# Patient Record
Sex: Female | Born: 1940
Health system: Southern US, Community
[De-identification: ages and names within clinical notes are randomized; demographics above are authoritative.]

## PROBLEM LIST (undated history)

## (undated) DIAGNOSIS — F0390 Unspecified dementia without behavioral disturbance: Secondary | ICD-10-CM

## (undated) DIAGNOSIS — K5790 Diverticulosis of intestine, part unspecified, without perforation or abscess without bleeding: Secondary | ICD-10-CM

## (undated) DIAGNOSIS — I10 Essential (primary) hypertension: Secondary | ICD-10-CM

## (undated) DIAGNOSIS — K559 Vascular disorder of intestine, unspecified: Secondary | ICD-10-CM

## (undated) DIAGNOSIS — N189 Chronic kidney disease, unspecified: Secondary | ICD-10-CM

## (undated) DIAGNOSIS — I639 Cerebral infarction, unspecified: Secondary | ICD-10-CM

## (undated) DIAGNOSIS — R4701 Aphasia: Secondary | ICD-10-CM

## (undated) DIAGNOSIS — I482 Chronic atrial fibrillation, unspecified: Secondary | ICD-10-CM

## (undated) DIAGNOSIS — I509 Heart failure, unspecified: Secondary | ICD-10-CM

## (undated) DIAGNOSIS — I519 Heart disease, unspecified: Secondary | ICD-10-CM

## (undated) DIAGNOSIS — N179 Acute kidney failure, unspecified: Secondary | ICD-10-CM

## (undated) DIAGNOSIS — E669 Obesity, unspecified: Secondary | ICD-10-CM

## (undated) DIAGNOSIS — D735 Infarction of spleen: Secondary | ICD-10-CM

## (undated) HISTORY — DX: Infarction of spleen: D73.5

## (undated) HISTORY — DX: Heart disease, unspecified: I51.9

## (undated) HISTORY — DX: Chronic atrial fibrillation, unspecified: I48.20

## (undated) HISTORY — DX: Acute kidney failure, unspecified: N17.9

## (undated) HISTORY — PX: ILEOSTOMY: SHX1783

## (undated) HISTORY — DX: Vascular disorder of intestine, unspecified: K55.9

## (undated) HISTORY — DX: Aphasia: R47.01

## (undated) HISTORY — PX: OMENTECTOMY: SHX2098

## (undated) HISTORY — DX: Obesity, unspecified: E66.9

## (undated) HISTORY — DX: Essential (primary) hypertension: I10

## (undated) HISTORY — DX: Chronic kidney disease, unspecified: N18.9

## (undated) HISTORY — DX: Diverticulosis of intestine, part unspecified, without perforation or abscess without bleeding: K57.90

## (undated) HISTORY — DX: Cerebral infarction, unspecified: I63.9

---

## 2000-05-22 ENCOUNTER — Encounter: Payer: Self-pay | Admitting: Family Medicine

## 2000-05-22 ENCOUNTER — Encounter: Admission: RE | Admit: 2000-05-22 | Discharge: 2000-05-22 | Payer: Self-pay | Admitting: *Deleted

## 2001-05-29 ENCOUNTER — Encounter: Admission: RE | Admit: 2001-05-29 | Discharge: 2001-05-29 | Payer: Self-pay | Admitting: *Deleted

## 2001-05-29 ENCOUNTER — Encounter: Payer: Self-pay | Admitting: *Deleted

## 2002-05-31 ENCOUNTER — Encounter: Admission: RE | Admit: 2002-05-31 | Discharge: 2002-05-31 | Payer: Self-pay | Admitting: *Deleted

## 2002-05-31 ENCOUNTER — Encounter: Payer: Self-pay | Admitting: *Deleted

## 2003-08-20 ENCOUNTER — Encounter: Payer: Self-pay | Admitting: *Deleted

## 2003-08-20 ENCOUNTER — Encounter: Admission: RE | Admit: 2003-08-20 | Discharge: 2003-08-20 | Payer: Self-pay | Admitting: *Deleted

## 2004-09-17 ENCOUNTER — Ambulatory Visit (HOSPITAL_COMMUNITY): Admission: RE | Admit: 2004-09-17 | Discharge: 2004-09-17 | Payer: Self-pay | Admitting: *Deleted

## 2005-04-07 ENCOUNTER — Other Ambulatory Visit: Admission: RE | Admit: 2005-04-07 | Discharge: 2005-04-07 | Payer: Self-pay | Admitting: *Deleted

## 2005-10-14 ENCOUNTER — Ambulatory Visit (HOSPITAL_COMMUNITY): Admission: RE | Admit: 2005-10-14 | Discharge: 2005-10-14 | Payer: Self-pay | Admitting: *Deleted

## 2006-10-25 ENCOUNTER — Ambulatory Visit (HOSPITAL_COMMUNITY): Admission: RE | Admit: 2006-10-25 | Discharge: 2006-10-25 | Payer: Self-pay | Admitting: *Deleted

## 2007-02-23 ENCOUNTER — Inpatient Hospital Stay (HOSPITAL_COMMUNITY): Admission: EM | Admit: 2007-02-23 | Discharge: 2007-03-22 | Payer: Self-pay | Admitting: Family Medicine

## 2007-02-23 ENCOUNTER — Ambulatory Visit: Payer: Self-pay | Admitting: Pulmonary Disease

## 2007-02-25 ENCOUNTER — Encounter (INDEPENDENT_AMBULATORY_CARE_PROVIDER_SITE_OTHER): Payer: Self-pay | Admitting: *Deleted

## 2007-02-28 ENCOUNTER — Encounter (INDEPENDENT_AMBULATORY_CARE_PROVIDER_SITE_OTHER): Payer: Self-pay | Admitting: *Deleted

## 2007-03-09 ENCOUNTER — Encounter (INDEPENDENT_AMBULATORY_CARE_PROVIDER_SITE_OTHER): Payer: Self-pay | Admitting: *Deleted

## 2007-03-21 ENCOUNTER — Ambulatory Visit: Payer: Self-pay | Admitting: Vascular Surgery

## 2007-03-21 ENCOUNTER — Encounter: Payer: Self-pay | Admitting: Vascular Surgery

## 2007-08-27 ENCOUNTER — Inpatient Hospital Stay (HOSPITAL_COMMUNITY): Admission: RE | Admit: 2007-08-27 | Discharge: 2007-09-01 | Payer: Self-pay | Admitting: General Surgery

## 2007-08-27 ENCOUNTER — Encounter (INDEPENDENT_AMBULATORY_CARE_PROVIDER_SITE_OTHER): Payer: Self-pay | Admitting: General Surgery

## 2007-11-19 ENCOUNTER — Ambulatory Visit (HOSPITAL_COMMUNITY): Admission: RE | Admit: 2007-11-19 | Discharge: 2007-11-19 | Payer: Self-pay | Admitting: *Deleted

## 2007-12-12 ENCOUNTER — Other Ambulatory Visit: Admission: RE | Admit: 2007-12-12 | Discharge: 2007-12-12 | Payer: Self-pay | Admitting: Family Medicine

## 2008-11-19 ENCOUNTER — Ambulatory Visit (HOSPITAL_COMMUNITY): Admission: RE | Admit: 2008-11-19 | Discharge: 2008-11-19 | Payer: Self-pay | Admitting: Family Medicine

## 2009-11-30 ENCOUNTER — Ambulatory Visit (HOSPITAL_COMMUNITY): Admission: RE | Admit: 2009-11-30 | Discharge: 2009-11-30 | Payer: Self-pay | Admitting: Family Medicine

## 2010-02-08 ENCOUNTER — Other Ambulatory Visit: Admission: RE | Admit: 2010-02-08 | Discharge: 2010-02-08 | Payer: Self-pay | Admitting: Family Medicine

## 2010-11-14 DIAGNOSIS — R4701 Aphasia: Secondary | ICD-10-CM

## 2010-11-14 HISTORY — DX: Aphasia: R47.01

## 2010-12-09 ENCOUNTER — Ambulatory Visit (HOSPITAL_COMMUNITY)
Admission: RE | Admit: 2010-12-09 | Discharge: 2010-12-09 | Payer: Self-pay | Source: Home / Self Care | Attending: Family Medicine | Admitting: Family Medicine

## 2010-12-15 DIAGNOSIS — R4701 Aphasia: Secondary | ICD-10-CM

## 2010-12-15 DIAGNOSIS — I639 Cerebral infarction, unspecified: Secondary | ICD-10-CM

## 2010-12-15 HISTORY — DX: Cerebral infarction, unspecified: I63.9

## 2010-12-15 HISTORY — DX: Aphasia: R47.01

## 2010-12-15 HISTORY — PX: COLECTOMY: SHX59

## 2010-12-25 ENCOUNTER — Emergency Department (HOSPITAL_COMMUNITY): Payer: Medicare Other

## 2010-12-25 ENCOUNTER — Inpatient Hospital Stay (HOSPITAL_COMMUNITY)
Admission: EM | Admit: 2010-12-25 | Discharge: 2010-12-28 | DRG: 062 | Disposition: A | Discharge status: Home / Self Care | Payer: Medicare Other | Attending: Neurology | Admitting: Neurology

## 2010-12-25 DIAGNOSIS — I10 Essential (primary) hypertension: Secondary | ICD-10-CM | POA: Diagnosis present

## 2010-12-25 DIAGNOSIS — Z7982 Long term (current) use of aspirin: Secondary | ICD-10-CM

## 2010-12-25 DIAGNOSIS — I4891 Unspecified atrial fibrillation: Secondary | ICD-10-CM | POA: Diagnosis present

## 2010-12-25 DIAGNOSIS — K559 Vascular disorder of intestine, unspecified: Secondary | ICD-10-CM | POA: Diagnosis present

## 2010-12-25 DIAGNOSIS — R4701 Aphasia: Secondary | ICD-10-CM | POA: Diagnosis present

## 2010-12-25 DIAGNOSIS — I6350 Cerebral infarction due to unspecified occlusion or stenosis of unspecified cerebral artery: Principal | ICD-10-CM | POA: Diagnosis present

## 2010-12-25 DIAGNOSIS — E669 Obesity, unspecified: Secondary | ICD-10-CM | POA: Diagnosis present

## 2010-12-25 LAB — COMPREHENSIVE METABOLIC PANEL
ALT: 17 U/L (ref 0–35)
AST: 25 U/L (ref 0–37)
Albumin: 3.6 g/dL (ref 3.5–5.2)
CO2: 26 mEq/L (ref 19–32)
Calcium: 9.1 mg/dL (ref 8.4–10.5)
Chloride: 104 mEq/L (ref 96–112)
GFR calc Af Amer: 44 mL/min — ABNORMAL LOW (ref >60)
GFR calc non Af Amer: 36 mL/min — ABNORMAL LOW (ref >60)
Sodium: 138 mEq/L (ref 135–145)
Total Bilirubin: 0.3 mg/dL (ref 0.3–1.2)

## 2010-12-25 LAB — GLUCOSE, CAPILLARY

## 2010-12-25 LAB — POCT I-STAT, CHEM 8
BUN: 22 mg/dL (ref 6–23)
Calcium, Ion: 1.04 mmol/L — ABNORMAL LOW (ref 1.12–1.32)
Chloride: 108 mEq/L (ref 96–112)
Creatinine, Ser: 1.5 mg/dL — ABNORMAL HIGH (ref 0.4–1.2)
Glucose, Bld: 112 mg/dL — ABNORMAL HIGH (ref 70–99)
HCT: 42 % (ref 36.0–46.0)

## 2010-12-25 LAB — DIFFERENTIAL
Eosinophils Absolute: 0.2 10*3/uL (ref 0.0–0.7)
Eosinophils Relative: 2 % (ref 0–5)
Lymphs Abs: 2.8 10*3/uL (ref 0.7–4.0)
Monocytes Absolute: 1.1 10*3/uL — ABNORMAL HIGH (ref 0.1–1.0)
Monocytes Relative: 14 % — ABNORMAL HIGH (ref 3–12)

## 2010-12-25 LAB — RAPID URINE DRUG SCREEN, HOSP PERFORMED
Barbiturates: NOT DETECTED
Cocaine: NOT DETECTED
Opiates: NOT DETECTED
Tetrahydrocannabinol: NOT DETECTED

## 2010-12-25 LAB — URINALYSIS, ROUTINE W REFLEX MICROSCOPIC
Bilirubin Urine: NEGATIVE
Nitrite: NEGATIVE
Specific Gravity, Urine: 1.025 (ref 1.005–1.030)
Urobilinogen, UA: 1 mg/dL (ref 0.0–1.0)
pH: 5 (ref 5.0–8.0)

## 2010-12-25 LAB — CBC
MCH: 32.8 pg (ref 26.0–34.0)
MCV: 99.8 fL (ref 78.0–100.0)
Platelets: 193 10*3/uL (ref 150–400)
RDW: 13.5 % (ref 11.5–15.5)

## 2010-12-25 LAB — CK TOTAL AND CKMB (NOT AT ARMC): Relative Index: 1.4 (ref 0.0–2.5)

## 2010-12-26 ENCOUNTER — Inpatient Hospital Stay (HOSPITAL_COMMUNITY): Payer: Medicare Other

## 2010-12-26 LAB — COMPREHENSIVE METABOLIC PANEL
ALT: 17 U/L (ref 0–35)
AST: 34 U/L (ref 0–37)
Alkaline Phosphatase: 143 U/L — ABNORMAL HIGH (ref 39–117)
BUN: 14 mg/dL (ref 6–23)
CO2: 24 mEq/L (ref 19–32)
CO2: 25 mEq/L (ref 19–32)
Calcium: 8.9 mg/dL (ref 8.4–10.5)
Calcium: 9 mg/dL (ref 8.4–10.5)
Chloride: 108 mEq/L (ref 96–112)
Creatinine, Ser: 0.97 mg/dL (ref 0.4–1.2)
GFR calc Af Amer: 45 mL/min — ABNORMAL LOW (ref >60)
GFR calc non Af Amer: 37 mL/min — ABNORMAL LOW (ref >60)
GFR calc non Af Amer: 57 mL/min — ABNORMAL LOW (ref >60)
Glucose, Bld: 120 mg/dL — ABNORMAL HIGH (ref 70–99)
Glucose, Bld: 98 mg/dL (ref 70–99)
Potassium: 4.1 mEq/L (ref 3.5–5.1)
Sodium: 140 mEq/L (ref 135–145)
Total Bilirubin: 0.6 mg/dL (ref 0.3–1.2)
Total Protein: 6.9 g/dL (ref 6.0–8.3)

## 2010-12-26 LAB — CBC
HCT: 41.4 % (ref 36.0–46.0)
HCT: 42.3 % (ref 36.0–46.0)
Hemoglobin: 13.6 g/dL (ref 12.0–15.0)
Hemoglobin: 14 g/dL (ref 12.0–15.0)
MCH: 32.9 pg (ref 26.0–34.0)
MCH: 32.9 pg (ref 26.0–34.0)
MCHC: 32.9 g/dL (ref 30.0–36.0)
MCHC: 33.1 g/dL (ref 30.0–36.0)
MCV: 100.2 fL — ABNORMAL HIGH (ref 78.0–100.0)
RDW: 13.6 % (ref 11.5–15.5)
RDW: 13.7 % (ref 11.5–15.5)

## 2010-12-26 LAB — TSH: TSH: 2.781 u[IU]/mL (ref 0.350–4.500)

## 2010-12-26 LAB — LIPID PANEL
Cholesterol: 142 mg/dL (ref 0–200)
HDL: 52 mg/dL (ref >39)
Total CHOL/HDL Ratio: 2.7 RATIO
VLDL: 14 mg/dL (ref 0–40)

## 2010-12-26 LAB — CK TOTAL AND CKMB (NOT AT ARMC): Total CK: 181 U/L — ABNORMAL HIGH (ref 7–177)

## 2010-12-26 LAB — PROTIME-INR: INR: 5.99 (ref 0.00–1.49)

## 2010-12-26 LAB — HEMOGLOBIN A1C
Hgb A1c MFr Bld: 6 % — ABNORMAL HIGH (ref <5.7)
Mean Plasma Glucose: 126 mg/dL — ABNORMAL HIGH (ref <117)

## 2010-12-26 LAB — ETHANOL: Alcohol, Ethyl (B): <5 mg/dL (ref 0–10)

## 2010-12-26 LAB — DIGOXIN LEVEL: Digoxin Level: <0.2 ng/mL — ABNORMAL LOW (ref 0.8–2.0)

## 2010-12-27 ENCOUNTER — Inpatient Hospital Stay (HOSPITAL_COMMUNITY): Payer: Medicare Other

## 2010-12-28 LAB — PROTIME-INR: Prothrombin Time: 13.9 seconds (ref 11.6–15.2)

## 2010-12-28 NOTE — H&P (Signed)
NAME:  Joanna [name] NO.:  [id number]  MEDICAL RECORD NO.:  [id number]           PATIENT TYPE:  I  LOCATION:  2901                         FACILITY:  [facility]  PHYSICIAN:  Marlan Palau, M.D.  DATE OF BIRTH:  May 28, 1941  DATE OF ADMISSION:  12/25/2010 DATE OF DISCHARGE:                             HISTORY & PHYSICAL   HISTORY OF PRESENT ILLNESS:  Joanna [name] is a 70 year old right- handed black female born on [birth year], with a history of atrial fibrillation, on aspirin only.  This patient comes to the [facility] after having a witnessed event while at work tonight at 9 p.m.  The patient was helping to transfer a patient and then suddenly slumped over.  The patient was noted to have speech disorder from that time on.  The patient was brought to the emergency room for an evaluation and appeared to have good strength in all fours, but had a severe dysnomia.  CT scan of the brain was done showing an old left frontal white matter ischemic event, but no acute changes were seen. The patient appears to have an NIH stroke scale score of 4.  The patient is being admitted to [facility] following full-dose IV tPA.  PAST MEDICAL HISTORY:  Significant for: 1. New onset of aphasia. 2. Atrial fibrillation, not on Coumadin. 3. History of ischemic colitis, requiring partial colectomy and     ileostomy with subsequent reversal of the ileostomy. 4. Hypertension. 5. Obesity.  MEDICATIONS: 1. Aspirin 325 mg daily. 2. Metoprolol 100 mg twice daily. 3. Lasix 80 mg daily. 4. Digoxin 0.125 mg daily.  ALLERGIES:  The patient has no known allergies.  SOCIAL HISTORY:  The patient is widowed, has 3 children who are alive and well.  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  The patient again has 3 children and 1 daughter who died with HIV infection.  She does not smoke or drink.  FAMILY MEDICAL HISTORY:  Notable that  mother died with heart disease. Cause of death of father is unknown.  The patient has two sisters, one with hypertension.  There is no history of cancer or diabetes in the family.  REVIEW OF SYSTEMS:  Notable that the patient does not report any headache, dizziness, chest pains, shortness of breath, abdominal pain, nausea, vomiting, troubles controlling the bowels or bladder.  The patient does not report numbness or weakness on the arms or legs.  PHYSICAL EXAMINATION:  VITAL SIGNS:  Blood pressure currently is 146/88, heart rate 83, and respiratory rate 20.  The patient is afebrile. GENERAL:  This patient is a minimally to moderately obese black female who is alert at the time of the examination. HEENT:  Head is atraumatic.  Eyes, pupils are round and reactive to light, equal. NECK:  Supple.  No carotid bruits noted. RESPIRATORY:  Clear. CARDIOVASCULAR:  Occasionally irregular heart rhythm without obvious murmurs or rubs noted. EXTREMITIES:  Without significant edema. ABDOMEN:  Positive bowel sounds.  No organomegaly or tenderness noted. NEUROLOGIC:  Cranial nerves as above.  Facial symmetry is present.  The patient has good sensation of face to pinprick and soft touch bilaterally.  She has good strength of facial muscle, muscles with head turning and shoulder shrug bilaterally.  Speech is well enunciated, but the patient has a fairly significant dysnomia.  The patient cannot read, cannot name objects, cannot repeat.  Again, motor testing reveals normal strength in all fours.  No drift is seen on the arms or legs.  The patient has good pinprick sensation on all fours.  The patient appears to have full extraocular movements.  Deep tendon reflexes are symmetric and normal.  Toes are downgoing bilaterally.  The patient has fair finger-nose-finger and heel-to-shin bilaterally.  Gait was not tested.  LABORATORY VALUES:  Notable for white count of 7.8, hemoglobin of 13.4, hematocrit of  40.7, MCV of 99.8, and platelets of 193.  INR of 0.97, sodium of 138, potassium of 4.0, chloride of 104, CO2 of 26, glucose of 113, BUN of 19, creatinine of 1.43, alk phosphatase of 137, SGOT of 25, SGPT of 17, total protein of 7.6, albumin of 3.6, calcium of 9.1.  CK of 162, MB fraction of 2.2, and troponin I of 0.01.  IMPRESSION: 1. New onset of dysnomia/aphasia. 2. Atrial fibrillation. 3. Hypertension.  This patient has not been on anticoagulant medications with her atrial fibrillation.  The patient has dysnomia that is fairly significant.  The patient has no hemiparesis or visual field deficits.  The patient will be brought into the hospital following full-dose IV tPA and will be admitted to the Intensive Care Unit.  The patient will undergo an MRI of the head, MRA of the head, 2-D echocardiogram, and carotid Doppler study.  May consider use of anticoagulants in the future.  I did not see this patient has any clear contraindications to this.     Marlan Palau, M.D.     CKW/MEDQ  D:  12/25/2010  T:  12/26/2010  Job:  [id number]  cc:   [facility]  Electronically Signed by Thana Farr M.D. on 12/28/2010 10:02:58 AM

## 2010-12-31 ENCOUNTER — Other Ambulatory Visit: Payer: Medicare Other

## 2010-12-31 ENCOUNTER — Other Ambulatory Visit (INDEPENDENT_AMBULATORY_CARE_PROVIDER_SITE_OTHER): Payer: Medicare Other

## 2010-12-31 DIAGNOSIS — I4891 Unspecified atrial fibrillation: Secondary | ICD-10-CM

## 2010-12-31 DIAGNOSIS — Z7901 Long term (current) use of anticoagulants: Secondary | ICD-10-CM

## 2011-01-07 ENCOUNTER — Other Ambulatory Visit (INDEPENDENT_AMBULATORY_CARE_PROVIDER_SITE_OTHER): Payer: Medicare Other

## 2011-01-07 DIAGNOSIS — Z7901 Long term (current) use of anticoagulants: Secondary | ICD-10-CM

## 2011-01-07 DIAGNOSIS — I4891 Unspecified atrial fibrillation: Secondary | ICD-10-CM

## 2011-01-13 NOTE — Discharge Summary (Signed)
NAME:  Joanna [name], Joanna [name]              ACCOUNT NO.:  [id number]  MEDICAL RECORD NO.:  [id number]           PATIENT TYPE:  I  LOCATION:  3032                         FACILITY:  [facility]  PHYSICIAN:  Duy Lemming P. Pearlean Brownie, MD    DATE OF BIRTH:  08/22/1941  DATE OF ADMISSION:  12/25/2010 DATE OF DISCHARGE:  12/28/2010                              DISCHARGE SUMMARY   DIAGNOSES AT TIME OF DISCHARGE: 1. Small left middle cerebral artery branch infarct not seen on MRI,     status post full-dose IV t-PA. 2. Atrial fibrillation. 3. Ischemic colitis requiring partial colectomy and ileostomy with     subsequent reversal of the ileostomy. 4. Hypertension. 5. Obesity.  MEDICATIONS AT TIME OF DISCHARGE: 1. Aspirin 325 mg until Coumadin therapeutic, then discontinue. 2. Warfarin 5 mg q.p.m., adjust dose per Dr. Swaziland. 3. Digoxin 0.125 mg a day. 4. Lasix 80 mg a day. 5. Metoprolol 50 mg b.i.d. 6. Multivitamin one a day.  STUDIES PERFORMED: 1. CT of the brain on admission shows interval, but chronic left     subcortical parietal stroke.  No acute infarct or intracranial     hemorrhage.  Followup CT 24 hours after t-PA shows no significant     change.  No intracranial hemorrhage. 2. MRI of the brain shows no acute stroke.  Remote left frontal     subcortical white matter infarct. 3. MRA shows mild intracranial atherosclerotic changes. 4. Carotid Doppler shows no ICA stenosis. 5. A 2-D echocardiogram shows EF of 50-55% with no source of embolus. 6. EKG shows atrial fibrillation.  LABORATORY STUDIES:  Glucose 112, creatinine 1.5, otherwise chemistry normal, CBC normal, coagulation studies normal and liver function tests normal.  Cardiac enzymes negative.  Urine drug screen negative. Cholesterol 142, triglycerides 69, HDL 52, LDL 76.  Digoxin less than 0.2, hemoglobin A1c 6.0, alcohol level less than 5, TSH 2.781, INR day of discharge 1.05.  HISTORY OF PRESENT ILLNESS:  Joanna [name] is a  70 year old right- handed African American female with a history of atrial fibrillation on aspirin alone.  The patient presents to [facility] Room after having a witnessed event while at work at 9 p.m.  The patient was helping to transfer a patient when suddenly she slumped over.  The patient she was helping called for help.  Ms. Dona was noted to have a speech disorder from that time forward.  She was brought to the emergency room for evaluation.  She had good strength in all 4 extremities, but had a severe dysnomia.  CT of the brain showed an old left frontal white matter ischemic infarct with no acute changes.  The patient scored a 4 on the NIH stroke scale.  She was administered full- dose IV t-PA and admitted to the ICU.  HOSPITAL COURSE:  It is felt the patient had a small left MCA branch infarct not seen on MRI.  The cause of her infarct was atrial fibrillation.  As she has a history of atrial fibrillation not on Coumadin, it was decided to place her on warfarin for secondary stroke prevention.  No heparin or Lovenox  bridging is required.  Will  administer warfarin and follow up with Dr. Swaziland, her cardiologist, for outpatient management.  In hospital, she had workup.  She has had risk factors of hypertension and obesity.  Of note, she had a TEE in April 2008, where she was found to have a patent foramen ovale and right- to-left shunt.  The patient was also placed on aspirin.  Planned to bridge with aspirin and discontinue once Coumadin therapeutic.  She was evaluated by PT and OT and felt to have no outpatient needs.  She is safe for discharge home with family and will follow up with Dr. Swaziland in his office on Friday.  CONDITION AT TIME OF DISCHARGE:  The patient alert and oriented x3. Speech clear.  No aphasia.  She has no focal weakness.  Her gait is steady.  Heart rate is irregular.  Breath sounds are clear.  DISCHARGE PLAN: 1. Discharged home with family. 2.  Coumadin for secondary stroke prevention.  Bridge with aspirin     until INR 2.0, then discontinue.  Coumadin to be managed by Dr.     Peter Swaziland.  Has appointment on Friday, December 31, 2010, at     9:30 a.m. 3. Aspirin. 4. Follow up primary care physician within 85-month. 5. Follow up Dr. Delia Heady in his office in 2-3 months. 6. Consider IRIS trial.     Annie Main, N.P.   ______________________________ Sunny Schlein. Pearlean Brownie, MD    SB/MEDQ  D:  12/28/2010  T:  12/29/2010  Job:  [id number]  cc:   Cobie Leidner P. Pearlean Brownie, MD Peter M. Swaziland, M.D. [facility]  Electronically Signed by Annie Main N.P. on 01/03/2011 04:41:36 PM Electronically Signed by Delia Heady MD on 01/13/2011 01:18:29 PM

## 2011-01-14 ENCOUNTER — Ambulatory Visit (INDEPENDENT_AMBULATORY_CARE_PROVIDER_SITE_OTHER): Payer: Medicare Other | Admitting: Nurse Practitioner

## 2011-01-14 DIAGNOSIS — I6350 Cerebral infarction due to unspecified occlusion or stenosis of unspecified cerebral artery: Secondary | ICD-10-CM

## 2011-01-14 DIAGNOSIS — I4891 Unspecified atrial fibrillation: Secondary | ICD-10-CM

## 2011-01-14 DIAGNOSIS — I10 Essential (primary) hypertension: Secondary | ICD-10-CM

## 2011-01-14 DIAGNOSIS — E669 Obesity, unspecified: Secondary | ICD-10-CM

## 2011-01-14 DIAGNOSIS — Z7901 Long term (current) use of anticoagulants: Secondary | ICD-10-CM

## 2011-01-28 ENCOUNTER — Ambulatory Visit (INDEPENDENT_AMBULATORY_CARE_PROVIDER_SITE_OTHER): Payer: Medicare Other | Admitting: Nurse Practitioner

## 2011-01-28 DIAGNOSIS — G459 Transient cerebral ischemic attack, unspecified: Secondary | ICD-10-CM

## 2011-01-28 DIAGNOSIS — Z7901 Long term (current) use of anticoagulants: Secondary | ICD-10-CM

## 2011-01-28 DIAGNOSIS — I4891 Unspecified atrial fibrillation: Secondary | ICD-10-CM

## 2011-01-28 DIAGNOSIS — I10 Essential (primary) hypertension: Secondary | ICD-10-CM

## 2011-02-04 ENCOUNTER — Other Ambulatory Visit (INDEPENDENT_AMBULATORY_CARE_PROVIDER_SITE_OTHER): Payer: Medicare Other | Admitting: *Deleted

## 2011-02-04 DIAGNOSIS — I4891 Unspecified atrial fibrillation: Secondary | ICD-10-CM

## 2011-02-04 DIAGNOSIS — Z7901 Long term (current) use of anticoagulants: Secondary | ICD-10-CM

## 2011-02-04 DIAGNOSIS — Z79899 Other long term (current) drug therapy: Secondary | ICD-10-CM

## 2011-02-16 ENCOUNTER — Ambulatory Visit (INDEPENDENT_AMBULATORY_CARE_PROVIDER_SITE_OTHER): Payer: Medicare Other | Admitting: *Deleted

## 2011-02-16 DIAGNOSIS — Z7901 Long term (current) use of anticoagulants: Secondary | ICD-10-CM

## 2011-02-16 DIAGNOSIS — I4891 Unspecified atrial fibrillation: Secondary | ICD-10-CM

## 2011-02-16 LAB — POCT INR: INR: 1.7

## 2011-03-02 ENCOUNTER — Encounter: Payer: Medicare Other | Admitting: *Deleted

## 2011-03-02 ENCOUNTER — Ambulatory Visit (INDEPENDENT_AMBULATORY_CARE_PROVIDER_SITE_OTHER): Payer: Medicare Other | Admitting: *Deleted

## 2011-03-02 DIAGNOSIS — I4891 Unspecified atrial fibrillation: Secondary | ICD-10-CM

## 2011-03-02 LAB — POCT INR: INR: 3.5

## 2011-03-16 ENCOUNTER — Other Ambulatory Visit (INDEPENDENT_AMBULATORY_CARE_PROVIDER_SITE_OTHER): Payer: Medicare Other | Admitting: *Deleted

## 2011-03-16 DIAGNOSIS — I4891 Unspecified atrial fibrillation: Secondary | ICD-10-CM

## 2011-03-16 LAB — POCT INR: INR: 2.2

## 2011-03-29 NOTE — Op Note (Signed)
NAMEDAY, GREB NO.:  [id number]   MEDICAL RECORD NO.:  [id number]          PATIENT TYPE:  INP   LOCATION:  2899                         FACILITY:  [facility]   PHYSICIAN:  Gabrielle Dare. Janee Morn, M.D.DATE OF BIRTH:  Jul 28, 1941   DATE OF PROCEDURE:  08/27/2007  DATE OF DISCHARGE:                               OPERATIVE REPORT   PREOPERATIVE DIAGNOSIS:  Ileostomy.   POSTOPERATIVE DIAGNOSIS:  Ileostomy.   PROCEDURE:  1. Ileostomy takedown.  2. Lysis of adhesions for 30 minutes.   SURGEON:  Gabrielle Dare. Janee Morn, M.D.   ASSISTANT:  Currie Paris, M.D.   ANESTHESIA:  General.   HISTORY OF PRESENT ILLNESS:  The patient is a 70 year old African  American female who initially had an emergency right colectomy and  partial omentectomy with ileostomy in March 09, 2007 for necrotic  perforated right colon.  She has been doing well as an outpatient.  We  waited 6 months for hopefully her adhesions to decrease inside her  abdomen.  She underwent preoperative cardiac clearance with Dr. Reyes Ivan  and she presents today for elective takedown of her ileostomy. She  underwent a bowel prep which she tolerated well   PROCEDURE IN DETAIL:  Informed consent was obtained.  The patient was  identified in the preop holding area.  She received intravenous  antibiotics.  She was brought to the operating room.  General anesthesia  was administered.  Her ileostomy was then closed with running 2-0 silk  suture.  Her abdomen was subsequently prepped and draped in sterile  fashion.  Midline scar was excised.  Subcutaneous tissues were dissected  down to the anterior fascia.  This was carefully divided.  Old suture  was removed from the midline.  Peritoneal cavity was entered gradually  under direct vision as there were a large number of adhesions and  gradually freed up the fascia.  The fascia was divided the length of the  incision.  There was good deal of omental adhesions inside that  were  gradually taken down as well as some extensive adhesions from the small  bowel to the omentum and the colon.  These were methodically taken down  without any bowel injuries for total of 30 minutes.  Once these were  cleared, the NG tube was checked and adjusted and noted to be in good  position.  The ileostomy site from the inside was cleared away from the  adhesions at the abdominal wall.  The colon that remained was the  transverse colon and it was very viable and all the adhesions had been  cleared away.  The ileostomy site was then taken down with elliptical  incision from the skin.  Subcutaneous tissues were dissected  circumferentially around it, getting excellent hemostasis with Bovie  cautery.  The ileostomy was then brought back into the abdomen.  The  distal 5 cm or so was then divided and removed with the GIA-75 stapler.  The terminal ileum was pink and viable without inflammation.  We then  reconnected the bowel with a side-to-side anastomosis with a GIA-75  stapler after placing a  stay stitch with 2-0 silk.  Staple lines were  inspected and there was no bleeding.  The resultant enterotomy was then  closed with a TA-60 stapler, achieving excellent closure and good patent  anastomosis. An additional figure-of-eight 3-0 silk was placed along the  TA staple line.  We did get good hemostasis.  Anastomosis was widely  patent.  The rent in the mesentery was then closed with several  interrupted 2-0 silk sutures.  We changed our gloves.  The abdomen was  copiously irrigated.  Hemostasis was obtained in a couple places along  the omentum earlier in the case and these were rechecked and hemostasis  was assured.  The abdomen was further irrigated with warm saline.  The  irrigation fluid returned clear.  The irrigation fluid was evacuated.  The fascia of the ileostomy site was then closed with interrupted #1 PDS  suture, achieving good closure.  The midline fascia was then closed   after ensuring all counts were correct with #1 looped PDS, one from each  end of the incision and tied in the middle.  Subcutaneous tissues of the  ileostomy site in the midline were copiously irrigated.  Hemostasis was  again ensured and the skin of each was closed with staples.  Sponge,  needle and instrument counts were correct.  Sterile dressings were  applied.  The patient tolerated the procedure well without apparent  complication and was taken to recovery in stable condition.      Gabrielle Dare Janee Morn, M.D.  Electronically Signed     BET/MEDQ  D:  08/27/2007  T:  08/28/2007  Job:  [id number]   cc:   Cassell Clement, M.D.  Elmore Guise., M.D.  Evlyn Clines

## 2011-03-29 NOTE — Discharge Summary (Signed)
NAME:  Joanna [name], Joanna [name]              ACCOUNT NO.:  [id number]   MEDICAL RECORD NO.:  [id number]          PATIENT TYPE:  INP   LOCATION:  5736                         FACILITY:  [facility]   PHYSICIAN:  Gabrielle Dare. Janee Morn, M.D.DATE OF BIRTH:  25-Mar-1941   DATE OF ADMISSION:  08/27/2007  DATE OF DISCHARGE:  09/01/2007                               DISCHARGE SUMMARY   DISCHARGE DIAGNOSES:  1. Status post takedown of ileostomy.  2. Hypertension.   HISTORY OF PRESENT ILLNESS:  The patient is a 70 year old African  American female who initially had emergency right colectomy and partial  omentectomy with ileostomy in April 2008 for a necrotic perforated right  colon.  She presents for elective takedown of ileostomy.  She had  undergone a cardiac clearance.   HOSPITAL COURSE:  The patient underwent an uncomplicated ileostomy  takedown.  Postoperatively, she had the expected ileus.  Hypertension  was controlled with diltiazem and metoprolol.  Her ileus gradually  resolved.  NG tube was taken out postoperative day #1.  She continued  just on sips of water until bowel function returned with liquid bowel  movements by postoperative day #3.  She then tolerated gradual  advancement of her diet.  She remained afebrile and hemodynamically  stable throughout.  She continued to move her bowels and tolerated  advancement of her diet to soft.  Hypertension was controlled with her  diltiazem and metoprolol.  These were transitioned over to oral  medications and she is discharged home today on postoperative day #5 in  stable condition.   DISCHARGE DIET:  Soft.   DISCHARGE ACTIVITY:  No lifting.   DISCHARGE MEDICATIONS:  Percocet 5/325 one to two every 6 hours as  needed for pain.   In addition, she is continuing her home medications which consist of:  1. Metoprolol 100 mg p.o. b.i.d.  2. Os-Cal supplement.  3. Diltiazem 90 mg p.o. b.i.d.  4. Lasix 80 mg daily.  5. Centrum multivitamin.  6. Digoxin  0.125 mg daily.   Followup is with myself this coming week for staple removal.      Gabrielle Dare. Janee Morn, M.D.  Electronically Signed     BET/MEDQ  D:  09/01/2007  T:  09/03/2007  Job:  [id number]   cc:   Cassell Clement, M.D.  Evlyn Clines

## 2011-03-30 ENCOUNTER — Ambulatory Visit (INDEPENDENT_AMBULATORY_CARE_PROVIDER_SITE_OTHER): Payer: Medicare Other | Admitting: *Deleted

## 2011-03-30 DIAGNOSIS — I4891 Unspecified atrial fibrillation: Secondary | ICD-10-CM

## 2011-03-30 LAB — POCT INR: INR: 2

## 2011-04-01 NOTE — Discharge Summary (Signed)
NAME:  Joanna [name], Joanna [name] NO.:  [id number]   MEDICAL RECORD NO.:  [id number]          PATIENT TYPE:  INP   LOCATION:  3730                         FACILITY:  [facility]   PHYSICIAN:  Elmore Guise., M.D.DATE OF BIRTH:  February 22, 1941   DATE OF ADMISSION:  02/23/2007  DATE OF DISCHARGE:  03/22/2007                               DISCHARGE SUMMARY   DISCHARGE DIAGNOSES:  1. Atrial fibrillation.  2. Splenic infarct.  3. Acute renal failure, now with normalization back to baseline.  4. Ischemic colitis, status post emergent colectomy.  5. Gastrointestinal bleed, likely secondary to ischemic colitis.  6. Deconditioning.   HISTORY OF PRESENT ILLNESS AND HOSPITAL COURSE:  Ms. Swarey is a very  pleasant African American female who was admitted with abdominal  distention and atrial fibrillation back on April 11.  Her hospital  course was prolonged and complicated.  Because of her abdominal  distention, she underwent a CT of the abdomen and pelvis which showed  splenic infarct; this was felt secondary to embolic phenomenon from her  atrial fibrillation.  She was then placed on anticoagulation.  She was  rate-controlled with Cardizem drip, metoprolol and amiodarone and  actually converted to normal sinus rhythm after 2 days of IV  medications.  She did have elevation in her liver enzymes, which was  consistent with shock liver state.  All medications which would cause  hepatic insufficiency were stopped (Tylenol, amiodarone).  The patient also developed acute renal failure with increased BUN and  creatinine, requiring high-dose Lasix for volume improvement.  The  patient's status improved slowly over the next 5-6 days and she was  transferred from the CCU to the regular telemetry floor.  Her strength  improved slowly; however, she continued to have episodes of off-and-on  diarrhea and abdominal distention.  Her LFTs were normalizing.  She had  a significant GI bleed in the  setting of being on Coumadin and Lovenox.  Her anticoagulation was stopped and she was reversed with FFP.  Ultimately, she required over 6 units of FFP as well as 8 units of  packed red blood cells to maintain her hemoglobin.  Because of her  worsening mental status and her hemodynamic instability, she was  transferred to the ICU for aggressive medical care.  Critical Care  consult was obtained and because of the patient's work of breathing, she  was also intubated.  Her condition continued to remain labile.  Because  of continued abdominal discomfort, increased lactic acid and worsening  acidotic picture, she was taken to the operating room emergently for  emergent colectomy.  Postoperatively, she did well.  She was treated  with IV nutrition and TNA.  Her strength improved slowly.  After a 7-day  stay in the ICU, she was transferred back to telemetry monitoring.  She  was treated aggressively with IV antibiotics including Primaxin and  vancomycin.  Blood cultures and urine cultures were negative; however,  respiratory culture did grow out E. coli and is presumed secondary to  aspiration.  After 7 days of IV antibiotics, the patient was then placed  on  p.o. Avelox.  She has now doing very well with no further fever.  Her  hemoglobin has remained stable in the 8.7 to 8.9 range.  Her kidney  function has normalized.  Her LFTs are normalizing also.  She has been  tolerating PT and OT daily.  She will be discharged to the nursing home  today to continue her rehab until she can go home.  She is not an  anticoagulation candidate at this time because of her recent and  profound GI bleeding; we feel that at this time she is best treated with  aspirin.  Her heart rate has been well-controlled over the last 5 days  on her current medications.  She will be discharged to the nursing home  today to continue:   DISCHARGE MEDICATIONS:  1. Aspirin 325 mg daily.  2. Digoxin 0.25 mg.  3. Cardizem CD  180 mg twice daily.  4. Protonix 40 mg daily.  5. Metoprolol 75 mg twice daily.  6. Lasix 40 mg daily.  7. Xopenex MDI q.6 h. p.r.n. shortness of breath or wheezing.   WOUND CARE:  Her midline abdominal incision is improving.  She is to use  packing over small areas that have some breakdown and cover with dry  dressing daily.   FOLLOWUP:  She will return for a followup appointment with Dr. Reyes Ivan in  1-2 weeks and follow up with Dr. Janee Morn with [facility]  in 2 weeks.  She is to call the office should she have any further  problems with heart rate, abdominal pain, or swelling issues.  She will  follow up with Dr. Ewing Schlein for routine GI followup after 4 weeks.      Elmore Guise., M.D.  Electronically Signed     TWK/MEDQ  D:  03/22/2007  T:  03/22/2007  Job:  [id number]

## 2011-04-01 NOTE — Consult Note (Signed)
[name], [name] NO.:  [id number]   MEDICAL RECORD NO.:  [id number]          PATIENT TYPE:  INP   LOCATION:  2912                         FACILITY:  [facility]   PHYSICIAN:  Maree Krabbe, M.D.DATE OF BIRTH:  26-Jul-1941   DATE OF CONSULTATION:  02/25/2007  DATE OF DISCHARGE:                                 CONSULTATION   REASON FOR CONSULTATION:  Acute renal failure, shortness of breath.   HISTORY:  The patient is a 70 year old African-American female with only  a 2-year history of hypertension.  No medical problems prior to that.  She is obese.  No prior history of renal disease.  She was admitted 3  days ago with new atrial fibrillation and rapid ventricular rate. She  was found to have marked cardiomegaly on chest x-ray but no congestive  heart failure.  She had a normal creatinine of 1.0.  About 36 hours ago  she had abdominal CT with IV contrast 100 mL done for abdominal pain.  This showed ascites and a splenic infarct which was thought to be  related to her atrial fibrillation and clot embolus.   Liver enzymes have been rising including a bilirubin that is up to the 3-  4 range, and this is felt to be due to hepatic congestion which is  apparently present on the CT also. Her creatinine has risen over the  last 48 hours up to 2.6 today, and the urine output has dropped  significantly.  Today she developed dyspnea during the afternoon which  was not present this morning, and this is moderate in severity.  An  echocardiogram is being done at the bedside which unofficially shows LV  function which is close to normal, a large right ventricle and  significant tricuspid regurgitation.   PAST MEDICAL HISTORY:  1. Hypertension.  2. Obesity.   MEDICATIONS ON ADMISSION:  Hydrochlorothiazide and Norvasc.   ALLERGIES:  None.   FAMILY HISTORY:  No kidney disease.   SOCIAL HISTORY:  No tobacco or alcohol.  She is widowed.   REVIEW OF SYSTEMS:  GENERAL:   Denies fever, chills, sweats.  ENT:  Denies hearing loss, visual change.  CARDIORESPIRATORY:  As above.  GI:  No nausea, vomiting, diarrhea.  GU:  No difficulty voiding.  She has a  Foley catheter in place.  MUSCULOSKELETAL:  No myalgia or arthralgia.  NEUROLOGIC:  No focal numbness or weakness.   PHYSICAL EXAMINATION:  VITAL SIGNS:  Blood pressure 130/80, heart rate  100 and in atrial fibrillation.  O2 saturation 97% on nasal oxygen at 3  liters.  Temperature 98.  SKIN:  Warm, dry.  HEENT:  PERRLA.  EOMI.  Throat is clear.  NECK:  Supple with distended neck veins to the angle of the jaw.  CHEST:  Reveals some faint bibasilar crackles but mostly clear with no  wheezing and good air movement.  CARDIAC:  Irregular rate and rhythm without rub or gallop.  ABDOMEN:  Soft, mildly tender in the right upper quadrant and left upper  quadrant, obese. No bruits.  EXTREMITIES:  2+ pitting edema in the feet  and ankles bilaterally.  This  is apparently new over the past week according to the family.  NEUROLOGICAL:  Alert and oriented x3.   LABORATORY DATA:  Admission creatinine was 1.0, was up to 1.8 yesterday  evening and 2.6 today. BUN 34, bicarb 19, sodium 133, potassium 4.1,  chloride 95. White blood count 17,000, hemoglobin 11.  Alkaline  phosphatase 220, AST 496, ALT 202, bilirubin elevated at 4.9.  Urinalysis shows concentrated urine, greater than 1.046, 15 ketones, 100  protein, negative nitrate, negative leukocytes.  Microscopic shows  granular casts.   Chest x-ray:  Marked cardiomegaly, no overt edema, positive vascular  congestion.   CT of the abdomen done earlier this admission shows ascites, splenic  infarct, and hepatic congestion. The kidneys were normal on that study.   EKG:  Atrial fibrillation.   IMPRESSION:  1. Acute renal failure due to acute tubular necrosis from contrast      nephropathy.  She has granular casts and oliguric acute renal      failure.  She probably has  some hemodynamic problems related to her      cardiac disease which is as yet undefined.  She has very      significant cardiomegaly and looks to have some predominantly right-      sided heart failure.  Whether she has obesity hypoventilation and      chronic right-sided pulmonary hypertension, I do not now. She says      she only has had hypertension for 2 years, but if this could have      been going on longer and developed diastolic dysfunction is another      possibility.  Regardless, I do not think the renal failure has any      correlation to the splenic infarct or is it in any way related to      embolization of cholesterol or clot.  2. Hypertension of 2 years duration.  3. Atrial fibrillation, new diagnosis.  4. Splenic infarct, probable clot embolus.  5. Volume excess with edema and mild to moderate congestive heart      failure.  6. Marked cardiomegaly, uncertain cause.  7. Increase in liver enzymes.   RECOMMENDATIONS:  1. Avoid nephrotoxins.  2. High-dose IV Lasix and IV nitroglycerin for treatment of CHF.  3. Stop all IV fluids and fluid restrict.  4. May possibly need dialysis if no better with the above measures,      but hopefully not.      Maree Krabbe, M.D.  Electronically Signed     RDS/MEDQ  D:  02/25/2007  T:  02/25/2007  Job:  [id number]

## 2011-04-01 NOTE — Op Note (Signed)
NAMEMONTRICE, MONTUORI NO.:  [id number]   MEDICAL RECORD NO.:  [id number]          PATIENT TYPE:  INP   LOCATION:  2108                         FACILITY:  [facility]   PHYSICIAN:  Gabrielle Dare. Janee Morn, M.D.DATE OF BIRTH:  1941-10-20   DATE OF PROCEDURE:  03/09/2007  DATE OF DISCHARGE:                               OPERATIVE REPORT   PREOPERATIVE DIAGNOSIS:  Dead bowel.   POSTOPERATIVE DIAGNOSIS:  Necrotic segment right colon.   PROCEDURE:  1. Right colectomy.  2. Ileostomy.  3. Partial omentectomy.   SURGEON:  Violeta Gelinas, M.D.   ASSISTANT:  Wilmon Arms. Tsuei, M.D.   ANESTHESIA:  General.   HISTORY OF PRESENT ILLNESS:  Ms. Biagini is a 70 year old African American  female who I saw late last night for lower GI bleed.  She developed  progressive acidosis despite resuscitation with an elevated lactate  level with a significant concern for dead bowel.  She was resuscitated  with fresh frozen plasma to try and correct her coagulopathy.  She was  also given vitamin K.  I spoke in detail with the family about her grave  situation and need for emergent exploration and they agreed as they  wanted to do everything possible to help her get better so she was  brought emergently to the operating room.   PROCEDURE IN DETAIL:  After informed consent from family, the patient  was brought straight to the operating room from the intensive care unit.  General anesthesia was administered by anesthesia.  Her abdomen was  prepped and draped in sterile fashion.  Midline incision was made in the  upper portion and carried down below the umbilicus.  The subcutaneous  tissues were dissected down to the linea alba.  This was divided and the  peritoneal cavity was entered under direct vision.  The small bowel was  explored first and was viable.  There was run from ligament Treitz down  to the terminal ileum.  There is no necrotic small bowel.  It was mildly  dilated but viable.  The  right colon was then explored.  The cecum was  viable.  There was a portion of omentum stuck on the mid-right colon.  The remainder of the transverse colon,  left colon, sigmoid colon and  rectum were all viable.  Stomach was viable.  There was some straw-  colored peritoneal fluid without obvious feculent contamination.  Attention was redirected to the area of omentum stuck on the descending.  This was gradually dissected away revealing a necrotic segment of right  colon.  The omentum had tried to contain it from perforating.  That  section of the omentum was then excised using the LigaSure getting  excellent hemostasis.  That portion of the omentum was sent to pathology  decision was then made to do a right colectomy due to this extensive  necrotic segment.  The cecum and right colon were mobilized from the  retroperitoneal attachments by using Bovie cautery along the line of  Toldt.  The mobilization was continued up to include the hepatic flexure  around to the transverse colon.  The duodenum was swept away carefully  and hemostasis was obtained along the retroperitoneum there.  The  transverse colon was divided with the GIA 75 stapler after clearing away  the omentum using the LigaSure.  The terminal ileum was also divided  with the GIA 75 stapler.  The mesentery was then divided gradually with  the LigaSure getting excellent hemostasis and specimen was sent to  pathology.  The staple line on the transverse colon was inspected.  There was good hemostasis and that portion remained viable.  The  mesentery of the terminal ileum was trimmed back for a short segment  with the LigaSure to facilitate creation of ileostomy. the abdomen was  then copiously irrigated.  Meticulous hemostasis was ensured especially  along the retroperitoneal dissection.  There were few small bleeding  veins and there were cauterized getting excellent hemostasis and the  site for the ileostomy was chosen in the  right upper quadrant due to the  patient's abdominal fold.  A circular incision was made with Bovie and  the fat was cored out down the fascia.  A cruciate incision was made in  the fascia, holding the fascia to the midline with Kocher.  Opening was  made and the terminal ileum was passed through without difficulty.  The  opening was large enough to fit two fingers and the terminal ileum  remained pink.  Abdominal contents were returned to their anatomic  position.  NG tube was confirmed in position.  Omentum was laid down  over top of small bowel.  A piece of Seprafilm was placed along the  midline.  The abdomen was then closed by closing the fascia with #1  Novofil, one from each end and tied in the middle.  Subcutaneous tissues  were irrigated and the skin was closed loosely with staples.  The end of  the ileostomy was then a little bit dusky, so about 6 cm were trimmed  back and the ileostomy was matured with 3-0 Vicryl sutures.  It bled  readily and was viable.  Hemostasis was ensured with Bovie cautery.  An  ostomy appliance was placed, sterile dressings were applied.  The  patient tolerated the procedure well without apparent complication.  She  did receive 2 units of packed red blood cells in the OR.  She was taken  straight back to intensive care unit in critical condition.      Gabrielle Dare Janee Morn, M.D.  Electronically Signed     BET/MEDQ  D:  03/09/2007  T:  03/09/2007  Job:  [id number]   cc:   Fayrene Fearing L. Malon Kindle., M.D.  Elmore Guise., M.D.  Cassell Clement, M.D.  Barbaraann Share, MD,FCCP

## 2011-04-01 NOTE — H&P (Signed)
NAME:  Joanna [name], Joanna [name] NO.:  [id number]   MEDICAL RECORD NO.:  [id number]          PATIENT TYPE:  INP   LOCATION:  3703                         FACILITY:  [facility]   PHYSICIAN:  Elmore Guise., M.D.DATE OF BIRTH:  03/14/1941   DATE OF ADMISSION:  02/23/2007  DATE OF DISCHARGE:                              HISTORY & PHYSICAL   INDICATION FOR ADMISSION:  Atrial fibrillation with rapid ventricular  response.   PRIMARY CARE PHYSICIAN:  Dr. Janey Greaser.   HISTORY OF PRESENT ILLNESS:  Ms. Hight is a very pleasant 70 year old  African American female with a past medical history of hypertension,  chronic constipation who presents for evaluation of shortness of breath  and abdominal distension.  The patient reports decreased p.o. intake for  the last couple of weeks.  She has also noticed increased abdominal  bloating and really no bowel movement for approximately 10 days.  She  does report some flatus but no fever.  She has also noted nonproductive  cough over the last 3 days.  She has been complaining of orthopnea, PND,  but no chest pain.  She has also had progressive dyspnea and decreased  urine output.  Her breathing worsened.  She went to Urgent Care where  she was found to be in atrial fibrillation with rapid ventricular  response, heart rate in the 160-170s.  She was then sent to the  emergency room for further evaluation.  Once here in the emergency room,  the patient was given Cardizem 20 mg IV bolus x2 with little change in  her heart rate.  Cardiology consult was obtained.  The patient then was  also given adenosine 6 and 12 mg, showing that her heart rate was indeed  in atrial fibrillation.  She is to be admitted for further evaluation  and treatment.  The patient denies any increased use of nonsteroidals or  Tylenol.  Overall, she states that she has been doing okay except for  just abdominal bloating and now new onset of shortness of breath.   CURRENT  MEDICATIONS:  Hydrochlorothiazide, Norvasc, Amitiza (started for  her constipation).   ALLERGIES:  NONE.   FAMILY HISTORY:  Noncontributory.  She has no early history of heart  disease in her family.   SOCIAL HISTORY:  No tobacco and no alcohol.   PHYSICAL EXAMINATION:  VITAL SIGNS:  Blood pressure was 122/68 on a  Cardizem drip at 5 mg per hour.  She is afebrile.  Temperature is 99.2,  heart rate 150-160 showing atrial fibrillation with rapid ventricular  response, and her O2 saturation is 95%.  GENERAL:  She is a very pleasant middle-aged [place] American female,  alert and oriented x4, in no acute distress.  NECK:  She has no JVD.  LUNGS:  She does have bibasilar crackles.  HEART:  Irregular and tachycardiac.  ABDOMEN:  Soft but distended, mildly tender, with decreased bowel  sounds.  EXTREMITIES:  1+ edema.   LABORATORY DATA:  White count of 11.0, hemoglobin of 11.2, platelet  count 299.  Her coags were normal at 16.4, 1.3 and 31.  Her  BUN and  creatinine were 20 and 0.9.  Cardiac markers showed a myoglobin of 127,  CPK-MB of 4.6, Troponin I 0.38, and BNP level of 884.   Chest x-ray showed mild volume overload and cardiomegaly.  Her ECG  showed atrial fibrillation with rapid ventricular response and  nonspecific ST-T wave changes.   IMPRESSION:  1. Atrial fibrillation with rapid ventricular response of unknown      duration, likely 3+ days.  2. Abdominal distension with symptoms of constipation.  3. History of hypertension.   PLAN:  1. From a cardiovascular standpoint, patient will be admitted to      telemetry monitoring.  We will increase her Cardizem drip and start      amiodarone for rate control.  She will also be started on heparin      as well as aspirin.  2. For her abdominal pain and distension, she will have acute      abdominal series.  We will also check amylase, lipase, and LFTs.      If her symptoms continue, she may need a CT scan of her abdomen as       well as a GI consult.  She will also be n.p.o. for the time being.      If her acute abdominal series does not show any significant      pathology, we will also try a Fleet's enema to see if we can have      some success.      Elmore Guise., M.D.  Electronically Signed     TWK/MEDQ  D:  02/25/2007  T:  02/25/2007  Job:  [id number]

## 2011-04-01 NOTE — Consult Note (Signed)
NAMETHORA, [name] NO.:  [id number]   MEDICAL RECORD NO.:  [id number]          PATIENT TYPE:  INP   LOCATION:  2108                         FACILITY:  [facility]   PHYSICIAN:  Gabrielle Dare. Janee Morn, M.D.DATE OF BIRTH:  [birth year]   DATE OF CONSULTATION:  03/08/2007  DATE OF DISCHARGE:                                 CONSULTATION   REASON FOR CONSULTATION:  Lower GI bleed.   HISTORY OF PRESENT ILLNESS:  Joanna [name] is a 70 year old African  American female who was admitted on February 23, 2007 for atrial  fibrillation with rapid ventricular response.  She was treated by  cardiology and GI has been following her for some time due to some  abdominal discomfort.  She was anticoagulated.  She developed a lower GI  bleed over the past day or so.  The patient was transferred to the  intensive care unit and intubated due to some respiratory distress.  Hemoglobin was 10 yesterday, has dropped down to 6.6 today and was more  recently was 7.7 after some transfusion.  She is continuing to be  resuscitated.  She underwent EGD and flexible sigmoidoscopy by Dr. Vilinda Boehringer.  The EGD was negative.  Flexible sigmoidoscopy showed old blood  and clots with diverticula.  No active bleeding or fresh blood was seen  per Dr. Randa Evens.  The  patient remains in the intensive care unit  undergoing resuscitation.   PAST MEDICAL HISTORY:  Hypertension.   PAST SURGICAL HISTORY:  Unknown.  None are noted in the chart.   ALLERGIES:  No known drug allergies.   SOCIAL HISTORY:  Does not smoke or drink alcohol.  She is widowed.   REVIEW OF SYSTEMS:  Unable to obtain as she is intubated.   PHYSICAL EXAMINATION:  VITAL SIGNS:  Blood pressure 81/48, heart rate  125, respirations 39 on the vent.  Saturation 93%.  GENERAL:  She is sedated on ventilator.  NECK:  Supple.  LUNGS:  Clear to auscultation.  Respiratory rate in the 30s.  HEART:  Tachycardiac.  Impulse palpable in the left chest.  ABDOMEN:   Distended but soft.  There is no appreciable tenderness  present.  Bowel sounds are hypoactive.  RECTAL:  Deferred as the patient just completed a flexible sigmoidoscopy  examination.  EXTREMITIES:  No significant edema.   LABORATORY DATA:  Hemoglobin was found as above with the addition of INR  1.7.   IMPRESSION AND PLAN:  Lower gastrointestinal bleed.  I agree with Dr.  Randa Evens that this is likely a diverticular source, but he does describe  the blood as all old without any fresh bright red blood on his scope.  I  agree with  continued aggressive resuscitation per CCM service including  blood  products, packed red blood cells and fresh frozen plasma.  If she  continues to bleed, I agree with Dr. Randa Evens and our discussion that I  would proceed with angio and possible embolization first.  We will  follow closely for the need for colectomy if this is unsuccessful.      Gabrielle Dare Janee Morn, M.D.  Electronically Signed     BET/MEDQ  D:  03/08/2007  T:  03/09/2007  Job:  [id number]   cc:   Joanna [name]., M.D.  Cassell Clement, M.D.  James L. Malon Kindle., M.D.

## 2011-04-01 NOTE — Op Note (Signed)
[name], [name] NO.:  [id number]   MEDICAL RECORD NO.:  [id number]          PATIENT TYPE:  INP   LOCATION:  2108                         FACILITY:  [facility]   PHYSICIAN:  Fayrene Fearing L. Malon Kindle., M.D.DATE OF BIRTH:  January 29, 1941   DATE OF PROCEDURE:  03/08/2007  DATE OF DISCHARGE:                               OPERATIVE REPORT   PROCEDURE:  Esophagogastroduodenoscopy.   INDICATION:  Please see previously dictated sigmoidoscopy note.   DESCRIPTION OF PROCEDURE:  Following the sigmoidoscopy, the patient was  turned on her left lateral decubitus position.  She does have an  endotracheal tube in place, NG, and oral airway. These were removed and  the cuff was let down on the endotracheal tube.  The Pentax scope was  inserted and advanced into the stomach.  The esophagus was easily  entered, the scope was reinserted.  There was no blood whatsoever in the  esophagus.  We passed down in the stomach and down in the distal  duodenum.  No blood, coffee ground material, any signs whatsoever of any  active GI bleeding.  The second duodenum, bulb, antrum, body of the  stomach were completely normal as was the cardia and fundus of the  stomach.  There was a hiatal hernia.  The distal and proximal esophagus  were endoscopically normal.  The scope was withdrawn.  The patient  tolerated the procedure well.   ASSESSMENT:  Acute GI bleed with no signs of upper GI bleeding.  This  was probably a diverticular bleed.   PLAN:  Will continue support, questionable angiogram, and will obtain a  surgical consultation.           ______________________________  Llana Aliment Malon Kindle., M.D.     Waldron Session  D:  03/08/2007  T:  03/08/2007  Job:  [id number]   cc:   Elmore Guise., M.D.  Coralyn Helling, MD

## 2011-04-01 NOTE — Op Note (Signed)
Joanna [name], JUNEAU NO.:  [id number]   MEDICAL RECORD NO.:  [id number]          PATIENT TYPE:  INP   LOCATION:  2108                         FACILITY:  [facility]   PHYSICIAN:  Fayrene Fearing L. Malon Kindle., M.D.DATE OF BIRTH:  [birth year]   DATE OF PROCEDURE:  03/08/2007  DATE OF DISCHARGE:                               OPERATIVE REPORT   SURGEON:  Fayrene Fearing L. Randa Evens, M.D.   PROCEDURE:  Flexible sigmoidoscopy.   MEDICATIONS:  The patient is on a ventilator and on a fentanyl 25 mcg  per hour and Versed 2 mg per hour drip, and is already quite sedated.   INDICATIONS:  The patient has been on Coumadin, has had a destabilizing  GI bleed.  We plan to do an upper endo and a sigmoidoscopy.  She has  received tap-water enemas.   DESCRIPTION OF PROCEDURE:  The procedure had been explained to the  patient and son and consent obtained.  In the right lateral decubitus  position, I went ahead and did the sigmoid first, since that was the  position she was in and that is the way it was hooked up.  I inserted  the scope.  A large amount of coffee grounds material, but no active  bright red blood in the rectum.  I cleaned out and went up to  approximately the proximal descending colon.  Multiple diverticula with  old coffee grounds material and clots.  No bright red blood was seen  whatsoever.  There was no clear bleeding site seen to that point and I  was unable to advance beyond that due to the amount of stool and clots.  The scope was withdrawn.  The patient tolerated the procedure well.   PLAN:  Will go ahead with endoscopy now as planned.           ______________________________  Llana Aliment. Malon Kindle., M.D.     Waldron Session  D:  03/08/2007  T:  03/08/2007  Job:  [id number]   cc:   Elmore Guise., M.D.  Dr Alphonzo Severance

## 2011-04-01 NOTE — Consult Note (Signed)
NAMEJHANE, LORIO NO.:  [id number]   MEDICAL RECORD NO.:  [id number]          PATIENT TYPE:  INP   LOCATION:  3703                         FACILITY:  [facility]   PHYSICIAN:  Bernette Redbird, M.D.   DATE OF BIRTH:  December 14, 1940   DATE OF CONSULTATION:  02/24/2007  DATE OF DISCHARGE:                                 CONSULTATION   REFERRING PHYSICIAN:  Dr. Reyes Ivan   HISTORY:  Dr. Reyes Ivan asked [place] to see this 70 year old female because of  abdominal pain.   Ms. Dede was admitted to the hospital yesterday because of atrial  fibrillation with rapid ventricular response which was noted  incidentally when she presented with abdominal pain and constipation.   The patient indicates that about a month ago, or at least several weeks  ago, she had a flu-like illness with diarrhea.  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  She  thereafter noted diminished stool output with really no formed stool, so  she was concerned she was constipated and presented her primary  physician who tried her on Amitiza, which led to watery stools.  She was  thus going along her way when several days ago she developed significant  abdominal pain which is described as an aching pain, 10/10 in terms of  severity, rather diffuse in the abdomen, which persists to the present  time.  She has not eaten anything for the past 2 days.  I do not believe  there have been any fevers with this.  There has not been any frank  vomiting, although I think she has felt a little bit nauseated.   The patient had a colonoscopy 2 years ago by Dr. Evette Cristal, apparently  negative.   There is no family history of biliary tract disease or colon cancer.   PAST MEDICAL HISTORY:  No known allergies.   OUTPATIENT MEDICATIONS:  Lasix and blood pressure medicine.   OPERATIONS:  None.   CHRONIC MEDICAL ILLNESSES:  Hypertension.  No known cardiopulmonary  disease (apart from the  AFib noted above which was just recognized on  the current admission).  No diabetes.  No ulcers, tuberculosis or  asthma.   HABITS:  Nonsmoker, nondrinker.   FAMILY HISTORY:  Negative for GI illnesses such as biliary tract disease  or colon cancer.   SOCIAL HISTORY:  The patient lives alone and works as a Games developer at  the [employer].   REVIEW OF SYSTEMS:  Negative for prodromal upper tract symptoms such as  reflux or dysphagia, negative for lower tract symptoms such as  constipation or rectal bleeding.  She might become occasionally  constipated and use some prune juice.   PHYSICAL EXAMINATION:  VITAL SIGNS:  Heart rate is currently around 100-  110 (she is on Cardizem drip and amiodarone), blood pressure 123/80,  afebrile.  GENERAL:  She is anicteric without pallor.  EXTREMITIES:  Have 2+ pitting edema in the lower extremities.  CHEST:  Clear, no wheezes or rales appreciated.  HEART:  Irregular rhythm, fairly rapid.  ABDOMEN:  Active bowel sounds.  No bruits.  Somewhat obese, not overtly  distended but it is hard to tell, diffusely tender but without  peritoneal findings.  The patient appears somewhat restless and  uncomfortable but not in by any means extreme distress.  She has some  guarding although the abdomen is fairly soft.  Overall, it is a benign,  nonsurgical abdomen.  RECTAL:  Shows an empty rectal ampulla.  No masses, no anal stenosis.  No fecal impaction. Mucoid stool, Hemoccult-negative when sent to the  lab.   LABORATORY DATA:  Admission white count 11,000.  Current white count at  time of dictation has risen to 15,400.  Hemoglobin has fallen from 13.6  to 11 with hydration.  MCV is low normal at 81 and RDW is elevated at  16.9.  Platelet count normal at 343,000.  INR normal at 1.3.  BUN was 20  on admission and is now 26.  Creatinine was 0.9 on admission and is now  1.8.  Total bilirubin was 2.8 on admission and has risen to 4.3; this is   roughly 50% direct and indirect.  Alk phos elevated at 212, stable.  AST  185, ALT 89, slightly elevated compared to earlier today.  Amylase and  lipase normal.  Troponin-I elevated at 1.05.  TSH normal at 1.44.  BNP  elevated at 884.   ADDENDUM:  We ordered an abdominopelvic CT scan which shows a fresh  splenic infarct, some ascites and passive congestion of the liver and a  contracted gallbladder.   The patient was subsequently treated with some Percocet and at this time  is resting comfortably, and the abdominal exam is much more benign,  essentially nontender.   IMPRESSION:  1. Subacute abdominal pain probably due to splenic infarct, most      likely secondary to an embolism related to recent-onset atrial      fibrillation or intermittent atrial fibrillation.  2. Elevated liver chemistries consistent with passive congestion of      the liver as seen on the CT scan.  3. Ascites and peripheral edema, most likely related to some element      of heart failure and possibly suggesting a degree of chronicity.  4. Rising renal function parameters suggesting the possibility of      acute renal failure.   RECOMMENDATIONS:  From the GI tract standpoint, the patient has a benign  prognosis and conservative analgesic therapy should do the trick.  I am  going to use Percocet to see if this can control the pain, without  putting the patient at risk for venodilatation, drop in blood pressure,  or myocardial suppression as might occur with parenteral narcotic doses.  Will follow the liver chemistries over time, but as long as they return  to normal with restoration of cardiac normal rhythm, I do not think any  further evaluation is needed.   The patient has some borderline microcytic anemia, but with heme-  negative stool and a negative colonoscopy (by her report) in the past 2 years, I do not think further followup of this is needed from the GI  perspective since it is not likely be due to GI  tract blood loss.   I appreciate the opportunity to have seen this patient in consultation  with you.           ______________________________  Bernette Redbird, M.D.     RB/MEDQ  D:  02/24/2007  T:  02/24/2007  Job:  [id number]  cc:   Al Decant. Janey Greaser, MD  Elmore Guise., M.D.

## 2011-04-27 ENCOUNTER — Ambulatory Visit (INDEPENDENT_AMBULATORY_CARE_PROVIDER_SITE_OTHER): Payer: Medicare Other | Admitting: *Deleted

## 2011-04-27 ENCOUNTER — Other Ambulatory Visit: Payer: Self-pay | Admitting: *Deleted

## 2011-04-27 DIAGNOSIS — I4891 Unspecified atrial fibrillation: Secondary | ICD-10-CM

## 2011-04-27 LAB — POCT INR: INR: 2.9

## 2011-04-27 MED ORDER — WARFARIN SODIUM 5 MG PO TABS: 5.0000 mg | ORAL_TABLET | Freq: Every day | ORAL | Status: DC

## 2011-04-27 NOTE — Telephone Encounter (Signed)
Coumadin refilled. 

## 2011-05-25 ENCOUNTER — Encounter: Payer: Medicare Other | Admitting: *Deleted

## 2011-05-25 ENCOUNTER — Ambulatory Visit (INDEPENDENT_AMBULATORY_CARE_PROVIDER_SITE_OTHER): Payer: Medicare Other | Admitting: *Deleted

## 2011-05-25 DIAGNOSIS — I4891 Unspecified atrial fibrillation: Secondary | ICD-10-CM

## 2011-06-08 ENCOUNTER — Ambulatory Visit (INDEPENDENT_AMBULATORY_CARE_PROVIDER_SITE_OTHER): Payer: Medicare Other | Admitting: *Deleted

## 2011-06-08 DIAGNOSIS — I4891 Unspecified atrial fibrillation: Secondary | ICD-10-CM

## 2011-06-09 ENCOUNTER — Telehealth: Payer: Self-pay | Admitting: *Deleted

## 2011-06-09 NOTE — Telephone Encounter (Signed)
Called to give her a follow up appointment.

## 2011-07-06 ENCOUNTER — Ambulatory Visit (INDEPENDENT_AMBULATORY_CARE_PROVIDER_SITE_OTHER): Payer: Medicare Other | Admitting: *Deleted

## 2011-07-06 DIAGNOSIS — I4891 Unspecified atrial fibrillation: Secondary | ICD-10-CM

## 2011-07-06 LAB — POCT INR: INR: 3

## 2011-07-25 ENCOUNTER — Encounter: Payer: Self-pay | Admitting: Cardiology

## 2011-07-29 ENCOUNTER — Ambulatory Visit (INDEPENDENT_AMBULATORY_CARE_PROVIDER_SITE_OTHER): Payer: Medicare Other | Admitting: Cardiology

## 2011-07-29 ENCOUNTER — Encounter: Payer: Self-pay | Admitting: Cardiology

## 2011-07-29 ENCOUNTER — Ambulatory Visit (INDEPENDENT_AMBULATORY_CARE_PROVIDER_SITE_OTHER): Payer: Medicare Other | Admitting: *Deleted

## 2011-07-29 VITALS — BP 122/86 | HR 80 | Ht 63.5 in | Wt 214.0 lb

## 2011-07-29 DIAGNOSIS — I639 Cerebral infarction, unspecified: Secondary | ICD-10-CM

## 2011-07-29 DIAGNOSIS — I10 Essential (primary) hypertension: Secondary | ICD-10-CM | POA: Insufficient documentation

## 2011-07-29 DIAGNOSIS — I482 Chronic atrial fibrillation, unspecified: Secondary | ICD-10-CM

## 2011-07-29 DIAGNOSIS — I4891 Unspecified atrial fibrillation: Secondary | ICD-10-CM

## 2011-07-29 DIAGNOSIS — I6350 Cerebral infarction due to unspecified occlusion or stenosis of unspecified cerebral artery: Secondary | ICD-10-CM

## 2011-07-29 DIAGNOSIS — I519 Heart disease, unspecified: Secondary | ICD-10-CM

## 2011-07-29 DIAGNOSIS — E669 Obesity, unspecified: Secondary | ICD-10-CM

## 2011-07-29 NOTE — Assessment & Plan Note (Signed)
Her rate is well controlled and she is on chronic anticoagulation with therapeutic INR levels. We'll recheck an INR in 4 weeks. She needs to remain on Coumadin lifelong.

## 2011-07-29 NOTE — Progress Notes (Signed)
Joanna [name] Date of Birth: 1941-08-23   History of Present Illness: Joanna [name] is seen today for followup. She has a history of chronic atrial flutter ablation. She has had a previous left MCA CVA and is now on chronic Coumadin therapy. She has a prior history of left ventricular dysfunction but her most recent echocardiogram this year showed an ejection fraction of 50-55%. She reports that she is doing well. She denies any recurrent TIA or CVA symptoms. She denies any palpitations. She has had no significant dyspnea or chest pain. She knows she needs to lose weight. She has been inactive.  Current Outpatient Prescriptions on File Prior to Visit  Medication Sig Dispense Refill  . aspirin 325 MG tablet Take 325 mg by mouth daily.        Marland Kitchen diltiazem (CARDIZEM) 90 MG tablet Take 90 mg by mouth 2 (two) times daily.        . furosemide (LASIX) 80 MG tablet Take 40 mg by mouth every other day.       . metoprolol (TOPROL-XL) 100 MG 24 hr tablet Take 100 mg by mouth 2 (two) times daily.        . Multiple Vitamin (MULTI-VITAMIN PO) Take by mouth daily.        Marland Kitchen warfarin (COUMADIN) 5 MG tablet Take 1 tablet (5 mg total) by mouth daily.  90 tablet  11    No Known Allergies  Past Medical History  Diagnosis Date  . Chronic atrial fibrillation   . Ischemic bowel disease     With GI bleeding  . Splenic infarct   . Left ventricular dysfunction     EF is now 50-55% as of 2012  . Stroke February 2012    Now back on coumadin  . Diverticulosis   . Hypertension   . Obesity   . Aphasia 2012  . Dysnomia 12/2010  . Chronic kidney disease   . Acute renal failure 02/23/2007-03/22/2007    Now with normalization back to baseline    Past Surgical History  Procedure Date  . Ileostomy   . Colectomy 12/2010    partial  . Omentectomy     partial with ileostomy    History  Smoking status  . Never Smoker   Smokeless tobacco  . Not on file    History  Alcohol Use No    Family History    Problem Relation Age of Onset  . Heart disease Mother   . Hypertension Sister     Review of Systems: As noted in history of present illness..  All other systems were reviewed and are negative.  Physical Exam: BP 122/86  Pulse 80  Ht 5' 3.5" (1.613 m)  Wt 214 lb (97.07 kg)  BMI 37.31 kg/m2 She is a pleasant, obese black female in no acute distress.The patient is alert and oriented x 3.  The mood and affect are normal.  The skin is warm and dry.  Color is normal.  The HEENT exam reveals that the sclera are nonicteric.  The mucous membranes are moist.  The carotids are 2+ without bruits.  There is no thyromegaly.  There is no JVD.  The lungs are clear.  The chest wall is non tender.  The heart exam reveals a regular rate with a normal S1 and S2.  There is a soft apical systolic murmur.  The PMI is not displaced.   Abdominal exam reveals good bowel sounds.  There is no guarding or rebound.  There  is no hepatosplenomegaly or tenderness.  There are no masses.  Exam of the legs reveal no clubbing, cyanosis, or edema.  The legs are without rashes.  The distal pulses are intact.  Cranial nerves II - XII are intact.  Motor and sensory functions are intact.  The gait is normal.  LABORATORY DATA:   Assessment / Plan:

## 2011-07-29 NOTE — Assessment & Plan Note (Signed)
Blood pressure is under excellent control on current medications. I stressed the importance of regular aerobic exercise and weight loss and her long-term management.

## 2011-07-29 NOTE — Patient Instructions (Signed)
You need to lose weight. Avoid sweets and carbohydrates. Eat more fresh fruits and vegetables.  Increase your aerobic activity.  Stop ASA   I will see you again in 6 months.

## 2011-08-25 LAB — BASIC METABOLIC PANEL
BUN: 10
BUN: 17
CO2: 25
CO2: 28
Chloride: 107
Chloride: 107
Creatinine, Ser: 1.19
Potassium: 4.2

## 2011-08-25 LAB — CBC
HCT: 38.5
Hemoglobin: 11.4 — ABNORMAL LOW
MCHC: 33.6
MCHC: 34
MCV: 94.6
MCV: 94.9
Platelets: 258
RBC: 3.53 — ABNORMAL LOW
WBC: 7.1

## 2011-08-26 ENCOUNTER — Ambulatory Visit (INDEPENDENT_AMBULATORY_CARE_PROVIDER_SITE_OTHER): Payer: Medicare Other | Admitting: *Deleted

## 2011-08-26 DIAGNOSIS — I4891 Unspecified atrial fibrillation: Secondary | ICD-10-CM

## 2011-09-23 ENCOUNTER — Ambulatory Visit (INDEPENDENT_AMBULATORY_CARE_PROVIDER_SITE_OTHER): Payer: Medicare Other | Admitting: *Deleted

## 2011-09-23 DIAGNOSIS — I4891 Unspecified atrial fibrillation: Secondary | ICD-10-CM

## 2011-09-23 LAB — POCT INR: INR: 1.8

## 2011-10-14 ENCOUNTER — Ambulatory Visit (INDEPENDENT_AMBULATORY_CARE_PROVIDER_SITE_OTHER): Payer: Medicare Other | Admitting: *Deleted

## 2011-10-14 DIAGNOSIS — I4891 Unspecified atrial fibrillation: Secondary | ICD-10-CM

## 2011-11-11 ENCOUNTER — Ambulatory Visit (INDEPENDENT_AMBULATORY_CARE_PROVIDER_SITE_OTHER): Payer: Medicare Other | Admitting: *Deleted

## 2011-11-11 DIAGNOSIS — I4891 Unspecified atrial fibrillation: Secondary | ICD-10-CM

## 2011-12-06 ENCOUNTER — Other Ambulatory Visit (HOSPITAL_COMMUNITY): Payer: Self-pay | Admitting: Family Medicine

## 2011-12-06 DIAGNOSIS — Z1231 Encounter for screening mammogram for malignant neoplasm of breast: Secondary | ICD-10-CM

## 2011-12-09 ENCOUNTER — Encounter: Payer: Medicare Other | Admitting: *Deleted

## 2011-12-16 ENCOUNTER — Ambulatory Visit (INDEPENDENT_AMBULATORY_CARE_PROVIDER_SITE_OTHER): Payer: Medicare Other | Admitting: *Deleted

## 2011-12-16 DIAGNOSIS — I4891 Unspecified atrial fibrillation: Secondary | ICD-10-CM

## 2011-12-16 LAB — POCT INR: INR: 3.6

## 2011-12-30 ENCOUNTER — Encounter: Payer: Medicare Other | Admitting: *Deleted

## 2012-01-06 ENCOUNTER — Ambulatory Visit (INDEPENDENT_AMBULATORY_CARE_PROVIDER_SITE_OTHER): Payer: Medicare Other | Admitting: Pharmacist

## 2012-01-06 ENCOUNTER — Ambulatory Visit (HOSPITAL_COMMUNITY): Payer: Medicare Other

## 2012-01-06 DIAGNOSIS — I4891 Unspecified atrial fibrillation: Secondary | ICD-10-CM

## 2012-01-06 LAB — POCT INR
INR: 2.9
INR: 2.9

## 2012-01-13 ENCOUNTER — Ambulatory Visit: Payer: Medicare Other | Admitting: Cardiology

## 2012-01-27 ENCOUNTER — Ambulatory Visit (INDEPENDENT_AMBULATORY_CARE_PROVIDER_SITE_OTHER): Payer: Medicare Other | Admitting: *Deleted

## 2012-01-27 DIAGNOSIS — I4891 Unspecified atrial fibrillation: Secondary | ICD-10-CM

## 2012-01-30 ENCOUNTER — Ambulatory Visit (HOSPITAL_COMMUNITY)
Admission: RE | Admit: 2012-01-30 | Discharge: 2012-01-30 | Disposition: A | Discharge status: Home / Self Care | Payer: Medicare Other | Source: Ambulatory Visit | Attending: Family Medicine | Admitting: Family Medicine

## 2012-01-30 DIAGNOSIS — Z1231 Encounter for screening mammogram for malignant neoplasm of breast: Secondary | ICD-10-CM | POA: Insufficient documentation

## 2012-02-17 ENCOUNTER — Encounter: Payer: Self-pay | Admitting: Cardiology

## 2012-02-17 ENCOUNTER — Ambulatory Visit (INDEPENDENT_AMBULATORY_CARE_PROVIDER_SITE_OTHER): Payer: Medicare Other | Admitting: Cardiology

## 2012-02-17 ENCOUNTER — Ambulatory Visit (INDEPENDENT_AMBULATORY_CARE_PROVIDER_SITE_OTHER): Payer: Medicare Other | Admitting: Pharmacist

## 2012-02-17 VITALS — BP 138/92 | HR 113 | Wt 213.0 lb

## 2012-02-17 DIAGNOSIS — I4891 Unspecified atrial fibrillation: Secondary | ICD-10-CM

## 2012-02-17 DIAGNOSIS — Z7901 Long term (current) use of anticoagulants: Secondary | ICD-10-CM

## 2012-02-17 DIAGNOSIS — I6350 Cerebral infarction due to unspecified occlusion or stenosis of unspecified cerebral artery: Secondary | ICD-10-CM

## 2012-02-17 DIAGNOSIS — I639 Cerebral infarction, unspecified: Secondary | ICD-10-CM

## 2012-02-17 DIAGNOSIS — I10 Essential (primary) hypertension: Secondary | ICD-10-CM

## 2012-02-17 LAB — POCT INR: INR: 2.4

## 2012-02-17 MED ORDER — DILTIAZEM HCL ER COATED BEADS 240 MG PO CP24: 240.0000 mg | ORAL_CAPSULE | Freq: Every day | ORAL | Status: DC

## 2012-02-17 NOTE — Assessment & Plan Note (Signed)
Heart rate control is not optimal. We will continue metoprolol 100 mg twice a day. We will switch her short acting diltiazem 2 Cardizem CD 240 mg daily. She will remain on chronic anticoagulation. I'll followup again in 6 months.

## 2012-02-17 NOTE — Patient Instructions (Signed)
We will switch the short acting diltiazem to the long acting Cartia XT 240 mg once a day.  Continue your other medication.  I will see you again in 6 months.

## 2012-02-17 NOTE — Progress Notes (Signed)
Joanna [name] Date of Birth: [birth year]   History of Present Illness: Mrs. Joanna [name] is seen today for followup of atrial fibrillation.. She has a history of chronic atrial fibrillation. She has had a previous left MCA CVA and is now on chronic Coumadin therapy. She has a prior history of left ventricular dysfunction but her most recent echocardiogram  showed an ejection fraction of 50-55%. She reports that she is doing well. She denies any recurrent TIA or CVA symptoms. She denies any palpitations. She has had no significant dyspnea or chest pain. She has had no bleeding on Coumadin.  Current Outpatient Prescriptions on File Prior to Visit  Medication Sig Dispense Refill  . furosemide (LASIX) 80 MG tablet Take 40 mg by mouth every other day.       . metoprolol (TOPROL-XL) 100 MG 24 hr tablet Take 100 mg by mouth 2 (two) times daily.        . Multiple Vitamin (MULTI-VITAMIN PO) Take by mouth daily.        Marland Kitchen DISCONTD: warfarin (COUMADIN) 5 MG tablet Take 1 tablet (5 mg total) by mouth daily.  90 tablet  11  . diltiazem (CARTIA XT) 240 MG 24 hr capsule Take 1 capsule (240 mg total) by mouth daily.  90 capsule  3    No Known Allergies  Past Medical History  Diagnosis Date  . Chronic atrial fibrillation   . Ischemic bowel disease     With GI bleeding  . Splenic infarct   . Left ventricular dysfunction     EF is now 50-55% as of 2012  . Stroke February 2012    Now back on coumadin  . Diverticulosis   . Hypertension   . Obesity   . Aphasia 2012  . Dysnomia 12/2010  . Chronic kidney disease   . Acute renal failure 02/23/2007-03/22/2007    Now with normalization back to baseline    Past Surgical History  Procedure Date  . Ileostomy   . Colectomy 12/2010    partial  . Omentectomy     partial with ileostomy    History  Smoking status  . Never Smoker   Smokeless tobacco  . Not on file    History  Alcohol Use No    Family History  Problem Relation Age of Onset  . Heart  disease Mother   . Hypertension Sister     Review of Systems: As noted in history of present illness..  All other systems were reviewed and are negative.  Physical Exam: BP 138/92  Pulse 113  Wt 213 lb (96.616 kg) She is a pleasant, obese black female in no acute distress.The patient is alert and oriented x 3.  The mood and affect are normal.  The skin is warm and dry.  Color is normal.  The HEENT exam reveals that the sclera are nonicteric.  The mucous membranes are moist.  The carotids are 2+ without bruits.  There is no thyromegaly.  There is no JVD.  The lungs are clear.  The chest wall is non tender.  The heart exam reveals a regular rate with a normal S1 and S2.  There is a soft apical systolic murmur.  The PMI is not displaced.   Abdominal exam reveals good bowel sounds.  There is no guarding or rebound.  There is no hepatosplenomegaly or tenderness.  There are no masses.  Exam of the legs reveal no clubbing, cyanosis, or edema.  The legs are without rashes.  The  distal pulses are intact.  Cranial nerves II - XII are intact.  Motor and sensory functions are intact.  The gait is normal.  LABORATORY DATA: ECG today demonstrates atrial fibrillation with an increased ventricular response in the 113 beats per minute. She has nonspecific ST-T wave changes. INR today is 2.4. Assessment / Plan:

## 2012-02-17 NOTE — Assessment & Plan Note (Signed)
Blood pressure is under adequate control

## 2012-02-17 NOTE — Assessment & Plan Note (Signed)
She is on chronic anticoagulation with Coumadin with a therapeutic INR.

## 2012-02-18 ENCOUNTER — Other Ambulatory Visit: Payer: Self-pay | Admitting: Cardiology

## 2012-02-21 ENCOUNTER — Telehealth: Payer: Self-pay | Admitting: Cardiology

## 2012-02-21 NOTE — Telephone Encounter (Signed)
Patient called, stated long acting cartia cost too much.[facility] was called and she did receive generic long acting cartia.Patient was told will speak to Dr.Jordan tomorrow 02/22/12 to let him know.

## 2012-02-21 NOTE — Telephone Encounter (Signed)
New msg Pt called and had some questions about her meds.

## 2012-02-22 NOTE — Telephone Encounter (Signed)
Patient called was told Dr.Jordan advised when finished with long acting cartia she may stop and go back to short acting 90 mg three times a day.Patient stated she was going to just stay on the long acting more convenient.

## 2012-03-23 ENCOUNTER — Encounter: Payer: Self-pay | Admitting: Cardiology

## 2012-03-30 ENCOUNTER — Ambulatory Visit (INDEPENDENT_AMBULATORY_CARE_PROVIDER_SITE_OTHER): Payer: Medicare Other | Admitting: Pharmacist

## 2012-03-30 DIAGNOSIS — I4891 Unspecified atrial fibrillation: Secondary | ICD-10-CM

## 2012-05-11 ENCOUNTER — Ambulatory Visit (INDEPENDENT_AMBULATORY_CARE_PROVIDER_SITE_OTHER): Payer: Medicare Other | Admitting: *Deleted

## 2012-05-11 DIAGNOSIS — I4891 Unspecified atrial fibrillation: Secondary | ICD-10-CM

## 2012-05-14 ENCOUNTER — Telehealth: Payer: Self-pay | Admitting: Cardiology

## 2012-05-14 NOTE — Telephone Encounter (Signed)
Patient called stated cardizem cd 240 mg cost too much.Cardizem short acting is less expensive.Message fowarded to Dr.Jordan for a order for cardizem 120 mg twice a day.

## 2012-05-14 NOTE — Telephone Encounter (Signed)
OK to take generic diltiazem 120 mg bid. Laurissa Cowper [place] MD, [facility]

## 2012-05-14 NOTE — Telephone Encounter (Signed)
New msg °Pt wants to talk to you about her meds. Please call °

## 2012-05-15 MED ORDER — DILTIAZEM HCL 120 MG PO TABS: ORAL_TABLET | ORAL | Status: DC

## 2012-05-15 NOTE — Telephone Encounter (Signed)
Patient called was told okay with Dr.Jordan to take generic diltiazem 120 mg twice a day.Prescription sent to [facility] on [address].

## 2012-05-15 NOTE — Addendum Note (Signed)
Addended by: Meda Klinefelter D on: 05/15/2012 08:57 AM   Modules accepted: Orders

## 2012-06-01 ENCOUNTER — Ambulatory Visit (INDEPENDENT_AMBULATORY_CARE_PROVIDER_SITE_OTHER): Payer: Medicare Other

## 2012-06-01 DIAGNOSIS — I4891 Unspecified atrial fibrillation: Secondary | ICD-10-CM

## 2012-06-05 ENCOUNTER — Other Ambulatory Visit: Payer: Self-pay | Admitting: Cardiology

## 2012-06-29 ENCOUNTER — Ambulatory Visit (INDEPENDENT_AMBULATORY_CARE_PROVIDER_SITE_OTHER): Payer: Medicare Other | Admitting: Pharmacist

## 2012-06-29 DIAGNOSIS — I4891 Unspecified atrial fibrillation: Secondary | ICD-10-CM

## 2012-07-27 ENCOUNTER — Ambulatory Visit (INDEPENDENT_AMBULATORY_CARE_PROVIDER_SITE_OTHER): Payer: Medicare Other | Admitting: *Deleted

## 2012-07-27 DIAGNOSIS — I4891 Unspecified atrial fibrillation: Secondary | ICD-10-CM

## 2012-08-10 ENCOUNTER — Ambulatory Visit (INDEPENDENT_AMBULATORY_CARE_PROVIDER_SITE_OTHER): Payer: Medicare Other | Admitting: *Deleted

## 2012-08-10 DIAGNOSIS — I4891 Unspecified atrial fibrillation: Secondary | ICD-10-CM

## 2012-08-15 ENCOUNTER — Ambulatory Visit: Payer: Medicare Other | Admitting: Cardiology

## 2012-08-24 ENCOUNTER — Ambulatory Visit (INDEPENDENT_AMBULATORY_CARE_PROVIDER_SITE_OTHER): Payer: Medicare Other

## 2012-08-24 DIAGNOSIS — I4891 Unspecified atrial fibrillation: Secondary | ICD-10-CM

## 2012-08-24 LAB — POCT INR: INR: 3

## 2012-09-14 ENCOUNTER — Ambulatory Visit (INDEPENDENT_AMBULATORY_CARE_PROVIDER_SITE_OTHER): Payer: Medicare Other | Admitting: *Deleted

## 2012-09-14 DIAGNOSIS — I4891 Unspecified atrial fibrillation: Secondary | ICD-10-CM

## 2012-09-14 LAB — POCT INR: INR: 2.5

## 2012-09-28 ENCOUNTER — Ambulatory Visit: Payer: Medicare Other | Admitting: Cardiology

## 2012-10-19 ENCOUNTER — Ambulatory Visit (INDEPENDENT_AMBULATORY_CARE_PROVIDER_SITE_OTHER): Payer: Medicare Other

## 2012-10-19 DIAGNOSIS — I4891 Unspecified atrial fibrillation: Secondary | ICD-10-CM

## 2012-10-24 ENCOUNTER — Ambulatory Visit: Payer: Medicare Other | Admitting: Cardiology

## 2012-11-21 ENCOUNTER — Encounter: Payer: Self-pay | Admitting: Cardiology

## 2012-11-23 ENCOUNTER — Ambulatory Visit (INDEPENDENT_AMBULATORY_CARE_PROVIDER_SITE_OTHER): Payer: Medicare Other | Admitting: *Deleted

## 2012-11-23 DIAGNOSIS — I4891 Unspecified atrial fibrillation: Secondary | ICD-10-CM

## 2012-11-23 LAB — POCT INR: INR: 1.8

## 2012-12-21 ENCOUNTER — Ambulatory Visit (INDEPENDENT_AMBULATORY_CARE_PROVIDER_SITE_OTHER): Payer: Medicare Other | Admitting: *Deleted

## 2012-12-21 DIAGNOSIS — I4891 Unspecified atrial fibrillation: Secondary | ICD-10-CM

## 2012-12-21 LAB — POCT INR: INR: 2.3

## 2013-01-15 ENCOUNTER — Other Ambulatory Visit: Payer: Self-pay | Admitting: Cardiology

## 2013-01-25 ENCOUNTER — Ambulatory Visit (INDEPENDENT_AMBULATORY_CARE_PROVIDER_SITE_OTHER): Payer: Medicare Other | Admitting: *Deleted

## 2013-01-25 DIAGNOSIS — I4891 Unspecified atrial fibrillation: Secondary | ICD-10-CM

## 2013-02-01 ENCOUNTER — Other Ambulatory Visit (HOSPITAL_COMMUNITY): Payer: Self-pay | Admitting: Family Medicine

## 2013-02-01 DIAGNOSIS — Z1231 Encounter for screening mammogram for malignant neoplasm of breast: Secondary | ICD-10-CM

## 2013-02-15 ENCOUNTER — Ambulatory Visit (INDEPENDENT_AMBULATORY_CARE_PROVIDER_SITE_OTHER): Payer: Medicare Other | Admitting: *Deleted

## 2013-02-15 DIAGNOSIS — I4891 Unspecified atrial fibrillation: Secondary | ICD-10-CM

## 2013-02-22 ENCOUNTER — Ambulatory Visit (HOSPITAL_COMMUNITY): Payer: Medicare Other

## 2013-03-08 ENCOUNTER — Ambulatory Visit (INDEPENDENT_AMBULATORY_CARE_PROVIDER_SITE_OTHER): Payer: Medicare Other | Admitting: *Deleted

## 2013-03-08 DIAGNOSIS — I4891 Unspecified atrial fibrillation: Secondary | ICD-10-CM

## 2013-03-19 ENCOUNTER — Ambulatory Visit (HOSPITAL_COMMUNITY): Payer: Medicare Other

## 2013-03-19 ENCOUNTER — Ambulatory Visit (HOSPITAL_COMMUNITY)
Admission: RE | Admit: 2013-03-19 | Discharge: 2013-03-19 | Disposition: A | Discharge status: Home / Self Care | Payer: Medicare Other | Source: Ambulatory Visit | Attending: Family Medicine | Admitting: Family Medicine

## 2013-03-19 DIAGNOSIS — Z1231 Encounter for screening mammogram for malignant neoplasm of breast: Secondary | ICD-10-CM

## 2013-03-22 ENCOUNTER — Ambulatory Visit (INDEPENDENT_AMBULATORY_CARE_PROVIDER_SITE_OTHER): Payer: Medicare Other | Admitting: *Deleted

## 2013-03-22 DIAGNOSIS — I4891 Unspecified atrial fibrillation: Secondary | ICD-10-CM

## 2013-03-22 LAB — POCT INR: INR: 2.3

## 2013-04-09 ENCOUNTER — Ambulatory Visit: Payer: Medicare Other | Admitting: Cardiology

## 2013-04-12 ENCOUNTER — Ambulatory Visit (INDEPENDENT_AMBULATORY_CARE_PROVIDER_SITE_OTHER): Payer: Medicare Other | Admitting: *Deleted

## 2013-04-12 ENCOUNTER — Encounter: Payer: Self-pay | Admitting: Nurse Practitioner

## 2013-04-12 ENCOUNTER — Ambulatory Visit (INDEPENDENT_AMBULATORY_CARE_PROVIDER_SITE_OTHER): Payer: Medicare Other | Admitting: Nurse Practitioner

## 2013-04-12 VITALS — BP 130/68 | HR 64 | Ht 63.0 in | Wt 208.8 lb

## 2013-04-12 DIAGNOSIS — I4891 Unspecified atrial fibrillation: Secondary | ICD-10-CM

## 2013-04-12 NOTE — Progress Notes (Signed)
Joanna [name] Date of Birth: November 10, 1941 Medical Record #[id number]  History of Present Illness: Joanna [name] is seen back today for a one year visit. Seen for Dr. [place]. She is a former patient of Dr. Silva Bandy. She has chronic atrial fib and is on coumadin. Prior left MCA CVA. Has had past LV dysfunction but this improved per last echo - EF 55%. Labs are done by her PCP.  She comes in today. She is here alone. Doing fairly well. No complaint. Not dizzy or lightheaded. No palpitations. No chest pain. Not short of breath. No problems with her coumadin.   Current Outpatient Prescriptions on File Prior to Visit  Medication Sig Dispense Refill  . diltiazem (CARDIZEM) 120 MG tablet Take 120 mg twice daily.  180 tablet  3  . furosemide (LASIX) 80 MG tablet Take 40 mg by mouth every other day.       . metoprolol (TOPROL-XL) 100 MG 24 hr tablet Take 100 mg by mouth 2 (two) times daily.        . Multiple Vitamin (MULTI-VITAMIN PO) Take by mouth daily.        Marland Kitchen warfarin (COUMADIN) 5 MG tablet TAKE ONE TABLET BY MOUTH EVERY DAY  120 tablet  1  . warfarin (COUMADIN) 5 MG tablet Take 5 mg by mouth as directed.       No current facility-administered medications on file prior to visit.    No Known Allergies  Past Medical History  Diagnosis Date  . Chronic atrial fibrillation   . Ischemic bowel disease     With GI bleeding  . Splenic infarct   . Left ventricular dysfunction     EF is now 50-55% as of 2012  . Stroke February 2012    Now back on coumadin  . Diverticulosis   . Hypertension   . Obesity   . Aphasia 2012  . Dysnomia 12/2010  . Chronic kidney disease   . Acute renal failure 02/23/2007-03/22/2007    Now with normalization back to baseline    Past Surgical History  Procedure Laterality Date  . Ileostomy    . Colectomy  12/2010    partial  . Omentectomy      partial with ileostomy    History  Smoking status  . Never Smoker   Smokeless tobacco  . Not on file    History   Alcohol Use No    Family History  Problem Relation Age of Onset  . Heart disease Mother   . Hypertension Sister     Review of Systems: The review of systems is per the HPI.  All other systems were reviewed and are negative.  Physical Exam: BP 130/68  Pulse 64  Ht 5\' 3"  (1.6 m)  Wt 208 lb 12.8 oz (94.711 kg)  BMI 37 kg/m2 Patient is very pleasant and in no acute distress. She has lost 5 pounds but remains obese. Skin is warm and dry. Color is normal.  HEENT is unremarkable. Normocephalic/atraumatic. PERRL. Sclera are nonicteric. Neck is supple. No masses. No JVD. Lungs are clear. Cardiac exam shows an irregular rhythm. Her rate is ok. Abdomen is soft. Extremities are without edema. Gait and ROM are intact. No gross neurologic deficits noted.  LABORATORY DATA: Lab Results  Component Value Date   WBC 7.8 12/26/2010   HGB 14.0 12/26/2010   HCT 42.3 12/26/2010   PLT 187 12/26/2010   GLUCOSE 98 12/26/2010   CHOL  Value: 142  ATP III CLASSIFICATION:  <200     mg/dL   Desirable  657-846  mg/dL   Borderline High  >=962    mg/dL   High        9/52/8413   TRIG 69 12/26/2010   HDL 52 12/26/2010   LDLCALC  Value: 76        Total Cholesterol/HDL:CHD Risk Coronary Heart Disease Risk Table                     Men   Women  1/2 Average Risk   3.4   3.3  Average Risk       5.0   4.4  2 X Average Risk   9.6   7.1  3 X Average Risk  23.4   11.0        Use the calculated Patient Ratio above and the CHD Risk Table to determine the patient's CHD Risk.        ATP III CLASSIFICATION (LDL):  <100     mg/dL   Optimal  244-010  mg/dL   Near or Above                    Optimal  130-159  mg/dL   Borderline  272-536  mg/dL   High  >644     mg/dL   Very High 0/34/7425   ALT 19 12/26/2010   AST 27 12/26/2010   NA 143 12/26/2010   K 3.7 12/26/2010   CL 108 12/26/2010   CREATININE 0.97 12/26/2010   BUN 14 12/26/2010   CO2 25 12/26/2010   TSH 2.781 12/26/2010   INR 2.3 03/22/2013   HGBA1C  Value: 6.0 (NOTE)                                                                        According to the ADA Clinical Practice Recommendations for 2011, when HbA1c is used as a screening test:   >=6.5%   Diagnostic of Diabetes Mellitus           (if abnormal result  is confirmed)  5.7-6.4%   Increased risk of developing Diabetes Mellitus  References:Diagnosis and Classification of Diabetes Mellitus,Diabetes Care,2011,34(Suppl 1):S62-S69 and Standards of Medical Care in         Diabetes - 2011,Diabetes Care,2011,34  (Suppl 1):S11-S61.* 12/26/2010   Lab Results  Component Value Date   INR 2.3 03/22/2013   INR 2.0 03/08/2013   INR 1.7 02/15/2013     Assessment / Plan: 1. Chronic atrial fib - rate is controlled  2. Chronic coumadin - checking INR today.   3. HTN - BP looks ok.   We will see her back in 6 months. Encouraged her to stay active. Check INR today. Labs are checked by her PCP.  Patient is agreeable to this plan and will call if any problems develop in the interim.   Joanna Macadamia, RN, ANP-C [facility] [address] [place], [place]  [address]

## 2013-04-12 NOTE — Patient Instructions (Signed)
I think you are doing well  Stay on your same medicines  See Dr. [place] in 6 months  Call the [facility] office at [phone] if you have any questions, problems or concerns.

## 2013-05-10 ENCOUNTER — Ambulatory Visit (INDEPENDENT_AMBULATORY_CARE_PROVIDER_SITE_OTHER): Payer: Medicare Other | Admitting: *Deleted

## 2013-05-10 DIAGNOSIS — I4891 Unspecified atrial fibrillation: Secondary | ICD-10-CM

## 2013-05-17 ENCOUNTER — Other Ambulatory Visit: Payer: Self-pay | Admitting: Cardiology

## 2013-05-21 NOTE — Telephone Encounter (Signed)
diltiazem (CARDIZEM) 120 MG tablet  Take 120 mg twice daily.   180 tablet   3  Assessment / Plan: 1. Chronic atrial fib - rate is controlled 2. Chronic coumadin - checking INR today.  3. HTN - BP looks ok.  We will see her back in 6 months. Encouraged her to stay active. Check INR today. Labs are checked by her PCP. Patient is agreeable to this plan and will call if any problems develop in the interim.  Rosalio Macadamia, RN, ANP-C [facility] [address] [place], [place]  [address] Rosalio Macadamia, NP at 04/12/2013 3:41 PM

## 2013-06-06 ENCOUNTER — Other Ambulatory Visit: Payer: Self-pay | Admitting: *Deleted

## 2013-06-06 MED ORDER — METOPROLOL SUCCINATE ER 100 MG PO TB24: 100.0000 mg | ORAL_TABLET | Freq: Two times a day (BID) | ORAL | Status: DC

## 2013-06-07 ENCOUNTER — Telehealth: Payer: Self-pay | Admitting: Cardiology

## 2013-06-07 MED ORDER — METOPROLOL TARTRATE 100 MG PO TABS: 100.0000 mg | ORAL_TABLET | Freq: Two times a day (BID) | ORAL | Status: DC

## 2013-06-07 NOTE — Telephone Encounter (Signed)
Returned call to pharmacist Dawn at [facility] pharmacy she stated she received a metoprolol prescription for long acting and patient was taking the short acting.Stated patient wanted to continue with short acting due to price difference.Advised ok to continue Metoprolol tartrate 100 mg twice a day.

## 2013-06-07 NOTE — Telephone Encounter (Signed)
New Prob     Would like to verify change on METOPROLOL. Please call.

## 2013-06-14 ENCOUNTER — Ambulatory Visit (INDEPENDENT_AMBULATORY_CARE_PROVIDER_SITE_OTHER): Payer: Medicare Other | Admitting: Pharmacist

## 2013-06-14 DIAGNOSIS — I4891 Unspecified atrial fibrillation: Secondary | ICD-10-CM

## 2013-06-14 LAB — POCT INR: INR: 2.6

## 2013-07-26 ENCOUNTER — Ambulatory Visit (INDEPENDENT_AMBULATORY_CARE_PROVIDER_SITE_OTHER): Payer: Medicare Other | Admitting: *Deleted

## 2013-07-26 DIAGNOSIS — I4891 Unspecified atrial fibrillation: Secondary | ICD-10-CM

## 2013-07-26 LAB — POCT INR: INR: 3.2

## 2013-08-23 ENCOUNTER — Ambulatory Visit (INDEPENDENT_AMBULATORY_CARE_PROVIDER_SITE_OTHER): Payer: Medicare Other | Admitting: General Practice

## 2013-08-23 DIAGNOSIS — I4891 Unspecified atrial fibrillation: Secondary | ICD-10-CM

## 2013-08-27 ENCOUNTER — Other Ambulatory Visit: Payer: Self-pay | Admitting: *Deleted

## 2013-08-27 MED ORDER — WARFARIN SODIUM 5 MG PO TABS: ORAL_TABLET | ORAL | Status: DC

## 2013-09-20 ENCOUNTER — Ambulatory Visit (INDEPENDENT_AMBULATORY_CARE_PROVIDER_SITE_OTHER): Payer: Medicare Other | Admitting: Pharmacist

## 2013-09-20 DIAGNOSIS — I4891 Unspecified atrial fibrillation: Secondary | ICD-10-CM

## 2013-09-20 LAB — POCT INR: INR: 2.7

## 2013-10-18 ENCOUNTER — Ambulatory Visit (INDEPENDENT_AMBULATORY_CARE_PROVIDER_SITE_OTHER): Payer: Medicare Other | Admitting: *Deleted

## 2013-10-18 DIAGNOSIS — I4891 Unspecified atrial fibrillation: Secondary | ICD-10-CM

## 2013-10-18 LAB — POCT INR: INR: 3

## 2013-11-20 ENCOUNTER — Ambulatory Visit (INDEPENDENT_AMBULATORY_CARE_PROVIDER_SITE_OTHER): Payer: Medicare Other | Admitting: *Deleted

## 2013-11-20 DIAGNOSIS — I4891 Unspecified atrial fibrillation: Secondary | ICD-10-CM

## 2013-11-20 LAB — POCT INR: INR: 2.7

## 2013-12-16 ENCOUNTER — Encounter: Payer: Self-pay | Admitting: Cardiology

## 2013-12-16 ENCOUNTER — Ambulatory Visit (INDEPENDENT_AMBULATORY_CARE_PROVIDER_SITE_OTHER): Payer: Medicare Other | Admitting: Cardiology

## 2013-12-16 VITALS — BP 122/80 | HR 63 | Ht 63.0 in | Wt 203.0 lb

## 2013-12-16 DIAGNOSIS — I4891 Unspecified atrial fibrillation: Secondary | ICD-10-CM

## 2013-12-16 DIAGNOSIS — I10 Essential (primary) hypertension: Secondary | ICD-10-CM

## 2013-12-16 DIAGNOSIS — I519 Heart disease, unspecified: Secondary | ICD-10-CM

## 2013-12-16 NOTE — Patient Instructions (Signed)
Reduce diltiazem to 120 mg once a day.  Reduce lasix to half of your current dose.   Continue your other therapy  I will see you in 6 months.

## 2013-12-16 NOTE — Progress Notes (Signed)
Joanna RifflePatricia A [name] Date of Birth: 12/07/1940 Medical Record #[id number]  History of Present Illness: Joanna [name] is seen back today for a one year visit. She has chronic atrial fib and is on coumadin. Managed with rate control. Prior left MCA CVA. Has had past LV dysfunction but this improved per last echo - EF 55%. Labs are followed by her PCP. She reports she is concerned about how much medication she is taking. She feels "wobbly" and doesn't feel right in her body. Some lightheadedness. Slight SOB when she goes to her mailbox. No edema. Unclear her dose of lasix but thinks she is on 80 mg.  Current Outpatient Prescriptions on File Prior to Visit  Medication Sig Dispense Refill  . diltiazem (CARDIZEM) 120 MG tablet TAKE ONE TABLET BY MOUTH TWICE DAILY  180 tablet  3  . furosemide (LASIX) 80 MG tablet Take 40 mg by mouth every other day.       . metoprolol (LOPRESSOR) 100 MG tablet Take 1 tablet (100 mg total) by mouth 2 (two) times daily.  60 tablet  6  . Multiple Vitamin (MULTI-VITAMIN PO) Take by mouth daily.        Marland Kitchen. warfarin (COUMADIN) 5 MG tablet Take as directed by coumadin clinic  120 tablet  1   No current facility-administered medications on file prior to visit.    No Known Allergies  Past Medical History  Diagnosis Date  . Chronic atrial fibrillation   . Ischemic bowel disease     With GI bleeding  . Splenic infarct   . Left ventricular dysfunction     EF is now 50-55% as of 2012  . Stroke February 2012    Now back on coumadin  . Diverticulosis   . Hypertension   . Obesity   . Aphasia 2012  . Dysnomia 12/2010  . Chronic kidney disease   . Acute renal failure 02/23/2007-03/22/2007    Now with normalization back to baseline    Past Surgical History  Procedure Laterality Date  . Ileostomy    . Colectomy  12/2010    partial  . Omentectomy      partial with ileostomy    History  Smoking status  . Never Smoker   Smokeless tobacco  . Not on file    History   Alcohol Use No    Family History  Problem Relation Age of Onset  . Heart disease Mother   . Hypertension Sister     Review of Systems: The review of systems is per the HPI.  All other systems were reviewed and are negative.  Physical Exam: BP 122/80  Pulse 63  Ht 5\' 3"  (1.6 m)  Wt 203 lb (92.08 kg)  BMI 35.97 kg/m2 Patient is very pleasant and in no acute distress. She has lost 5 pounds. Skin is warm and dry. Color is normal.  HEENT is unremarkable. Normocephalic/atraumatic. PERRL. Sclera are nonicteric. Neck is supple. No masses. No JVD. Lungs are clear. Cardiac exam shows an irregular rhythm. Her rate is ok. Abdomen is soft. Extremities are without edema. Gait and ROM are intact. No gross neurologic deficits noted.  LABORATORY DATA: Lab Results  Component Value Date   WBC 7.8 12/26/2010   HGB 14.0 12/26/2010   HCT 42.3 12/26/2010   PLT 187 12/26/2010   GLUCOSE 98 12/26/2010   CHOL  Value: 142        ATP III CLASSIFICATION:  <200     mg/dL   Desirable  [id number]  mg/dL   Borderline High  >=161    mg/dL   High        0/96/0454   TRIG 69 12/26/2010   HDL 52 12/26/2010   LDLCALC  Value: 76        Total Cholesterol/HDL:CHD Risk Coronary Heart Disease Risk Table                     Men   Women  1/2 Average Risk   3.4   3.3  Average Risk       5.0   4.4  2 X Average Risk   9.6   7.1  3 X Average Risk  23.4   11.0        Use the calculated Patient Ratio above and the CHD Risk Table to determine the patient's CHD Risk.        ATP III CLASSIFICATION (LDL):  <100     mg/dL   Optimal  098-119  mg/dL   Near or Above                    Optimal  130-159  mg/dL   Borderline  147-829  mg/dL   High  >562     mg/dL   Very High 12/14/8655   ALT 19 12/26/2010   AST 27 12/26/2010   NA 143 12/26/2010   K 3.7 12/26/2010   CL 108 12/26/2010   CREATININE 0.97 12/26/2010   BUN 14 12/26/2010   CO2 25 12/26/2010   TSH 2.781 12/26/2010   INR 2.7 11/20/2013   HGBA1C  Value: 6.0 (NOTE)                                                                        According to the ADA Clinical Practice Recommendations for 2011, when HbA1c is used as a screening test:   >=6.5%   Diagnostic of Diabetes Mellitus           (if abnormal result  is confirmed)  5.7-6.4%   Increased risk of developing Diabetes Mellitus  References:Diagnosis and Classification of Diabetes Mellitus,Diabetes Care,2011,34(Suppl 1):S62-S69 and Standards of Medical Care in         Diabetes - 2011,Diabetes Care,2011,34  (Suppl 1):S11-S61.* 12/26/2010   Lab Results  Component Value Date   INR 2.7 11/20/2013   INR 3.0 10/18/2013   INR 2.7 09/20/2013   Ecg: atrial fibrillation with rate 62 bpm. Nonspecific TWA.  Assessment / Plan: 1. Chronic atrial fib - rate is controlled but maybe too much. I have recommended reducing diltiazem to 120 mg daily. Continue metoprolol.   2. Chronic coumadin - recent INR 2.7. Needs to continue long term with history of embolic event.   3. HTN - BP looks ok.   I have also recommended reducing lasix by half ? 40 mg. Will get copy of recent lab work by Dr. Zachery Dauer. Not sure why she is on so much lasix. Will monitor for any increase in edema but I agree she is overmedicated.

## 2014-01-03 ENCOUNTER — Ambulatory Visit (INDEPENDENT_AMBULATORY_CARE_PROVIDER_SITE_OTHER): Payer: Medicare Other | Admitting: *Deleted

## 2014-01-03 ENCOUNTER — Other Ambulatory Visit: Payer: Self-pay | Admitting: Cardiology

## 2014-01-03 DIAGNOSIS — Z5181 Encounter for therapeutic drug level monitoring: Secondary | ICD-10-CM

## 2014-01-03 DIAGNOSIS — Z7189 Other specified counseling: Secondary | ICD-10-CM | POA: Insufficient documentation

## 2014-01-03 DIAGNOSIS — I4891 Unspecified atrial fibrillation: Secondary | ICD-10-CM

## 2014-01-03 LAB — POCT INR: INR: 2.6

## 2014-01-06 NOTE — Telephone Encounter (Signed)
OFFICE VISIT: 12/16/2013 metoprolol (LOPRESSOR) 100 MG tablet  Take 1 tablet (100 mg total) by mouth 2 (two) times daily.  60 tablet  Patient Instructions: Reduce diltiazem to 120 mg once a day. Reduce lasix to half of your current dose.  Continue your other therapy I will see you in 6 months. Chart Reviewed By  Charna Elizabethheryl Johnson D Pugh, LPN  on 3/2/95182/01/2014  8:50 AM

## 2014-01-13 ENCOUNTER — Telehealth: Payer: Self-pay | Admitting: Cardiology

## 2014-01-13 MED ORDER — FUROSEMIDE 40 MG PO TABS: ORAL_TABLET | ORAL | Status: DC

## 2014-01-13 MED ORDER — DILTIAZEM HCL ER COATED BEADS 120 MG PO CP24: 120.0000 mg | ORAL_CAPSULE | Freq: Every day | ORAL | Status: DC

## 2014-01-13 NOTE — Telephone Encounter (Signed)
Returned call to patient she stated she is taking Lasix 40 mg every other day and she is taking Diltiazem 120 mg daily.90 day refills sent to pharmacy.

## 2014-01-13 NOTE — Telephone Encounter (Signed)
New message ° °Patient is returning your call. Please call back.  °

## 2014-02-17 ENCOUNTER — Ambulatory Visit (INDEPENDENT_AMBULATORY_CARE_PROVIDER_SITE_OTHER): Payer: Medicare Other | Admitting: *Deleted

## 2014-02-17 DIAGNOSIS — I4891 Unspecified atrial fibrillation: Secondary | ICD-10-CM

## 2014-02-17 DIAGNOSIS — Z5181 Encounter for therapeutic drug level monitoring: Secondary | ICD-10-CM

## 2014-02-17 LAB — POCT INR: INR: 2.5

## 2014-03-21 ENCOUNTER — Other Ambulatory Visit: Payer: Self-pay | Admitting: Cardiology

## 2014-04-11 ENCOUNTER — Ambulatory Visit (INDEPENDENT_AMBULATORY_CARE_PROVIDER_SITE_OTHER): Payer: Medicare Other | Admitting: Pharmacist

## 2014-04-11 DIAGNOSIS — Z5181 Encounter for therapeutic drug level monitoring: Secondary | ICD-10-CM

## 2014-04-11 DIAGNOSIS — I4891 Unspecified atrial fibrillation: Secondary | ICD-10-CM

## 2014-04-11 LAB — POCT INR: INR: 4.2

## 2014-04-18 ENCOUNTER — Ambulatory Visit (INDEPENDENT_AMBULATORY_CARE_PROVIDER_SITE_OTHER): Payer: Medicare Other | Admitting: *Deleted

## 2014-04-18 DIAGNOSIS — I4891 Unspecified atrial fibrillation: Secondary | ICD-10-CM

## 2014-04-18 DIAGNOSIS — Z5181 Encounter for therapeutic drug level monitoring: Secondary | ICD-10-CM

## 2014-04-18 LAB — POCT INR: INR: 1.8

## 2014-04-25 ENCOUNTER — Ambulatory Visit (INDEPENDENT_AMBULATORY_CARE_PROVIDER_SITE_OTHER): Payer: Medicare Other

## 2014-04-25 DIAGNOSIS — Z5181 Encounter for therapeutic drug level monitoring: Secondary | ICD-10-CM

## 2014-04-25 DIAGNOSIS — I4891 Unspecified atrial fibrillation: Secondary | ICD-10-CM

## 2014-04-25 LAB — POCT INR: INR: 2.3

## 2014-04-30 ENCOUNTER — Other Ambulatory Visit (HOSPITAL_COMMUNITY): Payer: Self-pay | Admitting: Family Medicine

## 2014-04-30 DIAGNOSIS — Z1231 Encounter for screening mammogram for malignant neoplasm of breast: Secondary | ICD-10-CM

## 2014-05-08 ENCOUNTER — Ambulatory Visit (HOSPITAL_COMMUNITY)
Admission: RE | Admit: 2014-05-08 | Discharge: 2014-05-08 | Disposition: A | Discharge status: Home / Self Care | Payer: Medicare Other | Source: Ambulatory Visit | Attending: Family Medicine | Admitting: Family Medicine

## 2014-05-08 ENCOUNTER — Telehealth: Payer: Self-pay

## 2014-05-08 DIAGNOSIS — Z1231 Encounter for screening mammogram for malignant neoplasm of breast: Secondary | ICD-10-CM | POA: Insufficient documentation

## 2014-05-08 NOTE — Telephone Encounter (Signed)
Received message patient wants to [facility] care [facility].Spoke to patient scheduler will call to schedule appointment [facility].

## 2014-05-08 NOTE — Telephone Encounter (Signed)
Received message patient [facility].Patient called no answer.LMTC.

## 2014-05-09 ENCOUNTER — Ambulatory Visit (INDEPENDENT_AMBULATORY_CARE_PROVIDER_SITE_OTHER): Payer: Medicare Other

## 2014-05-09 DIAGNOSIS — Z5181 Encounter for therapeutic drug level monitoring: Secondary | ICD-10-CM

## 2014-05-09 DIAGNOSIS — I4891 Unspecified atrial fibrillation: Secondary | ICD-10-CM

## 2014-05-09 LAB — POCT INR: INR: 2.6

## 2014-06-06 ENCOUNTER — Ambulatory Visit (INDEPENDENT_AMBULATORY_CARE_PROVIDER_SITE_OTHER): Payer: Medicare Other | Admitting: Pharmacist

## 2014-06-06 DIAGNOSIS — Z5181 Encounter for therapeutic drug level monitoring: Secondary | ICD-10-CM

## 2014-06-06 DIAGNOSIS — I4891 Unspecified atrial fibrillation: Secondary | ICD-10-CM

## 2014-06-06 LAB — POCT INR: INR: 2.6

## 2014-06-24 ENCOUNTER — Encounter: Payer: Medicare Other | Admitting: Cardiology

## 2014-07-02 ENCOUNTER — Ambulatory Visit: Payer: Medicare Other | Admitting: Cardiology

## 2014-07-04 ENCOUNTER — Ambulatory Visit (INDEPENDENT_AMBULATORY_CARE_PROVIDER_SITE_OTHER): Payer: Medicare Other | Admitting: *Deleted

## 2014-07-04 DIAGNOSIS — I4891 Unspecified atrial fibrillation: Secondary | ICD-10-CM

## 2014-07-04 DIAGNOSIS — Z5181 Encounter for therapeutic drug level monitoring: Secondary | ICD-10-CM

## 2014-07-04 LAB — POCT INR: INR: 1.8

## 2014-07-06 ENCOUNTER — Other Ambulatory Visit: Payer: Self-pay | Admitting: Cardiology

## 2014-07-25 ENCOUNTER — Encounter: Payer: Self-pay | Admitting: Cardiology

## 2014-07-25 ENCOUNTER — Ambulatory Visit (INDEPENDENT_AMBULATORY_CARE_PROVIDER_SITE_OTHER): Payer: Medicare Other | Admitting: Cardiology

## 2014-07-25 ENCOUNTER — Ambulatory Visit (INDEPENDENT_AMBULATORY_CARE_PROVIDER_SITE_OTHER): Payer: Medicare Other

## 2014-07-25 VITALS — BP 148/90 | HR 75 | Ht 63.0 in | Wt 202.0 lb

## 2014-07-25 DIAGNOSIS — I519 Heart disease, unspecified: Secondary | ICD-10-CM

## 2014-07-25 DIAGNOSIS — I4891 Unspecified atrial fibrillation: Secondary | ICD-10-CM

## 2014-07-25 DIAGNOSIS — I10 Essential (primary) hypertension: Secondary | ICD-10-CM

## 2014-07-25 DIAGNOSIS — Z5181 Encounter for therapeutic drug level monitoring: Secondary | ICD-10-CM

## 2014-07-25 DIAGNOSIS — I482 Chronic atrial fibrillation, unspecified: Secondary | ICD-10-CM

## 2014-07-25 LAB — POCT INR: INR: 3.1

## 2014-07-25 MED ORDER — METOPROLOL TARTRATE 100 MG PO TABS: ORAL_TABLET | ORAL | Status: DC

## 2014-07-25 NOTE — Assessment & Plan Note (Signed)
The patient has a history of hypertension.  She is on medication.  Normally at home her blood pressure is normal.  She has not had any headaches or dizziness.

## 2014-07-25 NOTE — Progress Notes (Signed)
Joanna [name] Date of Birth:  03-22-1941 [facility] [address] [place], [place]  [address] [phone]        Fax   [phone]   History of Present Illness: This pleasant 73 year old [place] American woman is seen by me for the first time today.  She is a former patient of Dr. Peter Swaziland.  She did not wish to transition to the [employer].  She has a past history of chronic permanent atrial fibrillation.  She is a long-term warfarin.  She's had a prior history of a middle cerebral artery CVA in 2012.  She retired from work after that stroke.  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 She has a past history of hypertension.  She has had a previous history of left ventricular dysfunction but most recent ejection fraction in 2012 was 50-55%  Current Outpatient Prescriptions  Medication Sig Dispense Refill  . diltiazem (CARDIZEM CD) 120 MG 24 hr capsule Take 1 capsule (120 mg total) by mouth daily.  90 capsule  3  . furosemide (LASIX) 40 MG tablet Take 40 mg every other day  45 tablet  3  . metoprolol (LOPRESSOR) 100 MG tablet TAKE ONE TABLET BY MOUTH TWICE DAILY  180 tablet  3  . Multiple Vitamin (MULTI-VITAMIN PO) Take by mouth daily.        Marland Kitchen warfarin (COUMADIN) 5 MG tablet Take 1 to 1.5 tablets by mouth daily as directed by coumadin clinic  120 tablet  1   No current facility-administered medications for this visit.    No Known Allergies  Patient Active Problem List   Diagnosis Date Noted  . Encounter for therapeutic drug monitoring 01/03/2014  . Left ventricular dysfunction   . Hypertension   . Atrial fibrillation 02/04/2011  . Stroke 12/15/2010    History  Smoking status  . Never Smoker   Smokeless tobacco  . Not on file    History  Alcohol Use No    Family History  Problem Relation Age of Onset  . Heart disease Mother   . Hypertension Sister     Review of  Systems: Constitutional: no fever chills diaphoresis or fatigue or change in weight.  Head and neck: no hearing loss, no epistaxis, no photophobia or visual disturbance. Respiratory: No cough, shortness of breath or wheezing. Cardiovascular: No chest pain peripheral edema, palpitations. Gastrointestinal: No abdominal distention, no abdominal pain, no change in bowel habits hematochezia or melena. Genitourinary: No dysuria, no frequency, no urgency, no nocturia. Musculoskeletal:No arthralgias, no back pain, no gait disturbance or myalgias. Neurological: No dizziness, no headaches, no numbness, no seizures, no syncope, no weakness, no tremors. Hematologic: No lymphadenopathy, no easy bruising. Psychiatric: No confusion, no hallucinations, no sleep disturbance.    Physical Exam: Filed Vitals:   07/25/14 1556  BP: 148/90  Pulse: 75  The patient appears to be in no distress.  Head and neck exam reveals that the pupils are equal and reactive.  The extraocular movements are full.  There is no scleral icterus.  Mouth and pharynx are benign.  No lymphadenopathy.  No carotid bruits.  The jugular venous pressure is normal.  Thyroid is not enlarged or tender.  Chest is clear to percussion and auscultation.  No rales or rhonchi.  Expansion of the chest is symmetrical.  Heart reveals no abnormal lift or heave.  First and second heart sounds are normal.  The pulse  is irregularly irregular.  There is no murmur gallop rub or click.  The abdomen is soft and nontender.  Bowel sounds are normoactive.  There is no hepatosplenomegaly or mass.  There are no abdominal bruits.  Extremities reveal no phlebitis or edema.  Pedal pulses are good.  There is no cyanosis or clubbing.  Neurologic exam is normal strength and no lateralizing weakness.  No sensory deficits.  Integument reveals no rash    Assessment / Plan: 1. permanent atrial fibrillation 2. past history of CVA secondary to embolus to the left  middle cerebral artery 3. hypertensive heart disease without heart failure. 4. prior left ventricular dysfunction but more recent echo showing ejection fraction of 55%  Disposition: Continue on same medication.  Her labs are followed by her PCP Dr. Hassie Bruce in 6 months for office visit and EKG

## 2014-07-25 NOTE — Assessment & Plan Note (Signed)
The patient is in permanent atrial fibrillation.  She is in long-term anticoagulation with warfarin.  She has had no subsequent TIA or stroke symptoms.

## 2014-07-25 NOTE — Assessment & Plan Note (Signed)
She is not having symptoms of CHF.  She takes  40 mg Lasix every other day sometimes she stretches it to every third day.  She has a brisk diuretic effect from the Lasix

## 2014-07-25 NOTE — Patient Instructions (Signed)
Your physician wants you to follow-up in: 6 month ov/ekg  You will receive a reminder letter in the mail two months in advance. If you don't receive a letter, please call our office to schedule the follow-up appointment.  Your physician recommends that you continue on your current medications as directed. Please refer to the Current Medication list given to you today.

## 2014-08-01 ENCOUNTER — Encounter: Payer: Self-pay | Admitting: *Deleted

## 2014-08-22 ENCOUNTER — Ambulatory Visit (INDEPENDENT_AMBULATORY_CARE_PROVIDER_SITE_OTHER): Payer: Medicare Other | Admitting: Pharmacist

## 2014-08-22 DIAGNOSIS — I4891 Unspecified atrial fibrillation: Secondary | ICD-10-CM

## 2014-08-22 DIAGNOSIS — Z5181 Encounter for therapeutic drug level monitoring: Secondary | ICD-10-CM

## 2014-08-22 LAB — POCT INR: INR: 2.9

## 2014-10-03 ENCOUNTER — Ambulatory Visit (INDEPENDENT_AMBULATORY_CARE_PROVIDER_SITE_OTHER): Payer: Medicare Other | Admitting: *Deleted

## 2014-10-03 DIAGNOSIS — I4891 Unspecified atrial fibrillation: Secondary | ICD-10-CM

## 2014-10-03 DIAGNOSIS — Z5181 Encounter for therapeutic drug level monitoring: Secondary | ICD-10-CM

## 2014-10-03 LAB — POCT INR: INR: 2.2

## 2014-11-03 ENCOUNTER — Ambulatory Visit (INDEPENDENT_AMBULATORY_CARE_PROVIDER_SITE_OTHER): Payer: Medicare Other | Admitting: Pharmacist

## 2014-11-03 DIAGNOSIS — I4891 Unspecified atrial fibrillation: Secondary | ICD-10-CM

## 2014-11-03 DIAGNOSIS — Z5181 Encounter for therapeutic drug level monitoring: Secondary | ICD-10-CM

## 2014-11-03 LAB — POCT INR: INR: 3.3

## 2014-11-24 ENCOUNTER — Ambulatory Visit (INDEPENDENT_AMBULATORY_CARE_PROVIDER_SITE_OTHER): Payer: Medicare Other

## 2014-11-24 DIAGNOSIS — I4891 Unspecified atrial fibrillation: Secondary | ICD-10-CM

## 2014-11-24 DIAGNOSIS — Z5181 Encounter for therapeutic drug level monitoring: Secondary | ICD-10-CM

## 2014-11-24 LAB — POCT INR: INR: 3.3

## 2014-12-12 ENCOUNTER — Ambulatory Visit (INDEPENDENT_AMBULATORY_CARE_PROVIDER_SITE_OTHER): Payer: Medicare Other | Admitting: *Deleted

## 2014-12-12 DIAGNOSIS — I4891 Unspecified atrial fibrillation: Secondary | ICD-10-CM

## 2014-12-12 DIAGNOSIS — Z5181 Encounter for therapeutic drug level monitoring: Secondary | ICD-10-CM

## 2014-12-12 LAB — POCT INR: INR: 2.3

## 2015-01-02 ENCOUNTER — Ambulatory Visit (INDEPENDENT_AMBULATORY_CARE_PROVIDER_SITE_OTHER): Payer: Medicare Other | Admitting: *Deleted

## 2015-01-02 DIAGNOSIS — I4891 Unspecified atrial fibrillation: Secondary | ICD-10-CM

## 2015-01-02 DIAGNOSIS — Z5181 Encounter for therapeutic drug level monitoring: Secondary | ICD-10-CM

## 2015-01-02 LAB — POCT INR: INR: 3.3

## 2015-01-21 ENCOUNTER — Encounter: Payer: Self-pay | Admitting: Cardiology

## 2015-01-21 ENCOUNTER — Ambulatory Visit (INDEPENDENT_AMBULATORY_CARE_PROVIDER_SITE_OTHER): Payer: Medicare Other | Admitting: *Deleted

## 2015-01-21 ENCOUNTER — Ambulatory Visit (INDEPENDENT_AMBULATORY_CARE_PROVIDER_SITE_OTHER): Payer: Medicare Other | Admitting: Cardiology

## 2015-01-21 VITALS — BP 154/90 | HR 73 | Ht 63.0 in | Wt 204.2 lb

## 2015-01-21 DIAGNOSIS — I4891 Unspecified atrial fibrillation: Secondary | ICD-10-CM

## 2015-01-21 DIAGNOSIS — I482 Chronic atrial fibrillation, unspecified: Secondary | ICD-10-CM

## 2015-01-21 DIAGNOSIS — I10 Essential (primary) hypertension: Secondary | ICD-10-CM

## 2015-01-21 DIAGNOSIS — Z5181 Encounter for therapeutic drug level monitoring: Secondary | ICD-10-CM

## 2015-01-21 LAB — POCT INR: INR: 2.1

## 2015-01-21 NOTE — Patient Instructions (Signed)
Work harder on diet and weight loss  Your physician recommends that you continue on your current medications as directed. Please refer to the Current Medication list given to you today.  Your physician wants you to follow-up in: 6 month  You will receive a reminder letter in the mail two months in advance. If you don't receive a letter, please call our office to schedule the follow-up appointment.   Decrease your salt

## 2015-01-21 NOTE — Progress Notes (Signed)
Cardiology Office Note   Date:  01/21/2015   ID:  Joanna [name], DOB [birth year], MRN [id number]  PCP:  Gaye Alken, MD  Cardiologist:   Cassell Clement, MD   No chief complaint on file.     History of Present Illness: Joanna [name] is a 74 y.o. female who presents for 6 month follow-up visit.  This pleasant 74 year old African American woman is seen or a follow-up office visit.. She is a former patient of Dr. Peter [place]. She did not wish to transition to the [employer]. She has a past history of chronic permanent atrial fibrillation. She is a long-term warfarin. She's had a prior history of a middle cerebral artery CVA in 2012. She retired from work after that stroke. 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 She has a past history of hypertension. She has had a previous history of left ventricular dysfunction but most recent ejection fraction in 2012 was 50-55%. His last visit she has been stable.  She has not been having any chest pain or shortness of breath.  She has not had any TIA symptoms.  She does have nocturia 3-4 times a night.  She has had lightheadedness at times.  On visit she can cut back on some of her medications.  However her blood pressure is running slightly high.  She has not been limiting her salt adequately.  Past Medical History  Diagnosis Date  . Chronic atrial fibrillation   . Ischemic bowel disease     With GI bleeding  . Splenic infarct   . Left ventricular dysfunction     EF is now 50-55% as of 2012  . Stroke February 2012    Now back on coumadin  . Diverticulosis   . Hypertension   . Obesity   . Aphasia 2012  . Dysnomia 12/2010  . Chronic kidney disease   . Acute renal failure 02/23/2007-03/22/2007    Now with normalization back to baseline    Past Surgical History  Procedure Laterality Date  . Ileostomy    . Colectomy  12/2010    partial  . Omentectomy     partial with ileostomy     Current Outpatient Prescriptions  Medication Sig Dispense Refill  . diltiazem (CARDIZEM CD) 120 MG 24 hr capsule Take 1 capsule (120 mg total) by mouth daily. 90 capsule 3  . furosemide (LASIX) 40 MG tablet Take 40 mg every other day 45 tablet 3  . metoprolol (LOPRESSOR) 100 MG tablet TAKE ONE TABLET BY MOUTH TWICE DAILY 180 tablet 3  . Multiple Vitamin (MULTI-VITAMIN PO) Take by mouth daily.      Marland Kitchen warfarin (COUMADIN) 5 MG tablet Take 1 to 1.5 tablets by mouth daily as directed by coumadin clinic 120 tablet 1   No current facility-administered medications for this visit.    Allergies:   Review of patient's allergies indicates no known allergies.    Social History:  The patient  reports that she has never smoked. She does not have any smokeless tobacco history on file. She reports that she does not drink alcohol.   Family History:  [name] family history includes Heart disease in her mother; Hypertension in her sister.    ROS:  Please see the history of present illness.   Otherwise, review of systems are positive for none.   All other systems are reviewed and negative.    PHYSICAL EXAM: VS:  BP 154/90  mmHg  Pulse 73  Ht 5\' 3"  (1.6 m)  Wt 204 lb 3.2 oz (92.625 kg)  BMI 36.18 kg/m2 , BMI Body mass index is 36.18 kg/(m^2). GEN: Well nourished, well developed, in no acute distress HEENT: normal Neck: no JVD, carotid bruits, or masses Cardiac: RRR; no murmurs, rubs, or gallops,no edema  Respiratory:  clear to auscultation bilaterally, normal work of breathing GI: soft, nontender, nondistended, + BS MS: no deformity or atrophy Skin: warm and dry, no rash Neuro:  Strength and sensation are intact Psych: euthymic mood, full affect   EKG:  EKG is ordered today. The ekg ordered today demonstrates atrial fibrillation with controlled ventricular response.  Nonspecific T-wave changes.  No significant change since previous tracing of 12/16/13.  Personally  reviewed.   Recent Labs: No results found for requested labs within last 365 days.    Lipid Panel    Component Value Date/Time   CHOL  12/26/2010 0403    142        ATP III CLASSIFICATION:  <200     mg/dL   Desirable  161-096200-239  mg/dL   Borderline High  >=045>=240    mg/dL   High          TRIG 69 12/26/2010 0403   HDL 52 12/26/2010 0403   CHOLHDL 2.7 12/26/2010 0403   VLDL 14 12/26/2010 0403   LDLCALC  12/26/2010 0403    76        Total Cholesterol/HDL:CHD Risk Coronary Heart Disease Risk Table                     Men   Women  1/2 Average Risk   3.4   3.3  Average Risk       5.0   4.4  2 X Average Risk   9.6   7.1  3 X Average Risk  23.4   11.0        Use the calculated Patient Ratio above and the CHD Risk Table to determine the patient's CHD Risk.        ATP III CLASSIFICATION (LDL):  <100     mg/dL   Optimal  409-811100-129  mg/dL   Near or Above                    Optimal  130-159  mg/dL   Borderline  [id number]  mg/dL   High  >956>190     mg/dL   Very High      Wt Readings from Last 3 Encounters:  01/21/15 204 lb 3.2 oz (92.625 kg)  07/25/14 202 lb (91.627 kg)  12/16/13 203 lb (92.08 kg)         ASSESSMENT AND PLAN:  1. permanent atrial fibrillation 2. past history of CVA secondary to embolus to the left middle cerebral artery 3. hypertensive heart disease without heart failure. 4. prior left ventricular dysfunction but more recent echo showing ejection fraction of 55%  Disposition: Continue on same medication. Her labs are followed by her PCP Dr. Zachery DauerBarnes.  She will watch her dietary salt better. Recheck in 6 months for office visit.   Current medicines are reviewed at length with the patient today.  The patient does not have concerns regarding medicines.  The following changes have been made:  no change  Labs/ tests ordered today include:   Orders Placed This Encounter  Procedures  . EKG 12-Lead     Disposition:   FU with Dr. Patty SermonsBrackbill in  6  months   Signed, Cassell Clement, MD  01/21/2015 5:37 PM    [facility] [address] [place], [place], [place]  [address] Phone: [phone]; Fax: [phone]

## 2015-02-02 ENCOUNTER — Other Ambulatory Visit: Payer: Self-pay | Admitting: *Deleted

## 2015-02-02 MED ORDER — WARFARIN SODIUM 5 MG PO TABS: ORAL_TABLET | ORAL | Status: DC

## 2015-02-05 ENCOUNTER — Other Ambulatory Visit: Payer: Self-pay

## 2015-02-05 MED ORDER — DILTIAZEM HCL ER COATED BEADS 120 MG PO CP24: 120.0000 mg | ORAL_CAPSULE | Freq: Every day | ORAL | Status: DC

## 2015-02-20 ENCOUNTER — Ambulatory Visit (INDEPENDENT_AMBULATORY_CARE_PROVIDER_SITE_OTHER): Payer: Medicare Other | Admitting: *Deleted

## 2015-02-20 DIAGNOSIS — I482 Chronic atrial fibrillation, unspecified: Secondary | ICD-10-CM

## 2015-02-20 DIAGNOSIS — Z5181 Encounter for therapeutic drug level monitoring: Secondary | ICD-10-CM

## 2015-02-20 DIAGNOSIS — I4891 Unspecified atrial fibrillation: Secondary | ICD-10-CM

## 2015-02-20 LAB — POCT INR: INR: 2.2

## 2015-03-27 ENCOUNTER — Ambulatory Visit (INDEPENDENT_AMBULATORY_CARE_PROVIDER_SITE_OTHER): Payer: Medicare Other | Admitting: *Deleted

## 2015-03-27 DIAGNOSIS — Z5181 Encounter for therapeutic drug level monitoring: Secondary | ICD-10-CM | POA: Diagnosis not present

## 2015-03-27 DIAGNOSIS — I482 Chronic atrial fibrillation, unspecified: Secondary | ICD-10-CM

## 2015-03-27 DIAGNOSIS — I4891 Unspecified atrial fibrillation: Secondary | ICD-10-CM | POA: Diagnosis not present

## 2015-03-27 LAB — POCT INR: INR: 2.7

## 2015-05-15 ENCOUNTER — Ambulatory Visit (INDEPENDENT_AMBULATORY_CARE_PROVIDER_SITE_OTHER): Payer: Medicare Other | Admitting: *Deleted

## 2015-05-15 DIAGNOSIS — Z5181 Encounter for therapeutic drug level monitoring: Secondary | ICD-10-CM

## 2015-05-15 DIAGNOSIS — I4891 Unspecified atrial fibrillation: Secondary | ICD-10-CM | POA: Diagnosis not present

## 2015-05-15 DIAGNOSIS — I482 Chronic atrial fibrillation, unspecified: Secondary | ICD-10-CM

## 2015-05-15 LAB — POCT INR: INR: 2.6

## 2015-07-30 ENCOUNTER — Other Ambulatory Visit: Payer: Self-pay | Admitting: Cardiology

## 2015-09-01 ENCOUNTER — Ambulatory Visit (INDEPENDENT_AMBULATORY_CARE_PROVIDER_SITE_OTHER): Payer: Medicare Other | Admitting: Nurse Practitioner

## 2015-09-01 ENCOUNTER — Telehealth: Payer: Self-pay | Admitting: Cardiology

## 2015-09-01 ENCOUNTER — Ambulatory Visit (INDEPENDENT_AMBULATORY_CARE_PROVIDER_SITE_OTHER): Payer: Medicare Other | Admitting: Pharmacist

## 2015-09-01 ENCOUNTER — Encounter: Payer: Self-pay | Admitting: Nurse Practitioner

## 2015-09-01 VITALS — BP 150/90 | HR 84 | Ht 63.5 in | Wt 211.1 lb

## 2015-09-01 DIAGNOSIS — R0600 Dyspnea, unspecified: Secondary | ICD-10-CM | POA: Diagnosis not present

## 2015-09-01 DIAGNOSIS — I482 Chronic atrial fibrillation, unspecified: Secondary | ICD-10-CM

## 2015-09-01 DIAGNOSIS — I4891 Unspecified atrial fibrillation: Secondary | ICD-10-CM | POA: Diagnosis not present

## 2015-09-01 DIAGNOSIS — Z5181 Encounter for therapeutic drug level monitoring: Secondary | ICD-10-CM | POA: Diagnosis not present

## 2015-09-01 DIAGNOSIS — I10 Essential (primary) hypertension: Secondary | ICD-10-CM | POA: Diagnosis not present

## 2015-09-01 LAB — CBC
HCT: 40.5 % (ref 36.0–46.0)
Hemoglobin: 13.2 g/dL (ref 12.0–15.0)
MCH: 32.3 pg (ref 26.0–34.0)
MCHC: 32.6 g/dL (ref 30.0–36.0)
MCV: 99 fL (ref 78.0–100.0)
MPV: 10.2 fL (ref 8.6–12.4)
Platelets: 187 10*3/uL (ref 150–400)
RBC: 4.09 MIL/uL (ref 3.87–5.11)
RDW: 14.2 % (ref 11.5–15.5)
WBC: 4.1 10*3/uL (ref 4.0–10.5)

## 2015-09-01 LAB — BASIC METABOLIC PANEL
BUN: 11 mg/dL (ref 7–25)
CO2: 25 mmol/L (ref 20–31)
Calcium: 9.4 mg/dL (ref 8.6–10.4)
Chloride: 103 mmol/L (ref 98–110)
Creat: 0.94 mg/dL — ABNORMAL HIGH (ref 0.60–0.93)
Glucose, Bld: 98 mg/dL (ref 65–99)
Potassium: 3.9 mmol/L (ref 3.5–5.3)
Sodium: 140 mmol/L (ref 135–146)

## 2015-09-01 LAB — POCT INR: INR: 2.3

## 2015-09-01 MED ORDER — WARFARIN SODIUM 5 MG PO TABS: ORAL_TABLET | ORAL | Status: DC

## 2015-09-01 MED ORDER — FUROSEMIDE 40 MG PO TABS
40.0000 mg | ORAL_TABLET | Freq: Every day | ORAL | Status: DC
Start: 2015-09-01 — End: 2015-09-02

## 2015-09-01 MED ORDER — METOPROLOL TARTRATE 100 MG PO TABS: 100.0000 mg | ORAL_TABLET | Freq: Two times a day (BID) | ORAL | Status: DC

## 2015-09-01 MED ORDER — DILTIAZEM HCL ER COATED BEADS 120 MG PO CP24: 120.0000 mg | ORAL_CAPSULE | Freq: Every day | ORAL | Status: DC

## 2015-09-01 NOTE — Patient Instructions (Addendum)
We will be checking the following labs today - BMET, BNP and CBC  We will need to check your protime - this has to be checked every day   Medication Instructions:    Continue with your current medicines. I have refilled your Metoprolol  Start taking the Lasix every day    Testing/Procedures To Be Arranged:  Echocardiogram  Follow-Up:   See Dr. Patty SermonsBrackbill in just a couple of weeks   Other Special Instructions:   Cut back on your salt  Call the [facility] office at [phone] if you have any questions, problems or concerns.

## 2015-09-01 NOTE — Progress Notes (Signed)
CARDIOLOGY OFFICE NOTE  Date:  09/01/2015    Joanna [name] Date of Birth: Jan 28, 1941 Medical Record #[id number]  PCP:  Gaye AlkenBARNES,ELIZABETH STEWART, MD  Cardiologist:  Patty SermonsBrackbill    Chief Complaint  Patient presents with  . Atrial Fibrillation    6 month check - seen for Dr. Patty SermonsBrackbill    History of Present Illness: Joanna Riffleatricia A Chaikin is a 74 y.o. female who presents today for a 6 month check. Seen for Dr. Patty SermonsBrackbill. She is a former patient of Dr. Peter SwazilandJordan. She did not wish to transition to the [facility] office. She has a past history of chronic permanent atrial fibrillation. She is a long-term warfarin. Remote GI bleed noted in the history. She's had a prior history of a middle cerebral artery CVA in 2012. She retired from work after that stroke. 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 She has a past history of hypertension. She has had a previous history of left ventricular dysfunction but Dr. Yevonne PaxBrackbill's last note mentions an ejection fraction from 2012 at being 50-55%.  Last seen in March - felt to be doing ok. BP up some - had not been restricting her salt.   Comes back today. Here with her daughter. They said they were here due to get some medicine refilled. Has ran out of her metoprolol. Does not look like her remaining medicines really match up and is probably out of those as well. More swelling. More short of breath. Getting too much salt. No chest pain. No coumadin check since July. Asking for medicine for nausea.    Past Medical History  Diagnosis Date  . Chronic atrial fibrillation (HCC)   . Ischemic bowel disease (HCC)     With GI bleeding  . Splenic infarct   . Left ventricular dysfunction     EF is now 50-55% as of 2012  . Stroke [facility](HCC) February 2012    Now back on coumadin  . Diverticulosis   . Hypertension   . Obesity   . Aphasia 2012  . Dysnomia 12/2010  . Chronic kidney disease   . Acute renal  failure (HCC) 02/23/2007-03/22/2007    Now with normalization back to baseline    Past Surgical History  Procedure Laterality Date  . Ileostomy    . Colectomy  12/2010    partial  . Omentectomy      partial with ileostomy     Medications: Current Outpatient Prescriptions  Medication Sig Dispense Refill  . diltiazem (CARTIA XT) 120 MG 24 hr capsule Take 1 capsule (120 mg total) by mouth daily. Appointment needed for future refills 90 capsule 3  . furosemide (LASIX) 40 MG tablet Take 1 tablet (40 mg total) by mouth daily. Take 40 mg every other day 90 tablet 3  . metoprolol (LOPRESSOR) 100 MG tablet Take 1 tablet (100 mg total) by mouth 2 (two) times daily. 60 tablet 11  . Multiple Vitamin (MULTI-VITAMIN PO) Take by mouth daily.      Marland Kitchen. warfarin (COUMADIN) 5 MG tablet Take 1 to 1.5 tablets by mouth daily as directed by coumadin clinic 120 tablet 2   No current facility-administered medications for this visit.    Allergies: No Known Allergies  Social History: The patient  reports that she has never smoked. She does not have any smokeless tobacco history on file. She reports that she does not drink alcohol.   Family History: [name] family history includes Heart  disease in her mother; Hypertension in her sister.   Review of Systems: Please see the history of present illness.   Otherwise, the review of systems is positive for swelling, dyspnea and nausea.   All other systems are reviewed and negative.   Physical Exam: VS:  BP 150/90 mmHg  Pulse 84  Ht 5' 3.5" (1.613 m)  Wt 211 lb 1.9 oz (95.763 kg)  BMI 36.81 kg/m2  SpO2 90% .  BMI Body mass index is 36.81 kg/(m^2).  Wt Readings from Last 3 Encounters:  09/01/15 211 lb 1.9 oz (95.763 kg)  01/21/15 204 lb 3.2 oz (92.625 kg)  07/25/14 202 lb (91.627 kg)    General: Elderly black female. She is alert and in no acute distress.  HEENT: Normal. Neck: Supple, no JVD, carotid bruits, or masses noted.  Cardiac: Irregular  irregular rhythm. Rate is ok. No murmurs, rubs, or gallops. Over 2+ edema.  Respiratory:  Lungs are fairly clear to auscultation bilaterally with normal work of breathing.  GI: Soft and nontender.  MS: No deformity or atrophy. Gait and ROM intact. Skin: Warm and dry. Color is normal.  Neuro:  Strength and sensation are intact and no gross focal deficits noted.  Psych: Alert, appropriate and with normal affect.   LABORATORY DATA:  EKG:  EKG is not ordered today.   Lab Results  Component Value Date   WBC 7.8 12/26/2010   HGB 14.0 12/26/2010   HCT 42.3 12/26/2010   PLT 187 12/26/2010   GLUCOSE 98 12/26/2010   CHOL  12/26/2010    142        ATP III CLASSIFICATION:  <200     mg/dL   Desirable  191-478  mg/dL   Borderline High  >=295    mg/dL   High          TRIG 69 12/26/2010   HDL 52 12/26/2010   LDLCALC  12/26/2010    76        Total Cholesterol/HDL:CHD Risk Coronary Heart Disease Risk Table                     Men   Women  1/2 Average Risk   3.4   3.3  Average Risk       5.0   4.4  2 X Average Risk   9.6   7.1  3 X Average Risk  23.4   11.0        Use the calculated Patient Ratio above and the CHD Risk Table to determine the patient's CHD Risk.        ATP III CLASSIFICATION (LDL):  <100     mg/dL   Optimal  621-308  mg/dL   Near or Above                    Optimal  130-159  mg/dL   Borderline  657-846  mg/dL   High  >962     mg/dL   Very High   ALT 19 95/28/4132   AST 27 12/26/2010   NA 143 12/26/2010   K 3.7 12/26/2010   CL 108 12/26/2010   CREATININE 0.97 12/26/2010   BUN 14 12/26/2010   CO2 25 12/26/2010   TSH 2.781 12/26/2010   INR 2.6 05/15/2015   HGBA1C * 12/26/2010    6.0 (NOTE)  According to the ADA Clinical Practice Recommendations for 2011, when HbA1c is used as a screening test:   >=6.5%   Diagnostic of Diabetes Mellitus           (if abnormal result  is confirmed)  5.7-6.4%    Increased risk of developing Diabetes Mellitus  References:Diagnosis and Classification of Diabetes Mellitus,Diabetes Care,2011,34(Suppl 1):S62-S69 and Standards of Medical Care in         Diabetes - 2011,Diabetes Care,2011,34  (Suppl 1):S11-S61.    BNP (last 3 results) No results for input(s): BNP in the last 8760 hours.  ProBNP (last 3 results) No results for input(s): PROBNP in the last 8760 hours.  Lab Results  Component Value Date   INR 2.6 05/15/2015   INR 2.7 03/27/2015   INR 2.2 02/20/2015     Other Studies Reviewed Today:  ECHO SUMMARY 2008 - Overall left ventricular systolic function was mildly decreased.    Left ventricular ejection fraction was estimated , range    being 45 % to 50 %. There was no diagnostic evidence of left    ventricular regional wall motion abnormalities. Left    ventricular wall thickness was at the upper limits of normal. - Mild atherosclerosis. - There was mild mitral valvular regurgitation. - The left atrium was mildly dilated. The left atrial appendage    function was normal (normal emptying velocity). There was no    left atrial appendage thrombus identified. There was a patent    foramen ovale. Bidirectional shunt. - The right ventricle was moderately dilated. - There was moderate to severe tricuspid valvular regurgitation. - The right atrium was markedly dilated.  Assessment/Plan: 1. Chronic atrial fib - to manage with rate control and anticoagulation - no INR in over 3 months. Explained the importance of checking regularly. Refilled her medicines today.   2. Chronic coumadin therapy - will check today.   3. HTN - BP up some - I suspect she is not taking any of her medicines. Refilled today.   4. HLD  5. Mild LV dysfunction - last echo that I see is from 2008 with noted EF of 45 to 50% - now with more swelling and DOE - will get echo updated. Lasix increased to every day. Needs to cut back  on the salt. See Dr. Patty Sermons back in a couple of weeks to recheck. She is nauseated - this may be from CHF - will see how she responds with diuresis.  6. Noncompliance - need family to help with medication and lab compliance.   Current medicines are reviewed with the patient today.  The patient does not have concerns regarding medicines other than what has been noted above.  The following changes have been made:  See above.  Labs/ tests ordered today include:    Orders Placed This Encounter  Procedures  . Basic metabolic panel  . Brain natriuretic peptide  . CBC  . ECHOCARDIOGRAM COMPLETE     Disposition:   FU with Dr. Patty Sermons in a couple of weeks after studies complete to recheck.    Patient is agreeable to this plan and will call if any problems develop in the interim.   Signed: Rosalio Macadamia, RN, ANP-C 09/01/2015 12:21 PM  [facility] [address] [place], [place]  [address] Phone: [phone] Fax: [phone]

## 2015-09-01 NOTE — Telephone Encounter (Signed)
°  STAT if patient is at the pharmacy , call can be transferred to refill team.   1. Which medications need to be refilled? Furosemide 40mg   2. Which pharmacy/location is medication to be sent to?[facility]  3. Do they need a 30 day or 90 day supply? Need to verify what the directions need to be because it wasn't called in correctly

## 2015-09-02 ENCOUNTER — Ambulatory Visit (HOSPITAL_COMMUNITY): Payer: Medicare Other | Attending: Cardiology

## 2015-09-02 ENCOUNTER — Other Ambulatory Visit: Payer: Self-pay | Admitting: *Deleted

## 2015-09-02 ENCOUNTER — Other Ambulatory Visit: Payer: Self-pay

## 2015-09-02 DIAGNOSIS — N189 Chronic kidney disease, unspecified: Secondary | ICD-10-CM | POA: Insufficient documentation

## 2015-09-02 DIAGNOSIS — I313 Pericardial effusion (noninflammatory): Secondary | ICD-10-CM | POA: Insufficient documentation

## 2015-09-02 DIAGNOSIS — Z6837 Body mass index (BMI) 37.0-37.9, adult: Secondary | ICD-10-CM | POA: Diagnosis not present

## 2015-09-02 DIAGNOSIS — E669 Obesity, unspecified: Secondary | ICD-10-CM | POA: Diagnosis not present

## 2015-09-02 DIAGNOSIS — R0600 Dyspnea, unspecified: Secondary | ICD-10-CM

## 2015-09-02 DIAGNOSIS — I071 Rheumatic tricuspid insufficiency: Secondary | ICD-10-CM | POA: Diagnosis not present

## 2015-09-02 DIAGNOSIS — I129 Hypertensive chronic kidney disease with stage 1 through stage 4 chronic kidney disease, or unspecified chronic kidney disease: Secondary | ICD-10-CM | POA: Diagnosis not present

## 2015-09-02 DIAGNOSIS — I10 Essential (primary) hypertension: Secondary | ICD-10-CM

## 2015-09-02 DIAGNOSIS — I517 Cardiomegaly: Secondary | ICD-10-CM | POA: Diagnosis not present

## 2015-09-02 DIAGNOSIS — I482 Chronic atrial fibrillation, unspecified: Secondary | ICD-10-CM

## 2015-09-02 DIAGNOSIS — Z5181 Encounter for therapeutic drug level monitoring: Secondary | ICD-10-CM | POA: Diagnosis not present

## 2015-09-02 LAB — BRAIN NATRIURETIC PEPTIDE: Brain Natriuretic Peptide: 789 pg/mL — ABNORMAL HIGH (ref 0.0–100.0)

## 2015-09-02 MED ORDER — FUROSEMIDE 40 MG PO TABS: 40.0000 mg | ORAL_TABLET | Freq: Every day | ORAL | Status: DC

## 2015-09-17 ENCOUNTER — Encounter: Payer: Self-pay | Admitting: Cardiology

## 2015-09-17 ENCOUNTER — Ambulatory Visit (INDEPENDENT_AMBULATORY_CARE_PROVIDER_SITE_OTHER): Payer: Medicare Other | Admitting: Cardiology

## 2015-09-17 VITALS — BP 150/100 | HR 81 | Ht 63.5 in | Wt 197.1 lb

## 2015-09-17 DIAGNOSIS — R0600 Dyspnea, unspecified: Secondary | ICD-10-CM | POA: Diagnosis not present

## 2015-09-17 DIAGNOSIS — I482 Chronic atrial fibrillation, unspecified: Secondary | ICD-10-CM

## 2015-09-17 DIAGNOSIS — I519 Heart disease, unspecified: Secondary | ICD-10-CM

## 2015-09-17 LAB — BASIC METABOLIC PANEL
BUN: 14 mg/dL (ref 7–25)
CHLORIDE: 106 mmol/L (ref 98–110)
CO2: 25 mmol/L (ref 20–31)
CREATININE: 1.07 mg/dL — AB (ref 0.60–0.93)
Calcium: 9.2 mg/dL (ref 8.6–10.4)
GLUCOSE: 96 mg/dL (ref 65–99)
POTASSIUM: 3.9 mmol/L (ref 3.5–5.3)
Sodium: 141 mmol/L (ref 135–146)

## 2015-09-17 NOTE — Progress Notes (Signed)
Cardiology Office Note   Date:  09/17/2015   ID:  Joanna [name], DOB 1941/01/07, MRN [id number]  PCP:  Gaye AlkenBARNES,ELIZABETH STEWART, MD  Cardiologist: Cassell Clementhomas Raneisha Bress MD  Chief Complaint  Patient presents with  . Atrial Fibrillation      History of Present Illness: Joanna [name] is a 74 y.o. female who presents for follow-up office visit  This pleasant 74 year old African American woman is seen or a follow-up office visit.. She is a former patient of Dr. Peter SwazilandJordan. She did [facility]. She has a past history of chronic permanent atrial fibrillation. She is a long-term warfarin. She's had a prior history of a middle cerebral artery CVA in 2012. She retired from work after that stroke. 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 She has a past history of hypertension. She has had a previous history of left ventricular dysfunction.  She was seen several weeks ago because she needed her medicines refilled.  She was also having increased shortness of breath and peripheral edema.  Clinically she was found to be in CHF.  She was started on furosemide 40 mg daily.  She has lost 14 pounds since last visit through diuresis.  Her peripheral edema has resolved.  She had a updated echocardiogram on 09/02/15 showing an ejection fraction of 50%.  She is in atrial fibrillation.  There was mild to moderate mitral regurgitation and severe tricuspid regurgitation noted.  Past Medical History  Diagnosis Date  . Chronic atrial fibrillation (HCC)   . Ischemic bowel disease (HCC)     With GI bleeding  . Splenic infarct   . Left ventricular dysfunction     EF is now 50-55% as of 2012  . Stroke [facility](HCC) February 2012    Now back on coumadin  . Diverticulosis   . Hypertension   . Obesity   . Aphasia 2012  . Dysnomia 12/2010  . Chronic kidney disease   . Acute renal failure (HCC) 02/23/2007-03/22/2007    Now with  normalization back to baseline    Past Surgical History  Procedure Laterality Date  . Ileostomy    . Colectomy  12/2010    partial  . Omentectomy      partial with ileostomy     Current Outpatient Prescriptions  Medication Sig Dispense Refill  . diltiazem (CARTIA XT) 120 MG 24 hr capsule Take 1 capsule (120 mg total) by mouth daily. Appointment needed for future refills 90 capsule 3  . furosemide (LASIX) 40 MG tablet Take 1 tablet (40 mg total) by mouth daily. 90 tablet 3  . metoprolol (LOPRESSOR) 100 MG tablet Take 1 tablet (100 mg total) by mouth 2 (two) times daily. 60 tablet 11  . Multiple Vitamin (MULTI-VITAMIN PO) Take 1 tablet by mouth daily.     Marland Kitchen. warfarin (COUMADIN) 5 MG tablet Take 1 to 1.5 tablets by mouth daily as directed by coumadin clinic 120 tablet 2   No current facility-administered medications for this visit.    Allergies:   Review of patient's allergies indicates no known allergies.    Social History:  The patient  reports that she has never smoked. She does not have any smokeless tobacco history on file. She reports that she does not drink alcohol.   Family History:  [name] family history includes Heart disease in her mother; Hypertension in her sister.    ROS:  Please see the  history of present illness.   Otherwise, review of systems are positive for none.   All other systems are reviewed and negative.    PHYSICAL EXAM: VS:  BP 150/100 mmHg  Pulse 81  Ht 5' 3.5" (1.613 m)  Wt 197 lb 1.9 oz (89.413 kg)  BMI 34.37 kg/m2 , BMI Body mass index is 34.37 kg/(m^2). GEN: Well nourished, well developed, in no acute distress HEENT: normal Neck: no JVD, carotid bruits, or masses Cardiac: Irregularly irregular.;  There is a grade 1/6 apical holosystolic murmur.  No, rubs, or gallops,no edema  Respiratory:  clear to auscultation bilaterally, normal work of breathing GI: soft, nontender, nondistended, + BS MS: no deformity or atrophy Skin: warm and dry,  no rash Neuro:  Strength and sensation are intact Psych: euthymic mood, full affect   EKG:  EKG is ordered today. The ekg ordered today demonstrates atrial fibrillation with heart rate 81 bpm.  Nonspecific ST-T wave changes.   Recent Labs: 09/01/2015: BUN 11; Creat 0.94*; Hemoglobin 13.2; Platelets 187; Potassium 3.9; Sodium 140    Lipid Panel    Component Value Date/Time   CHOL  12/26/2010 0403    142        ATP III CLASSIFICATION:  <200     mg/dL   Desirable  161-096  mg/dL   Borderline High  >=045    mg/dL   High          TRIG 69 12/26/2010 0403   HDL 52 12/26/2010 0403   CHOLHDL 2.7 12/26/2010 0403   VLDL 14 12/26/2010 0403   LDLCALC  12/26/2010 0403    76        Total Cholesterol/HDL:CHD Risk Coronary Heart Disease Risk Table                     Men   Women  1/2 Average Risk   3.4   3.3  Average Risk       5.0   4.4  2 X Average Risk   9.6   7.1  3 X Average Risk  23.4   11.0        Use the calculated Patient Ratio above and the CHD Risk Table to determine the patient's CHD Risk.        ATP III CLASSIFICATION (LDL):  <100     mg/dL   Optimal  409-811  mg/dL   Near or Above                    Optimal  130-159  mg/dL   Borderline  914-782  mg/dL   High  >956     mg/dL   Very High      Wt Readings from Last 3 Encounters:  09/17/15 197 lb 1.9 oz (89.413 kg)  09/01/15 211 lb 1.9 oz (95.763 kg)  01/21/15 204 lb 3.2 oz (92.625 kg)         ASSESSMENT AND PLAN:  1. permanent atrial fibrillation 2. past history of CVA secondary to embolus to the left middle cerebral artery 3. hypertensive heart disease without heart failure. 4. prior left ventricular dysfunction but more recent echo showing ejection fraction of 50%. 5.  Chronic combined systolic and diastolic heart failure, improved on furosemide  Disposition: Continue on same medication. We are checking a basal metabolic panel today to be sure she does not need any potassium supplementation.  Recheck  in 6 months for follow-up office visit and basal metabolic panel.  Current medicines are reviewed at length with the patient today.  The patient does not have concerns regarding medicines.  The following changes have been made:  no change  Labs/ tests ordered today include:   Orders Placed This Encounter  Procedures  . Basic metabolic panel      Signed, Cassell Clement MD 09/17/2015 1:38 PM    [facility] [address] [place], [place], [place]  [address] Phone: [phone]; Fax: [phone]

## 2015-09-17 NOTE — Patient Instructions (Signed)
Medication Instructions:  Your physician recommends that you continue on your current medications as directed. Please refer to the Current Medication list given to you today.  Labwork: bmet Testing/Procedures: none  Follow-Up: Your physician recommends that you schedule a follow-up appointment in: 6 month ov/bmet with Dawayne PatriciaLori G NP or Bing NeighborsScott W PA   If you need a refill on your cardiac medications before your next appointment, please call your pharmacy.

## 2015-09-18 NOTE — Progress Notes (Signed)
Quick Note:  Please report to patient. The recent labs are stable. Continue same medication and careful diet. ______ 

## 2015-09-21 NOTE — Addendum Note (Signed)
Addended by: Reesa ChewJONES, Oakley Kossman G on: 09/21/2015 05:31 PM   Modules accepted: Orders

## 2015-10-26 ENCOUNTER — Inpatient Hospital Stay (HOSPITAL_COMMUNITY)
Admission: EM | Admit: 2015-10-26 | Discharge: 2015-10-29 | DRG: 065 | Disposition: A | Discharge status: Home / Self Care | Payer: Medicare Other | Attending: Internal Medicine | Admitting: Internal Medicine

## 2015-10-26 ENCOUNTER — Emergency Department (HOSPITAL_COMMUNITY): Payer: Medicare Other

## 2015-10-26 ENCOUNTER — Encounter (HOSPITAL_COMMUNITY): Payer: Self-pay | Admitting: Family Medicine

## 2015-10-26 DIAGNOSIS — I5022 Chronic systolic (congestive) heart failure: Secondary | ICD-10-CM | POA: Diagnosis present

## 2015-10-26 DIAGNOSIS — Z7901 Long term (current) use of anticoagulants: Secondary | ICD-10-CM

## 2015-10-26 DIAGNOSIS — I639 Cerebral infarction, unspecified: Secondary | ICD-10-CM | POA: Diagnosis present

## 2015-10-26 DIAGNOSIS — I6339 Cerebral infarction due to thrombosis of other cerebral artery: Secondary | ICD-10-CM

## 2015-10-26 DIAGNOSIS — G8191 Hemiplegia, unspecified affecting right dominant side: Secondary | ICD-10-CM | POA: Diagnosis present

## 2015-10-26 DIAGNOSIS — I272 Other secondary pulmonary hypertension: Secondary | ICD-10-CM | POA: Diagnosis present

## 2015-10-26 DIAGNOSIS — I6789 Other cerebrovascular disease: Secondary | ICD-10-CM | POA: Diagnosis not present

## 2015-10-26 DIAGNOSIS — W19XXXA Unspecified fall, initial encounter: Secondary | ICD-10-CM | POA: Diagnosis present

## 2015-10-26 DIAGNOSIS — R262 Difficulty in walking, not elsewhere classified: Secondary | ICD-10-CM | POA: Diagnosis present

## 2015-10-26 DIAGNOSIS — R29810 Facial weakness: Secondary | ICD-10-CM | POA: Diagnosis present

## 2015-10-26 DIAGNOSIS — E669 Obesity, unspecified: Secondary | ICD-10-CM | POA: Diagnosis present

## 2015-10-26 DIAGNOSIS — I519 Heart disease, unspecified: Secondary | ICD-10-CM | POA: Diagnosis present

## 2015-10-26 DIAGNOSIS — M6282 Rhabdomyolysis: Secondary | ICD-10-CM | POA: Diagnosis present

## 2015-10-26 DIAGNOSIS — Z6834 Body mass index (BMI) 34.0-34.9, adult: Secondary | ICD-10-CM | POA: Diagnosis not present

## 2015-10-26 DIAGNOSIS — I482 Chronic atrial fibrillation, unspecified: Secondary | ICD-10-CM | POA: Diagnosis present

## 2015-10-26 DIAGNOSIS — I63512 Cerebral infarction due to unspecified occlusion or stenosis of left middle cerebral artery: Principal | ICD-10-CM | POA: Diagnosis present

## 2015-10-26 DIAGNOSIS — I4891 Unspecified atrial fibrillation: Secondary | ICD-10-CM

## 2015-10-26 DIAGNOSIS — I10 Essential (primary) hypertension: Secondary | ICD-10-CM

## 2015-10-26 DIAGNOSIS — R4701 Aphasia: Secondary | ICD-10-CM | POA: Diagnosis present

## 2015-10-26 DIAGNOSIS — I130 Hypertensive heart and chronic kidney disease with heart failure and stage 1 through stage 4 chronic kidney disease, or unspecified chronic kidney disease: Secondary | ICD-10-CM | POA: Diagnosis present

## 2015-10-26 DIAGNOSIS — G459 Transient cerebral ischemic attack, unspecified: Secondary | ICD-10-CM | POA: Diagnosis not present

## 2015-10-26 DIAGNOSIS — N182 Chronic kidney disease, stage 2 (mild): Secondary | ICD-10-CM | POA: Diagnosis present

## 2015-10-26 LAB — COMPREHENSIVE METABOLIC PANEL
ALK PHOS: 150 U/L — AB (ref 38–126)
ALT: 16 U/L (ref 14–54)
ANION GAP: 12 (ref 5–15)
AST: 34 U/L (ref 15–41)
Albumin: 3.5 g/dL (ref 3.5–5.0)
BUN: 12 mg/dL (ref 6–20)
CALCIUM: 9.4 mg/dL (ref 8.9–10.3)
CO2: 22 mmol/L (ref 22–32)
Chloride: 105 mmol/L (ref 101–111)
Creatinine, Ser: 1.04 mg/dL — ABNORMAL HIGH (ref 0.44–1.00)
GFR calc non Af Amer: 51 mL/min — ABNORMAL LOW (ref >60)
GFR, EST AFRICAN AMERICAN: 59 mL/min — AB (ref >60)
Glucose, Bld: 107 mg/dL — ABNORMAL HIGH (ref 65–99)
POTASSIUM: 4 mmol/L (ref 3.5–5.1)
SODIUM: 139 mmol/L (ref 135–145)
Total Bilirubin: 1.8 mg/dL — ABNORMAL HIGH (ref 0.3–1.2)
Total Protein: 7.4 g/dL (ref 6.5–8.1)

## 2015-10-26 LAB — I-STAT TROPONIN, ED: Troponin i, poc: 0.03 ng/mL (ref 0.00–0.08)

## 2015-10-26 LAB — DIFFERENTIAL
BASOS PCT: 0 %
Basophils Absolute: 0 10*3/uL (ref 0.0–0.1)
EOS ABS: 0 10*3/uL (ref 0.0–0.7)
EOS PCT: 0 %
LYMPHS PCT: 19 %
Lymphs Abs: 1.2 10*3/uL (ref 0.7–4.0)
MONO ABS: 0.6 10*3/uL (ref 0.1–1.0)
Monocytes Relative: 9 %
Neutro Abs: 4.7 10*3/uL (ref 1.7–7.7)
Neutrophils Relative %: 72 %

## 2015-10-26 LAB — I-STAT CHEM 8, ED
BUN: 14 mg/dL (ref 6–20)
CALCIUM ION: 1.11 mmol/L — AB (ref 1.13–1.30)
Chloride: 103 mmol/L (ref 101–111)
Creatinine, Ser: 0.9 mg/dL (ref 0.44–1.00)
Glucose, Bld: 104 mg/dL — ABNORMAL HIGH (ref 65–99)
HEMATOCRIT: 48 % — AB (ref 36.0–46.0)
Hemoglobin: 16.3 g/dL — ABNORMAL HIGH (ref 12.0–15.0)
Potassium: 3.8 mmol/L (ref 3.5–5.1)
SODIUM: 142 mmol/L (ref 135–145)
TCO2: 25 mmol/L (ref 0–100)

## 2015-10-26 LAB — URINALYSIS, ROUTINE W REFLEX MICROSCOPIC
BILIRUBIN URINE: NEGATIVE
Glucose, UA: NEGATIVE mg/dL
Ketones, ur: 15 mg/dL — AB
LEUKOCYTES UA: NEGATIVE
NITRITE: NEGATIVE
PH: 6.5 (ref 5.0–8.0)
Protein, ur: NEGATIVE mg/dL
SPECIFIC GRAVITY, URINE: 1.013 (ref 1.005–1.030)

## 2015-10-26 LAB — RAPID URINE DRUG SCREEN, HOSP PERFORMED
AMPHETAMINES: NOT DETECTED
Barbiturates: NOT DETECTED
Benzodiazepines: NOT DETECTED
Cocaine: NOT DETECTED
OPIATES: NOT DETECTED
Tetrahydrocannabinol: NOT DETECTED

## 2015-10-26 LAB — ETHANOL

## 2015-10-26 LAB — CBC
HCT: 44 % (ref 36.0–46.0)
Hemoglobin: 14 g/dL (ref 12.0–15.0)
MCH: 31.6 pg (ref 26.0–34.0)
MCHC: 31.8 g/dL (ref 30.0–36.0)
MCV: 99.3 fL (ref 78.0–100.0)
PLATELETS: 165 10*3/uL (ref 150–400)
RBC: 4.43 MIL/uL (ref 3.87–5.11)
RDW: 14.1 % (ref 11.5–15.5)
WBC: 6.6 10*3/uL (ref 4.0–10.5)

## 2015-10-26 LAB — URINE MICROSCOPIC-ADD ON

## 2015-10-26 LAB — PROTIME-INR
INR: 2 — AB (ref 0.00–1.49)
PROTHROMBIN TIME: 22.5 s — AB (ref 11.6–15.2)

## 2015-10-26 LAB — APTT: aPTT: 34 seconds (ref 24–37)

## 2015-10-26 MED ORDER — ACETAMINOPHEN 325 MG PO TABS
650.0000 mg | ORAL_TABLET | Freq: Once | ORAL | Status: AC
  Administered 2015-10-26: 650 mg via ORAL
  Filled 2015-10-26: qty 2

## 2015-10-26 NOTE — ED Notes (Signed)
Pt presents from home via [facility] with c/o fall and weakness.  Pt states she lowered herself out of bed and was too weak to get up at 0600, she was found by her son at 1600 when he came home.  She denies any injuries or any complaints. EMS ambulated patient and she had an "abnormal gait" but was able to ambulate. Pt is alert and oriented x4 and in NAD. Son and daughter at bedside.

## 2015-10-26 NOTE — ED Notes (Signed)
Patient transported to CT 

## 2015-10-26 NOTE — ED Notes (Signed)
Pt back from CT

## 2015-10-26 NOTE — ED Notes (Signed)
Patient attempted to urinate but was unable.

## 2015-10-26 NOTE — ED Notes (Signed)
Spoke with Dr. Anitra LauthPlunkett. Pt wanting to take her normal medications and MD okay with it. Pt given Coumadin 5 mg and Metoprolol Tartrate 100 mg by her daughter from her personal medications.

## 2015-10-26 NOTE — Consult Note (Signed)
Admission H&P    Chief Complaint: New onset right-sided weakness.  HPI: Joanna [name] is an 74 y.o. female history of atrial fibrillation on Coumadin and hypertension, as well as previous strokes 2008 2012, presenting with new onset right-sided weakness. He was last known well at 6:00 AM today. She fell and was unable to get up from the floor family members noted right-sided weakness in addition to slight facial asymmetry. Speech remained unchanged. CT scan of her head showed no acute intracranial abnormality. INR was 2.0. NIH stroke score was 5.  LSN: 6:00 AM on 10/26/2015 tPA Given: No: Beyond time window for treatment consideration; therapeutic INR on Coumadin mRankin:  Past Medical History  Diagnosis Date  . Chronic atrial fibrillation ([place])   . Ischemic bowel disease ([place])     With GI bleeding  . Splenic infarct   . Left ventricular dysfunction     EF is now 50-55% as of 2012  . Stroke [facility]) February 2012    Now back on coumadin  . Diverticulosis   . Hypertension   . Obesity   . Aphasia 2012  . Dysnomia 12/2010  . Chronic kidney disease   . Acute renal failure ([place]) 02/23/2007-03/22/2007    Now with normalization back to baseline    Past Surgical History  Procedure Laterality Date  . Ileostomy    . Colectomy  12/2010    partial  . Omentectomy      partial with ileostomy    Family History  Problem Relation Age of Onset  . Heart disease Mother   . Hypertension Sister    Social History:  reports that she has never smoked. She does not have any smokeless tobacco history on file. She reports that she does not drink alcohol. Her drug history is not on file.  Allergies: No Known Allergies  Medications: Patient's preadmission medications were reviewed by me.  ROS: History obtained from the patient  General ROS: negative for - chills, fatigue, fever, night sweats, weight gain or weight loss Psychological ROS: negative for - behavioral disorder, hallucinations, memory  difficulties, mood swings or suicidal ideation Ophthalmic ROS: negative for - blurry vision, double vision, eye pain or loss of vision ENT ROS: negative for - epistaxis, nasal discharge, oral lesions, sore throat, tinnitus or vertigo Allergy and Immunology ROS: negative for - hives or itchy/watery eyes Hematological and Lymphatic ROS: negative for - bleeding problems, bruising or swollen lymph nodes Endocrine ROS: negative for - galactorrhea, hair pattern changes, polydipsia/polyuria or temperature intolerance Respiratory ROS: negative for - cough, hemoptysis, shortness of breath or wheezing Cardiovascular ROS: negative for - chest pain, dyspnea on exertion, edema or irregular heartbeat Gastrointestinal ROS: negative for - abdominal pain, diarrhea, hematemesis, nausea/vomiting or stool incontinence Genito-Urinary ROS: negative for - dysuria, hematuria, incontinence or urinary frequency/urgency Musculoskeletal ROS: negative for - joint swelling or muscular weakness Neurological ROS: as noted in HPI Dermatological ROS: negative for rash and skin lesion changes  Physical Examination: Blood pressure 147/88, pulse 80, temperature 98.8 F (37.1 C), temperature source Oral, resp. rate 14, SpO2 94 %.  HEENT-  Normocephalic, no lesions, without obvious abnormality.  Normal external eye and conjunctiva.  Normal TM's bilaterally.  Normal auditory canals and external ears. Normal external nose, mucus membranes and septum.  Normal pharynx. Neck supple with no masses, nodes, nodules or enlargement. Cardiovascular - regularly irregular rhythm and S1, S2 normal Lungs - chest clear, no wheezing, rales, normal symmetric air entry Abdomen - soft, non-tender; bowel sounds normal;  no masses,  no organomegaly Extremities - no joint deformities, effusion, or inflammation and no edema  Neurologic Examination: Mental Status: Alert, oriented, thought content appropriate.  Speech fluent without evidence of aphasia.  Able to follow commands without difficulty. Cranial Nerves: II-Visual fields were normal. III/IV/VI-Pupils were equal and reacted normally to light. Extraocular movements were full and conjugate.    V/VII-no facial numbness; minimal right lower facial weakness. VIII-normal. X-normal speech and symmetrical palatal movement. XI: trapezius strength/neck flexion strength normal bilaterally XII-midline tongue extension with normal strength. Motor: Moderate weakness of right upper extremity proximally and distally as well as moderate weakness proximally of right lower extremity. Muscle tone normal right was flaccid. Strength and muscle tone of left extremities were normal. Sensory: Normal throughout. Deep Tendon Reflexes: 1+ and symmetric. Plantars: Mute bilaterally Cerebellar: Moderately impaired coordination of right upper extremity, commensurate with the severity of weakness.  Results for orders placed or performed during the hospital encounter of 10/26/15 (from the past 48 hour(s))  Ethanol     Status: None   Collection Time: 10/26/15  6:25 PM  Result Value Ref Range   Alcohol, Ethyl (B) <5 <5 mg/dL    Comment:        LOWEST DETECTABLE LIMIT FOR SERUM ALCOHOL IS 5 mg/dL FOR MEDICAL PURPOSES ONLY   Protime-INR     Status: Abnormal   Collection Time: 10/26/15  6:25 PM  Result Value Ref Range   Prothrombin Time 22.5 (H) 11.6 - 15.2 seconds   INR 2.00 (H) 0.00 - 1.49  APTT     Status: None   Collection Time: 10/26/15  6:25 PM  Result Value Ref Range   aPTT 34 24 - 37 seconds  CBC     Status: None   Collection Time: 10/26/15  6:25 PM  Result Value Ref Range   WBC 6.6 4.0 - 10.5 K/uL   RBC 4.43 3.87 - 5.11 MIL/uL   Hemoglobin 14.0 12.0 - 15.0 g/dL   HCT 44.0 36.0 - 46.0 %   MCV 99.3 78.0 - 100.0 fL   MCH 31.6 26.0 - 34.0 pg   MCHC 31.8 30.0 - 36.0 g/dL   RDW 14.1 11.5 - 15.5 %   Platelets 165 150 - 400 K/uL  Differential     Status: None   Collection Time: 10/26/15  6:25 PM   Result Value Ref Range   Neutrophils Relative % 72 %   Neutro Abs 4.7 1.7 - 7.7 K/uL   Lymphocytes Relative 19 %   Lymphs Abs 1.2 0.7 - 4.0 K/uL   Monocytes Relative 9 %   Monocytes Absolute 0.6 0.1 - 1.0 K/uL   Eosinophils Relative 0 %   Eosinophils Absolute 0.0 0.0 - 0.7 K/uL   Basophils Relative 0 %   Basophils Absolute 0.0 0.0 - 0.1 K/uL  Comprehensive metabolic panel     Status: Abnormal   Collection Time: 10/26/15  6:25 PM  Result Value Ref Range   Sodium 139 135 - 145 mmol/L   Potassium 4.0 3.5 - 5.1 mmol/L   Chloride 105 101 - 111 mmol/L   CO2 22 22 - 32 mmol/L   Glucose, Bld 107 (H) 65 - 99 mg/dL   BUN 12 6 - 20 mg/dL   Creatinine, Ser 1.04 (H) 0.44 - 1.00 mg/dL   Calcium 9.4 8.9 - 10.3 mg/dL   Total Protein 7.4 6.5 - 8.1 g/dL   Albumin 3.5 3.5 - 5.0 g/dL   AST 34 15 - 41 U/L  ALT 16 14 - 54 U/L   Alkaline Phosphatase 150 (H) 38 - 126 U/L   Total Bilirubin 1.8 (H) 0.3 - 1.2 mg/dL   GFR calc non Af Amer 51 (L) >60 mL/min   GFR calc Af Amer 59 (L) >60 mL/min    Comment: (NOTE) The eGFR has been calculated using the CKD EPI equation. This calculation has not been validated in all clinical situations. eGFR's persistently <60 mL/min signify possible Chronic Kidney Disease.    Anion gap 12 5 - 15  I-stat troponin, ED (not at [facility], [facility])     Status: None   Collection Time: 10/26/15  6:33 PM  Result Value Ref Range   Troponin i, poc 0.03 0.00 - 0.08 ng/mL   Comment 3            Comment: Due to the release kinetics of cTnI, a negative result within the first hours of the onset of symptoms does not rule out myocardial infarction with certainty. If myocardial infarction is still suspected, repeat the test at appropriate intervals.   I-Stat Chem 8, ED  (not at [facility], [facility])     Status: Abnormal   Collection Time: 10/26/15  6:35 PM  Result Value Ref Range   Sodium 142 135 - 145 mmol/L   Potassium 3.8 3.5 - 5.1 mmol/L   Chloride 103 101 - 111 mmol/L   BUN 14 6 - 20  mg/dL   Creatinine, Ser 0.90 0.44 - 1.00 mg/dL   Glucose, Bld 104 (H) 65 - 99 mg/dL   Calcium, Ion 1.11 (L) 1.13 - 1.30 mmol/L   TCO2 25 0 - 100 mmol/L   Hemoglobin 16.3 (H) 12.0 - 15.0 g/dL   HCT 48.0 (H) 36.0 - 46.0 %  Urine rapid drug screen (hosp performed)not at [facility]     Status: None   Collection Time: 10/26/15  8:04 PM  Result Value Ref Range   Opiates NONE DETECTED NONE DETECTED   Cocaine NONE DETECTED NONE DETECTED   Benzodiazepines NONE DETECTED NONE DETECTED   Amphetamines NONE DETECTED NONE DETECTED   Tetrahydrocannabinol NONE DETECTED NONE DETECTED   Barbiturates NONE DETECTED NONE DETECTED    Comment:        DRUG SCREEN FOR MEDICAL PURPOSES ONLY.  IF CONFIRMATION IS NEEDED FOR ANY PURPOSE, NOTIFY LAB WITHIN 5 DAYS.        LOWEST DETECTABLE LIMITS FOR URINE DRUG SCREEN Drug Class       Cutoff (ng/mL) Amphetamine      1000 Barbiturate      200 Benzodiazepine   622 Tricyclics       633 Opiates          300 Cocaine          300 THC              50   Urinalysis, Routine w reflex microscopic (not at [facility])     Status: Abnormal   Collection Time: 10/26/15  8:04 PM  Result Value Ref Range   Color, Urine YELLOW YELLOW   APPearance CLEAR CLEAR   Specific Gravity, Urine 1.013 1.005 - 1.030   pH 6.5 5.0 - 8.0   Glucose, UA NEGATIVE NEGATIVE mg/dL   Hgb urine dipstick TRACE (A) NEGATIVE   Bilirubin Urine NEGATIVE NEGATIVE   Ketones, ur 15 (A) NEGATIVE mg/dL   Protein, ur NEGATIVE NEGATIVE mg/dL   Nitrite NEGATIVE NEGATIVE   Leukocytes, UA NEGATIVE NEGATIVE  Urine microscopic-add on     Status: Abnormal  Collection Time: 10/26/15  8:04 PM  Result Value Ref Range   Squamous Epithelial / LPF 0-5 (A) NONE SEEN   WBC, UA 0-5 0 - 5 WBC/hpf   RBC / HPF 0-5 0 - 5 RBC/hpf   Bacteria, UA FEW (A) NONE SEEN   Ct Head Wo Contrast  10/26/2015  CLINICAL DATA:  Fall and weakness.  Initial encounter. EXAM: CT HEAD WITHOUT CONTRAST TECHNIQUE: Contiguous axial images were  obtained from the base of the skull through the vertex without intravenous contrast. COMPARISON:  12/27/2010 FINDINGS: Stable mild atrophy and periventricular white matter small vessel ischemic changes. The brain demonstrates no evidence of hemorrhage, infarction, edema, mass effect, extra-axial fluid collection, hydrocephalus or mass lesion. The skull is unremarkable. IMPRESSION: No acute findings.  Stable mild atrophy and small vessel disease. Electronically Signed   By: Aletta Edouard M.D.   On: 10/26/2015 19:50    Assessment: 74 y.o. female multiple risk factors for stroke as well as previous strokes, presenting with probable current acute left subcortical MCA territory stroke.  Stroke Risk Factors - atrial fibrillation and hypertension  Plan: 1. HgbA1c, fasting lipid panel 2. MRI, MRA  of the brain without contrast 3. PT consult, OT consult, Speech consult 4. Echocardiogram 5. Carotid dopplers 6. Prophylactic therapy-Anticoagulation: Coumadin 7. Risk factor modification 8. Telemetry monitoring  C.R. Nicole Kindred, MD [facility] [phone]  10/26/2015, 9:34 PM

## 2015-10-26 NOTE — ED Provider Notes (Signed)
CSN: [id number]     Arrival date & time 10/26/15  1723 History   First MD Initiated Contact with Patient 10/26/15 1728     Chief Complaint  Patient presents with  . Fall     (Consider location/radiation/quality/duration/timing/severity/associated sxs/prior Treatment) HPI Comments: Patient is a [age] year old female with a history of atrial fibrillation on Coumadin, ischemic bowel, hypertension, chronic kidney disease, stroke who presents today after being found on the floor by her son. Patient states around 6 AM this morning she went to the bathroom and when she came back her legs got tangled in the sheets. She states she sat down on the floor but did not fall and she was unable to get up so she just sat on the floor and watch TV until her son found her at 4 PM. When the son tried to get her up she was having difficulty standing and ambulating. They state she was falling to the left and was very off balance. They state normally she ambulates without difficulty she does not require any assist device to help her walk. She still drives and lives alone without problems.  Patient states she was feeling fine yesterday when she went to bed. She has been having in her normal state of health and has not missed any of her medications. She was supposed to go to her doctor today for an INR check.  Patient is a 74 y.o. female presenting with fall. The history is provided by the patient.  Fall This is a new problem.    Past Medical History  Diagnosis Date  . Chronic atrial fibrillation ([facility])   . Ischemic bowel disease ([facility])     With GI bleeding  . Splenic infarct   . Left ventricular dysfunction     EF is now 50-55% as of 2012  . Stroke [facility]) February 2012    Now back on coumadin  . Diverticulosis   . Hypertension   . Obesity   . Aphasia 2012  . Dysnomia 12/2010  . Chronic kidney disease   . Acute renal failure ([facility]) 02/23/2007-03/22/2007    Now with normalization back to baseline   Past Surgical  History  Procedure Laterality Date  . Ileostomy    . Colectomy  12/2010    partial  . Omentectomy      partial with ileostomy   Family History  Problem Relation Age of Onset  . Heart disease Mother   . Hypertension Sister    Social History  Substance Use Topics  . Smoking status: Never Smoker   . Smokeless tobacco: None  . Alcohol Use: No   OB History    No data available     Review of Systems  All other systems reviewed and are negative.     Allergies  Review of patient's allergies indicates no known allergies.  Home Medications   Prior to Admission medications   Medication Sig Start Date End Date Taking? Authorizing Provider  diltiazem (CARTIA XT) 120 MG 24 hr capsule Take 1 capsule (120 mg total) by mouth daily. Appointment needed for future refills 09/01/15  Yes Rosalio MacadamiaLori C Gerhardt, NP  furosemide (LASIX) 40 MG tablet Take 1 tablet (40 mg total) by mouth daily. 09/02/15  Yes Peter M SwazilandJordan, MD  metoprolol (LOPRESSOR) 100 MG tablet Take 1 tablet (100 mg total) by mouth 2 (two) times daily. 09/01/15  Yes Rosalio MacadamiaLori C Gerhardt, NP  warfarin (COUMADIN) 5 MG tablet Take 1 to 1.5 tablets by mouth daily as directed  by coumadin clinic Patient taking differently: Take 5-7.5 mg by mouth See admin instructions. Take 1 tablet by mouth daily except on Fridays take 1 and a half (7.5Mg ) 09/01/15  Yes Rosalio Macadamia, NP   BP 151/87 mmHg  Pulse 82  Temp(Src) 98.3 F (36.8 C) (Oral)  Resp 18  SpO2 91% Physical Exam  Constitutional: She is oriented to person, place, and time. She appears well-developed and well-nourished. No distress.  HENT:  Head: Normocephalic and atraumatic.  Mouth/Throat: Oropharynx is clear and moist.  Eyes: Conjunctivae and EOM are normal. Pupils are equal, round, and reactive to light.  Neck: Normal range of motion. Neck supple.  Cardiovascular: Normal rate and intact distal pulses.  An irregularly irregular rhythm present.  No murmur heard. Pulmonary/Chest:  Effort normal and breath sounds normal. No respiratory distress. She has no wheezes. She has no rales.  Abdominal: Soft. She exhibits no distension. There is no tenderness. There is no rebound and no guarding.  Musculoskeletal: Normal range of motion. She exhibits no edema or tenderness.  Neurological: She is alert and oriented to person, place, and time. Coordination abnormal.  Inability to perform heel-to-shin testing on the left she continually straightens out her leg and listed up but cannot perform the test. With the right foot she is only able to go halfway. No notable strength deficits. Sensation is intact. Right outer upper visual field cut. Minimal hemi-neglect on the right. Extraocular movements intact.  Skin: Skin is warm and dry. No rash noted. No erythema.  Psychiatric: She has a normal mood and affect. Her behavior is normal.  Nursing note and vitals reviewed.   ED Course  Procedures (including critical care time) Labs Review Labs Reviewed  PROTIME-INR - Abnormal; Notable for the following:    Prothrombin Time 22.5 (*)    INR 2.00 (*)    All other components within normal limits  COMPREHENSIVE METABOLIC PANEL - Abnormal; Notable for the following:    Glucose, Bld 107 (*)    Creatinine, Ser 1.04 (*)    Alkaline Phosphatase 150 (*)    Total Bilirubin 1.8 (*)    GFR calc non Af Amer 51 (*)    GFR calc Af Amer 59 (*)    All other components within normal limits  URINALYSIS, ROUTINE W REFLEX MICROSCOPIC (NOT AT [facility]) - Abnormal; Notable for the following:    Hgb urine dipstick TRACE (*)    Ketones, ur 15 (*)    All other components within normal limits  URINE MICROSCOPIC-ADD ON - Abnormal; Notable for the following:    Squamous Epithelial / LPF 0-5 (*)    Bacteria, UA FEW (*)    All other components within normal limits  I-STAT CHEM 8, ED - Abnormal; Notable for the following:    Glucose, Bld 104 (*)    Calcium, Ion 1.11 (*)    Hemoglobin 16.3 (*)    HCT 48.0 (*)     All other components within normal limits  ETHANOL  APTT  CBC  DIFFERENTIAL  URINE RAPID DRUG SCREEN, HOSP PERFORMED  I-STAT TROPOININ, ED    Imaging Review Ct Head Wo Contrast  10/26/2015  CLINICAL DATA:  Fall and weakness.  Initial encounter. EXAM: CT HEAD WITHOUT CONTRAST TECHNIQUE: Contiguous axial images were obtained from the base of the skull through the vertex without intravenous contrast. COMPARISON:  12/27/2010 FINDINGS: Stable mild atrophy and periventricular white matter small vessel ischemic changes. The brain demonstrates no evidence of hemorrhage, infarction, edema, mass effect,  extra-axial fluid collection, hydrocephalus or mass lesion. The skull is unremarkable. IMPRESSION: No acute findings.  Stable mild atrophy and small vessel disease. Electronically Signed   By: Irish Lack M.D.   On: 10/26/2015 19:50   I have personally reviewed and evaluated these images and lab results as part of my medical decision-making.   EKG Interpretation   Date/Time:  Monday October 26 2015 17:45:31 EST Ventricular Rate:  87 PR Interval:    QRS Duration: 101 QT Interval:  366 QTC Calculation: 440 R Axis:   46 Text Interpretation:  Atrial fibrillation Borderline repolarization  abnormality , new T wave inversion Confirmed by Anitra Lauth  MD, Alphonzo Lemmings  [phone]) on 10/26/2015 6:05:41 PM      MDM   Final diagnoses:  Cerebral infarction due to unspecified mechanism    Patient is a 74 year old female with a history of atrial fibrillation, prior stroke on Coumadin therapy who is presenting today with concerns for stroke. She states this morning she had gone to the bathroom and she came back to the bedroom her feet got tangled in his sheet and she sat down on the floor. She denies loss of consciousness or head injury. She states she was unable to get up so she sat on the floor until percent found her 4 PM. They state she usually has no difficulty ambulating however when they tried to get  her up she had a difficult time standing and was falling to the left. Patient currently has no complaints and states she is fine.  She has no chest pain, shortness of breath or recent illness.  However on exam patient has a right upper visual field cut, mild right-sided hemicolectomy mild right-sided facial droop and difficulty performing heel-to-shin testing. No notable weakness in the right upper or lower extremities.  Stroke order set initiated.  9:33 PM Labs without acute findings except for mildly some therapeutic Coumadin level of 2. CT negative for acute stroke however given patient's exam discussed with Dr. Roseanne Reno and feel the patient needs admission stroke workup including an MRI.  Gwyneth Sprout, MD 10/26/15 2133

## 2015-10-27 ENCOUNTER — Inpatient Hospital Stay (HOSPITAL_COMMUNITY): Payer: Medicare Other

## 2015-10-27 DIAGNOSIS — I639 Cerebral infarction, unspecified: Secondary | ICD-10-CM | POA: Diagnosis present

## 2015-10-27 DIAGNOSIS — I6789 Other cerebrovascular disease: Secondary | ICD-10-CM

## 2015-10-27 DIAGNOSIS — I63512 Cerebral infarction due to unspecified occlusion or stenosis of left middle cerebral artery: Principal | ICD-10-CM

## 2015-10-27 DIAGNOSIS — I519 Heart disease, unspecified: Secondary | ICD-10-CM

## 2015-10-27 DIAGNOSIS — I6339 Cerebral infarction due to thrombosis of other cerebral artery: Secondary | ICD-10-CM

## 2015-10-27 LAB — CBC
HCT: 42.8 % (ref 36.0–46.0)
HEMOGLOBIN: 13.9 g/dL (ref 12.0–15.0)
MCH: 32 pg (ref 26.0–34.0)
MCHC: 32.5 g/dL (ref 30.0–36.0)
MCV: 98.6 fL (ref 78.0–100.0)
PLATELETS: 156 10*3/uL (ref 150–400)
RBC: 4.34 MIL/uL (ref 3.87–5.11)
RDW: 14.2 % (ref 11.5–15.5)
WBC: 5.2 10*3/uL (ref 4.0–10.5)

## 2015-10-27 LAB — COMPREHENSIVE METABOLIC PANEL
ALK PHOS: 133 U/L — AB (ref 38–126)
ALT: 14 U/L (ref 14–54)
ANION GAP: 10 (ref 5–15)
AST: 33 U/L (ref 15–41)
Albumin: 3 g/dL — ABNORMAL LOW (ref 3.5–5.0)
BILIRUBIN TOTAL: 1.8 mg/dL — AB (ref 0.3–1.2)
BUN: 9 mg/dL (ref 6–20)
CALCIUM: 9 mg/dL (ref 8.9–10.3)
CO2: 24 mmol/L (ref 22–32)
CREATININE: 0.8 mg/dL (ref 0.44–1.00)
Chloride: 102 mmol/L (ref 101–111)
Glucose, Bld: 92 mg/dL (ref 65–99)
Potassium: 3.7 mmol/L (ref 3.5–5.1)
SODIUM: 136 mmol/L (ref 135–145)
TOTAL PROTEIN: 7.3 g/dL (ref 6.5–8.1)

## 2015-10-27 LAB — PROTIME-INR
INR: 1.8 — AB (ref 0.00–1.49)
PROTHROMBIN TIME: 20.8 s — AB (ref 11.6–15.2)

## 2015-10-27 LAB — LIPID PANEL
CHOL/HDL RATIO: 3.4 ratio
CHOLESTEROL: 117 mg/dL (ref 0–200)
HDL: 34 mg/dL — AB (ref >40)
LDL Cholesterol: 59 mg/dL (ref 0–99)
TRIGLYCERIDES: 122 mg/dL (ref <150)
VLDL: 24 mg/dL (ref 0–40)

## 2015-10-27 LAB — CK: Total CK: 856 U/L — ABNORMAL HIGH (ref 38–234)

## 2015-10-27 MED ORDER — WARFARIN - PHARMACIST DOSING INPATIENT
Freq: Every day | Status: DC
  Administered 2015-10-27: 17:00:00

## 2015-10-27 MED ORDER — ACETAMINOPHEN 325 MG PO TABS
650.0000 mg | ORAL_TABLET | Freq: Four times a day (QID) | ORAL | Status: DC | PRN
  Administered 2015-10-29: 650 mg via ORAL
  Filled 2015-10-27 (×2): qty 2

## 2015-10-27 MED ORDER — WARFARIN SODIUM 6 MG PO TABS
6.0000 mg | ORAL_TABLET | Freq: Once | ORAL | Status: AC
  Administered 2015-10-27: 6 mg via ORAL
  Filled 2015-10-27: qty 1

## 2015-10-27 MED ORDER — DILTIAZEM HCL ER COATED BEADS 120 MG PO CP24
120.0000 mg | ORAL_CAPSULE | Freq: Every day | ORAL | Status: DC
  Administered 2015-10-27 – 2015-10-29 (×3): 120 mg via ORAL
  Filled 2015-10-27 (×3): qty 1

## 2015-10-27 MED ORDER — SODIUM CHLORIDE 0.9 % IV SOLN
INTRAVENOUS | Status: DC
  Administered 2015-10-27: 10 mL/h via INTRAVENOUS

## 2015-10-27 MED ORDER — STROKE: EARLY STAGES OF RECOVERY BOOK
Freq: Once | Status: AC
  Administered 2015-10-27: 1
  Filled 2015-10-27: qty 1

## 2015-10-27 MED ORDER — SENNOSIDES-DOCUSATE SODIUM 8.6-50 MG PO TABS: 1.0000 | ORAL_TABLET | Freq: Every evening | ORAL | Status: DC | PRN

## 2015-10-27 MED ORDER — METOPROLOL TARTRATE 50 MG PO TABS
100.0000 mg | ORAL_TABLET | Freq: Two times a day (BID) | ORAL | Status: DC
  Administered 2015-10-27 – 2015-10-29 (×5): 100 mg via ORAL
  Filled 2015-10-27 (×5): qty 2

## 2015-10-27 MED ORDER — WARFARIN SODIUM 5 MG PO TABS: 5.0000 mg | ORAL_TABLET | Freq: Once | ORAL | Status: DC

## 2015-10-27 NOTE — Discharge Instructions (Addendum)
Information on my medicine - Coumadin®   (Warfarin) ° °This medication education was reviewed with me or my healthcare representative as part of my discharge preparation.  The pharmacist that spoke with me during my hospital stay was:  Alyson N Leonard, RPH ° °Why was Coumadin prescribed for you? °Coumadin was prescribed for you because you have a blood clot or a medical condition that can cause an increased risk of forming blood clots. Blood clots can cause serious health problems by blocking the flow of blood to the heart, lung, or brain. Coumadin can prevent harmful blood clots from forming. °As a reminder your indication for Coumadin is:   Select from menu ° °What test will check on my response to Coumadin? °While on Coumadin (warfarin) you will need to have an INR test regularly to ensure that your dose is keeping you in the desired range. The INR (international normalized ratio) number is calculated from the result of the laboratory test called prothrombin time (PT). ° °If an INR APPOINTMENT HAS NOT ALREADY BEEN MADE FOR YOU please schedule an appointment to have this lab work done by your health care provider within 7 days. °Your INR goal is usually a number between:  2 to 3 or your provider may give you a more narrow range like 2-2.5.  Ask your health care provider during an office visit what your goal INR is. ° °What  do you need to  know  About  COUMADIN? °Take Coumadin (warfarin) exactly as prescribed by your healthcare provider about the same time each day.  DO NOT stop taking without talking to the doctor who prescribed the medication.  Stopping without other blood clot prevention medication to take the place of Coumadin may increase your risk of developing a new clot or stroke.  Get refills before you run out. ° °What do you do if you miss a dose? °If you miss a dose, take it as soon as you remember on the same day then continue your regularly scheduled regimen the next day.  Do not take two doses of  Coumadin at the same time. ° °Important Safety Information °A possible side effect of Coumadin (Warfarin) is an increased risk of bleeding. You should call your healthcare provider right away if you experience any of the following: °? Bleeding from an injury or your nose that does not stop. °? Unusual colored urine (red or dark brown) or unusual colored stools (red or black). °? Unusual bruising for unknown reasons. °? A serious fall or if you hit your head (even if there is no bleeding). ° °Some foods or medicines interact with Coumadin® (warfarin) and might alter your response to warfarin. To help avoid this: °? Eat a balanced diet, maintaining a consistent amount of Vitamin K. °? Notify your provider about major diet changes you plan to make. °? Avoid alcohol or limit your intake to 1 drink for women and 2 drinks for men per day. °(1 drink is 5 oz. wine, 12 oz. beer, or 1.5 oz. liquor.) ° °Make sure that ANY health care provider who prescribes medication for you knows that you are taking Coumadin (warfarin).  Also make sure the healthcare provider who is monitoring your Coumadin knows when you have started a new medication including herbals and non-prescription products. ° °Coumadin® (Warfarin)  Major Drug Interactions  °Increased Warfarin Effect Decreased Warfarin Effect  °Alcohol (large quantities) °Antibiotics (esp. Septra/Bactrim, Flagyl, Cipro) °Amiodarone (Cordarone) °Aspirin (ASA) °Cimetidine (Tagamet) °Megestrol (Megace) °NSAIDs (ibuprofen, naproxen, etc.) °Piroxicam (  Feldene) Propafenone (Rythmol SR) Propranolol (Inderal) Isoniazid (INH) Posaconazole (Noxafil) Barbiturates (Phenobarbital) Carbamazepine (Tegretol) Chlordiazepoxide (Librium) Cholestyramine (Questran) Griseofulvin Oral Contraceptives Rifampin Sucralfate (Carafate) Vitamin K   Coumadin (Warfarin) Major Herbal Interactions  Increased Warfarin Effect Decreased Warfarin Effect  Garlic Ginseng Ginkgo biloba Coenzyme Q10 Green  tea [place]. Johns wort    Coumadin (Warfarin) FOOD Interactions  Eat a consistent number of servings per week of foods HIGH in Vitamin K (1 serving =  cup)  Collards (cooked, or boiled & drained) Kale (cooked, or boiled & drained) Mustard greens (cooked, or boiled & drained) Parsley *serving size only =  cup Spinach (cooked, or boiled & drained) Swiss chard (cooked, or boiled & drained) Turnip greens (cooked, or boiled & drained)  Eat a consistent number of servings per week of foods MEDIUM-HIGH in Vitamin K (1 serving = 1 cup)  Asparagus (cooked, or boiled & drained) Broccoli (cooked, boiled & drained, or raw & chopped) Brussel sprouts (cooked, or boiled & drained) *serving size only =  cup Lettuce, raw (green leaf, endive, romaine) Spinach, raw Turnip greens, raw & chopped   These websites have more information on Coumadin (warfarin):  http://www.king-russell.com/; https://www.hines.net/;  ____________________________________________________________________________   Transient Ischemic Attack A transient ischemic attack (TIA) is a "warning stroke" that causes stroke-like symptoms. Unlike a stroke, a TIA does not cause permanent damage to the brain. The symptoms of a TIA can happen very fast and do not last long. It is important to know the symptoms of a TIA and what to do. This can help prevent a major stroke or death. CAUSES  A TIA is caused by a temporary blockage in an artery in the brain or neck (carotid artery). The blockage does not allow the brain to get the blood supply it needs and can cause different symptoms. The blockage can be caused by either:  A blood clot.  Fatty buildup (plaque) in a neck or brain artery. RISK FACTORS  High blood pressure (hypertension).  High cholesterol.  Diabetes mellitus.  Heart disease.  The buildup of plaque in the blood vessels (peripheral artery disease or atherosclerosis).  The buildup of plaque in the blood vessels that  provide blood and oxygen to the brain (carotid artery stenosis).  An abnormal heart rhythm (atrial fibrillation).  Obesity.  Using any tobacco products, including cigarettes, chewing tobacco, or electronic cigarettes.  Taking oral contraceptives, especially in combination with using tobacco.  Physical inactivity.  A diet high in fats, salt (sodium), and calories.  Excessive alcohol use.  Use of illegal drugs (especially cocaine and methamphetamine).  Being female.  Being African American.  Being over the age of 76 years.  Family history of stroke.  Previous history of blood clots, stroke, TIA, or heart attack.  Sickle cell disease. SIGNS AND SYMPTOMS  TIA symptoms are the same as a stroke but are temporary. These symptoms usually develop suddenly, or may be newly present upon waking from sleep:  Sudden weakness or numbness of the face, arm, or leg, especially on one side of the body.  Sudden trouble walking or difficulty moving arms or legs.  Sudden confusion.  Sudden personality changes.  Trouble speaking (aphasia) or understanding.  Difficulty swallowing.  Sudden trouble seeing in one or both eyes.  Double vision.  Dizziness.  Loss of balance or coordination.  Sudden severe headache with no known cause.  Trouble reading or writing.  Loss of bowel or bladder control.  Loss of consciousness. DIAGNOSIS  Your health care provider may be  able to determine the presence or absence of a TIA based on your symptoms, history, and physical exam. CT scan of the brain is usually performed to help identify a TIA. Other tests may include:  Electrocardiography (ECG).  Continuous heart monitoring.  Echocardiography.  Carotid ultrasonography.  MRI.  A scan of the brain circulation.  Blood tests. TREATMENT  Since the symptoms of TIA are the same as a stroke, it is important to seek treatment as soon as possible. You may need a medicine to dissolve a blood clot  (thrombolytic) if that is the cause of the TIA. This medicine cannot be given if too much time has passed. Treatment may also include:   Rest, oxygen, fluids through an IV tube, and medicines to thin the blood (anticoagulants).  Measures will be taken to prevent short-term and long-term complications, including infection from breathing foreign material into the lungs (aspiration pneumonia), blood clots in the legs, and falls.  Procedures to either remove plaque in the carotid arteries or dilate carotid arteries that have narrowed due to plaque. Those procedures are:  Carotid endarterectomy.  Carotid angioplasty and stenting.  Medicines and diet may be used to address diabetes, high blood pressure, and other underlying risk factors. HOME CARE INSTRUCTIONS   Take medicines only as directed by your health care provider. Follow the directions carefully. Medicines may be used to control risk factors for a stroke. Be sure you understand all your medicine instructions.  You may be told to take aspirin or the anticoagulant warfarin. Warfarin needs to be taken exactly as instructed.  Taking too much or too little warfarin is dangerous. Too much warfarin increases the risk of bleeding. Too little warfarin continues to allow the risk for blood clots. While taking warfarin, you will need to have regular blood tests to measure your blood clotting time. A PT blood test measures how long it takes for blood to clot. Your PT is used to calculate another value called an INR. Your PT and INR help your health care provider to adjust your dose of warfarin. The dose can change for many reasons. It is critically important that you take warfarin exactly as prescribed.  Many foods, especially foods high in vitamin K can interfere with warfarin and affect the PT and INR. Foods high in vitamin K include spinach, kale, broccoli, cabbage, collard and turnip greens, Brussels sprouts, peas, cauliflower, seaweed, and parsley,  as well as beef and pork liver, green tea, and soybean oil. You should eat a consistent amount of foods high in vitamin K. Avoid major changes in your diet, or notify your health care provider before changing your diet. Arrange a visit with a dietitian to answer your questions.  Many medicines can interfere with warfarin and affect the PT and INR. You must tell your health care provider about any and all medicines you take; this includes all vitamins and supplements. Be especially cautious with aspirin and anti-inflammatory medicines. Do not take or discontinue any prescribed or over-the-counter medicine except on the advice of your health care provider or pharmacist.  Warfarin can have side effects, such as excessive bruising or bleeding. You will need to hold pressure over cuts for longer than usual. Your health care provider or pharmacist will discuss other potential side effects.  Avoid sports or activities that may cause injury or bleeding.  Be careful when shaving, flossing your teeth, or handling sharp objects.  Alcohol can change the body's ability to handle warfarin. It is best to avoid alcoholic  drinks or consume only very small amounts while taking warfarin. Notify your health care provider if you change your alcohol intake.  Notify your dentist or other health care providers before procedures.  Eat a diet that includes 5 or more servings of fruits and vegetables each day. This may reduce the risk of stroke. Certain diets may be prescribed to address high blood pressure, high cholesterol, diabetes, or obesity.  A diet low in sodium, saturated fat, trans fat, and cholesterol is recommended to manage high blood pressure.  A diet low in saturated fat, trans fat, and cholesterol, and high in fiber may control cholesterol levels.  A controlled-carbohydrate, controlled-sugar diet is recommended to manage diabetes.  A reduced-calorie diet that is low in sodium, saturated fat, trans fat, and  cholesterol is recommended to manage obesity.  Maintain a healthy weight.  Stay physically active. It is recommended that you get at least 30 minutes of activity on most or all days.  Do not use any tobacco products, including cigarettes, chewing tobacco, or electronic cigarettes. If you need help quitting, ask your health care provider.  Limit alcohol intake to no more than 1 drink per day for nonpregnant women and 2 drinks per day for men. One drink equals 12 ounces of beer, 5 ounces of wine, or 1 ounces of hard liquor.  Do not abuse drugs.  A safe home environment is important to reduce the risk of falls. Your health care provider may arrange for specialists to evaluate your home. Having grab bars in the bedroom and bathroom is often important. Your health care provider may arrange for equipment to be used at home, such as raised toilets and a seat for the shower.  Follow all instructions for follow-up with your health care provider. This is very important. This includes any referrals and lab tests. Proper follow-up can prevent a stroke or another TIA from occurring. PREVENTION  The risk of a TIA can be decreased by appropriately treating high blood pressure, high cholesterol, diabetes, heart disease, and obesity, and by quitting smoking, limiting alcohol, and staying physically active. SEEK MEDICAL CARE IF:  You have personality changes.  You have difficulty swallowing.  You are seeing double.  You have dizziness.  You have a fever. SEEK IMMEDIATE MEDICAL CARE IF:  Any of the following symptoms may represent a serious problem that is an emergency. Do not wait to see if the symptoms will go away. Get medical help right away. Call your local emergency services (911 in [place] Do not drive yourself to the hospital.  You have sudden weakness or numbness of the face, arm, or leg, especially on one side of the body.  You have sudden trouble walking or difficulty moving arms or  legs.  You have sudden confusion.  You have trouble speaking (aphasia) or understanding.  You have sudden trouble seeing in one or both eyes.  You have a loss of balance or coordination.  You have a sudden, severe headache with no known cause.  You have new chest pain or an irregular heartbeat.  You have a partial or total loss of consciousness. MAKE SURE YOU:   Understand these instructions.  Will watch your condition.  Will get help right away if you are not doing well or get worse.   This information is not intended to replace advice given to you by your health care provider. Make sure you discuss any questions you have with your health care provider.   Document Released: 08/10/2005 Document Revised:  11/21/2014 Document Reviewed: 02/05/2014 Elsevier Interactive Patient Education [employer]2016 [employer] Inc.

## 2015-10-27 NOTE — Progress Notes (Signed)
BP 140/84 mmHg  Pulse 82  Temp(Src) 98.1 F (36.7 C) (Oral)  Resp 18  Ht 5\' 3"  (1.6 m)  Wt 87.59 kg (193 lb 1.6 oz)  BMI 34.21 kg/m2  SpO2 98%  General: Patient is awake alert Cardiovascular: Regular rate, without any regular rate positive S1-S2 Lungs: Good air movement clear to auscultation. Neurological exam awake alert and oriented 3 right upper extremity is 2 out of 5, low was about 4-5, left is intact.  I have reviewed the data and plan by admitting M.D. HgbA1c, fasting lipid panel pending MRI, MRA of the brain without contrast pending PT, OT, Speech consult pending Echocardiogram pending Carotid dopplers less than 40% ICA stenosis bilaterally. Prophylactic therapy-Antiplatelet med: Continue Coumadin. Coumadin per pharmacy. Avoid D5 fluids as may be harmfull risk factor modification  Cardiac Monitoring  Neurochecks q4h  Keep MAP 70 tobacco counseling cessation.

## 2015-10-27 NOTE — Progress Notes (Signed)
Occupational Therapy Evaluation Patient Details Name: Joanna [name] MRN: [id number] DOB: 1940-12-23 Today's Date: 10/27/2015    History of Present Illness 74 y.o. female admitted after falling when trying to get out of bed. Pt's son noticed pt was having difficulty standing, ambulating, and was weak on the R side. CT (-), MRI pending for CVA. Marland Kitchen PMH significant for HTN, CKD, CVA (2012), chronic atrial fibrillation, aphasia, dysnomia, and ischemic bowel disease.   Clinical Impression   PTA, pt was independent with all ADLs and functional mobility. Pt currently presents with generalized weakness, impaired balance, proprioceptive deficits in RUE, and peripheral visual field deficits. Pt was min assist for all ADLs and functional transfers due to LOB x3 to L side. Pt will benefit from acute skilled OT to increase safety and independence with ADLs and functional mobility to allow safe discharge home. Recommending 3in1, shower seat, and RW for pt safety with ADLs since she lives alone and has decreased caregiver support. Recommending HHOT for ADL retraining and fall risk evaluation.    Follow Up Recommendations  Home health OT;Supervision/Assistance - 24 hour    Equipment Recommendations  3 in 1 bedside comode; Other (RW-2 wheeled)    Recommendations for Other Services       Precautions / Restrictions Precautions Precautions: Fall Restrictions Weight Bearing Restrictions: No      Mobility Bed Mobility Overal bed mobility: Needs Assistance Bed Mobility: Supine to Sit;Sit to Supine     Supine to sit: Mod assist Sit to supine: Min assist   General bed mobility comments: HOB flat, use of bedrails. 1 hand held assist and assist to elevate trunk to come to sitting. Min assist for safe descent back to bed.  Transfers Overall transfer level: Needs assistance Equipment used: Rolling walker (2 wheeled) Transfers: Sit to/from Stand Sit to Stand: Mod assist         General  transfer comment: Mod assist for first sit-stand transfer - min assist for all others (x2). Used RW initially for safety but pt demonstrating difficulty using RW in small space (bathroom). When ambulating without RW, pt had LOB x3 to L side.     Balance Overall balance assessment: Needs assistance Sitting-balance support: No upper extremity supported;Feet supported Sitting balance-Leahy Scale: Poor Sitting balance - Comments: Postural instability at EOB Postural control: Posterior lean;Right lateral lean Standing balance support: No upper extremity supported;During functional activity Standing balance-Leahy Scale: Poor Standing balance comment: LOB x1 with no UE support when completing pericare; LOB x2 during dynamic standing tasks                            ADL Overall ADL's : Needs assistance/impaired Eating/Feeding: Set up;Sitting   Grooming: Wash/dry hands;Minimal assistance;Standing               Lower Body Dressing: Minimal assistance;Sit to/from stand   Toilet Transfer: Minimal assistance;Ambulation;BSC;RW   Toileting- Clothing Manipulation and Hygiene: Minimal assistance;Sit to/from stand       Functional mobility during ADLs: Minimal assistance;Rolling walker General ADL Comments: Pt demonstrating some proprioceptive deficits during session. Pt with postural instability while seated EOB with posterior and lateral leaning noted. Difficulty grading movement and would press OT's finger and pt's nose very firmly during finger-to-nose testing. Min assist for all mobility during session to maintain balance - pt with  LOB x3 to L side. Verbal cues for safe hand placement and use of RW. Min assist to reach feet for LB  ADLs.     Vision Vision Assessment?: Yes Eye Alignment: Within Functional Limits Alignment/Gaze Preference: Within Defined Limits Convergence: Within functional limits Visual Fields: Other (comment) (decreased bilateral peripheral visual  field) Additional Comments: Diminished peripheral visual field 0-60 degrees bilaterally, diminished superior visual field 0-30 degrees   Perception     Praxis      Pertinent Vitals/Pain Pain Assessment: No/denies pain     Hand Dominance Right   Extremity/Trunk Assessment Upper Extremity Assessment Upper Extremity Assessment: RUE deficits/detail;Generalized weakness RUE Deficits / Details: Decreased sensation in fingers - pt unable toa accurately locate light touch, generalized weakness 3/5 all muscle groups RUE Sensation: decreased proprioception;decreased light touch RUE Coordination: decreased fine motor   Lower Extremity Assessment Lower Extremity Assessment: Defer to PT evaluation   Cervical / Trunk Assessment Cervical / Trunk Assessment: Kyphotic   Communication Communication Communication: Expressive difficulties (Very mild, difficulty getting words out occassionally)   Cognition Arousal/Alertness: Awake/alert Behavior During Therapy: WFL for tasks assessed/performed Overall Cognitive Status: Impaired/Different from baseline Area of Impairment: Awareness;Safety/judgement;Following commands;Problem solving       Following Commands: Follows one step commands inconsistently;Follows multi-step commands inconsistently Safety/Judgement: Decreased awareness of safety;Decreased awareness of deficits Awareness: Emergent Problem Solving: Slow processing;Decreased initiation;Requires verbal cues     General Comments       Exercises       Shoulder Instructions      Home Living Family/patient expects to be discharged to:: Private residence Living Arrangements: Alone Available Help at Discharge: Family;Available PRN/intermittently Type of Home: House Home Access: Stairs to enter Entergy CorporationEntrance Stairs-Number of Steps: 4 Entrance Stairs-Rails: Right;Left;Can reach both Home Layout: One level     Bathroom Shower/Tub: Tub/shower unit;Door Shower/tub characteristics:  Sport and exercise psychologistDoor Bathroom Toilet: Standard     Home Equipment: None   Additional Comments: Pt's daughter and son live nearby and visit regularly      Prior Functioning/Environment Level of Independence: Independent        Comments: Independent with all ADLs and IADLs, drives    OT Diagnosis: Generalized weakness;Disturbance of vision   OT Problem List: Decreased strength;Decreased range of motion;Decreased activity tolerance;Impaired balance (sitting and/or standing);Impaired vision/perception;Decreased coordination;Decreased cognition;Decreased safety awareness;Decreased knowledge of use of DME or AE;Obesity   OT Treatment/Interventions: Self-care/ADL training;Therapeutic exercise;Energy conservation;DME and/or AE instruction;Therapeutic activities;Visual/perceptual remediation/compensation;Cognitive remediation/compensation;Patient/family education;Balance training    OT Goals(Current goals can be found in the care plan section) Acute Rehab OT Goals Patient Stated Goal: none stated OT Goal Formulation: With patient Time For Goal Achievement: 11/10/15 Potential to Achieve Goals: Good ADL Goals Pt Will Perform Grooming: with modified independence;standing Pt Will Perform Upper Body Bathing: with modified independence;sitting;standing Pt Will Perform Lower Body Bathing: with modified independence;sitting/lateral leans;sit to/from stand Pt Will Perform Upper Body Dressing: with modified independence;sitting;standing Pt Will Perform Lower Body Dressing: with modified independence;sitting/lateral leans;sit to/from stand Pt Will Transfer to Toilet: with supervision;bedside commode;ambulating Pt Will Perform Toileting - Clothing Manipulation and hygiene: with supervision;sitting/lateral leans;sit to/from stand Pt Will Perform Tub/Shower Transfer: Tub transfer;with supervision;ambulating;shower seat;rolling walker  OT Frequency: Min 3X/week   Barriers to D/C: Decreased caregiver support  Pt  lives alone and son/daughter work during the day       Co-evaluation              End of Session Equipment Utilized During Treatment: Careers adviserGait belt;Rolling walker Nurse Communication: Mobility status;Other (comment) (Visual deficits)  Activity Tolerance: Patient tolerated treatment well Patient left: in bed;with call bell/phone within reach;Other (comment) (transport present to take pt to  MRI)   Time: 1000-1033 OT Time Calculation (min): 33 min Charges:  OT General Charges $OT Visit: 1 Procedure OT Evaluation $Initial OT Evaluation Tier I: 1 Procedure OT Treatments $Self Care/Home Management : 8-22 mins G-Codes:    Nils Pyle, OTR/L 11/25/2015, 11:28 AM

## 2015-10-27 NOTE — Progress Notes (Signed)
ANTICOAGULATION CONSULT NOTE - Follow-up Consult  Pharmacy Consult for Coumadin Indication: atrial fibrillation  No Known Allergies  Patient Measurements: Height: 5\' 3"  (160 cm) Weight: 193 lb 1.6 oz (87.59 kg) IBW/kg (Calculated) : 52.4  Vital Signs: Temp: 98.2 F (36.8 C) (12/13 0900) Temp Source: Oral (12/13 0900) BP: 137/85 mmHg (12/13 0900) Pulse Rate: 101 (12/13 0900)  Labs:  Recent Labs  10/26/15 1825 10/26/15 1835 10/27/15 0745  HGB 14.0 16.3* 13.9  HCT 44.0 48.0* 42.8  PLT 165  --  156  APTT 34  --   --   LABPROT 22.5*  --  20.8*  INR 2.00*  --  1.80*  CREATININE 1.04* 0.90 0.80  CKTOTAL  --   --  856*    Estimated Creatinine Clearance: 63.8 mL/min (by C-G formula based on Cr of 0.8).   Medical History: Past Medical History  Diagnosis Date  . Chronic atrial fibrillation (HCC)   . Ischemic bowel disease (HCC)     With GI bleeding  . Splenic infarct   . Left ventricular dysfunction     EF is now 50-55% as of 2012  . Stroke [facility](HCC) February 2012    Now back on coumadin  . Diverticulosis   . Hypertension   . Obesity   . Aphasia 2012  . Dysnomia 12/2010  . Chronic kidney disease   . Acute renal failure (HCC) 02/23/2007-03/22/2007    Now with normalization back to baseline    Medications:  Prescriptions prior to admission  Medication Sig Dispense Refill Last Dose  . diltiazem (CARTIA XT) 120 MG 24 hr capsule Take 1 capsule (120 mg total) by mouth daily. Appointment needed for future refills 90 capsule 3 10/26/2015 at Unknown time  . furosemide (LASIX) 40 MG tablet Take 1 tablet (40 mg total) by mouth daily. 90 tablet 3 10/26/2015 at Unknown time  . metoprolol (LOPRESSOR) 100 MG tablet Take 1 tablet (100 mg total) by mouth 2 (two) times daily. 60 tablet 11 10/26/2015 at 1100  . warfarin (COUMADIN) 5 MG tablet Take 1 to 1.5 tablets by mouth daily as directed by coumadin clinic (Patient taking differently: Take 5-7.5 mg by mouth See admin instructions. Take  1 tablet by mouth daily except on Fridays take 1 and a half (7.5Mg )) 120 tablet 2 10/25/2015 at Unknown time    Assessment: 74 y.o. female admitted with CVA, h/o Afib, CVA, HTN, and Systolic HF Pharmacy consulted to continue Coumadin. INR at admission 2.0. Per nursing note on 12/12 pt given Coumadin 5 mg by her daughter from her personal medications. Current INR 1.8 subtherapeutic, Hgb/plt wnl  Warfarin PTA 5 mg daily, except 7.5 mg on Fridays  Goal of Therapy:  INR 2-3 Monitor platelets by anticoagulation protocol: Yes   Plan:  Coumadin 6 mg tonight Monitor for s/sx of bleeding, CBC, and daily INR  Remi HaggardAlyson N. Vianka Ertel, PharmD Clinical Pharmacist- Resident Pager: [phone]  Remi HaggardAlyson N Meyer Dockery 10/27/2015,10:15 AM

## 2015-10-27 NOTE — Progress Notes (Signed)
VASCULAR LAB PRELIMINARY  PRELIMINARY  PRELIMINARY  PRELIMINARY  Carotid duplex  completed.    Preliminary report:  Bilateral:  1-39% ICA stenosis.  Vertebral artery flow is antegrade.      Sereniti Wan, RVT 10/27/2015, 8:34 AM

## 2015-10-27 NOTE — Progress Notes (Signed)
Pt arrived on unit 2245 hrs, A&O, no obvious distress, on call MD paged/notified, current active orders continued, Pt oriented to room and equipment call bell within reach bed alarm on.

## 2015-10-27 NOTE — Progress Notes (Signed)
ANTICOAGULATION CONSULT NOTE - Initial Consult  Pharmacy Consult for Coumadin Indication: atrial fibrillation  No Known Allergies  Patient Measurements:    Vital Signs: Temp: 98.4 F (36.9 C) (12/12 2300) Temp Source: Oral (12/12 2300) BP: 125/80 mmHg (12/12 2300) Pulse Rate: 75 (12/12 2300)  Labs:  Recent Labs  10/26/15 1825 10/26/15 1835  HGB 14.0 16.3*  HCT 44.0 48.0*  PLT 165  --   APTT 34  --   LABPROT 22.5*  --   INR 2.00*  --   CREATININE 1.04* 0.90    CrCl cannot be calculated (Unknown ideal weight.).   Medical History: Past Medical History  Diagnosis Date  . Chronic atrial fibrillation (HCC)   . Ischemic bowel disease (HCC)     With GI bleeding  . Splenic infarct   . Left ventricular dysfunction     EF is now 50-55% as of 2012  . Stroke [facility](HCC) February 2012    Now back on coumadin  . Diverticulosis   . Hypertension   . Obesity   . Aphasia 2012  . Dysnomia 12/2010  . Chronic kidney disease   . Acute renal failure (HCC) 02/23/2007-03/22/2007    Now with normalization back to baseline    Medications:  Prescriptions prior to admission  Medication Sig Dispense Refill Last Dose  . diltiazem (CARTIA XT) 120 MG 24 hr capsule Take 1 capsule (120 mg total) by mouth daily. Appointment needed for future refills 90 capsule 3 10/26/2015 at Unknown time  . furosemide (LASIX) 40 MG tablet Take 1 tablet (40 mg total) by mouth daily. 90 tablet 3 10/26/2015 at Unknown time  . metoprolol (LOPRESSOR) 100 MG tablet Take 1 tablet (100 mg total) by mouth 2 (two) times daily. 60 tablet 11 10/26/2015 at 1100  . warfarin (COUMADIN) 5 MG tablet Take 1 to 1.5 tablets by mouth daily as directed by coumadin clinic (Patient taking differently: Take 5-7.5 mg by mouth See admin instructions. Take 1 tablet by mouth daily except on Fridays take 1 and a half (7.5Mg )) 120 tablet 2 10/25/2015 at Unknown time    Assessment: 74 y.o. female admitted with CVA, h/o Afib, to continue  Coumadin.  Last Coumadin dose taken at home 12/11 Goal of Therapy:  INR 2-3 Monitor platelets by anticoagulation protocol: Yes   Plan:  Coumadin 5 mg tonight Daily INR  Joanna [name], Joanna [name] 10/27/2015,12:27 AM

## 2015-10-27 NOTE — Progress Notes (Signed)
  Echocardiogram 2D Echocardiogram has been performed.  [name], [name] 10/27/2015, 8:56 AM

## 2015-10-27 NOTE — H&P (Signed)
[facility] History and Physical  Joanna Riffleatricia A Petro [id number] DOB: 1941/04/01 DOA: 10/26/2015  Referring physician: Dr. Anitra LauthPlunkett. PCP: Gaye AlkenBARNES,ELIZABETH STEWART, MD  Specialists: Dr. Patty SermonsBrackbill. Cardiologist.  Chief Complaint: Right-sided weakness.  HPI: Joanna [name] is a 74 y.o. female with history of chronic atrial fibrillation, history of CVA, hypertension, chronic systolic heart failure was brought to the ER after patient was found to have difficulty walking and right-sided weakness. As per the patient patient had tried to go to the bathroom yesterday morning when she fell after being tangled in the bed sheet. Since then patient was unable to walk. Later that evening when patients son visited patient patient was found to be on the floor and trying to make patient while patient was found to be unsteady and weak on the right side. Patient was brought to the ER. CT head did not show anything acute. On-call neurologist was consulted. On exam patient has weakness in the right upper extremity. Patient has been admitted for further stroke workup. Patient also has mild weakness in the right lower extremity. Denies any difficulty swallowing speaking or any visual symptoms.   Review of Systems: As presented in the history of presenting illness, rest negative.  Past Medical History  Diagnosis Date  . Chronic atrial fibrillation (HCC)   . Ischemic bowel disease (HCC)     With GI bleeding  . Splenic infarct   . Left ventricular dysfunction     EF is now 50-55% as of 2012  . Stroke [facility](HCC) February 2012    Now back on coumadin  . Diverticulosis   . Hypertension   . Obesity   . Aphasia 2012  . Dysnomia 12/2010  . Chronic kidney disease   . Acute renal failure (HCC) 02/23/2007-03/22/2007    Now with normalization back to baseline   Past Surgical History  Procedure Laterality Date  . Ileostomy    . Colectomy  12/2010    partial  . Omentectomy      partial with ileostomy   Social  History:  reports that she has never smoked. She does not have any smokeless tobacco history on file. She reports that she does not drink alcohol. Her drug history is not on file. Where does patient live home. Can patient participate in ADLs? Yes.  No Known Allergies  Family History:  Family History  Problem Relation Age of Onset  . Heart disease Mother   . Hypertension Sister       Prior to Admission medications   Medication Sig Start Date End Date Taking? Authorizing Provider  diltiazem (CARTIA XT) 120 MG 24 hr capsule Take 1 capsule (120 mg total) by mouth daily. Appointment needed for future refills 09/01/15  Yes Rosalio MacadamiaLori C Gerhardt, NP  furosemide (LASIX) 40 MG tablet Take 1 tablet (40 mg total) by mouth daily. 09/02/15  Yes Peter M SwazilandJordan, MD  metoprolol (LOPRESSOR) 100 MG tablet Take 1 tablet (100 mg total) by mouth 2 (two) times daily. 09/01/15  Yes Rosalio MacadamiaLori C Gerhardt, NP  warfarin (COUMADIN) 5 MG tablet Take 1 to 1.5 tablets by mouth daily as directed by coumadin clinic Patient taking differently: Take 5-7.5 mg by mouth See admin instructions. Take 1 tablet by mouth daily except on Fridays take 1 and a half (7.5Mg ) 09/01/15  Yes Rosalio MacadamiaLori C Gerhardt, NP    Physical Exam: Filed Vitals:   10/26/15 2130 10/26/15 2145 10/26/15 2200 10/26/15 2215  BP: 159/113 152/75 151/91 126/84  Pulse: 86 84 84 80  Temp:  TempSrc:      Resp: 33 SpO2:  95% 94% 92%     General:  Moderately built and nourished.  Eyes: Anicteric no pallor.  ENT: No discharge from the ears eyes nose and mouth.  Neck: No mass felt. No neck rigidity.  Cardiovascular: S1-S2 heard.  Respiratory: No rhonchi or crepitations.  Abdomen: Soft nontender bowel sounds present.  Skin: No rash.  Musculoskeletal: No edema.  Psychiatric: Appears normal.  Neurologic: Alert awake oriented to time place and person. Patient's right upper extremity is 2 x 5 in strength. Right lower extremity is 4 x 5 in strength.  Left upper and lower extremity is 5 x 5 in strength. No facial asymmetry tongue is midline.  Labs on Admission:  Basic Metabolic Panel:  Recent Labs Lab 10/26/15 1825 10/26/15 1835  NA 139 142  K 4.0 3.8  CL 105 103  CO2 22  --   GLUCOSE 107* 104*  BUN 12 14  CREATININE 1.04* 0.90  CALCIUM 9.4  --    Liver Function Tests:  Recent Labs Lab 10/26/15 1825  AST 34  ALT 16  ALKPHOS 150*  BILITOT 1.8*  PROT 7.4  ALBUMIN 3.5   No results for input(s): LIPASE, AMYLASE in the last 168 hours. No results for input(s): AMMONIA in the last 168 hours. CBC:  Recent Labs Lab 10/26/15 1825 10/26/15 1835  WBC 6.6  --   NEUTROABS 4.7  --   HGB 14.0 16.3*  HCT 44.0 48.0*  MCV 99.3  --   PLT 165  --    Cardiac Enzymes: No results for input(s): CKTOTAL, CKMB, CKMBINDEX, TROPONINI in the last 168 hours.  BNP (last 3 results) No results for input(s): BNP in the last 8760 hours.  ProBNP (last 3 results) No results for input(s): PROBNP in the last 8760 hours.  CBG: No results for input(s): GLUCAP in the last 168 hours.  Radiological Exams on Admission: Ct Head Wo Contrast  10/26/2015  CLINICAL DATA:  Fall and weakness.  Initial encounter. EXAM: CT HEAD WITHOUT CONTRAST TECHNIQUE: Contiguous axial images were obtained from the base of the skull through the vertex without intravenous contrast. COMPARISON:  12/27/2010 FINDINGS: Stable mild atrophy and periventricular white matter small vessel ischemic changes. The brain demonstrates no evidence of hemorrhage, infarction, edema, mass effect, extra-axial fluid collection, hydrocephalus or mass lesion. The skull is unremarkable. IMPRESSION: No acute findings.  Stable mild atrophy and small vessel disease. Electronically Signed   By: Irish Lack M.D.   On: 10/26/2015 19:50    EKG: Independently reviewed. Atrial fibrillation rate controlled.  Assessment/Plan Principal Problem:   Acute ischemic left MCA stroke [facility]) Active  Problems:   Left ventricular dysfunction   Chronic atrial fibrillation (HCC)   Stroke (cerebrum) (HCC)   1. Stroke with right-sided weakness - appreciate neurology consult. Patient is on Coumadin for history of A. fib which will be continued per pharmacy. Patient is placed on neurochecks. Swallow evaluation. Check MRI/MRA brain 2-D echo and carotid Doppler. 8 physical therapy consult. Check hemoglobin A1c and lipid panel. 2. Chronic systolic heart failure last EF measured in October 2016 was 50% - patient is on Lasix which has been held due to acute stroke. Closely observe respiratory status and restart Lasix once patient is stable. 3. Chronic atrial fibrillation - chads 2 vasc score of 7 (age, sex, CHF, stroke, hypertension). Patient is on Coumadin. Rate is controlled on patient limiting medications. 4. Hypertension - continue present medication.  Allow for permissive hypertension due to stroke.  I have reviewed patient's old charts of labs. X-ray of the right shoulder and pelvis is pending.   DVT Prophylaxis Coumadin.  Code Status: Full code.  Family Communication: Discussed with patient.  Disposition Plan: Admit to inpatient.    Fritz Cauthon N. [facility] Pager [phone].  If 7PM-7AM, please contact night-coverage [email or web address] Password [email or web address] 10/27/2015, 12:03 AM

## 2015-10-27 NOTE — Progress Notes (Signed)
STROKE TEAM PROGRESS NOTE   HISTORY Joanna [name] is an 74 y.o. female history of atrial fibrillation on Coumadin and hypertension, as well as previous strokes 2008 2012, presenting with new onset right-sided weakness. He was last known well at 6:00 AM today 10/26/2015. She fell and was unable to get up from the floor family members noted right-sided weakness in addition to slight facial asymmetry. Speech remained unchanged. CT scan of her head showed no acute intracranial abnormality. INR was 2.0. NIH stroke score was 5. Patient was not administered TPA secondary to being beyond time window for treatment consideration; therapeutic INR on Coumadin. She was admitted for further evaluation and treatment.   SUBJECTIVE (INTERVAL HISTORY) Her RN is at the bedside, no family present.  Overall she feels her condition is stable. She is frustrated because she wants to eat and people keep interrupting her.   OBJECTIVE Temp:  [97.9 F (36.6 C)-98.8 F (37.1 C)] 97.9 F (36.6 C) (12/13 1240) Pulse Rate:  [75-101] 100 (12/13 1240) Cardiac Rhythm:  [-] Heart block (12/13 0700) Resp:  [14-33] 18 (12/13 0600) BP: (125-172)/(75-113) 157/87 mmHg (12/13 1240) SpO2:  [88 %-98 %] 98 % (12/13 0600) Weight:  [87.59 kg (193 lb 1.6 oz)] 87.59 kg (193 lb 1.6 oz) (12/12 2300)  CBC:   Recent Labs Lab 10/26/15 1825 10/26/15 1835 10/27/15 0745  WBC 6.6  --  5.2  NEUTROABS 4.7  --   --   HGB 14.0 16.3* 13.9  HCT 44.0 48.0* 42.8  MCV 99.3  --  98.6  PLT 165  --  156    Basic Metabolic Panel:   Recent Labs Lab 10/26/15 1825 10/26/15 1835 10/27/15 0745  NA 139 142 136  K 4.0 3.8 3.7  CL 105 103 102  CO2 22  --  24  GLUCOSE 107* 104* 92  BUN CREATININE 1.04* 0.90 0.80  CALCIUM 9.4  --  9.0    Lipid Panel:     Component Value Date/Time   CHOL 117 10/27/2015 0745   TRIG 122 10/27/2015 0745   HDL 34* 10/27/2015 0745   CHOLHDL 3.4 10/27/2015 0745   VLDL 24 10/27/2015 0745   LDLCALC  59 10/27/2015 0745   HgbA1c:  Lab Results  Component Value Date   HGBA1C * 12/26/2010    6.0 (NOTE)                                                                       According to the ADA Clinical Practice Recommendations for 2011, when HbA1c is used as a screening test:   >=6.5%   Diagnostic of Diabetes Mellitus           (if abnormal result  is confirmed)  5.7-6.4%   Increased risk of developing Diabetes Mellitus  References:Diagnosis and Classification of Diabetes Mellitus,Diabetes Care,2011,34(Suppl 1):S62-S69 and Standards of Medical Care in         Diabetes - 2011,Diabetes Care,2011,34  (Suppl 1):S11-S61.   Urine Drug Screen:     Component Value Date/Time   LABOPIA NONE DETECTED 10/26/2015 2004   COCAINSCRNUR NONE DETECTED 10/26/2015 2004   LABBENZ NONE DETECTED 10/26/2015 2004   AMPHETMU NONE DETECTED 10/26/2015 2004   THCU NONE DETECTED  10/26/2015 2004   LABBARB NONE DETECTED 10/26/2015 2004      IMAGING  Dg Chest 2 View 10/27/2015  1. Interval marked increase in the size of the cardiac silhouette. This may reflect cardiac hypertrophy or chamber dilation but a pericardial effusion is not excluded. There is no pulmonary vascular congestion. 2. There is no pleural effusion or pneumothorax or evidence of a pulmonary contusion. 3. The observed bony thorax exhibits no acute abnormality.  Dg Pelvis 1-2 Views 10/27/2015   Degenerative changes lumbar spine and both hips. No acute abnormality identified.   Dg Shoulder Right 10/27/2015   1. No acute findings 2. Osteoarthritis.   Ct Head Wo Contrast 10/26/2015   No acute findings.  Stable mild atrophy and small vessel disease.   MRI head, MRA head 1. No acute intracranial abnormality. 2. New since 2012 but chronic appearing lacunar infarct of the right lentiform/external capsule. Mild progression of cerebral white matter signal changes since 2012, most commonly due to chronic small vessel disease. Stable small chronic left MCA  infarct. 3. Negative intracranial MRA.  2D Echocardiogram  - Left ventricle: The cavity size was normal. Systolic function wasnormal. The estimated ejection fraction was in the range of 50%to 55%. Wall motion was normal; there were no regional wallmotion abnormalities. - Aortic valve: Trileaflet; mildly thickened, mildly calcifiedleaflets. - Mitral valve: Calcified annulus. There was mild to moderateregurgitation. - Left atrium: The atrium was severely dilated. - Right ventricle: The cavity size was mildly dilated. Wallthickness was normal. Systolic function was mildly reduced. - Right atrium: The atrium was massively dilated. - Tricuspid valve: There was severe regurgitation. - Pulmonary arteries: PA peak pressure: 56 mm Hg (S). Impressions: The right ventricular systolic pressure was increased consistentwith moderate pulmonary hypertension.   PHYSICAL EXAM Pleasant elderly lady not in distress. . Afebrile. Head is nontraumatic. Neck is supple without bruit.    Cardiac exam no murmur or gallop. Lungs are clear to auscultation. Distal pulses are well felt. Neurological Exam ;  Awake  Alert oriented x 3. Normal speech and language.eye movements full without nystagmus.fundi were not visualized. Vision acuity and fields appear normal. Hearing is normal. Palatal movements are normal. Face symmetric. Tongue midline. Normal strength, tone, reflexes and coordination except mild diminished fine finger movements on the right and orbits left over right upper extremity.. Normal sensation. Gait deferred. ASSESSMENT/PLAN Joanna [name] is a [age] y.o. female with history of atrial fibrillation on Coumadin, hypertension, and previous strokes 2008 & 2012 presenting with new onset right-sided weakness. She did not receive IV t-PA due to being beyond time window for treatment consideration; therapeutic INR on Coumadin.   L brain TIA, cardioembolic due to afib with therapeutic INR  MRI  No acute  stroke  MRA  Unremarkable   Carotid Doppler  pending   2D Echo  Moderate pulmonary hypertension , no source of embolus  LDL 59  HgbA1c pending  coumadin for VTE prophylaxis Diet Heart Room service appropriate?: Yes; Fluid consistency:: Thin  warfarin daily prior to admission, now on warfarin daily  Patient counseled to be compliant with her antithrombotic medications  Ongoing aggressive stroke risk factor management  Therapy recommendations:  HH OT  Disposition:  Anticipate return home  Atrial Fibrillation  Home anticoagulation:  coumadin  INR 2.0 on admission  Continue coumadin at discharge   Hypertension  Stable  Other Stroke Risk Factors  Advanced age  Obesity, Body mass index is 34.21 kg/(m^2).   Hx stroke/TIA  12/25/2010 Small left  middle cerebral artery branch infarct not seen on MRI,   status post full-dose IV t-PA.  Other Active Problems  Chronic kidney disease   Splenic infarct  Hospital day # 1  BIBY,SHARON  [facility] See Amion for Pager information 10/27/2015 1:04 PM   I have personally examined this patient, reviewed notes, independently viewed imaging studies, participated in medical decision making and plan of care. I have made any additions or clarifications directly to the above note. Agree with note above. She presented with right facial and weakness and hemiparesis likely left hemispheric TIA versus small infarct of cardiology embolic etiology due to atrial fibrillation with therapeutic INR on warfarin. She remains at risk for neurological worsening, recurrent stroke, TIA and needs ongoing stroke evaluation. I discussed alternatives to warfarin related to patient but she seems quite content with staying on warfarin for now.  Delia Heady, MD Medical Director [facility] Pager: [phone] 10/27/2015 3:53 PM   To contact Stroke Continuity provider, please refer to [email or web address]. After hours, contact  General Neurology

## 2015-10-27 NOTE — Progress Notes (Signed)
PT Cancellation Note  Patient Details Name: Joanna [name] MRN: [id number] DOB: Apr 02, 1941   Cancelled Treatment:    Reason Eval/Treat Not Completed: Patient at procedure or test/unavailable. Will follow up as schedule allows to perform ordered PT eval.   Delorise RoyalsGray, Sumie Remsen [place]  Adelaide Pfefferkorn B. Alexander Mcauley, PT, DPT  10/27/2015, 10:55 AM

## 2015-10-27 NOTE — Evaluation (Signed)
Physical Therapy Evaluation Patient Details Name: Joanna [name] MRN: [id number] DOB: Aug 22, 1941 Today's Date: 10/27/2015   History of Present Illness  74 y.o. female admitted after falling when trying to get out of bed. Pt's son noticed pt was having difficulty standing, ambulating, and was weak on the R side. CT (-), MRI pending for CVA. Marland Kitchen PMH significant for HTN, CKD, CVA (2012), chronic atrial fibrillation, aphasia, dysnomia, and ischemic bowel disease.    Clinical Impression  Pt admitted with above diagnosis. Pt currently with functional limitations due to the deficits listed below (see PT Problem List). Pt currently requires overall min assist. Pt likely would benefit from use of AD for balance (multiple LOB occurred with OT during eval), but requires further training for safety due to cognition. Pt will benefit from skilled PT to increase their independence and safety with mobility to allow discharge to the venue listed below. Pt reports family will check on her and may be able to assist her at d/c but she doesn't see the need at this time. Family not available to confirm if cognition is baseline. Recommending HHPT to continue to address mobility, balance, and decrease fall risk.     Follow Up Recommendations Home health PT;Supervision for mobility/OOB    Equipment Recommendations  Rolling walker with 5" wheels    Recommendations for Other Services       Precautions / Restrictions Precautions Precautions: Fall Restrictions Weight Bearing Restrictions: No      Mobility  Bed Mobility Overal bed mobility: Needs Assistance Bed Mobility: Supine to Sit;Sit to Supine     Supine to sit: Min assist Sit to supine: Min assist   General bed mobility comments: min assist to elevate trunk to get into sitting EOB and extra time for processing  Transfers Overall transfer level: Needs assistance Equipment used: Rolling walker (2 wheeled);None Transfers: Pharmacologist;Sit  to/from Stand Sit to Stand: Min assist Stand pivot transfers: Min assist       General transfer comment: min assist overall with cues for hand placement and technique. Pt reports not using AD prior to admission but continued to want to use RW. When trial with RW, pt requiring cues for safe use of it (at one point had it backwards). Pt performed transfers OOB and on/off toilet  Ambulation/Gait Ambulation/Gait assistance: Min guard Ambulation Distance (Feet): 35 Feet Assistive device: Rolling walker (2 wheeled);None Gait Pattern/deviations: Shuffle;Trunk flexed     General Gait Details: Pt refused to perform longer distance gait outside of the room so assessed in room to simulate home environment. When not using AD, tendency to furniture walk but when given RW, required verbal cues for safe management. Pt would benefit from AD for balance but do not feel at this point she would use it safely and it would be more of a fall hazard. Pt reports she would not use AD upon d/c either.  Stairs            Wheelchair Mobility    Modified Rankin (Stroke Patients Only) Modified Rankin (Stroke Patients Only) Pre-Morbid Rankin Score: Slight disability Modified Rankin: Moderately severe disability     Balance Overall balance assessment: Needs assistance Sitting-balance support: Single extremity supported;Feet supported Sitting balance-Leahy Scale: Fair Sitting balance - Comments: required min assist   Standing balance support: Bilateral upper extremity supported;Single extremity supported;No upper extremity supported;During functional activity Standing balance-Leahy Scale: Poor  Pertinent Vitals/Pain Pain Assessment: No/denies pain    Home Living Family/patient expects to be discharged to:: Private residence Living Arrangements: Alone Available Help at Discharge: Family;Available PRN/intermittently Type of Home: House Home Access: Stairs to  enter Entrance Stairs-Rails: Right;Left;Can reach both Entrance Stairs-Number of Steps: 4 Home Layout: One level Home Equipment: None Additional Comments: Pt's daughter and son live nearby and visit regularly    Prior Function Level of Independence: Independent         Comments: Pt still driving and independent with ADL's and IADLs     Hand Dominance   Dominant Hand: Right    Extremity/Trunk Assessment   Upper Extremity Assessment: Defer to OT evaluation           Lower Extremity Assessment: Generalized weakness      Cervical / Trunk Assessment: Kyphotic  Communication   Communication: Expressive difficulties (difficulty getting words out at times)  Cognition Arousal/Alertness: Awake/alert Behavior During Therapy: WFL for tasks assessed/performed Overall Cognitive Status: Impaired/Different from baseline Area of Impairment: Attention;Memory;Following commands;Safety/judgement;Awareness;Problem solving   Current Attention Level: Sustained Memory: Decreased short-term memory Following Commands: Follows multi-step commands inconsistently Safety/Judgement: Decreased awareness of safety;Decreased awareness of deficits Awareness: Emergent Problem Solving: Slow processing;Difficulty sequencing;Requires verbal cues General Comments: No family present to determine baseline (h/o CVA)    General Comments      Exercises        Assessment/Plan    PT Assessment Patient needs continued PT services  PT Diagnosis Difficulty walking;Generalized weakness;Altered mental status   PT Problem List Decreased strength;Decreased activity tolerance;Decreased balance;Decreased mobility;Decreased coordination;Decreased cognition;Decreased knowledge of use of DME;Decreased safety awareness;Cardiopulmonary status limiting activity  PT Treatment Interventions DME instruction;Stair training;Gait training;Functional mobility training;Therapeutic activities;Therapeutic exercise;Balance  training;Neuromuscular re-education;Cognitive remediation;Patient/family education   PT Goals (Current goals can be found in the Care Plan section) Acute Rehab PT Goals Patient Stated Goal: go home PT Goal Formulation: With patient Time For Goal Achievement: 11/10/15 Potential to Achieve Goals: Good    Frequency Min 4X/week   Barriers to discharge Decreased caregiver support At this time due to cognitive impairments noted on eval would recommend supervision for mobility/OOB due to increased fall risk. Pt reports family can check on patient frequently but would need to confirm. If so, recommend HHPT    Co-evaluation               End of Session Equipment Utilized During Treatment: Gait belt Activity Tolerance: Patient tolerated treatment well;Patient limited by fatigue Patient left: in bed;with call bell/phone within reach;with bed alarm set Nurse Communication: Mobility status;Other (comment) (cognitive impairments)         Time: [id number] PT Time Calculation (min) (ACUTE ONLY): 23 min   Charges:   PT Evaluation $Initial PT Evaluation Tier I: 1 Procedure PT Treatments $Therapeutic Activity: 8-22 mins   PT G Codes:        Karolee StampsGray, Bayan Kushnir Darrol PokeBrescia  Caedan Sumler B. Dariona Postma, PT, DPT  10/27/2015, 4:00 PM

## 2015-10-28 DIAGNOSIS — M6282 Rhabdomyolysis: Secondary | ICD-10-CM

## 2015-10-28 DIAGNOSIS — I482 Chronic atrial fibrillation: Secondary | ICD-10-CM

## 2015-10-28 DIAGNOSIS — G459 Transient cerebral ischemic attack, unspecified: Secondary | ICD-10-CM

## 2015-10-28 LAB — CK: Total CK: 654 U/L — ABNORMAL HIGH (ref 38–234)

## 2015-10-28 LAB — HEMOGLOBIN A1C
Hgb A1c MFr Bld: 6.6 % — ABNORMAL HIGH (ref 4.8–5.6)
Mean Plasma Glucose: 143 mg/dL

## 2015-10-28 LAB — PROTIME-INR
INR: 2.06 — AB (ref 0.00–1.49)
PROTHROMBIN TIME: 23.1 s — AB (ref 11.6–15.2)

## 2015-10-28 MED ORDER — WARFARIN SODIUM 5 MG PO TABS
5.0000 mg | ORAL_TABLET | Freq: Once | ORAL | Status: AC
  Administered 2015-10-28: 5 mg via ORAL
  Filled 2015-10-28: qty 1

## 2015-10-28 NOTE — Care Management Note (Signed)
Case Management Note  Patient Details  Name: Joanna [name] MRN: [id number] Date of Birth: Jan 03, 1941  Subjective/Objective:                    Action/Plan: Met with patient to discuss discharge planning. Patient states that she lives at home alone, but that her daughter and son check on her daily. Per patient, they both work and will not be able to provide 24 hr supervision. Patient stated that she does not need/want 24 hr supervision.  She is also declining home health services as well as the recommended DME.  CM offered to call patient's daughter to provide update. Patient declined to give permission for CM to call.  Dr Algis Liming and bedside RN were updated. Expected Discharge Date:                  Expected Discharge Plan:  Home/Self Care  In-House Referral:     Discharge planning Services  CM Consult  Post Acute Care Choice:  Durable Medical Equipment, Home Health Choice offered to:  Patient  DME Arranged:    DME Agency:     HH Arranged:    [place] Agency:     Status of Service:  In process, will continue to follow  Medicare Important Message Given:    Date Medicare IM Given:    Medicare IM give by:    Date Additional Medicare IM Given:    Additional Medicare Important Message give by:     If discussed at [place] of Stay Meetings, dates discussed:    Additional CommentsRolm Baptise, RN 10/28/2015, 3:04 PM [phone]

## 2015-10-28 NOTE — Progress Notes (Signed)
PROGRESS NOTE    Joanna [name] ZOX:[id number] DOB: November 02, 1941 DOA: 10/26/2015 PCP: Gaye Alken, MD  HPI/Brief narrative 74 year old female patient with history of chronic atrial fibrillation on Coumadin, CVA, HTN, chronic systolic CHF, chronic kidney disease, presented to [facility] ED on 10/27/15 after found to have difficulty walking in right-sided weakness. Patient had tried going to the bathroom the day prior, fell after being tangled in her bed sheets and since then apparently unable to walk. Son found her on the floor and noted to have unsteady gait and weakness on right side. Admitted for stroke workup. Neurology consulted.   Assessment/Plan:  Left brain TIA - With resultant right-sided weakness  - etiology: Cardioembolic due to known chronic atrial fibrillation - MRI brain: No acute stroke  - MRA brain: Unremarkable  - Carotid Doppler: No significant stenosis (bilateral 1-39 percent ICA stenosis. Vertebral artery flow antegrade)  - 2-D echo: LVEF 50-55 percent. Mild-to-moderate mitral regurgitation. Severe tricuspid regurgitation. Moderate pulmonary hypertension  - LDL 59  - Hemoglobin A1c 6.6  - Patient was on warfarin daily prior to admission. Neurology/toxicity discussed with her and she does not want to switch to NOAC and hence Dr. Pearlean Brownie recommended continued Coumadin anticoagulation  - PT and OT: Recommended home health OT, PT, supervision and DME-rolling walker and 3 in 1. Patient however refuses home health services or DME when case management went to discuss with her. She also does not want case management to contact her family regarding support at home. She wishes to go home tomorrow.  - Outpatient follow-up with Dr. Pearlean Brownie in 2 months.  Chronic atrial fibrillation - Controlled ventricular rate. INR therapeutic today. Continue diltiazem, metoprolol and Coumadin per pharmacy  Essential hypertension - Controlled  Stage II chronic kidney disease -  Stable  Mild rhabdomyolysis - Likely from fall. Minimally elevated LFTs may be related to this. Creatinine normal. Follow CK.   DVT prophylaxis: Anticoagulated on Coumadin  Code Status: Full  Family Communication: Declines offer to speak to family  Disposition Plan: DC home 12/15    Consultants:  Neurology  Procedures:  2 D Echo: Study Conclusions  - Left ventricle: The cavity size was normal. Systolic function was normal. The estimated ejection fraction was in the range of 50% to 55%. Wall motion was normal; there were no regional wall motion abnormalities. - Aortic valve: Trileaflet; mildly thickened, mildly calcified leaflets. - Mitral valve: Calcified annulus. There was mild to moderate regurgitation. - Left atrium: The atrium was severely dilated. - Right ventricle: The cavity size was mildly dilated. Wall thickness was normal. Systolic function was mildly reduced. - Right atrium: The atrium was massively dilated. - Tricuspid valve: There was severe regurgitation. - Pulmonary arteries: PA peak pressure: 56 mm Hg (S).  Impressions:  - The right ventricular systolic pressure was increased consistent with moderate pulmonary hypertension.  Antibiotics:  None  Subjective: Denies complaints. Denies right-sided weakness.   Objective: Filed Vitals:   10/27/15 2100 10/28/15 0115 10/28/15 0545 10/28/15 0954  BP: 121/75 159/80 141/76 137/93  Pulse: 83 75 84 79  Temp: 97.5 F (36.4 C) 98.3 F (36.8 C) 98.1 F (36.7 C) 97.9 F (36.6 C)  TempSrc: Oral Oral Oral Oral  Resp: Height:      Weight:      SpO2: 98% 95% 94% 97%    Intake/Output Summary (Last 24 hours) at 10/28/15 1503 Last data filed at 10/28/15 1300  GroJALEIGH MCCROSKEYr  Intake  720 ml  Output      0 ml  Net    720 ml   Filed Weights   10/26/15 2300  Weight: 87.59 kg (193 lb 1.6 oz)     Exam:  General exam: Pleasant elderly female lying comfortably supine in  bed.  Respiratory system: Clear. No increased work of breathing. Cardiovascular system: S1 & S2 heard, RRR. No JVD, murmurs, gallops, clicks or pedal edema. telemetry: A. fib with controlled ventricular rate and occasional PVCs.  Gastrointestinal system: Abdomen is nondistended, soft and nontender. Normal bowel sounds heard. Central nervous system: Alert and oriented 2 . No focal neurological deficits. Extremities: Symmetric 5 x 5 power.   Data Reviewed: Basic Metabolic Panel:  Recent Labs Lab 10/26/15 1825 10/26/15 1835 10/27/15 0745  NA 139 142 136  K 4.0 3.8 3.7  CL 105 103 102  CO2 22  --  24  GLUCOSE 107* 104* 92  BUN CREATININE 1.04* 0.90 0.80  CALCIUM 9.4  --  9.0   Liver Function Tests:  Recent Labs Lab 10/26/15 1825 10/27/15 0745  AST 34 33  ALT 16 14  ALKPHOS 150* 133*  BILITOT 1.8* 1.8*  PROT 7.4 7.3  ALBUMIN 3.5 3.0*   No results for input(s): LIPASE, AMYLASE in the last 168 hours. No results for input(s): AMMONIA in the last 168 hours. CBC:  Recent Labs Lab 10/26/15 1825 10/26/15 1835 10/27/15 0745  WBC 6.6  --  5.2  NEUTROABS 4.7  --   --   HGB 14.0 16.3* 13.9  HCT 44.0 48.0* 42.8  MCV 99.3  --  98.6  PLT 165  --  156   Cardiac Enzymes:  Recent Labs Lab 10/27/15 0745  CKTOTAL 856*   BNP (last 3 results) No results for input(s): PROBNP in the last 8760 hours. CBG: No results for input(s): GLUCAP in the last 168 hours.  No results found for this or any previous visit (from the past 240 hour(s)).         Studies: Dg Chest 2 View  10/27/2015  CLINICAL DATA:  Status post fall striking the right shoulder ; history of acute ischemic CVA, chronic atrial fibrillation and left ventricular dysfunction EXAM: CHEST  2 VIEW COMPARISON:  PA and lateral chest x-ray of August 23, 2007 FINDINGS: There has been marked interval increase in the size of the cardiac silhouette. The pulmonary vascularity is normal. There is no pleural  effusion. There is tortuosity of the descending thoracic aorta with calcification in the wall of the aortic arch. The bony thorax exhibits no acute abnormality. IMPRESSION: 1. Interval marked increase in the size of the cardiac silhouette. This may reflect cardiac hypertrophy or chamber dilation but a pericardial effusion is not excluded. There is no pulmonary vascular congestion. 2. There is no pleural effusion or pneumothorax or evidence of a pulmonary contusion. 3. The observed bony thorax exhibits no acute abnormality. Electronically Signed   By: David  [place] M.D.   On: 10/27/2015 07:53   Dg Pelvis 1-2 Views  10/27/2015  CLINICAL DATA:  Fall.  Initial evaluation. EXAM: PELVIS - 1-2 VIEW COMPARISON:  None. FINDINGS: Degenerative changes lumbar spine and both hips. No evidence of fracture dislocation. Pelvic calcifications noted consistent phleboliths. IMPRESSION: Degenerative changes lumbar spine and both hips. No acute abnormality identified. Electronically Signed   By: Maisie Fus  Register   On: 10/27/2015 07:54   Dg Shoulder Right  10/27/2015  CLINICAL DATA:  Fall.  Pain EXAM: RIGHT  SHOULDER - 2+ VIEW COMPARISON:  None. FINDINGS: The right shoulder is located. No acute fracture or subluxation identified. Mild osteoarthritis identified involving the [facility] and acromioclavicular joint. IMPRESSION: 1. No acute findings 2. Osteoarthritis. Electronically Signed   By: Signa Kellaylor  Stroud M.D.   On: 10/27/2015 07:53   Ct Head Wo Contrast  10/26/2015  CLINICAL DATA:  Fall and weakness.  Initial encounter. EXAM: CT HEAD WITHOUT CONTRAST TECHNIQUE: Contiguous axial images were obtained from the base of the skull through the vertex without intravenous contrast. COMPARISON:  12/27/2010 FINDINGS: Stable mild atrophy and periventricular white matter small vessel ischemic changes. The brain demonstrates no evidence of hemorrhage, infarction, edema, mass effect, extra-axial fluid collection, hydrocephalus or mass  lesion. The skull is unremarkable. IMPRESSION: No acute findings.  Stable mild atrophy and small vessel disease. Electronically Signed   By: Irish LackGlenn  Yamagata M.D.   On: 10/26/2015 19:50   Mr Brain Wo Contrast  10/27/2015  CLINICAL DATA:  74 year old female with difficulty walking and right side weakness since yesterday morning. Initial encounter. EXAM: MRI HEAD WITHOUT CONTRAST MRA HEAD WITHOUT CONTRAST TECHNIQUE: Multiplanar, multiecho pulse sequences of the brain and surrounding structures were obtained without intravenous contrast. Angiographic images of the head were obtained using MRA technique without contrast. COMPARISON:  Head CT without contrast 10/26/2015. Brain MRI 10/28/2011. FINDINGS: MRI HEAD FINDINGS Major intracranial vascular flow voids are stable. Cerebral volume is not significantly changed and is within normal limits for age. No restricted diffusion to suggest acute infarction. No midline shift, mass effect, evidence of mass lesion, ventriculomegaly, extra-axial collection or acute intracranial hemorrhage. Cervicomedullary junction and pituitary are within normal limits. Negative visualized cervical spine. Chronic small cortically based infarct in the left MCA territory is unchanged since 2012. Chronic lacunar type infarct right lentiform nuclei or external capsule is new since 2012. Patchy increase periventricular white matter signal. Otherwise no new gray or white matter signal abnormality. Visible internal auditory structures appear normal. Mastoids are clear. Regressed paranasal sinus disease since 2012. Negative orbit and scalp soft tissues. Visualized bone marrow signal is within normal limits. MRA HEAD FINDINGS Antegrade flow in the posterior circulation with codominant distal vertebral arteries. Patent vertebrobasilar junction. Patent AICA origins. No basilar artery stenosis. SCA and PCA origins are normal. Posterior communicating arteries are diminutive or absent. Bilateral PCA  branches are within normal limits. Antegrade flow in both ICA siphons. No siphon stenosis. Normal ophthalmic artery origins. Normal carotid termini, MCA and ACA origins. Anterior communicating artery and visualized ACA branches are within normal limits ; ACA branch detail is mildly degraded by motion. MCA M1 segments and bifurcations are within normal limits. Visualized MCA branches are within normal limits. IMPRESSION: 1.  No acute intracranial abnormality. 2. New since 2012 but chronic appearing lacunar infarct of the right lentiform/external capsule. Mild progression of cerebral white matter signal changes since 2012, most commonly due to chronic small vessel disease. Stable small chronic left MCA infarct. 3.  Negative intracranial MRA. Electronically Signed   By: Odessa FlemingH  Hall M.D.   On: 10/27/2015 12:18   Mr Maxine GlennMra Head/brain Wo Cm  10/27/2015  CLINICAL DATA:  74 year old female with difficulty walking and right side weakness since yesterday morning. Initial encounter. EXAM: MRI HEAD WITHOUT CONTRAST MRA HEAD WITHOUT CONTRAST TECHNIQUE: Multiplanar, multiecho pulse sequences of the brain and surrounding structures were obtained without intravenous contrast. Angiographic images of the head were obtained using MRA technique without contrast. COMPARISON:  Head CT without contrast 10/26/2015. Brain MRI 10/28/2011. FINDINGS: MRI  HEAD FINDINGS Major intracranial vascular flow voids are stable. Cerebral volume is not significantly changed and is within normal limits for age. No restricted diffusion to suggest acute infarction. No midline shift, mass effect, evidence of mass lesion, ventriculomegaly, extra-axial collection or acute intracranial hemorrhage. Cervicomedullary junction and pituitary are within normal limits. Negative visualized cervical spine. Chronic small cortically based infarct in the left MCA territory is unchanged since 2012. Chronic lacunar type infarct right lentiform nuclei or external capsule is new  since 2012. Patchy increase periventricular white matter signal. Otherwise no new gray or white matter signal abnormality. Visible internal auditory structures appear normal. Mastoids are clear. Regressed paranasal sinus disease since 2012. Negative orbit and scalp soft tissues. Visualized bone marrow signal is within normal limits. MRA HEAD FINDINGS Antegrade flow in the posterior circulation with codominant distal vertebral arteries. Patent vertebrobasilar junction. Patent AICA origins. No basilar artery stenosis. SCA and PCA origins are normal. Posterior communicating arteries are diminutive or absent. Bilateral PCA branches are within normal limits. Antegrade flow in both ICA siphons. No siphon stenosis. Normal ophthalmic artery origins. Normal carotid termini, MCA and ACA origins. Anterior communicating artery and visualized ACA branches are within normal limits ; ACA branch detail is mildly degraded by motion. MCA M1 segments and bifurcations are within normal limits. Visualized MCA branches are within normal limits. IMPRESSION: 1.  No acute intracranial abnormality. 2. New since 2012 but chronic appearing lacunar infarct of the right lentiform/external capsule. Mild progression of cerebral white matter signal changes since 2012, most commonly due to chronic small vessel disease. Stable small chronic left MCA infarct. 3.  Negative intracranial MRA. Electronically Signed   By: Odessa Fleming M.D.   On: 10/27/2015 12:18        Scheduled Meds: . diltiazem  120 mg Oral Daily  . metoprolol  100 mg Oral BID  . warfarin  5 mg Oral ONCE-1800  . Warfarin - Pharmacist Dosing Inpatient   Does not apply q1800   Continuous Infusions: . sodium chloride 10 mL/hr (10/27/15 0109)    Principal Problem:   Acute ischemic left MCA stroke [facility]) Active Problems:   Left ventricular dysfunction   Chronic atrial fibrillation (HCC)   Stroke (cerebrum) (HCC)    Time spent: 25 minutes.    Marcellus Scott, MD, FACP,  FHM. [facility] Pager [phone]  If 7PM-7AM, please contact night-coverage [email or web address] Password [email or web address] 10/28/2015, 3:03 PM    LOS: 2 days

## 2015-10-28 NOTE — Progress Notes (Signed)
Physical Therapy Treatment Patient Details Name: Joanna [name] MRN: [id number] DOB: Jan 11, 1941 Today's Date: 10/28/2015    History of Present Illness 74 y.o. female admitted after falling when trying to get out of bed. Pt's son noticed pt was having difficulty standing, ambulating, and was weak on the R side. CT (-), MRI pending for CVA. Marland Kitchen PMH significant for HTN, CKD, CVA (2012), chronic atrial fibrillation, aphasia, dysnomia, and ischemic bowel disease.    PT Comments    Pt demonstrating improvement overall with mobility today without assistive device at overall supervision to steady assist level. Pt still demonstrates some higher level cognitive impairments and decreased safety awareness. Pt will continue to benefit from skilled PT intervention to address strength, balance, and overall safety with mobility to prepare for d/c home.    Follow Up Recommendations  Home health PT;Supervision for mobility/OOB     Equipment Recommendations  None recommended by PT (Pt safer at this time without AD)    Recommendations for Other Services       Precautions / Restrictions Precautions Precautions: Fall Restrictions Weight Bearing Restrictions: No    Mobility  Bed Mobility Overal bed mobility: Needs Assistance Bed Mobility: Sit to Supine     Supine to sit: Modified independent (Device/Increase time) Sit to supine: Supervision   General bed mobility comments: requires cues and extra time to get BLE back onto bed and reposition in supine  Transfers Overall transfer level: Needs assistance Equipment used: None Transfers: Sit to/from Stand Sit to Stand: Supervision Stand pivot transfers: Supervision       General transfer comment: Overall supervision with cues for hand placement and did not require use of AD for balance today.  Ambulation/Gait Ambulation/Gait assistance: Supervision;Min guard Ambulation Distance (Feet): 200 Feet Assistive device: None Gait  Pattern/deviations: Step-through pattern;Decreased stride length;Drifts right/left     General Gait Details: Intermittent steady assist during longer distance ambulation for balance but no overt LOB occurred.    Stairs Stairs: Yes Stairs assistance: Min assist Stair Management: One rail Left;Step to pattern Number of Stairs: 4 General stair comments: practice to prepare for home entry and recommending assist at d/c. Pt reports her daughter would be available  Wheelchair Mobility    Modified Rankin (Stroke Patients Only)       Balance                                    Cognition Arousal/Alertness: Awake/alert Behavior During Therapy: WFL for tasks assessed/performed Overall Cognitive Status: No family/caregiver present to determine baseline cognitive functioning Area of Impairment: Attention;Safety/judgement;Awareness   Current Attention Level: Selective   Following Commands: Follows multi-step commands inconsistently Safety/Judgement: Decreased awareness of safety;Decreased awareness of deficits Awareness: Emergent   General Comments: No family present to determine baseline (h/o CVA)    Exercises      General Comments General comments (skin integrity, edema, etc.): addressed functional balance within pt room including making bed and cleaning up items at sink. Overall close supervision to steady assist      Pertinent Vitals/Pain Pain Assessment: No/denies pain    Home Living                      Prior Function            PT Goals (current goals can now be found in the care plan section) Acute Rehab PT Goals Patient Stated Goal: go home PT  Goal Formulation: With patient Time For Goal Achievement: 11/10/15 Potential to Achieve Goals: Good Progress towards PT goals: Progressing toward goals    Frequency  Min 4X/week    PT Plan Current plan remains appropriate    Co-evaluation             End of Session Equipment Utilized  During Treatment: Gait belt Activity Tolerance: Patient tolerated treatment well Patient left: in bed;with call bell/phone within reach;with bed alarm set     Time: 1520-1540 PT Time Calculation (min) (ACUTE ONLY): 20 min  Charges:  $Therapeutic Activity: 8-22 mins                    G Codes:      Karolee StampsGray, Chosen Garron Darrol PokeBrescia  Tiffiany Beadles B. Jorgeluis Gurganus, PT, DPT Pager #: [phone]  10/28/2015, 3:47 PM

## 2015-10-28 NOTE — Progress Notes (Signed)
ANTICOAGULATION CONSULT NOTE - Follow-up Consult  Pharmacy Consult for Coumadin Indication: atrial fibrillation  No Known Allergies  Patient Measurements: Height: 5\' 3"  (160 cm) Weight: 193 lb 1.6 oz (87.59 kg) IBW/kg (Calculated) : 52.4  Vital Signs: Temp: 98.1 F (36.7 C) (12/14 0545) Temp Source: Oral (12/14 0545) BP: 141/76 mmHg (12/14 0545) Pulse Rate: 84 (12/14 0545)  Labs:  Recent Labs  10/26/15 1825 10/26/15 1835 10/27/15 0745 10/28/15 0310  HGB 14.0 16.3* 13.9  --   HCT 44.0 48.0* 42.8  --   PLT 165  --  156  --   APTT 34  --   --   --   LABPROT 22.5*  --  20.8* 23.1*  INR 2.00*  --  1.80* 2.06*  CREATININE 1.04* 0.90 0.80  --   CKTOTAL  --   --  856*  --     Estimated Creatinine Clearance: 63.8 mL/min (by C-G formula based on Cr of 0.8).   Medical History: Past Medical History  Diagnosis Date  . Chronic atrial fibrillation (HCC)   . Ischemic bowel disease (HCC)     With GI bleeding  . Splenic infarct   . Left ventricular dysfunction     EF is now 50-55% as of 2012  . Stroke [facility](HCC) February 2012    Now back on coumadin  . Diverticulosis   . Hypertension   . Obesity   . Aphasia 2012  . Dysnomia 12/2010  . Chronic kidney disease   . Acute renal failure (HCC) 02/23/2007-03/22/2007    Now with normalization back to baseline    Medications:  Prescriptions prior to admission  Medication Sig Dispense Refill Last Dose  . diltiazem (CARTIA XT) 120 MG 24 hr capsule Take 1 capsule (120 mg total) by mouth daily. Appointment needed for future refills 90 capsule 3 10/26/2015 at Unknown time  . furosemide (LASIX) 40 MG tablet Take 1 tablet (40 mg total) by mouth daily. 90 tablet 3 10/26/2015 at Unknown time  . metoprolol (LOPRESSOR) 100 MG tablet Take 1 tablet (100 mg total) by mouth 2 (two) times daily. 60 tablet 11 10/26/2015 at 1100  . warfarin (COUMADIN) 5 MG tablet Take 1 to 1.5 tablets by mouth daily as directed by coumadin clinic (Patient taking  differently: Take 5-7.5 mg by mouth See admin instructions. Take 1 tablet by mouth daily except on Fridays take 1 and a half (7.5Mg )) 120 tablet 2 10/25/2015 at Unknown time    Assessment: 74 y.o. female admitted with CVA, h/o Afib, CVA, HTN, and Systolic HF. Pharmacy consulted to continue Coumadin. INR on admission 2.0. Current INR 2.06, Hgb/plt wnl  Warfarin PTA 5 mg daily, except 7.5 mg on Fridays  Goal of Therapy:  INR 2-3 Monitor platelets by anticoagulation protocol: Yes   Plan:  Warfarin 5 mg tonight Monitor for s/sx of bleeding, CBC, and daily INR  Sandi CarneNick Jenny Omdahl, PharmD Pharmacy Resident Pager: [phone] 10/28/2015,8:27 AM

## 2015-10-28 NOTE — Progress Notes (Signed)
Occupational Therapy Treatment Patient Details Name: Joanna [name] MRN: [id number] DOB: Apr 02, 1941 Today's Date: 10/28/2015    History of present illness 74 y.o. female admitted after falling when trying to get out of bed. Pt's son noticed pt was having difficulty standing, ambulating, and was weak on the R side. CT (-), MRI pending for CVA. Marland Kitchen PMH significant for HTN, CKD, CVA (2012), chronic atrial fibrillation, aphasia, dysnomia, and ischemic bowel disease.   OT comments  Pt progressing towards goals.  Functional mobility in room with RW progressing to without AD, with pt demonstrating improved safety without AD (as pt would trunk RW and push it out of way during mobility and transfers).  Toileting and grooming completed with supervision.  Pt completed oral care, hand hygiene, and washing face in standing with supervision.  Donned clean socks with pt able to cross leg over opposite knee to increase success with LB dressing.  Pt will continue to benefit from OT acutely to increase independence and preparation to d/c home with home health.  Follow Up Recommendations  Home health OT;Supervision/Assistance - 24 hour    Equipment Recommendations  3 in 1 bedside comode;Tub/shower seat    Recommendations for Other Services      Precautions / Restrictions Precautions Precautions: Fall Restrictions Weight Bearing Restrictions: No       Mobility Bed Mobility Overal bed mobility: Modified Independent Bed Mobility: Supine to Sit     Supine to sit: Modified independent (Device/Increase time)     General bed mobility comments: Mod I with use of bed rails   Transfers Overall transfer level: Needs assistance Equipment used: Rolling walker (2 wheeled);None Transfers: Sit to/from [employer] Sit to Stand: Supervision Stand pivot transfers: Supervision       General transfer comment: Supervision overall with cues for hand placement and technique.  Pt reports not  using AD prior to admission and having ambulated to/from bathroom with nurse tech without AD this am.  Ambulated to toilet with therapy with RW and without with pt demonstrating improved safety without AD.        ADL       Grooming: Wash/dry hands;Wash/dry face;Oral care;Brushing hair;Modified independent;Standing               Lower Body Dressing: Supervision/safety;Set up;Sit to/from stand   Toilet Transfer: Supervision/safety;BSC (BSC over toilet)   Toileting- Clothing Manipulation and Hygiene: Sit to/from stand;Supervision/safety       Functional mobility during ADLs: Supervision/safety General ADL Comments: Pt overall supervision with functional mobility and bathroom transfers. Pt demonstrating improved activity tolerance and standing balance with grooming tasks at sink.  Pt doffed and donned clean pair of socks without LOB, bringing leg over opposite knee to increase success.                Cognition   Behavior During Therapy: WFL for tasks assessed/performed Overall Cognitive Status: Within Functional Limits for tasks assessed     Current Attention Level: Selective    Following Commands: Follows multi-step commands inconsistently Safety/Judgement: Decreased awareness of safety;Decreased awareness of deficits Awareness: Emergent   General Comments: No family present to determine baseline (h/o CVA)                 Pertinent Vitals/ Pain       Pain Assessment: No/denies pain         Frequency Min 3X/week     Progress Toward Goals  OT Goals(current goals can now be found in the care plan  section)  Progress towards OT goals: Progressing toward goals  Acute Rehab OT Goals Patient Stated Goal: go home OT Goal Formulation: With patient  Plan Discharge plan remains appropriate       End of Session Equipment Utilized During Treatment: Rolling walker   Activity Tolerance Patient tolerated treatment well;No increased pain   Patient Left  (passed  off to PT)   Nurse Communication Mobility status        Time: 1500-1520 OT Time Calculation (min): 20 min  Charges: OT General Charges $OT Visit: 1 Procedure OT Treatments $Self Care/Home Management : 8-22 mins  Rosalio LoudHOXIE, Seydou Hearns, [phone] 10/28/2015, 3:37 PM

## 2015-10-28 NOTE — Progress Notes (Signed)
STROKE TEAM PROGRESS NOTE   SUBJECTIVE (INTERVAL HISTORY) No family at bedside. Pt wants to go home    OBJECTIVE Temp:  [97.5 F (36.4 C)-98.4 F (36.9 C)] 97.9 F (36.6 C) (12/14 0954) Pulse Rate:  [75-91] 79 (12/14 0954) Cardiac Rhythm:  [-] Atrial fibrillation (12/14 0700) Resp:  [18-20] 20 (12/14 0954) BP: (121-159)/(74-93) 137/93 mmHg (12/14 0954) SpO2:  [94 %-98 %] 97 % (12/14 0954)  CBC:   Recent Labs Lab 10/26/15 1825 10/26/15 1835 10/27/15 0745  WBC 6.6  --  5.2  NEUTROABS 4.7  --   --   HGB 14.0 16.3* 13.9  HCT 44.0 48.0* 42.8  MCV 99.3  --  98.6  PLT 165  --  156    Basic Metabolic Panel:   Recent Labs Lab 10/26/15 1825 10/26/15 1835 10/27/15 0745  NA 139 142 136  K 4.0 3.8 3.7  CL 105 103 102  CO2 22  --  24  GLUCOSE 107* 104* 92  BUN 12 14 9   CREATININE 1.04* 0.90 0.80  CALCIUM 9.4  --  9.0    Lipid Panel:     Component Value Date/Time   CHOL 117 10/27/2015 0745   TRIG 122 10/27/2015 0745   HDL 34* 10/27/2015 0745   CHOLHDL 3.4 10/27/2015 0745   VLDL 24 10/27/2015 0745   LDLCALC 59 10/27/2015 0745   HgbA1c:  Lab Results  Component Value Date   HGBA1C 6.6* 10/27/2015   Urine Drug Screen:     Component Value Date/Time   LABOPIA NONE DETECTED 10/26/2015 2004   COCAINSCRNUR NONE DETECTED 10/26/2015 2004   LABBENZ NONE DETECTED 10/26/2015 2004   AMPHETMU NONE DETECTED 10/26/2015 2004   THCU NONE DETECTED 10/26/2015 2004   LABBARB NONE DETECTED 10/26/2015 2004      IMAGING  Dg Chest 2 View 10/27/2015  1. Interval marked increase in the size of the cardiac silhouette. This may reflect cardiac hypertrophy or chamber dilation but a pericardial effusion is not excluded. There is no pulmonary vascular congestion. 2. There is no pleural effusion or pneumothorax or evidence of a pulmonary contusion. 3. The observed bony thorax exhibits no acute abnormality.  Dg Pelvis 1-2 Views 10/27/2015   Degenerative changes lumbar spine and both  hips. No acute abnormality identified.   Dg Shoulder Right 10/27/2015   1. No acute findings 2. Osteoarthritis.   Ct Head Wo Contrast 10/26/2015   No acute findings.  Stable mild atrophy and small vessel disease.   MRI head, MRA head 1. No acute intracranial abnormality. 2. New since 2012 but chronic appearing lacunar infarct of the right lentiform/external capsule. Mild progression of cerebral white matter signal changes since 2012, most commonly due to chronic small vessel disease. Stable small chronic left MCA infarct. 3. Negative intracranial MRA.  2D Echocardiogram  - Left ventricle: The cavity size was normal. Systolic function wasnormal. The estimated ejection fraction was in the range of 50%to 55%. Wall motion was normal; there were no regional wallmotion abnormalities. - Aortic valve: Trileaflet; mildly thickened, mildly calcifiedleaflets. - Mitral valve: Calcified annulus. There was mild to moderateregurgitation. - Left atrium: The atrium was severely dilated. - Right ventricle: The cavity size was mildly dilated. Wallthickness was normal. Systolic function was mildly reduced. - Right atrium: The atrium was massively dilated. - Tricuspid valve: There was severe regurgitation. - Pulmonary arteries: PA peak pressure: 56 mm Hg (S). Impressions: The right ventricular systolic pressure was increased consistentwith moderate pulmonary hypertension.  Carotid Doppler  There is 1-39% bilateral ICA stenosis. Vertebral artery flow is antegrade.     PHYSICAL EXAM Pleasant elderly lady not in distress. . Afebrile. Head is nontraumatic. Neck is supple without bruit.    Cardiac exam no murmur or gallop. Lungs are clear to auscultation. Distal pulses are well felt. Neurological Exam ;  Awake  Alert oriented x 3. Normal speech and language.eye movements full without nystagmus.fundi were not visualized. Vision acuity and fields appear normal. Hearing is normal. Palatal movements are  normal. Face symmetric. Tongue midline. Normal strength, tone, reflexes and coordination except mild diminished fine finger movements on the right and orbits left over right upper extremity.. Normal sensation. Gait deferred.   ASSESSMENT/PLAN Joanna [name] is a 74 y.o. female with history of atrial fibrillation on Coumadin, hypertension, and previous strokes 2008 & 2012 presenting with new onset right-sided weakness. She did not receive IV t-PA due to being beyond time window for treatment consideration; therapeutic INR on Coumadin.   L brain TIA, cardioembolic due to afib with therapeutic INR  MRI  No acute stroke  MRA  Unremarkable   Carotid Doppler  No significant stenosis   2D Echo  Moderate pulmonary hypertension , no source of embolus  LDL 59  HgbA1c 6.6  coumadin for VTE prophylaxis Diet Heart Room service appropriate?: Yes; Fluid consistency:: Thin  warfarin daily prior to admission, now on warfarin daily  Patient counseled to be compliant with her antithrombotic medications  Ongoing aggressive stroke risk factor management  Therapy recommendations:  HH OT, HH PT  Disposition:  Anticipate return home  Atrial Fibrillation  Home anticoagulation:  coumadin  INR 2.0 on admission  Continue coumadin at discharge   Hypertension  Stable  Other Stroke Risk Factors  Advanced age  Obesity, Body mass index is 34.21 kg/(m^2).   Hx stroke/TIA  12/25/2010 Small left middle cerebral artery branch infarct not seen on MRI,   status post full-dose IV t-PA.  Other Active Problems  Chronic kidney disease   Splenic infarct  NOTHING FURTHER TO ADD FROM THE STROKE STANDPOINT  Patient has a 10-15% risk of having another stroke over the next year, the highest risk is within 2 weeks of the most recent stroke/TIA (risk of having a stroke following a stroke or TIA is the same).  Ongoing risk factor control by Primary Care Physician  Stroke Service will sign  off. Please call should any needs arise.  Follow-up Stroke Clinic at [facility] with Dr. Delia Heady in 2 months, order placed.   Hospital day # 2  Rhoderick Moody [facility] See Amion for Pager information 10/28/2015 2:05 PM  I have personally examined this patient, reviewed notes, independently viewed imaging studies, participated in medical decision making and plan of care. I have made any additions or clarifications directly to the above note. Agree with note above. . Stroke service will sign off. Kindly call for questions.  Delia Heady, MD Medical Director [facility] Pager: [phone] 10/28/2015 6:49 PM  To contact Stroke Continuity provider, please refer to [email or web address]. After hours, contact General Neurology

## 2015-10-29 LAB — PROTIME-INR
INR: 2.18 — AB (ref 0.00–1.49)
Prothrombin Time: 24.1 seconds — ABNORMAL HIGH (ref 11.6–15.2)

## 2015-10-29 MED ORDER — WARFARIN SODIUM 5 MG PO TABS
5.0000 mg | ORAL_TABLET | ORAL | Status: DC
  Administered 2015-10-29: 5 mg via ORAL
  Filled 2015-10-29: qty 1

## 2015-10-29 MED ORDER — WARFARIN SODIUM 7.5 MG PO TABS: 7.5000 mg | ORAL_TABLET | ORAL | Status: DC

## 2015-10-29 NOTE — Discharge Summary (Signed)
Physician Discharge Summary  Joanna [name]:[id number] DOB: February 29, 1941 DOA: 10/26/2015  PCP: Gaye Alken, MD  Admit date: 10/26/2015 Discharge date: 10/29/2015  Time spent: Greater than 30 minutes  Recommendations for Outpatient Follow-up:  1. Dr. Karolee Stamps, PCP in 1 week 2. Dr. Maisie Fus [place]/Coumadin clinic in 4 days with repeat labs (PT, INR, CK & CMP) and Coumadin dose adjustment as needed 3. Dr. Delia Heady, Neurology in 2 months.  4. Home health PT, OT, 3 n 1, tub/shower seat   Discharge Diagnoses:  Principal Problem:   Acute ischemic left MCA stroke [facility]) Active Problems:   Left ventricular dysfunction   Chronic atrial fibrillation (HCC)   Stroke (cerebrum) (HCC)   Discharge Condition: Improved & Stable  Diet recommendation: Heart healthy diet.   Filed Weights   10/26/15 2300  Weight: 87.59 kg (193 lb 1.6 oz)    History of present illness:  74 year old female patient with history of chronic atrial fibrillation on Coumadin, CVA, HTN, chronic systolic CHF, chronic kidney disease, presented to [facility] ED on 10/27/15 after found to have difficulty walking & right-sided weakness. Patient had tried going to the bathroom the day prior, fell after being tangled in her bed sheets and since then apparently unable to walk. Son found her on the floor and noted to have unsteady gait and weakness on right side. Admitted for stroke workup. Neurology consulted.  Hospital Course:   Left brain TIA - With resultant right-sided weakness  - etiology: Cardioembolic due to known chronic atrial fibrillation - MRI brain: No acute stroke  - MRA brain: Unremarkable  - Carotid Doppler: No significant stenosis (bilateral 1-39 percent ICA stenosis. Vertebral artery flow antegrade)  - 2-D echo: LVEF 50-55 percent. Mild-to-moderate mitral regurgitation. Severe tricuspid regurgitation. Moderate pulmonary hypertension  - LDL 59  - Hemoglobin A1c 6.6  - Patient was  on warfarin daily prior to admission. Neurology/Dr. Pearlean Brownie discussed with her and she does not want to switch to NOAC and hence Dr. Pearlean Brownie recommended continued Coumadin anticoagulation  - PT and OT: Recommended home health OT, PT, supervision and DME-3 in 1. Patient however refuses home health services or DME when case management went to discuss with her on 12/14. She also does not want case management to contact her family regarding support at home. She declined MDs offered to speak with her family today. - Outpatient follow-up with Dr. Pearlean Brownie in 2 months.  Chronic atrial fibrillation - Controlled ventricular rate. INR therapeutic today. Continue diltiazem, metoprolol and Coumadin  Essential hypertension - Controlled  Stage II chronic kidney disease - Stable  Mild rhabdomyolysis - Likely from fall. Minimally elevated LFTs may be related to this. Creatinine normal. Follow CK-decreasing. Asymptomatic. Outpatient follow-up.   Consultants:  Neurology  Procedures:  2 D Echo: Study Conclusions  - Left ventricle: The cavity size was normal. Systolic function was normal. The estimated ejection fraction was in the range of 50% to 55%. Wall motion was normal; there were no regional wall motion abnormalities. - Aortic valve: Trileaflet; mildly thickened, mildly calcified leaflets. - Mitral valve: Calcified annulus. There was mild to moderate regurgitation. - Left atrium: The atrium was severely dilated. - Right ventricle: The cavity size was mildly dilated. Wall thickness was normal. Systolic function was mildly reduced. - Right atrium: The atrium was massively dilated. - Tricuspid valve: There was severe regurgitation. - Pulmonary arteries: PA peak pressure: 56 mm Hg (S).  Impressions:  - The right ventricular systolic pressure was increased consistent with moderate pulmonary hypertension.  Antibiotics:  None   Discharge Exam:  Complaints: Denies complaints.  No further right-sided weakness.  Filed Vitals:   10/29/15 0600 10/29/15 0946 10/29/15 1300 10/29/15 1409  BP: 135/79 109/59  107/67  Pulse: 83 94  74  Temp: 98.5 F (36.9 C) 98 F (36.7 C) 97 F (36.1 C) 97.9 F (36.6 C)  TempSrc: Oral Oral  Oral  Resp: 18 20  20   Height:      Weight:      SpO2: 97% 97%  95%    General exam: Pleasant elderly female seen ambulating comfortably without assistance with PT supervision, this morning in the halls. Respiratory system: Clear. No increased work of breathing. Cardiovascular system: S1 & S2 heard, RRR. No JVD, murmurs, gallops, clicks or pedal edema.  Gastrointestinal system: Abdomen is nondistended, soft and nontender. Normal bowel sounds heard. Central nervous system: Alert and oriented 2 . No focal neurological deficits. Extremities: Symmetric 5 x 5 power.  Discharge Instructions      Discharge Instructions    (HEART FAILURE PATIENTS) Call MD:  Anytime you have any of the following symptoms: 1) 3 pound weight gain in 24 hours or 5 pounds in 1 week 2) shortness of breath, with or without a dry hacking cough 3) swelling in the hands, feet or stomach 4) if you have to sleep on extra pillows at night in order to breathe.    Complete by:  As directed      Ambulatory referral to Neurology    Complete by:  As directed   Please schedule post stroke follow up in 2 months.     Call MD for:  difficulty breathing, headache or visual disturbances    Complete by:  As directed      Call MD for:  extreme fatigue    Complete by:  As directed      Call MD for:  persistant dizziness or light-headedness    Complete by:  As directed      Call MD for:  persistant nausea and vomiting    Complete by:  As directed      Call MD for:  severe uncontrolled pain    Complete by:  As directed      Call MD for:  temperature >100.4    Complete by:  As directed      Call MD for:    Complete by:  As directed   Strokelike symptoms.     Diet - low sodium  heart healthy    Complete by:  As directed      Increase activity slowly    Complete by:  As directed             Medication List    TAKE these medications        diltiazem 120 MG 24 hr capsule  Commonly known as:  CARTIA XT  Take 1 capsule (120 mg total) by mouth daily. Appointment needed for future refills     furosemide 40 MG tablet  Commonly known as:  LASIX  Take 1 tablet (40 mg total) by mouth daily.     metoprolol 100 MG tablet  Commonly known as:  LOPRESSOR  Take 1 tablet (100 mg total) by mouth 2 (two) times daily.     warfarin 5 MG tablet  Commonly known as:  COUMADIN  Take 1 to 1.5 tablets by mouth daily as directed by coumadin clinic       Follow-up Information    Follow up with  SETHI,PRAMOD, MD In 2 months.   Specialties:  Neurology, Radiology   Why:  Stroke Clinic, Office will call you with appointment date & time   Contact information:   [address] [place] [address] [phone]       Follow up with Gaye Alken, MD. Schedule an appointment as soon as possible for a visit in 1 week.   Specialty:  Family Medicine   Contact information:   [address] [place] [address] [phone]       Follow up with Ronny Flurry, MD. Schedule an appointment as soon as possible for a visit in 4 days.   Specialty:  Cardiology   Why:  Follow-up with Coumadin clinic for repeat labs (PT, INR, CK & CMP) and Coumadin adjustment as needed.   Contact information:   [address] [place] [address] [phone]        The results of significant diagnostics from this hospitalization (including imaging, microbiology, ancillary and laboratory) are listed below for reference.    Significant Diagnostic Studies: Dg Chest 2 View  10/27/2015  CLINICAL DATA:  Status post fall striking the right shoulder ; history of acute ischemic CVA, chronic atrial fibrillation and left ventricular dysfunction EXAM: CHEST  2  VIEW COMPARISON:  PA and lateral chest x-ray of August 23, 2007 FINDINGS: There has been marked interval increase in the size of the cardiac silhouette. The pulmonary vascularity is normal. There is no pleural effusion. There is tortuosity of the descending thoracic aorta with calcification in the wall of the aortic arch. The bony thorax exhibits no acute abnormality. IMPRESSION: 1. Interval marked increase in the size of the cardiac silhouette. This may reflect cardiac hypertrophy or chamber dilation but a pericardial effusion is not excluded. There is no pulmonary vascular congestion. 2. There is no pleural effusion or pneumothorax or evidence of a pulmonary contusion. 3. The observed bony thorax exhibits no acute abnormality. Electronically Signed   By: David  [place] M.D.   On: 10/27/2015 07:53   Dg Pelvis 1-2 Views  10/27/2015  CLINICAL DATA:  Fall.  Initial evaluation. EXAM: PELVIS - 1-2 VIEW COMPARISON:  None. FINDINGS: Degenerative changes lumbar spine and both hips. No evidence of fracture dislocation. Pelvic calcifications noted consistent phleboliths. IMPRESSION: Degenerative changes lumbar spine and both hips. No acute abnormality identified. Electronically Signed   By: Maisie Fus  Register   On: 10/27/2015 07:54   Dg Shoulder Right  10/27/2015  CLINICAL DATA:  Fall.  Pain EXAM: RIGHT SHOULDER - 2+ VIEW COMPARISON:  None. FINDINGS: The right shoulder is located. No acute fracture or subluxation identified. Mild osteoarthritis identified involving the [facility] joint and acromioclavicular joint. IMPRESSION: 1. No acute findings 2. Osteoarthritis. Electronically Signed   By: Signa Kell M.D.   On: 10/27/2015 07:53   Ct Head Wo Contrast  10/26/2015  CLINICAL DATA:  Fall and weakness.  Initial encounter. EXAM: CT HEAD WITHOUT CONTRAST TECHNIQUE: Contiguous axial images were obtained from the base of the skull through the vertex without intravenous contrast. COMPARISON:  12/27/2010 FINDINGS: Stable mild  atrophy and periventricular white matter small vessel ischemic changes. The brain demonstrates no evidence of hemorrhage, infarction, edema, mass effect, extra-axial fluid collection, hydrocephalus or mass lesion. The skull is unremarkable. IMPRESSION: No acute findings.  Stable mild atrophy and small vessel disease. Electronically Signed   By: Irish Lack M.D.   On: 10/26/2015 19:50   Mr Brain Wo Contrast  10/27/2015  CLINICAL DATA:  74 year old female with difficulty walking and right side weakness since yesterday morning. Initial encounter. EXAM: MRI HEAD WITHOUT CONTRAST MRA HEAD WITHOUT CONTRAST TECHNIQUE: Multiplanar, multiecho pulse sequences of the brain and surrounding structures were obtained without intravenous contrast. Angiographic images of the head were obtained using MRA technique without contrast. COMPARISON:  Head CT without contrast 10/26/2015. Brain MRI 10/28/2011. FINDINGS: MRI HEAD FINDINGS Major intracranial vascular flow voids are stable. Cerebral volume is not significantly changed and is within normal limits for age. No restricted diffusion to suggest acute infarction. No midline shift, mass effect, evidence of mass lesion, ventriculomegaly, extra-axial collection or acute intracranial hemorrhage. Cervicomedullary junction and pituitary are within normal limits. Negative visualized cervical spine. Chronic small cortically based infarct in the left MCA territory is unchanged since 2012. Chronic lacunar type infarct right lentiform nuclei or external capsule is new since 2012. Patchy increase periventricular white matter signal. Otherwise no new gray or white matter signal abnormality. Visible internal auditory structures appear normal. Mastoids are clear. Regressed paranasal sinus disease since 2012. Negative orbit and scalp soft tissues. Visualized bone marrow signal is within normal limits. MRA HEAD FINDINGS Antegrade flow in the posterior circulation with codominant distal  vertebral arteries. Patent vertebrobasilar junction. Patent AICA origins. No basilar artery stenosis. SCA and PCA origins are normal. Posterior communicating arteries are diminutive or absent. Bilateral PCA branches are within normal limits. Antegrade flow in both ICA siphons. No siphon stenosis. Normal ophthalmic artery origins. Normal carotid termini, MCA and ACA origins. Anterior communicating artery and visualized ACA branches are within normal limits ; ACA branch detail is mildly degraded by motion. MCA M1 segments and bifurcations are within normal limits. Visualized MCA branches are within normal limits. IMPRESSION: 1.  No acute intracranial abnormality. 2. New since 2012 but chronic appearing lacunar infarct of the right lentiform/external capsule. Mild progression of cerebral white matter signal changes since 2012, most commonly due to chronic small vessel disease. Stable small chronic left MCA infarct. 3.  Negative intracranial MRA. Electronically Signed   By: Odessa Fleming M.D.   On: 10/27/2015 12:18   Mr Maxine Glenn Head/brain Wo Cm  10/27/2015  CLINICAL DATA:  74 year old female with difficulty walking and right side weakness since yesterday morning. Initial encounter. EXAM: MRI HEAD WITHOUT CONTRAST MRA HEAD WITHOUT CONTRAST TECHNIQUE: Multiplanar, multiecho pulse sequences of the brain and surrounding structures were obtained without intravenous contrast. Angiographic images of the head were obtained using MRA technique without contrast. COMPARISON:  Head CT without contrast 10/26/2015. Brain MRI 10/28/2011. FINDINGS: MRI HEAD FINDINGS Major intracranial vascular flow voids are stable. Cerebral volume is not significantly changed and is within normal limits for age. No restricted diffusion to suggest acute infarction. No midline shift, mass effect, evidence of mass lesion, ventriculomegaly, extra-axial collection or acute intracranial hemorrhage. Cervicomedullary junction and pituitary are within normal limits.  Negative visualized cervical spine. Chronic small cortically based infarct in the left MCA territory is unchanged since 2012. Chronic lacunar type infarct right lentiform nuclei or external capsule is new since 2012. Patchy increase periventricular white matter signal. Otherwise no new gray or white matter signal abnormality. Visible internal auditory structures appear normal. Mastoids are clear. Regressed paranasal sinus disease since 2012. Negative orbit and scalp soft tissues. Visualized bone marrow signal is within normal limits. MRA HEAD FINDINGS Antegrade flow in the posterior circulation with codominant distal vertebral arteries. Patent vertebrobasilar junction. Patent AICA origins. No basilar artery stenosis. SCA and PCA origins are normal. Posterior communicating arteries are diminutive or absent.  Bilateral PCA branches are within normal limits. Antegrade flow in both ICA siphons. No siphon stenosis. Normal ophthalmic artery origins. Normal carotid termini, MCA and ACA origins. Anterior communicating artery and visualized ACA branches are within normal limits ; ACA branch detail is mildly degraded by motion. MCA M1 segments and bifurcations are within normal limits. Visualized MCA branches are within normal limits. IMPRESSION: 1.  No acute intracranial abnormality. 2. New since 2012 but chronic appearing lacunar infarct of the right lentiform/external capsule. Mild progression of cerebral white matter signal changes since 2012, most commonly due to chronic small vessel disease. Stable small chronic left MCA infarct. 3.  Negative intracranial MRA. Electronically Signed   By: Odessa Fleming M.D.   On: 10/27/2015 12:18    Microbiology: No results found for this or any previous visit (from the past 240 hour(s)).   Labs: Basic Metabolic Panel:  Recent Labs Lab 10/26/15 1825 10/26/15 1835 10/27/15 0745  NA 139 142 136  K 4.0 3.8 3.7  CL 105 103 102  CO2 22  --  24  GLUCOSE 107* 104* 92  BUN 12 14 9    CREATININE 1.04* 0.90 0.80  CALCIUM 9.4  --  9.0   Liver Function Tests:  Recent Labs Lab 10/26/15 1825 10/27/15 0745  AST 34 33  ALT 16 14  ALKPHOS 150* 133*  BILITOT 1.8* 1.8*  PROT 7.4 7.3  ALBUMIN 3.5 3.0*   No results for input(s): LIPASE, AMYLASE in the last 168 hours. No results for input(s): AMMONIA in the last 168 hours. CBC:  Recent Labs Lab 10/26/15 1825 10/26/15 1835 10/27/15 0745  WBC 6.6  --  5.2  NEUTROABS 4.7  --   --   HGB 14.0 16.3* 13.9  HCT 44.0 48.0* 42.8  MCV 99.3  --  98.6  PLT 165  --  156   Cardiac Enzymes:  Recent Labs Lab 10/27/15 0745 10/28/15 1554  CKTOTAL 856* 654*   BNP: BNP (last 3 results) No results for input(s): BNP in the last 8760 hours.  ProBNP (last 3 results) No results for input(s): PROBNP in the last 8760 hours.  CBG: No results for input(s): GLUCAP in the last 168 hours.    Signed:  Marcellus Scott, MD, FACP, FHM. [facility] Pager [phone]  If 7PM-7AM, please contact night-coverage [email or web address] Password [email or web address] 10/29/2015, 3:02 PM

## 2015-10-29 NOTE — Progress Notes (Signed)
Discharge orders received. Pt for discharge home today. IV and telemetry D/C'd. Prescription and D/C instructions given with verbalized understanding. Family at bedside to assist with D/C and pt education. Staff brought Pt downstairs via wheelchair for D/C. Delfino Lovettichie Valentino Saavedra, RN, BSN 10/29/2015 8:10 PM

## 2015-10-29 NOTE — Progress Notes (Signed)
ANTICOAGULATION CONSULT NOTE - FOLLOW UP  Pharmacy Consult:  Coumadin Indication: atrial fibrillation  No Known Allergies  Patient Measurements: Height: 5\' 3"  (160 cm) Weight: 193 lb 1.6 oz (87.59 kg) IBW/kg (Calculated) : 52.4  Vital Signs: Temp: 98.5 F (36.9 C) (12/15 0600) Temp Source: Oral (12/15 0600) BP: 135/79 mmHg (12/15 0600) Pulse Rate: 83 (12/15 0600)  Labs:  Recent Labs  10/26/15 1825 10/26/15 1835 10/27/15 0745 10/28/15 0310 10/28/15 1554 10/29/15 0538  HGB 14.0 16.3* 13.9  --   --   --   HCT 44.0 48.0* 42.8  --   --   --   PLT 165  --  156  --   --   --   APTT 34  --   --   --   --   --   LABPROT 22.5*  --  20.8* 23.1*  --  24.1*  INR 2.00*  --  1.80* 2.06*  --  2.18*  CREATININE 1.04* 0.90 0.80  --   --   --   CKTOTAL  --   --  856*  --  654*  --     Estimated Creatinine Clearance: 63.8 mL/min (by C-G formula based on Cr of 0.8).   Medical History: Past Medical History  Diagnosis Date  . Chronic atrial fibrillation (HCC)   . Ischemic bowel disease (HCC)     With GI bleeding  . Splenic infarct   . Left ventricular dysfunction     EF is now 50-55% as of 2012  . Stroke [facility](HCC) February 2012    Now back on coumadin  . Diverticulosis   . Hypertension   . Obesity   . Aphasia 2012  . Dysnomia 12/2010  . Chronic kidney disease   . Acute renal failure (HCC) 02/23/2007-03/22/2007    Now with normalization back to baseline     Assessment: 74 YOM continues on Coumadin from PTA for history of Afib.  INR therapeutic; no bleeding reported.   Goal of Therapy:  INR 2-3    Plan:  - Resume home regimen:  Coumadin 5mg  PO daily except 7.5mg  on Fri - Daily PT / INR for now    Evonne Rinks D. Laney Potashang, PharmD, BCPS Pager:  [phone] 10/29/2015, 8:23 AM

## 2015-10-29 NOTE — Progress Notes (Signed)
Physical Therapy Treatment Patient Details Name: Joanna Riffleatricia A Vanleeuwen MRN: [id number] DOB: 03-Nov-1941 Today's Date: 10/29/2015    History of Present Illness 74 y.o. female admitted after falling when trying to get out of bed. Pt's son noticed pt was having difficulty standing, ambulating, and was weak on the R side. CT (-), MRI pending for CVA. Marland Kitchen. PMH significant for HTN, CKD, CVA (2012), chronic atrial fibrillation, aphasia, dysnomia, and ischemic bowel disease.    PT Comments    Patient progressing slowly towards PT goals. Tolerated stair training today with only Min guard assist for safety. Recommend family present to negotiate steps to enter home. Pt still with some impaired safety awareness but ambulating with supervision with no overt LOB. Will continue to follow per current POC.   Follow Up Recommendations  Home health PT;Supervision for mobility/OOB     Equipment Recommendations  None recommended by PT    Recommendations for Other Services       Precautions / Restrictions Precautions Precautions: Fall Restrictions Weight Bearing Restrictions: No    Mobility  Bed Mobility Overal bed mobility: Needs Assistance Bed Mobility: Sit to Supine       Sit to supine: Supervision;HOB elevated   General bed mobility comments: ABle to bring BLEs into bed without assist. Cues for repositioning using UEs.   Transfers Overall transfer level: Needs assistance Equipment used: None Transfers: Sit to/from Stand Sit to Stand: Supervision         General transfer comment: Overall supervision with cues for hand placement and did not require use of AD for balance today. Stood from AllstateEOB x1, from toilet x1.  Ambulation/Gait Ambulation/Gait assistance: Supervision Ambulation Distance (Feet): 200 Feet Assistive device: None Gait Pattern/deviations: Step-through pattern;Decreased stride length;Drifts right/left Gait velocity: 1.2 ft/sec Gait velocity interpretation: <1.8 ft/sec,  indicative of risk for recurrent falls General Gait Details: Very slow, steady gait with mild drifting noted but no LOB.    Stairs Stairs: Yes Stairs assistance: Min guard Stair Management: One rail Left;Step to pattern Number of Stairs: 3 General stair comments: Close min guard for safety and cues for technique. Recommend son present for entry into home for safety. Pt agreeable.  Wheelchair Mobility    Modified Rankin (Stroke Patients Only) Modified Rankin (Stroke Patients Only) Pre-Morbid Rankin Score: Slight disability Modified Rankin: Moderately severe disability     Balance Overall balance assessment: Needs assistance Sitting-balance support: Feet supported;No upper extremity supported Sitting balance-Leahy Scale: Good Sitting balance - Comments: Mod I with perciare and pulling up underwear.   Standing balance support: During functional activity Standing balance-Leahy Scale: Fair Standing balance comment: Tolerated tidying up room without support - adjusing chuck pad, wiping down sink, brushing teeth, washing hands without LOB or difficulty.                     Cognition Arousal/Alertness: Awake/alert Behavior During Therapy: WFL for tasks assessed/performed Overall Cognitive Status: No family/caregiver present to determine baseline cognitive functioning Area of Impairment: Safety/judgement;Awareness         Safety/Judgement: Decreased awareness of safety;Decreased awareness of deficits Awareness: Emergent   General Comments: No family present to determine baseline (h/o CVA)    Exercises      General Comments        Pertinent Vitals/Pain Pain Assessment: No/denies pain    Home Living                      Prior Function  PT Goals (current goals can now be found in the care plan section) Progress towards PT goals: Progressing toward goals    Frequency  Min 4X/week    PT Plan Current plan remains appropriate     Co-evaluation             End of Session Equipment Utilized During Treatment: Gait belt Activity Tolerance: Patient tolerated treatment well Patient left: in bed;with call bell/phone within reach;with bed alarm set     Time: 0828-0859 PT Time Calculation (min) (ACUTE ONLY): 31 min  Charges:  $Gait Training: 23-37 mins                    G Codes:      Hannah Crill A Cosimo Schertzer 10/29/2015, 9:08 AM Mylo Red, PT, DPT [phone]

## 2015-10-29 NOTE — Care Management Important Message (Signed)
Important Message  Patient Details  Name: Joanna [name] MRN: [id number] Date of Birth: 1940/12/17   Medicare Important Message Given:  Yes    Rayvon CharSTUTTS, Lizette Pazos G 10/29/2015, 1:52 PMImportant Message  Patient Details  Name: Joanna [name] MRN: [id number] Date of Birth: 1940/12/17   Medicare Important Message Given:  Yes    Rayjon Wery, Sula SodaINA G 10/29/2015, 1:52 PM

## 2015-10-29 NOTE — Progress Notes (Signed)
Occupational Therapy Treatment Patient Details Name: SHAKYRA MATTERA MRN: [id number] DOB: [birth year] Today's Date: 10/29/2015    History of present illness 74 y.o. female admitted after falling when trying to get out of bed. Pt's son noticed pt was having difficulty standing, ambulating, and was weak on the R side. CT (-), MRI pending for CVA. Marland Kitchen PMH significant for HTN, CKD, CVA (2012), chronic atrial fibrillation, aphasia, dysnomia, and ischemic bowel disease.   OT comments  Pt progressing well toward OT goals. Pt completed all ADLs and mobility with supervision EXCEPT tub transfer pt required min assist for LOB x3. Still unsure if pt is at her cognitive baseline and have not been able to speak with family, pt has decreased awareness of safety and deficits. Discussed how pt could benefit from DME in bathroom and bedroom for fall prevention, but pt refused need for these items. Also discussed potential for HHOT to complete fall risk evaluation at pt's home, but she also declined need for this.    Follow Up Recommendations  Home health OT;Supervision/Assistance - 24 hour    Equipment Recommendations  3 in 1 bedside comode;Tub/shower seat    Recommendations for Other Services      Precautions / Restrictions Precautions Precautions: Fall Restrictions Weight Bearing Restrictions: No       Mobility Bed Mobility Overal bed mobility: Needs Assistance Bed Mobility: Supine to Sit;Sit to Supine     Supine to sit: Modified independent (Device/Increase time) Sit to supine: Modified independent (Device/Increase time)   General bed mobility comments: HOB flat, no use of bedrails to simulate home environment. Pt grabs onto side of mattress at home - dicussed option of bedrails especially since pt fell getting out of bed prior to this admission, but pt refused.  Transfers Overall transfer level: Needs assistance Equipment used: None Transfers: Sit to/from Stand Sit to Stand:  Supervision         General transfer comment: Overall supervision for transfers EXCEPT tub transfer which required min assist for LOB x3. No AD used for ambulation today.    Balance Overall balance assessment: Needs assistance Sitting-balance support: No upper extremity supported;Feet supported Sitting balance-Leahy Scale: Good     Standing balance support: No upper extremity supported;During functional activity Standing balance-Leahy Scale: Fair Standing balance comment: Slight LOB standing in hallway conversing with OT - no physical assist required to regain balance                   ADL Overall ADL's : Needs assistance/impaired     Grooming: Wash/dry hands;Modified independent                   Toilet Transfer: Supervision/safety;Ambulation;BSC   Toileting- Architect and Hygiene: Supervision/safety;Sit to/from stand   Tub/ Shower Transfer: Tub transfer;Minimal assistance;Cueing for safety;Ambulation   Functional mobility during ADLs: Supervision/safety General ADL Comments: Overall, pt demonstrating improved functional mobility, but still has decreased Overall, pt demonstrating improved functional mobility, but still has decreased safety awareness and declines suggestions for fall prevention at home. Pt was min assist for tub transfer and had LOB x3 getting in and out and required physical assist to regain balance. Feel pt would benefit from DME in bathroom (especially shower seat), but declines needing them. Still question pt's cognition, but family has not been present to determine if pt is at baseline. Pt has decreased safety and deficit awareness, and some difficulty with word finding.awareness and declines suggestions for fall prevention at home. Pt was min assist  for tub transfer and had LOB x3 getting in and out and required physical assist to regain balance. Feel pt would benefit from DME in bathroom (especially shower seat), but declines needing  them.      Vision                     Perception     Praxis      Cognition   Behavior During Therapy: [facility] for tasks assessed/performed Overall Cognitive Status: No family/caregiver present to determine baseline cognitive functioning Area of Impairment: Safety/judgement;Awareness          Safety/Judgement: Decreased awareness of safety;Decreased awareness of deficits Awareness: Emergent   General Comments: No family present to determine baseline (h/o CVA)    Extremity/Trunk Assessment               Exercises     Shoulder Instructions       General Comments      Pertinent Vitals/ Pain       Pain Assessment: No/denies pain  Home Living                                          Prior Functioning/Environment              Frequency Min 3X/week     Progress Toward Goals  OT Goals(current goals can now be found in the care plan section)  Progress towards OT goals: Progressing toward goals  Acute Rehab OT Goals Patient Stated Goal: go home OT Goal Formulation: With patient Time For Goal Achievement: 11/10/15 Potential to Achieve Goals: Good ADL Goals Pt Will Perform Grooming: with modified independence;standing Pt Will Perform Upper Body Bathing: with modified independence;sitting;standing Pt Will Perform Lower Body Bathing: with modified independence;sitting/lateral leans;sit to/from stand Pt Will Perform Upper Body Dressing: with modified independence;sitting;standing Pt Will Perform Lower Body Dressing: with modified independence;sitting/lateral leans;sit to/from stand Pt Will Transfer to Toilet: with supervision;bedside commode;ambulating Pt Will Perform Toileting - Clothing Manipulation and hygiene: with supervision;sitting/lateral leans;sit to/from stand Pt Will Perform Tub/Shower Transfer: Tub transfer;with supervision;ambulating;shower seat;rolling walker  Plan Discharge plan remains appropriate     Co-evaluation                 End of Session Equipment Utilized During Treatment: Rolling walker   Activity Tolerance Patient tolerated treatment well   Patient Left in bed;with call bell/phone within reach;with bed alarm set   Nurse Communication Mobility status        Time: 1326-1350 OT Time Calculation (min): 24 min  Charges: OT General Charges $OT Visit: 1 Procedure OT Treatments $Self Care/Home Management : 23-37 mins  Nils PyleJulia Eldene Plocher, OTR/L 10/29/2015, 2:52 PM

## 2015-11-30 ENCOUNTER — Ambulatory Visit (INDEPENDENT_AMBULATORY_CARE_PROVIDER_SITE_OTHER): Payer: Medicare Other | Admitting: *Deleted

## 2015-11-30 DIAGNOSIS — I4891 Unspecified atrial fibrillation: Secondary | ICD-10-CM

## 2015-11-30 DIAGNOSIS — Z5181 Encounter for therapeutic drug level monitoring: Secondary | ICD-10-CM | POA: Diagnosis not present

## 2015-11-30 DIAGNOSIS — I482 Chronic atrial fibrillation, unspecified: Secondary | ICD-10-CM

## 2015-11-30 LAB — POCT INR: INR: 2.3

## 2015-12-24 ENCOUNTER — Ambulatory Visit (INDEPENDENT_AMBULATORY_CARE_PROVIDER_SITE_OTHER): Payer: Medicare Other | Admitting: Pharmacist

## 2015-12-24 ENCOUNTER — Telehealth: Payer: Self-pay | Admitting: Pharmacist

## 2015-12-24 DIAGNOSIS — I4891 Unspecified atrial fibrillation: Secondary | ICD-10-CM | POA: Diagnosis not present

## 2015-12-24 DIAGNOSIS — I482 Chronic atrial fibrillation, unspecified: Secondary | ICD-10-CM

## 2015-12-24 DIAGNOSIS — Z5181 Encounter for therapeutic drug level monitoring: Secondary | ICD-10-CM | POA: Diagnosis not present

## 2015-12-24 LAB — POCT INR: INR: 2.2

## 2015-12-24 NOTE — Telephone Encounter (Addendum)
Patient is scheduled to have a screening colonoscopy this month. Coumadin clearance was sent over on 10/15/15. On 10/26/15, pt presented to the hospital and had a new TIA. She had already had 2 previously (2008 and 2012). The plan was to bridge her with Lovenox with a CHADS2 score of 5, however with her recent TIA since the clearance was sent, we would prefer for the pt to not have a screening colonoscopy because of her TIA within the last 2 months. Called [facility] twice and left a message, eventually spoke with Velna Hatchet who reported that Dr. Evette Cristal who is doing the colonoscopy is working inpatient this week and would not get the message until next week. Discussed with DOD Dr. Johney Frame and came up with 2 options: performing colonoscopy on Coumadin and if any polyps are found, pt would need a second colonoscopy, or postponing the colonoscopy altogether since it's for screening (ideally 6 months after most recent TIA and still bridging the patient with Lovenox). [facility] stated that they would follow up with the pt and [place] next week with their preference once Dr. Evette Cristal can be reached.

## 2015-12-31 ENCOUNTER — Ambulatory Visit: Payer: Medicare Other | Admitting: Neurology

## 2016-01-14 NOTE — Telephone Encounter (Signed)
Spoke with Wilkie Aye at [facility] for an update on pt's colonoscopy. They would like to do a virtual colonoscopy so that pt can stay on her Coumadin. If anything is found, would postpone follow up colonoscopy until the summer given TIA in December. Agree with plan since pt is high risk. No date set yet for virtual colonoscopy.

## 2016-01-22 ENCOUNTER — Other Ambulatory Visit: Payer: Self-pay | Admitting: Gastroenterology

## 2016-01-22 DIAGNOSIS — Z1211 Encounter for screening for malignant neoplasm of colon: Secondary | ICD-10-CM

## 2016-01-28 ENCOUNTER — Ambulatory Visit: Payer: Medicare Other | Admitting: Neurology

## 2016-02-08 ENCOUNTER — Ambulatory Visit (INDEPENDENT_AMBULATORY_CARE_PROVIDER_SITE_OTHER): Payer: Medicare Other | Admitting: *Deleted

## 2016-02-08 DIAGNOSIS — Z5181 Encounter for therapeutic drug level monitoring: Secondary | ICD-10-CM

## 2016-02-08 DIAGNOSIS — I482 Chronic atrial fibrillation, unspecified: Secondary | ICD-10-CM

## 2016-02-08 DIAGNOSIS — I4891 Unspecified atrial fibrillation: Secondary | ICD-10-CM | POA: Diagnosis not present

## 2016-02-08 LAB — POCT INR: INR: 1.7

## 2016-02-18 ENCOUNTER — Inpatient Hospital Stay: Admission: RE | Admit: 2016-02-18 | Payer: Medicare Other | Source: Ambulatory Visit

## 2016-02-22 ENCOUNTER — Ambulatory Visit
Admission: RE | Admit: 2016-02-22 | Discharge: 2016-02-22 | Disposition: A | Discharge status: Home / Self Care | Payer: Medicare Other | Source: Ambulatory Visit | Attending: Gastroenterology | Admitting: Gastroenterology

## 2016-02-22 ENCOUNTER — Ambulatory Visit: Payer: Medicare Other | Admitting: Neurology

## 2016-02-22 DIAGNOSIS — Z1211 Encounter for screening for malignant neoplasm of colon: Secondary | ICD-10-CM

## 2016-02-29 ENCOUNTER — Inpatient Hospital Stay: Admission: RE | Admit: 2016-02-29 | Payer: Medicare Other | Source: Ambulatory Visit

## 2016-03-02 ENCOUNTER — Ambulatory Visit (INDEPENDENT_AMBULATORY_CARE_PROVIDER_SITE_OTHER): Payer: Medicare Other | Admitting: *Deleted

## 2016-03-02 DIAGNOSIS — I482 Chronic atrial fibrillation, unspecified: Secondary | ICD-10-CM

## 2016-03-02 DIAGNOSIS — I4891 Unspecified atrial fibrillation: Secondary | ICD-10-CM | POA: Diagnosis not present

## 2016-03-02 DIAGNOSIS — Z5181 Encounter for therapeutic drug level monitoring: Secondary | ICD-10-CM | POA: Diagnosis not present

## 2016-03-02 LAB — POCT INR: INR: 2.7

## 2016-04-01 ENCOUNTER — Ambulatory Visit (INDEPENDENT_AMBULATORY_CARE_PROVIDER_SITE_OTHER): Payer: Medicare Other | Admitting: *Deleted

## 2016-04-01 DIAGNOSIS — I482 Chronic atrial fibrillation, unspecified: Secondary | ICD-10-CM

## 2016-04-01 DIAGNOSIS — Z5181 Encounter for therapeutic drug level monitoring: Secondary | ICD-10-CM | POA: Diagnosis not present

## 2016-04-01 DIAGNOSIS — I4891 Unspecified atrial fibrillation: Secondary | ICD-10-CM | POA: Diagnosis not present

## 2016-04-01 LAB — POCT INR: INR: 1.5

## 2016-04-08 ENCOUNTER — Ambulatory Visit: Payer: Medicare Other | Admitting: Nurse Practitioner

## 2016-04-18 ENCOUNTER — Ambulatory Visit (INDEPENDENT_AMBULATORY_CARE_PROVIDER_SITE_OTHER): Payer: Medicare Other | Admitting: *Deleted

## 2016-04-18 DIAGNOSIS — I482 Chronic atrial fibrillation, unspecified: Secondary | ICD-10-CM

## 2016-04-18 DIAGNOSIS — I4891 Unspecified atrial fibrillation: Secondary | ICD-10-CM

## 2016-04-18 DIAGNOSIS — Z5181 Encounter for therapeutic drug level monitoring: Secondary | ICD-10-CM | POA: Diagnosis not present

## 2016-04-18 LAB — POCT INR: INR: 2.1

## 2016-05-06 ENCOUNTER — Encounter: Payer: Self-pay | Admitting: Nurse Practitioner

## 2016-05-06 ENCOUNTER — Ambulatory Visit (INDEPENDENT_AMBULATORY_CARE_PROVIDER_SITE_OTHER): Payer: Medicare Other | Admitting: *Deleted

## 2016-05-06 ENCOUNTER — Ambulatory Visit (INDEPENDENT_AMBULATORY_CARE_PROVIDER_SITE_OTHER): Payer: Medicare Other | Admitting: Nurse Practitioner

## 2016-05-06 VITALS — BP 140/82 | HR 69 | Ht 63.5 in | Wt 183.0 lb

## 2016-05-06 DIAGNOSIS — Z5181 Encounter for therapeutic drug level monitoring: Secondary | ICD-10-CM

## 2016-05-06 DIAGNOSIS — I482 Chronic atrial fibrillation, unspecified: Secondary | ICD-10-CM

## 2016-05-06 DIAGNOSIS — I4891 Unspecified atrial fibrillation: Secondary | ICD-10-CM

## 2016-05-06 LAB — BASIC METABOLIC PANEL
BUN: 14 mg/dL (ref 7–25)
CO2: 27 mmol/L (ref 20–31)
Calcium: 9.1 mg/dL (ref 8.6–10.4)
Chloride: 106 mmol/L (ref 98–110)
Creat: 1.18 mg/dL — ABNORMAL HIGH (ref 0.60–0.93)
Glucose, Bld: 81 mg/dL (ref 65–99)
Potassium: 4.1 mmol/L (ref 3.5–5.3)
Sodium: 142 mmol/L (ref 135–146)

## 2016-05-06 LAB — POCT INR: INR: 2

## 2016-05-06 LAB — CBC
HCT: 41 % (ref 35.0–45.0)
Hemoglobin: 13.7 g/dL (ref 11.7–15.5)
MCH: 32.9 pg (ref 27.0–33.0)
MCHC: 33.4 g/dL (ref 32.0–36.0)
MCV: 98.3 fL (ref 80.0–100.0)
MPV: 9.9 fL (ref 7.5–12.5)
Platelets: 193 10*3/uL (ref 140–400)
RBC: 4.17 MIL/uL (ref 3.80–5.10)
RDW: 13.6 % (ref 11.0–15.0)
WBC: 5.4 10*3/uL (ref 3.8–10.8)

## 2016-05-06 NOTE — Progress Notes (Signed)
CARDIOLOGY OFFICE NOTE  Date:  05/06/2016    Joanna RifflePatricia A [name] Date of Birth: 09-Apr-1941 Medical Record #[id number]  PCP:  Joanna AlkenBARNES,Joanna STEWART, MD  Cardiologist:  Former patient of Dr. Yevonne [name]'s  - establishing with me going forward  Chief Complaint  Patient presents with  . Atrial Fibrillation  . Hypertension  . Congestive Heart Failure    6 month check - former patient of Dr. Yevonne [name]'s    History of Present Illness: Joanna [name] is a 75 y.o. female who presents today for a 6 month check. Former patient of Dr. Yevonne [name]'s.   She is a former patient of Dr. Peter [name]. She did not wish to transition to the [employer] [facility]. She has a past history of chronic permanent atrial fibrillation. She is a long-term warfarin. She's had a prior history of a middle cerebral artery CVA in 2012. She retired from work after that stroke. 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 Other issues include HTN, prior LV dysfunction.   Last seen back in November by me. I had seen her in October - she had had some heart failure symptoms but was improved on her follow up and updated her echo.   Admitted back in December with recurrent stroke.  Discharge summary reviewed - apparently did not wish to switch to DOAC. She remains on coumadin. Echo was updated again. Apparently very resistant to allowing family support.   Comes back today. Here with her daughter. She says she is doing ok. Denies having a stroke back in December to me. Seems a little confused. Says she is taking her medicines. No chest pain. Breathing is ok. Her weight is down - not really clear as to why. Needs her handicap form filled out.   Past Medical History  Diagnosis Date  . Chronic atrial fibrillation (HCC)   . Ischemic bowel disease (HCC)     With GI bleeding  . Splenic infarct   . Left ventricular dysfunction     EF is now 50-55% as of 2012  . Stroke [facility](HCC)  February 2012    Now back on coumadin  . Diverticulosis   . Hypertension   . Obesity   . Aphasia 2012  . Dysnomia 12/2010  . Chronic kidney disease   . Acute renal failure (HCC) 02/23/2007-03/22/2007    Now with normalization back to baseline    Past Surgical History  Procedure Laterality Date  . Ileostomy    . Colectomy  12/2010    partial  . Omentectomy      partial with ileostomy     Medications: Current Outpatient Prescriptions  Medication Sig Dispense Refill  . diltiazem (CARTIA XT) 120 MG 24 hr capsule Take 1 capsule (120 mg total) by mouth daily. Appointment needed for future refills 90 capsule 3  . furosemide (LASIX) 40 MG tablet Take 1 tablet (40 mg total) by mouth daily. 90 tablet 3  . metoprolol (LOPRESSOR) 100 MG tablet Take 1 tablet (100 mg total) by mouth 2 (two) times daily. 60 tablet 11  . warfarin (COUMADIN) 5 MG tablet Take 1 to 1.5 tablets by mouth daily as directed by coumadin clinic (Patient taking differently: Take 5-7.5 mg by mouth See admin instructions. Take 1 tablet by mouth daily except on Fridays take 1 and a half (7.5Mg )) 120 tablet 2   No current facility-administered medications for this visit.    Allergies: No Known Allergies  Social History: The  patient  reports that she has never smoked. She does not have any smokeless tobacco history on file. She reports that she does not drink alcohol.   Family History: [name] family history includes Heart disease in her mother; Hypertension in her sister.   Review of Systems: Please see the history of present illness.   Otherwise, the review of systems is positive for none.   All other systems are reviewed and negative.   Physical Exam: VS:  BP 140/82 mmHg  Pulse 69  Ht 5' 3.5" (1.613 m)  Wt 183 lb (83.008 kg)  BMI 31.90 kg/m2 .  BMI Body mass index is 31.9 kg/(m^2).  Wt Readings from Last 3 Encounters:  05/06/16 183 lb (83.008 kg)  10/26/15 193 lb 1.6 oz (87.59 kg)  09/17/15 197 lb 1.9 oz  (89.413 kg)    General: Pleasant. She seems a little confused about recent events but alert and in no acute distress. Her weight is down 14 pounds since her last visit here.  HEENT: Normal. Neck: Supple, no JVD, carotid bruits, or masses noted.  Cardiac: Irregular irregular rhythm. Rate ok. Heart tones are distant.  No edema.  Respiratory:  Lungs are clear to auscultation bilaterally with normal work of breathing.  GI: Soft and nontender.  MS: No deformity or atrophy. Gait and ROM intact. Skin: Warm and dry. Color is normal.  Neuro:  Strength and sensation are intact and no gross focal deficits noted.  Psych: Alert, appropriate and with normal affect.   LABORATORY DATA:  EKG:  EKG is ordered today. This demonstrates atrial fib with a controlled VR.  Lab Results  Component Value Date   WBC 5.2 10/27/2015   HGB 13.9 10/27/2015   HCT 42.8 10/27/2015   PLT 156 10/27/2015   GLUCOSE 92 10/27/2015   CHOL 117 10/27/2015   TRIG 122 10/27/2015   HDL 34* 10/27/2015   LDLCALC 59 10/27/2015   ALT 14 10/27/2015   AST 33 10/27/2015   NA 136 10/27/2015   K 3.7 10/27/2015   CL 102 10/27/2015   CREATININE 0.80 10/27/2015   BUN 9 10/27/2015   CO2 24 10/27/2015   TSH 2.781 12/26/2010   INR 2.1 04/18/2016   HGBA1C 6.6* 10/27/2015    BNP (last 3 results)  Recent Labs  09/01/15 1243  BNP 789.0*    ProBNP (last 3 results) No results for input(s): PROBNP in the last 8760 hours.   Other Studies Reviewed Today:  Echo Study Conclusions from 10/2015  - Left ventricle: The cavity size was normal. Systolic function was  normal. The estimated ejection fraction was in the range of 50%  to 55%. Wall motion was normal; there were no regional wall  motion abnormalities. - Aortic valve: Trileaflet; mildly thickened, mildly calcified  leaflets. - Mitral valve: Calcified annulus. There was mild to moderate  regurgitation. - Left atrium: The atrium was severely dilated. - Right  ventricle: The cavity size was mildly dilated. Wall  thickness was normal. Systolic function was mildly reduced. - Right atrium: The atrium was massively dilated. - Tricuspid valve: There was severe regurgitation. - Pulmonary arteries: PA peak pressure: 56 mm Hg (S).  Impressions:  - The right ventricular systolic pressure was increased consistent  with moderate pulmonary hypertension.  Assessment/Plan: 1. Permanent atrial fibrillation - managed with rate control and anticoagulation  2. Recurrent CVA - has refused switching to DOAC - this was discussed again today - she wishes to stay on coumadin. Checking labs today as  well.  3. HTN - BP fair.   4. Prior left ventricular dysfunction but more recent echo showing ejection fraction of 50%.  5. Chronic combined systolic and diastolic heart failure, improved on furosemide  6. Chronic anticoagulation  Current medicines are reviewed with the patient today.  The patient does not have concerns regarding medicines other than what has been noted above.  The following changes have been made:  See above.  Labs/ tests ordered today include:    Orders Placed This Encounter  Procedures  . Basic metabolic panel  . CBC  . EKG 12-Lead     Disposition:   FU with me in 6 months. Would favor continued supportive care.    Patient is agreeable to this plan and will call if any problems develop in the interim.   Signed: Rosalio MacadamiaLori C. Roxan Yamamoto, RN, ANP-C 05/06/2016 12:20 PM  [facility] [address] [place], [place]  [address] Phone: [phone] Fax: [phone]

## 2016-05-06 NOTE — Patient Instructions (Addendum)
We will be checking the following labs today - BMET and CBC  Coumadin check today   Medication Instructions:    Continue with your current medicines.     Testing/Procedures To Be Arranged:  N/A  Follow-Up:   See me in 6 months    Other Special Instructions:   N/A    If you need a refill on your cardiac medications before your next appointment, please call your pharmacy.   Call the [facility] office at [phone] if you have any questions, problems or concerns.

## 2016-05-31 ENCOUNTER — Ambulatory Visit (INDEPENDENT_AMBULATORY_CARE_PROVIDER_SITE_OTHER): Payer: Medicare Other | Admitting: *Deleted

## 2016-05-31 DIAGNOSIS — I4891 Unspecified atrial fibrillation: Secondary | ICD-10-CM

## 2016-05-31 DIAGNOSIS — Z5181 Encounter for therapeutic drug level monitoring: Secondary | ICD-10-CM

## 2016-05-31 DIAGNOSIS — I482 Chronic atrial fibrillation, unspecified: Secondary | ICD-10-CM

## 2016-05-31 LAB — POCT INR: INR: 2.6

## 2016-07-05 ENCOUNTER — Other Ambulatory Visit: Payer: Self-pay | Admitting: *Deleted

## 2016-07-05 MED ORDER — METOPROLOL TARTRATE 100 MG PO TABS: 100.0000 mg | ORAL_TABLET | Freq: Two times a day (BID) | ORAL | 9 refills | Status: DC

## 2016-07-06 ENCOUNTER — Ambulatory Visit (INDEPENDENT_AMBULATORY_CARE_PROVIDER_SITE_OTHER): Payer: Medicare Other | Admitting: *Deleted

## 2016-07-06 DIAGNOSIS — I4891 Unspecified atrial fibrillation: Secondary | ICD-10-CM

## 2016-07-06 DIAGNOSIS — Z5181 Encounter for therapeutic drug level monitoring: Secondary | ICD-10-CM | POA: Diagnosis not present

## 2016-07-06 LAB — POCT INR: INR: 2.8

## 2016-08-16 ENCOUNTER — Other Ambulatory Visit: Payer: Self-pay | Admitting: Nurse Practitioner

## 2016-08-17 ENCOUNTER — Other Ambulatory Visit: Payer: Self-pay | Admitting: Nurse Practitioner

## 2016-08-18 ENCOUNTER — Other Ambulatory Visit: Payer: Self-pay | Admitting: Nurse Practitioner

## 2016-08-25 ENCOUNTER — Ambulatory Visit (INDEPENDENT_AMBULATORY_CARE_PROVIDER_SITE_OTHER): Payer: Medicare Other

## 2016-08-25 DIAGNOSIS — I4891 Unspecified atrial fibrillation: Secondary | ICD-10-CM | POA: Diagnosis not present

## 2016-08-25 DIAGNOSIS — Z5181 Encounter for therapeutic drug level monitoring: Secondary | ICD-10-CM | POA: Diagnosis not present

## 2016-08-25 LAB — POCT INR: INR: 2.3

## 2016-09-27 ENCOUNTER — Other Ambulatory Visit: Payer: Self-pay | Admitting: Nurse Practitioner

## 2016-10-13 ENCOUNTER — Ambulatory Visit (INDEPENDENT_AMBULATORY_CARE_PROVIDER_SITE_OTHER): Payer: Medicare Other | Admitting: Pharmacist

## 2016-10-13 DIAGNOSIS — Z5181 Encounter for therapeutic drug level monitoring: Secondary | ICD-10-CM

## 2016-10-13 DIAGNOSIS — I4891 Unspecified atrial fibrillation: Secondary | ICD-10-CM | POA: Diagnosis not present

## 2016-10-13 LAB — POCT INR: INR: 2.7

## 2016-10-20 ENCOUNTER — Encounter: Payer: Self-pay | Admitting: Nurse Practitioner

## 2016-10-25 ENCOUNTER — Ambulatory Visit: Payer: Medicare Other | Admitting: Nurse Practitioner

## 2016-11-15 ENCOUNTER — Encounter: Payer: Self-pay | Admitting: Nurse Practitioner

## 2016-11-15 ENCOUNTER — Ambulatory Visit (INDEPENDENT_AMBULATORY_CARE_PROVIDER_SITE_OTHER): Payer: Medicare Other | Admitting: Nurse Practitioner

## 2016-11-15 ENCOUNTER — Encounter (INDEPENDENT_AMBULATORY_CARE_PROVIDER_SITE_OTHER): Payer: Self-pay

## 2016-11-15 VITALS — BP 126/78 | HR 69 | Ht 63.5 in | Wt 188.1 lb

## 2016-11-15 DIAGNOSIS — I482 Chronic atrial fibrillation, unspecified: Secondary | ICD-10-CM

## 2016-11-15 MED ORDER — FUROSEMIDE 40 MG PO TABS: 40.0000 mg | ORAL_TABLET | Freq: Every day | ORAL | 3 refills | Status: DC

## 2016-11-15 MED ORDER — DILTIAZEM HCL ER COATED BEADS 120 MG PO CP24: 120.0000 mg | ORAL_CAPSULE | Freq: Every day | ORAL | 3 refills | Status: DC

## 2016-11-15 NOTE — Progress Notes (Signed)
CARDIOLOGY OFFICE NOTE  Date:  11/15/2016    Joanna [name] Date of Birth: Apr 22, 1941 Medical Record #[id number]  PCP:  Gaye Alken, MD  Cardiologist:  Tyrone Sage    Chief Complaint  Patient presents with  . Atrial Fibrillation    6 month check.     History of Present Illness: Joanna [name] is a 76 y.o. female who presents today for a 6 month check. Former patient of Dr. Yevonne Pax [facility]. She now follows with me. Did not wish to transition to the [employer].   She has a past history of chronic permanent atrial fibrillation. She is a long-term warfarin. She's had a prior history of a middle cerebral artery CVA in 2012. She retired from work after that stroke. 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 Other issues include HTN, prior LV dysfunction.   Admitted back in December of 2016 with recurrent stroke.  Discharge summary reviewed - apparently did not wish to switch to DOAC. She remains on coumadin. Echo was updated again. Noted to be resistant to allowing family support.   I last saw her back in June - seemed to be doing ok. Little confused but cardiac status was ok.   Comes back today. Here alone today. Says she is doing ok. No chest pain. Breathing ok. No swelling. No falls. Tolerating her medicines. Needs to make an appointment to see Dr. Zachery Dauer but otherwise doing ok.   Past Medical History:  Diagnosis Date  . Acute renal failure (HCC) 02/23/2007-03/22/2007   Now with normalization back to baseline  . Aphasia 2012  . Chronic atrial fibrillation (HCC)   . Chronic kidney disease   . Diverticulosis   . Dysnomia 12/2010  . Hypertension   . Ischemic bowel disease (HCC)    With GI bleeding  . Left ventricular dysfunction    EF is now 50-55% as of 2012  . Obesity   . Splenic infarct   . Stroke [facility]) February 2012   Now back on coumadin    Past Surgical History:  Procedure Laterality  Date  . COLECTOMY  12/2010   partial  . ILEOSTOMY    . OMENTECTOMY     partial with ileostomy     Medications: Current Outpatient Prescriptions  Medication Sig Dispense Refill  . diltiazem (CARTIA XT) 120 MG 24 hr capsule Take 1 capsule (120 mg total) by mouth daily. 90 capsule 3  . furosemide (LASIX) 40 MG tablet Take 1 tablet (40 mg total) by mouth daily. 90 tablet 3  . metoprolol (LOPRESSOR) 100 MG tablet Take 1 tablet (100 mg total) by mouth 2 (two) times daily. 60 tablet 9  . warfarin (COUMADIN) 5 MG tablet Take as directed by Coumadin Clinic 100 tablet 1   No current facility-administered medications for this visit.     Allergies: No Known Allergies  Social History: The patient  reports that she has never smoked. She has never used smokeless tobacco. She reports that she does not drink alcohol or use drugs.   Family History: [name] family history includes Heart disease in her mother; Hypertension in her sister.   Review of Systems: Please see the history of present illness.   Otherwise, the review of systems is positive for none.   All other systems are reviewed and negative.   Physical Exam: VS:  BP 126/78   Pulse 69   Ht 5' 3.5" (1.613 m)  Wt 188 lb 1.9 oz (85.3 kg)   BMI 32.80 kg/m  .  BMI Body mass index is 32.8 kg/m.  Wt Readings from Last 3 Encounters:  11/15/16 188 lb 1.9 oz (85.3 kg)  05/06/16 183 lb (83 kg)  10/26/15 193 lb 1.6 oz (87.6 kg)    General: Pleasant. Elderly female who is alert and in no acute distress.   HEENT: Normal.  Neck: Supple, no JVD, carotid bruits, or masses noted.  Cardiac: Irregular irregular rhythm.  Her rate is fine. No murmurs, rubs, or gallops. No edema.  Respiratory:  Lungs are clear to auscultation bilaterally with normal work of breathing.  GI: Soft and nontender.  MS: No deformity or atrophy. Gait and ROM intact.  Skin: Warm and dry. Color is normal.  Neuro:  Strength and sensation are intact and no gross  focal deficits noted.  Psych: Alert, appropriate and with normal affect.   LABORATORY DATA:  EKG:  EKG is ordered today. This demonstrates AF with a controlled VR.  Lab Results  Component Value Date   WBC 5.4 05/06/2016   HGB 13.7 05/06/2016   HCT 41.0 05/06/2016   PLT 193 05/06/2016   GLUCOSE 81 05/06/2016   CHOL 117 10/27/2015   TRIG 122 10/27/2015   HDL 34 (L) 10/27/2015   LDLCALC 59 10/27/2015   ALT 14 10/27/2015   AST 33 10/27/2015   NA 142 05/06/2016   K 4.1 05/06/2016   CL 106 05/06/2016   CREATININE 1.18 (H) 05/06/2016   BUN 14 05/06/2016   CO2 27 05/06/2016   TSH 2.781 12/26/2010   INR 2.7 10/13/2016   HGBA1C 6.6 (H) 10/27/2015   Lab Results  Component Value Date   INR 2.7 10/13/2016   INR 2.3 08/25/2016   INR 2.8 07/06/2016     BNP (last 3 results) No results for input(s): BNP in the last 8760 hours.  ProBNP (last 3 results) No results for input(s): PROBNP in the last 8760 hours.   Other Studies Reviewed Today:  Echo Study Conclusions from 10/2015  - Left ventricle: The cavity size was normal. Systolic function was  normal. The estimated ejection fraction was in the range of 50%  to 55%. Wall motion was normal; there were no regional wall  motion abnormalities. - Aortic valve: Trileaflet; mildly thickened, mildly calcified  leaflets. - Mitral valve: Calcified annulus. There was mild to moderate  regurgitation. - Left atrium: The atrium was severely dilated. - Right ventricle: The cavity size was mildly dilated. Wall  thickness was normal. Systolic function was mildly reduced. - Right atrium: The atrium was massively dilated. - Tricuspid valve: There was severe regurgitation. - Pulmonary arteries: PA peak pressure: 56 mm Hg (S).  Impressions:  - The right ventricular systolic pressure was increased consistent  with moderate pulmonary hypertension.  Assessment/Plan: 1. Permanent atrial fibrillation - managed with rate control  and anticoagulation  2. Prior CVA - has refused switching to DOAC -  she wishes to stay on coumadin. Checking labs today as well.  3. HTN - BP ok on her current regimen  4. Prior left ventricular dysfunction but more recent echo showing ejection fraction of 50%.  5. Chronic combined systolic and diastolic heart failure - looks good clinically  6. Chronic anticoagulation - no problems noted.   Current medicines are reviewed with the patient today.  The patient does not have concerns regarding medicines other than what has been noted above.  The following changes have been made:  See above.  Labs/ tests ordered today include:    Orders Placed This Encounter  Procedures  . Basic metabolic panel  . CBC  . EKG 12-Lead     Disposition:   FU with me in 6 months.   Patient is agreeable to this plan and will call if any problems develop in the interim.   Signed: Rosalio Macadamia, RN, ANP-C 11/15/2016 3:49 PM  [facility] [address] [place], [place]  [address] Phone: [phone] Fax: [phone]

## 2016-11-15 NOTE — Patient Instructions (Addendum)
We will be checking the following labs today - BMET and CBC   Medication Instructions:    Continue with your current medicines.      Testing/Procedures To Be Arranged:  N/A  Follow-Up:   See me in 6 months    Other Special Instructions:   N/A    If you need a refill on your cardiac medications before your next appointment, please call your pharmacy.   Call the [facility] office at [phone] if you have any questions, problems or concerns.      

## 2016-11-16 LAB — BASIC METABOLIC PANEL
BUN/Creatinine Ratio: 15 (ref 12–28)
BUN: 16 mg/dL (ref 8–27)
CO2: 23 mmol/L (ref 18–29)
Calcium: 9.1 mg/dL (ref 8.7–10.3)
Chloride: 105 mmol/L (ref 96–106)
Creatinine, Ser: 1.08 mg/dL — ABNORMAL HIGH (ref 0.57–1.00)
GFR calc Af Amer: 58 mL/min/{1.73_m2} — ABNORMAL LOW (ref >59)
GFR calc non Af Amer: 50 mL/min/{1.73_m2} — ABNORMAL LOW (ref >59)
Glucose: 87 mg/dL (ref 65–99)
Potassium: 4.3 mmol/L (ref 3.5–5.2)
Sodium: 144 mmol/L (ref 134–144)

## 2016-11-16 LAB — CBC
Hematocrit: 40.1 % (ref 34.0–46.6)
Hemoglobin: 13.5 g/dL (ref 11.1–15.9)
MCH: 33.1 pg — ABNORMAL HIGH (ref 26.6–33.0)
MCHC: 33.7 g/dL (ref 31.5–35.7)
MCV: 98 fL — ABNORMAL HIGH (ref 79–97)
Platelets: 204 10*3/uL (ref 150–379)
RBC: 4.08 x10E6/uL (ref 3.77–5.28)
RDW: 13.3 % (ref 12.3–15.4)
WBC: 5.8 10*3/uL (ref 3.4–10.8)

## 2016-11-27 ENCOUNTER — Observation Stay (HOSPITAL_COMMUNITY)
Admission: EM | Admit: 2016-11-27 | Discharge: 2016-11-29 | Disposition: A | Discharge status: Home / Self Care | Payer: Medicare Other | Attending: Internal Medicine | Admitting: Internal Medicine

## 2016-11-27 ENCOUNTER — Emergency Department (HOSPITAL_COMMUNITY): Payer: Medicare Other

## 2016-11-27 ENCOUNTER — Observation Stay (HOSPITAL_COMMUNITY): Payer: Medicare Other

## 2016-11-27 ENCOUNTER — Encounter (HOSPITAL_COMMUNITY): Payer: Self-pay | Admitting: Emergency Medicine

## 2016-11-27 DIAGNOSIS — S199XXA Unspecified injury of neck, initial encounter: Secondary | ICD-10-CM | POA: Diagnosis not present

## 2016-11-27 DIAGNOSIS — I6782 Cerebral ischemia: Secondary | ICD-10-CM | POA: Diagnosis not present

## 2016-11-27 DIAGNOSIS — R4182 Altered mental status, unspecified: Secondary | ICD-10-CM | POA: Diagnosis present

## 2016-11-27 DIAGNOSIS — I509 Heart failure, unspecified: Secondary | ICD-10-CM | POA: Insufficient documentation

## 2016-11-27 DIAGNOSIS — R4781 Slurred speech: Principal | ICD-10-CM | POA: Insufficient documentation

## 2016-11-27 DIAGNOSIS — R2990 Unspecified symptoms and signs involving the nervous system: Secondary | ICD-10-CM | POA: Diagnosis present

## 2016-11-27 DIAGNOSIS — I10 Essential (primary) hypertension: Secondary | ICD-10-CM | POA: Diagnosis present

## 2016-11-27 DIAGNOSIS — Z932 Ileostomy status: Secondary | ICD-10-CM | POA: Diagnosis not present

## 2016-11-27 DIAGNOSIS — I130 Hypertensive heart and chronic kidney disease with heart failure and stage 1 through stage 4 chronic kidney disease, or unspecified chronic kidney disease: Secondary | ICD-10-CM | POA: Insufficient documentation

## 2016-11-27 DIAGNOSIS — Z7901 Long term (current) use of anticoagulants: Secondary | ICD-10-CM | POA: Insufficient documentation

## 2016-11-27 DIAGNOSIS — R531 Weakness: Secondary | ICD-10-CM | POA: Insufficient documentation

## 2016-11-27 DIAGNOSIS — Z9049 Acquired absence of other specified parts of digestive tract: Secondary | ICD-10-CM | POA: Diagnosis not present

## 2016-11-27 DIAGNOSIS — W19XXXA Unspecified fall, initial encounter: Secondary | ICD-10-CM | POA: Insufficient documentation

## 2016-11-27 DIAGNOSIS — G459 Transient cerebral ischemic attack, unspecified: Secondary | ICD-10-CM

## 2016-11-27 DIAGNOSIS — M6281 Muscle weakness (generalized): Secondary | ICD-10-CM | POA: Diagnosis not present

## 2016-11-27 DIAGNOSIS — N189 Chronic kidney disease, unspecified: Secondary | ICD-10-CM | POA: Diagnosis not present

## 2016-11-27 DIAGNOSIS — I482 Chronic atrial fibrillation, unspecified: Secondary | ICD-10-CM | POA: Diagnosis present

## 2016-11-27 DIAGNOSIS — Z8673 Personal history of transient ischemic attack (TIA), and cerebral infarction without residual deficits: Secondary | ICD-10-CM | POA: Diagnosis not present

## 2016-11-27 DIAGNOSIS — R51 Headache: Secondary | ICD-10-CM | POA: Insufficient documentation

## 2016-11-27 DIAGNOSIS — R42 Dizziness and giddiness: Secondary | ICD-10-CM | POA: Diagnosis not present

## 2016-11-27 DIAGNOSIS — S0990XA Unspecified injury of head, initial encounter: Secondary | ICD-10-CM | POA: Diagnosis not present

## 2016-11-27 DIAGNOSIS — I6789 Other cerebrovascular disease: Secondary | ICD-10-CM | POA: Diagnosis not present

## 2016-11-27 HISTORY — DX: Heart failure, unspecified: I50.9

## 2016-11-27 LAB — CBC WITH DIFFERENTIAL/PLATELET
Basophils Absolute: 0 10*3/uL (ref 0.0–0.1)
Basophils Relative: 0 %
Eosinophils Absolute: 0 10*3/uL (ref 0.0–0.7)
Eosinophils Relative: 0 %
HEMATOCRIT: 40.5 % (ref 36.0–46.0)
HEMOGLOBIN: 13.4 g/dL (ref 12.0–15.0)
LYMPHS ABS: 1.5 10*3/uL (ref 0.7–4.0)
Lymphocytes Relative: 15 %
MCH: 32.8 pg (ref 26.0–34.0)
MCHC: 33.1 g/dL (ref 30.0–36.0)
MCV: 99.3 fL (ref 78.0–100.0)
MONOS PCT: 10 %
Monocytes Absolute: 1 10*3/uL (ref 0.1–1.0)
NEUTROS ABS: 7.5 10*3/uL (ref 1.7–7.7)
NEUTROS PCT: 75 %
Platelets: 168 10*3/uL (ref 150–400)
RBC: 4.08 MIL/uL (ref 3.87–5.11)
RDW: 13.7 % (ref 11.5–15.5)
WBC: 10.1 10*3/uL (ref 4.0–10.5)

## 2016-11-27 LAB — BASIC METABOLIC PANEL
Anion gap: 11 (ref 5–15)
BUN: 13 mg/dL (ref 6–20)
CO2: 21 mmol/L — AB (ref 22–32)
Calcium: 8.8 mg/dL — ABNORMAL LOW (ref 8.9–10.3)
Chloride: 105 mmol/L (ref 101–111)
Creatinine, Ser: 1.09 mg/dL — ABNORMAL HIGH (ref 0.44–1.00)
GFR, EST AFRICAN AMERICAN: 56 mL/min — AB (ref >60)
GFR, EST NON AFRICAN AMERICAN: 48 mL/min — AB (ref >60)
GLUCOSE: 104 mg/dL — AB (ref 65–99)
POTASSIUM: 4 mmol/L (ref 3.5–5.1)
Sodium: 137 mmol/L (ref 135–145)

## 2016-11-27 LAB — CBG MONITORING, ED: Glucose-Capillary: 110 mg/dL — ABNORMAL HIGH (ref 65–99)

## 2016-11-27 LAB — PROTIME-INR
INR: 1.91
Prothrombin Time: 22.2 seconds — ABNORMAL HIGH (ref 11.4–15.2)

## 2016-11-27 MED ORDER — STROKE: EARLY STAGES OF RECOVERY BOOK
Freq: Once | Status: AC
  Administered 2016-11-27: 1
  Filled 2016-11-27: qty 1

## 2016-11-27 MED ORDER — ACETAMINOPHEN 325 MG PO TABS
650.0000 mg | ORAL_TABLET | ORAL | Status: DC | PRN
  Administered 2016-11-29: 650 mg via ORAL
  Filled 2016-11-27: qty 2

## 2016-11-27 MED ORDER — DILTIAZEM HCL ER COATED BEADS 120 MG PO CP24
120.0000 mg | ORAL_CAPSULE | Freq: Every day | ORAL | Status: DC
  Administered 2016-11-28 – 2016-11-29 (×2): 120 mg via ORAL
  Filled 2016-11-27 (×2): qty 1

## 2016-11-27 MED ORDER — WARFARIN SODIUM 7.5 MG PO TABS
7.5000 mg | ORAL_TABLET | Freq: Once | ORAL | Status: AC
  Administered 2016-11-27: 7.5 mg via ORAL
  Filled 2016-11-27: qty 1

## 2016-11-27 MED ORDER — FUROSEMIDE 40 MG PO TABS
40.0000 mg | ORAL_TABLET | Freq: Every day | ORAL | Status: DC
  Administered 2016-11-28 – 2016-11-29 (×2): 40 mg via ORAL
  Filled 2016-11-27 (×2): qty 1

## 2016-11-27 MED ORDER — WARFARIN - PHARMACIST DOSING INPATIENT: Freq: Every day | Status: DC

## 2016-11-27 MED ORDER — ACETAMINOPHEN 650 MG RE SUPP: 650.0000 mg | RECTAL | Status: DC | PRN

## 2016-11-27 MED ORDER — ACETAMINOPHEN 160 MG/5ML PO SOLN: 650.0000 mg | ORAL | Status: DC | PRN

## 2016-11-27 MED ORDER — SODIUM CHLORIDE 0.9 % IV BOLUS (SEPSIS)
500.0000 mL | Freq: Once | INTRAVENOUS | Status: AC
Start: 2016-11-27 — End: 2016-11-27
  Administered 2016-11-27: 500 mL via INTRAVENOUS

## 2016-11-27 MED ORDER — METOPROLOL TARTRATE 100 MG PO TABS
100.0000 mg | ORAL_TABLET | Freq: Two times a day (BID) | ORAL | Status: DC
  Administered 2016-11-27 – 2016-11-29 (×4): 100 mg via ORAL
  Filled 2016-11-27 (×4): qty 1

## 2016-11-27 NOTE — ED Notes (Signed)
Back from ct.

## 2016-11-27 NOTE — ED Provider Notes (Signed)
[facility]-EMERGENCY DEPT Provider Note   CSN: [id number] Arrival date & time: 11/27/16  1738     History   Chief Complaint Chief Complaint  Patient presents with  . Altered Mental Status  . Fall    HPI Joanna [name] is a 76 y.o. female.  HPI   Pt with hx chronic afib on coumadin, CHF, CKD, stroke p/w fall after feeling off balance.  The fall was heard by son who had to assist her to the couch.  Pt usually is able to walk on her own.  Son states when he got to the house around 4pm the patient's speech was slower than normal and slightly slurred as it is now, off her baseline speech which was last witnessed as normal yesterday by her daughter.  Pt denies any symptoms at this time.  Had a headache earlier but states this has resolved.  Denies any pain from the fall.  Denies any new numbness or weakness, any recent illness.  Pt notes her right shoulder has been bothering her and this is why she seems weaker on the right, denies that this is new or different than usual.  Pt is on coumadin, states INR is very stable.    Past Medical History:  Diagnosis Date  . Acute renal failure (HCC) 02/23/2007-03/22/2007   Now with normalization back to baseline  . Aphasia 2012  . CHF (congestive heart failure) (HCC)   . Chronic atrial fibrillation (HCC)   . Chronic kidney disease   . Diverticulosis   . Dysnomia 12/2010  . Hypertension   . Ischemic bowel disease (HCC)    With GI bleeding  . Left ventricular dysfunction    EF is now 50-55% as of 2012  . Obesity   . Splenic infarct   . Stroke [facility](HCC) February 2012   Now back on coumadin    Patient Active Problem List   Diagnosis Date Noted  . Stroke-like symptoms 11/27/2016  . Stroke (cerebrum) (HCC) 10/27/2015  . CVA (cerebral infarction) 10/26/2015  . Acute ischemic left MCA stroke (HCC) 10/26/2015  . Chronic atrial fibrillation (HCC) 10/26/2015  . Encounter for therapeutic drug monitoring 01/03/2014  . Left ventricular dysfunction   .  Hypertension   . Atrial fibrillation (HCC) 02/04/2011  . Stroke [facility](HCC) 12/15/2010    Past Surgical History:  Procedure Laterality Date  . COLECTOMY  12/2010   partial  . ILEOSTOMY    . OMENTECTOMY     partial with ileostomy    OB History    No data available       Home Medications    Prior to Admission medications   Medication Sig Start Date End Date Taking? Authorizing Provider  diltiazem (CARTIA XT) 120 MG 24 hr capsule Take 1 capsule (120 mg total) by mouth daily. 11/15/16   Rosalio MacadamiaLori C Gerhardt, NP  furosemide (LASIX) 40 MG tablet Take 1 tablet (40 mg total) by mouth daily. 11/15/16   Rosalio MacadamiaLori C Gerhardt, NP  metoprolol (LOPRESSOR) 100 MG tablet Take 1 tablet (100 mg total) by mouth 2 (two) times daily. 07/05/16   Rosalio MacadamiaLori C Gerhardt, NP  warfarin (COUMADIN) 5 MG tablet Take as directed by [facility] 08/16/16   Peter M SwazilandJordan, MD    Family History Family History  Problem Relation Age of Onset  . Heart disease Mother   . Hypertension Sister     Social History Social History  Substance Use Topics  . Smoking status: Never Smoker  . Smokeless tobacco: Never Used  .  Alcohol use No     Allergies   Patient has no known allergies.   Review of Systems Review of Systems  All other systems reviewed and are negative.    Physical Exam Updated Vital Signs BP 124/91   Pulse 69   Temp 99.1 F (37.3 C)   Resp 26   Ht 5\' 3"  (1.6 m)   Wt 83 kg   SpO2 96%   BMI 32.42 kg/m   Physical Exam  Constitutional: She appears well-developed and well-nourished. No distress.  HENT:  Head: Normocephalic and atraumatic.  Neck: Neck supple.  Cardiovascular: Normal rate and regular rhythm.   Murmur heard. Pulmonary/Chest: Effort normal and breath sounds normal. No respiratory distress. She has no wheezes. She has no rales.  Abdominal: Soft. She exhibits no distension. There is no tenderness. There is no rebound and no guarding.  Neurological: She is alert.  Right pronator drift.   Speech slurred.  CN otherwise grossly intact.  Moves all extremities.  5/5 strength in left arm, 4/5 on right.  Question slight decrease in strength of right leg vs left.    Skin: She is not diaphoretic.  Nursing note and vitals reviewed.    ED Treatments / Results  Labs (all labs ordered are listed, but only abnormal results are displayed) Labs Reviewed  PROTIME-INR - Abnormal; Notable for the following:       Result Value   Prothrombin Time 22.2 (*)    All other components within normal limits  BASIC METABOLIC PANEL - Abnormal; Notable for the following:    CO2 21 (*)    Glucose, Bld 104 (*)    Creatinine, Ser 1.09 (*)    Calcium 8.8 (*)    GFR calc non Af Amer 48 (*)    GFR calc Af Amer 56 (*)    All other components within normal limits  CBG MONITORING, ED - Abnormal; Notable for the following:    Glucose-Capillary 110 (*)    All other components within normal limits  CBC WITH DIFFERENTIAL/PLATELET    EKG  EKG Interpretation  Date/Time:  Sunday November 27 2016 17:54:32 EST Ventricular Rate:  78 PR Interval:    QRS Duration: 101 QT Interval:  362 QTC Calculation: 413 R Axis:   46 Text Interpretation:  Atrial fibrillation Abnormal R-wave progression, early transition Borderline repolarization abnormality agree. no sig change from old Confirmed by Donnald Garre, MD, Lebron Conners [phone]) on 11/27/2016 6:41:53 PM       Radiology Ct Head Wo Contrast  Result Date: 11/27/2016 CLINICAL DATA:  PT SLIPPED OFF CHAIR AND HIT BACK OF HEAD ON EDGE OF ITPT STS NO PAIN IN HEAD OR NECK EXAM: CT HEAD WITHOUT CONTRAST CT CERVICAL SPINE WITHOUT CONTRAST TECHNIQUE: Multidetector CT imaging of the head and cervical spine was performed following the standard protocol without intravenous contrast. Multiplanar CT image reconstructions of the cervical spine were also generated. COMPARISON:  10/26/2015 FINDINGS: CT HEAD FINDINGS Brain: No evidence of acute infarction, hemorrhage, hydrocephalus, extra-axial  collection or mass lesion/mass effect. The ventricles and sulci are enlarged reflecting mild atrophy. There is an old subcortical lacune infarct in the lateral left frontal lobe. Vascular: No hyperdense vessel or unexpected calcification. Skull: Normal. Negative for fracture or focal lesion. Sinuses/Orbits: Globes and orbits are unremarkable. Visualized sinuses and mastoid air cells are clear. Other: None. CT CERVICAL SPINE FINDINGS Alignment: Normal. Skull base and vertebrae: No acute fracture. No primary bone lesion or focal pathologic process. Soft tissues and spinal canal: No prevertebral  fluid or swelling. No visible canal hematoma. Disc levels: Mild to moderate loss of disc height noted throughout the cervical spine, greatest at C6-C7. Mild spondylotic disc bulging with uncovertebral and endplate spurring. No convincing disc herniation. Upper chest: No acute finding. There is an apparent right subclavian artery passing posterior to the esophagus there are dense aortic and branch vessel calcifications including the carotid arteries. Other: None. IMPRESSION: HEAD CT:  No acute intracranial abnormalities.  No skull fracture. CERVICAL CT:  No fracture or acute finding. Electronically Signed   By: Amie Portland M.D.   On: 11/27/2016 19:47   Ct Cervical Spine Wo Contrast  Result Date: 11/27/2016 CLINICAL DATA:  PT SLIPPED OFF CHAIR AND HIT BACK OF HEAD ON EDGE OF ITPT STS NO PAIN IN HEAD OR NECK EXAM: CT HEAD WITHOUT CONTRAST CT CERVICAL SPINE WITHOUT CONTRAST TECHNIQUE: Multidetector CT imaging of the head and cervical spine was performed following the standard protocol without intravenous contrast. Multiplanar CT image reconstructions of the cervical spine were also generated. COMPARISON:  10/26/2015 FINDINGS: CT HEAD FINDINGS Brain: No evidence of acute infarction, hemorrhage, hydrocephalus, extra-axial collection or mass lesion/mass effect. The ventricles and sulci are enlarged reflecting mild atrophy. There  is an old subcortical lacune infarct in the lateral left frontal lobe. Vascular: No hyperdense vessel or unexpected calcification. Skull: Normal. Negative for fracture or focal lesion. Sinuses/Orbits: Globes and orbits are unremarkable. Visualized sinuses and mastoid air cells are clear. Other: None. CT CERVICAL SPINE FINDINGS Alignment: Normal. Skull base and vertebrae: No acute fracture. No primary bone lesion or focal pathologic process. Soft tissues and spinal canal: No prevertebral fluid or swelling. No visible canal hematoma. Disc levels: Mild to moderate loss of disc height noted throughout the cervical spine, greatest at C6-C7. Mild spondylotic disc bulging with uncovertebral and endplate spurring. No convincing disc herniation. Upper chest: No acute finding. There is an apparent right subclavian artery passing posterior to the esophagus there are dense aortic and branch vessel calcifications including the carotid arteries. Other: None. IMPRESSION: HEAD CT:  No acute intracranial abnormalities.  No skull fracture. CERVICAL CT:  No fracture or acute finding. Electronically Signed   By: Amie Portland M.D.   On: 11/27/2016 19:47    Procedures Procedures (including critical care time)  Medications Ordered in ED Medications  sodium chloride 0.9 % bolus 500 mL (500 mLs Intravenous New Bag/Given 11/27/16 1835)     Initial Impression / Assessment and Plan / ED Course  I have reviewed the triage vital signs and the nursing notes.  Pertinent labs & imaging results that were available during my care of the patient were reviewed by me and considered in my medical decision making (see chart for details).  Clinical Course as of Nov 27 2010  Wynelle Link Nov 27, 2016  1820 Pt seen and examined, discussed with Dr Donnald Garre who will also see the patient.    [EW]    Clinical Course User Index [EW] Trixie Dredge, PA-C    Afebrile nontoxic patient with hx chronic afib and stroke on coumadin p/w new slurred and slowed  speech (LSN yesterday) and new right arm weakness and pronator drift on exam.  Pt unable to tell me when the right arm symptoms started.  Pt also seen and examined by Dr Donnald Garre.  Initial workup reassuring but pt admitted to [facility] for r/o stroke.  Dr Julian Reil accepts pt.   Final Clinical Impressions(s) / ED Diagnoses   Final diagnoses:  Stroke-like symptoms  New Prescriptions New Prescriptions   No medications on file     Trixie Dredge, PA-C 11/27/16 2013    Arby Barrette, MD 11/29/16 [phone]

## 2016-11-27 NOTE — H&P (Signed)
History and Physical    Joanna [name] HYQ:[id number] DOB: Mar 24, 1941 DOA: 11/27/2016   PCP: Gaye AlkenBARNES,ELIZABETH STEWART, MD Chief Complaint:  Chief Complaint  Patient presents with  . Altered Mental Status  . Fall    HPI: Joanna [name] is a 76 y.o. female with medical history significant of chronic afib on coumadin, CHF, CKD, stroke p/w fall after feeling off balance.  The fall was heard by son who had to assist her to the couch.  Pt usually is able to walk on her own.  Son states when he got to the house around 4pm the patient's speech was slower than normal and slightly slurred as it is now, off her baseline speech which was last witnessed as normal yesterday by her daughter.  Pt denies any symptoms at this time.  Had a headache earlier but states this has resolved.  Denies any pain from the fall.  Denies any new numbness or weakness, any recent illness.  Pt notes her right shoulder has been bothering her and this is why she seems weaker on the right, denies that this is new or different than usual.  Pt is on coumadin, states INR is very stable.  ED Course: Patient states she is back to baseline and asymptomatic, family questions a slight speech change.  CT head neg, EKG shows rate controlled A.Fib.  Review of Systems: As per HPI otherwise 10 point review of systems negative.    Past Medical History:  Diagnosis Date  . Acute renal failure (HCC) 02/23/2007-03/22/2007   Now with normalization back to baseline  . Aphasia 2012  . CHF (congestive heart failure) (HCC)   . Chronic atrial fibrillation (HCC)   . Chronic kidney disease   . Diverticulosis   . Dysnomia 12/2010  . Hypertension   . Ischemic bowel disease (HCC)    With GI bleeding  . Left ventricular dysfunction    EF is now 50-55% as of 2012  . Obesity   . Splenic infarct   . Stroke [facility](HCC) February 2012   Now back on coumadin    Past Surgical History:  Procedure Laterality Date  . COLECTOMY  12/2010   partial  .  ILEOSTOMY    . OMENTECTOMY     partial with ileostomy     reports that she has never smoked. She has never used smokeless tobacco. She reports that she does not drink alcohol or use drugs.  No Known Allergies  Family History  Problem Relation Age of Onset  . Heart disease Mother   . Hypertension Sister       Prior to Admission medications   Medication Sig Start Date End Date Taking? Authorizing Provider  diltiazem (CARTIA XT) 120 MG 24 hr capsule Take 1 capsule (120 mg total) by mouth daily. 11/15/16   Rosalio MacadamiaLori C Gerhardt, NP  furosemide (LASIX) 40 MG tablet Take 1 tablet (40 mg total) by mouth daily. 11/15/16   Rosalio MacadamiaLori C Gerhardt, NP  metoprolol (LOPRESSOR) 100 MG tablet Take 1 tablet (100 mg total) by mouth 2 (two) times daily. 07/05/16   Rosalio MacadamiaLori C Gerhardt, NP  warfarin (COUMADIN) 5 MG tablet Take as directed by [facility] 08/16/16   Peter M SwazilandJordan, MD    Physical Exam: Vitals:   11/27/16 1845 11/27/16 1900 11/27/16 2000 11/27/16 2013  BP: 117/57 124/91 113/65   Pulse: 70 69 76   Resp: 21 26 23    Temp:    99.1 F (37.3 C)  SpO2: 95% 96% 98%  Weight:      Height:          Constitutional: NAD, calm, comfortable Eyes: PERRL, lids and conjunctivae normal ENMT: Mucous membranes are moist. Posterior pharynx clear of any exudate or lesions.Normal dentition.  Neck: normal, supple, no masses, no thyromegaly Respiratory: clear to auscultation bilaterally, no wheezing, no crackles. Normal respiratory effort. No accessory muscle use.  Cardiovascular: Regular rate and rhythm, no murmurs / rubs / gallops. No extremity edema. 2+ pedal pulses. No carotid bruits.  Abdomen: no tenderness, no masses palpated. No hepatosplenomegaly. Bowel sounds positive.  Musculoskeletal: no clubbing / cyanosis. No joint deformity upper and lower extremities. Good ROM, no contractures. Normal muscle tone.  Skin: no rashes, lesions, ulcers. No induration Neurologic: R pronator drift, 4/5 strength  RUE Psychiatric: Normal judgment and insight. Alert and oriented x 3. Normal mood.    Labs on Admission: I have personally reviewed following labs and imaging studies  CBC:  Recent Labs Lab 11/27/16 1755  WBC 10.1  NEUTROABS 7.5  HGB 13.4  HCT 40.5  MCV 99.3  PLT 168   Basic Metabolic Panel:  Recent Labs Lab 11/27/16 1755  NA 137  K 4.0  CL 105  CO2 21*  GLUCOSE 104*  BUN 13  CREATININE 1.09*  CALCIUM 8.8*   GFR: Estimated Creatinine Clearance: 44.8 mL/min (by C-G formula based on SCr of 1.09 mg/dL (H)). Liver Function Tests: No results for input(s): AST, ALT, ALKPHOS, BILITOT, PROT, ALBUMIN in the last 168 hours. No results for input(s): LIPASE, AMYLASE in the last 168 hours. No results for input(s): AMMONIA in the last 168 hours. Coagulation Profile:  Recent Labs Lab 11/27/16 1755  INR 1.91   Cardiac Enzymes: No results for input(s): CKTOTAL, CKMB, CKMBINDEX, TROPONINI in the last 168 hours. BNP (last 3 results) No results for input(s): PROBNP in the last 8760 hours. HbA1C: No results for input(s): HGBA1C in the last 72 hours. CBG:  Recent Labs Lab 11/27/16 1759  GLUCAP 110*   Lipid Profile: No results for input(s): CHOL, HDL, LDLCALC, TRIG, CHOLHDL, LDLDIRECT in the last 72 hours. Thyroid Function Tests: No results for input(s): TSH, T4TOTAL, FREET4, T3FREE, THYROIDAB in the last 72 hours. Anemia Panel: No results for input(s): VITAMINB12, FOLATE, FERRITIN, TIBC, IRON, RETICCTPCT in the last 72 hours. Urine analysis:    Component Value Date/Time   COLORURINE YELLOW 10/26/2015 2004   APPEARANCEUR CLEAR 10/26/2015 2004   LABSPEC 1.013 10/26/2015 2004   PHURINE 6.5 10/26/2015 2004   GLUCOSEU NEGATIVE 10/26/2015 2004   HGBUR TRACE (A) 10/26/2015 2004   BILIRUBINUR NEGATIVE 10/26/2015 2004   KETONESUR 15 (A) 10/26/2015 2004   PROTEINUR NEGATIVE 10/26/2015 2004   UROBILINOGEN 1.0 12/25/2010 2248   NITRITE NEGATIVE 10/26/2015 2004    LEUKOCYTESUR NEGATIVE 10/26/2015 2004   Sepsis Labs: @LABRCNTIP (procalcitonin:4,lacticidven:4) )No results found for this or any previous visit (from the past 240 hour(s)).   Radiological Exams on Admission: Ct Head Wo Contrast  Result Date: 11/27/2016 CLINICAL DATA:  PT SLIPPED OFF CHAIR AND HIT BACK OF HEAD ON EDGE OF ITPT STS NO PAIN IN HEAD OR NECK EXAM: CT HEAD WITHOUT CONTRAST CT CERVICAL SPINE WITHOUT CONTRAST TECHNIQUE: Multidetector CT imaging of the head and cervical spine was performed following the standard protocol without intravenous contrast. Multiplanar CT image reconstructions of the cervical spine were also generated. COMPARISON:  10/26/2015 FINDINGS: CT HEAD FINDINGS Brain: No evidence of acute infarction, hemorrhage, hydrocephalus, extra-axial collection or mass lesion/mass effect. The ventricles and sulci are enlarged  reflecting mild atrophy. There is an old subcortical lacune infarct in the lateral left frontal lobe. Vascular: No hyperdense vessel or unexpected calcification. Skull: Normal. Negative for fracture or focal lesion. Sinuses/Orbits: Globes and orbits are unremarkable. Visualized sinuses and mastoid air cells are clear. Other: None. CT CERVICAL SPINE FINDINGS Alignment: Normal. Skull base and vertebrae: No acute fracture. No primary bone lesion or focal pathologic process. Soft tissues and spinal canal: No prevertebral fluid or swelling. No visible canal hematoma. Disc levels: Mild to moderate loss of disc height noted throughout the cervical spine, greatest at C6-C7. Mild spondylotic disc bulging with uncovertebral and endplate spurring. No convincing disc herniation. Upper chest: No acute finding. There is an apparent right subclavian artery passing posterior to the esophagus there are dense aortic and branch vessel calcifications including the carotid arteries. Other: None. IMPRESSION: HEAD CT:  No acute intracranial abnormalities.  No skull fracture. CERVICAL CT:  No  fracture or acute finding. Electronically Signed   By: Amie Portland M.D.   On: 11/27/2016 19:47   Ct Cervical Spine Wo Contrast  Result Date: 11/27/2016 CLINICAL DATA:  PT SLIPPED OFF CHAIR AND HIT BACK OF HEAD ON EDGE OF ITPT STS NO PAIN IN HEAD OR NECK EXAM: CT HEAD WITHOUT CONTRAST CT CERVICAL SPINE WITHOUT CONTRAST TECHNIQUE: Multidetector CT imaging of the head and cervical spine was performed following the standard protocol without intravenous contrast. Multiplanar CT image reconstructions of the cervical spine were also generated. COMPARISON:  10/26/2015 FINDINGS: CT HEAD FINDINGS Brain: No evidence of acute infarction, hemorrhage, hydrocephalus, extra-axial collection or mass lesion/mass effect. The ventricles and sulci are enlarged reflecting mild atrophy. There is an old subcortical lacune infarct in the lateral left frontal lobe. Vascular: No hyperdense vessel or unexpected calcification. Skull: Normal. Negative for fracture or focal lesion. Sinuses/Orbits: Globes and orbits are unremarkable. Visualized sinuses and mastoid air cells are clear. Other: None. CT CERVICAL SPINE FINDINGS Alignment: Normal. Skull base and vertebrae: No acute fracture. No primary bone lesion or focal pathologic process. Soft tissues and spinal canal: No prevertebral fluid or swelling. No visible canal hematoma. Disc levels: Mild to moderate loss of disc height noted throughout the cervical spine, greatest at C6-C7. Mild spondylotic disc bulging with uncovertebral and endplate spurring. No convincing disc herniation. Upper chest: No acute finding. There is an apparent right subclavian artery passing posterior to the esophagus there are dense aortic and branch vessel calcifications including the carotid arteries. Other: None. IMPRESSION: HEAD CT:  No acute intracranial abnormalities.  No skull fracture. CERVICAL CT:  No fracture or acute finding. Electronically Signed   By: Amie Portland M.D.   On: 11/27/2016 19:47    EKG:  Independently reviewed.  Assessment/Plan Principal Problem:   Stroke-like symptoms Active Problems:   Hypertension   Chronic atrial fibrillation (HCC)    1. Stroke-like symptoms - either very small stroke vs TIA, certainly patient is very close to baseline if not at baseline 1. Stroke pathway 2. MRI/MRA 3. Lipid profile and A1C 4. Tele monitor 5. PT/OT/SLP 6. 2d echo 7. Carotid dopplers 8. Consult neurology if any findings on work up. 2. A.Fib - 1. Continue rate control 2. Continue coumadin 3. HTN - continue home meds   DVT prophylaxis: coumadin Code Status: full Family Communication: Daughter at bedside Consults called: None Admission status: Place in [place], Heywood Iles. DO [facility] Pager [phone] from 7PM-7AM  If 7AM-7PM, please contact the day physician for the patient [email or web address] Password [email or web address]  11/27/2016, 8:29 PM

## 2016-11-27 NOTE — Progress Notes (Signed)
Patient admitted to room 5M10 from E.D. Accompanied by daughter. Patient alert and oriented x4 Denies any pain or discomfort.  Tele initiated and verified. Oriented to room and unit Made comfortable call light in reach

## 2016-11-27 NOTE — ED Notes (Signed)
Pt to MRI then will be transported to 5 M

## 2016-11-27 NOTE — ED Triage Notes (Signed)
Pt BIB EMS from home where pt was discovered by family at [id number] with stroke-like symptoms, left side weakness, slurred speech, HA; pt had unwitnessed fall, pt denies pain. LSN Friday. Pt A&Ox4; resp e/u; NAD noted at this time.

## 2016-11-27 NOTE — Progress Notes (Signed)
ANTICOAGULATION CONSULT NOTE - Initial Consult  Pharmacy Consult for warfarin Indication: atrial fibrillation  No Known Allergies  Patient Measurements: Height: 5\' 3"  (160 cm) Weight: 183 lb (83 kg) IBW/kg (Calculated) : 52.4  Vital Signs: Temp: 99.1 F (37.3 C) (01/14 1749) BP: 124/91 (01/14 1900) Pulse Rate: 69 (01/14 1900)  Labs:  Recent Labs  11/27/16 1755  HGB 13.4  HCT 40.5  PLT 168  LABPROT 22.2*  INR 1.91  CREATININE 1.09*    Estimated Creatinine Clearance: 44.8 mL/min (by C-G formula based on SCr of 1.09 mg/dL (H)).   Medical History: Past Medical History:  Diagnosis Date  . Acute renal failure (HCC) 02/23/2007-03/22/2007   Now with normalization back to baseline  . Aphasia 2012  . CHF (congestive heart failure) (HCC)   . Chronic atrial fibrillation (HCC)   . Chronic kidney disease   . Diverticulosis   . Dysnomia 12/2010  . Hypertension   . Ischemic bowel disease (HCC)    With GI bleeding  . Left ventricular dysfunction    EF is now 50-55% as of 2012  . Obesity   . Splenic infarct   . Stroke [facility](HCC) February 2012   Now back on coumadin     Assessment: 76 yo F admitted 11/27/2016  with fall after feeling off balance, CT showing no acute abnormalities. Her son also states she has been having slower speech and it is now slurred. Patient on warfarin PTA for atrial fibrillation. Last dose reported as last night 1/13.   INR 1.91 (slightly subtherapeutic), Hgb stable 13.4, Plt wnl, No signs/symptoms of bleeding noted.  PTA warfarin regimen per clinic note on 09/2016: 5 mg daily, except 7.5 mg on Friday  Goal of Therapy:  INR 2-3 Monitor platelets by anticoagulation protocol: Yes   Plan:  - Warfarin 7.5 mg x1 tonight - Monitor daily INR, CBC and signs/symptoms of bleeding  Casilda Carlsaylor Lucero Ide, PharmD, BCPS PGY-2 Infectious Diseases Pharmacy Resident Pager: [phone] 11/27/2016,8:12 PM

## 2016-11-28 ENCOUNTER — Observation Stay (HOSPITAL_BASED_OUTPATIENT_CLINIC_OR_DEPARTMENT_OTHER): Payer: Medicare Other

## 2016-11-28 DIAGNOSIS — R2990 Unspecified symptoms and signs involving the nervous system: Secondary | ICD-10-CM

## 2016-11-28 DIAGNOSIS — I482 Chronic atrial fibrillation: Secondary | ICD-10-CM | POA: Diagnosis not present

## 2016-11-28 DIAGNOSIS — G459 Transient cerebral ischemic attack, unspecified: Secondary | ICD-10-CM

## 2016-11-28 DIAGNOSIS — I10 Essential (primary) hypertension: Secondary | ICD-10-CM

## 2016-11-28 LAB — URINALYSIS, MICROSCOPIC (REFLEX)

## 2016-11-28 LAB — URINALYSIS, ROUTINE W REFLEX MICROSCOPIC
BILIRUBIN URINE: NEGATIVE
GLUCOSE, UA: NEGATIVE mg/dL
Ketones, ur: NEGATIVE mg/dL
Nitrite: NEGATIVE
PH: 6 (ref 5.0–8.0)
Protein, ur: NEGATIVE mg/dL
SPECIFIC GRAVITY, URINE: 1.015 (ref 1.005–1.030)

## 2016-11-28 LAB — LIPID PANEL
Cholesterol: 120 mg/dL (ref 0–200)
HDL: 46 mg/dL (ref >40)
LDL CALC: 64 mg/dL (ref 0–99)
Total CHOL/HDL Ratio: 2.6 RATIO
Triglycerides: 51 mg/dL (ref <150)
VLDL: 10 mg/dL (ref 0–40)

## 2016-11-28 LAB — VAS US CAROTID
LCCADDIAS: -18 cm/s
LCCADSYS: -65 cm/s
LCCAPSYS: 94 cm/s
LEFT ECA DIAS: -7 cm/s
LEFT VERTEBRAL DIAS: 17 cm/s
LICADSYS: -89 cm/s
Left CCA prox dias: 16 cm/s
Left ICA dist dias: -19 cm/s
Left ICA prox dias: 13 cm/s
Left ICA prox sys: 69 cm/s
RCCADSYS: -98 cm/s
RCCAPSYS: 71 cm/s
RIGHT ECA DIAS: 10 cm/s
RIGHT VERTEBRAL DIAS: 7 cm/s
Right CCA prox dias: 6 cm/s

## 2016-11-28 LAB — CBC
HCT: 42 % (ref 36.0–46.0)
Hemoglobin: 13.5 g/dL (ref 12.0–15.0)
MCH: 32.5 pg (ref 26.0–34.0)
MCHC: 32.1 g/dL (ref 30.0–36.0)
MCV: 101.2 fL — ABNORMAL HIGH (ref 78.0–100.0)
PLATELETS: 145 10*3/uL — AB (ref 150–400)
RBC: 4.15 MIL/uL (ref 3.87–5.11)
RDW: 13.6 % (ref 11.5–15.5)
WBC: 9.1 10*3/uL (ref 4.0–10.5)

## 2016-11-28 LAB — ECHOCARDIOGRAM COMPLETE
HEIGHTINCHES: 63 in
WEIGHTICAEL: 2899.49 [oz_av]

## 2016-11-28 LAB — PROTIME-INR
INR: 1.82
PROTHROMBIN TIME: 21.3 s — AB (ref 11.4–15.2)

## 2016-11-28 MED ORDER — WARFARIN SODIUM 7.5 MG PO TABS
7.5000 mg | ORAL_TABLET | Freq: Once | ORAL | Status: AC
  Administered 2016-11-28: 7.5 mg via ORAL
  Filled 2016-11-28: qty 1

## 2016-11-28 NOTE — Evaluation (Addendum)
Occupational Therapy Evaluation Patient Details Name: Joanna [name] MRN: [id number] DOB: 1941-04-13 Today's Date: 11/28/2016    History of Present Illness Pt is a 76 y.o. female who presented to the ED following a fall due to feeling weak. She also presented with slow and slurred speech. She denies new numbness or weakness but has R swhoulder pain and weakness at baseline. CT negative and MRI revealing no acute abnormality but old L corona radiata infarct and chronic microvascular ischemia. She has a PMH significant for chronic afib on coumadin, CHF, CKD, stroke, dysnomia, hypertension, ischemic bowel disease, acute renal failure, aphasia, left ventricular dysfunction, and obesity.   Clinical Impression   PTA, pt was independent with basic ADL and had intermittent assistance with home management from her son. She currently presents with decreased ability to sustain focused attention with environmental distractions and requiring increased processing time during ADL. However, she was able to sequence simple ADL tasks and functioned at an overall supervision level with ADL. She reports that her family may be able to stay with her during recovery and feel that 24 hour supervision/assistance would be beneficial post-acute D/C. OT will continue to follow acutely in order to improve independence and safety with ADL.     Follow Up Recommendations  No OT follow up;Supervision/Assistance - 24 hour    Equipment Recommendations  Tub/shower seat;3 in 1 bedside commode    Recommendations for Other Services       Precautions / Restrictions Precautions Precautions: Fall      Mobility Bed Mobility Overal bed mobility: Needs Assistance Bed Mobility: Supine to Sit     Supine to sit: Supervision;HOB elevated     General bed mobility comments: Increased time and supervision for safety.  Transfers Overall transfer level: Needs assistance Equipment used: None Transfers: Sit to/from Stand Sit to  Stand: Supervision;Min guard         General transfer comment: Min guard for safety initially but able to progress to supervision level.    Balance Overall balance assessment: Needs assistance Sitting-balance support: No upper extremity supported;Feet supported Sitting balance-Leahy Scale: Good     Standing balance support: No upper extremity supported;During functional activity Standing balance-Leahy Scale: Fair                              ADL Overall ADL's : Needs assistance/impaired     Grooming: Supervision/safety;Set up;Oral care;Standing   Upper Body Bathing: Set up;Sitting   Lower Body Bathing: Min guard;Sit to/from stand   Upper Body Dressing : Set up;Sitting   Lower Body Dressing: Sit to/from stand;Supervision/safety;Set up   Toilet Transfer: Min guard;Ambulation   Toileting- Clothing Manipulation and Hygiene: Supervision/safety       Functional mobility during ADLs: Supervision/safety;Min guard General ADL Comments: Pt ambulating with supervision mostly but required min guard at times for safety. Pt able to sequence simple ADL tasks but easily distracted by environmental sounds throughout. Educated on fall prevention techqniques.     Vision Vision Assessment?: No apparent visual deficits   Perception     Praxis      Pertinent Vitals/Pain Pain Assessment: No/denies pain     Hand Dominance Right   Extremity/Trunk Assessment Upper Extremity Assessment Upper Extremity Assessment: RUE deficits/detail;Generalized weakness RUE Deficits / Details: Decreased R shoulder AROM which pt reports is baseline (approximately 0-100 degrees).   Lower Extremity Assessment Lower Extremity Assessment: Defer to PT evaluation       Communication Communication  Communication: Expressive difficulties (Slightly slurred and some slowed word finding.)   Cognition Arousal/Alertness: Awake/alert Behavior During Therapy: WFL for tasks  assessed/performed Overall Cognitive Status: No family/caregiver present to determine baseline cognitive functioning Area of Impairment: Attention;Awareness;Problem solving   Current Attention Level: Sustained       Awareness: Emergent Problem Solving: Slow processing;Difficulty sequencing;Requires verbal cues General Comments: No family available but noted pt requiring increased time for problem solving. She was also easily distracted by environmental noises. Speech was slow at times and pt seemed to demonstrate difficulty word finding.   General Comments       Exercises       Shoulder Instructions      Home Living Family/patient expects to be discharged to:: Private residence Living Arrangements: Alone Available Help at Discharge: Family;Available PRN/intermittently Type of Home: House             Bathroom Shower/Tub: Tub/shower unit Shower/tub characteristics: Curtain FirefighterBathroom Toilet: Handicapped height     Home Equipment: None          Prior Functioning/Environment Level of Independence: Independent        Comments: Driving        OT Problem List: Decreased strength;Decreased range of motion;Decreased activity tolerance;Impaired balance (sitting and/or standing);Decreased safety awareness;Decreased knowledge of use of DME or AE;Decreased knowledge of precautions;Pain;Impaired UE functional use   OT Treatment/Interventions: Self-care/ADL training;Therapeutic exercise;Energy conservation;Therapeutic activities;Patient/family education;Balance training;Cognitive remediation/compensation    OT Goals(Current goals can be found in the care plan section) Acute Rehab OT Goals Patient Stated Goal: to go home and be independent OT Goal Formulation: With patient Time For Goal Achievement: 12/05/16 Potential to Achieve Goals: Good ADL Goals Pt Will Perform Upper Body Bathing: with modified independence;sitting Pt Will Perform Lower Body Bathing: with modified  independence;sit to/from stand Pt Will Transfer to Toilet: with modified independence;ambulating;bedside commode (over toilet) Pt Will Perform Toileting - Clothing Manipulation and hygiene: with modified independence;sit to/from stand Pt Will Perform Tub/Shower Transfer: with modified independence;shower seat;ambulating;3 in 1;Tub transfer  OT Frequency: Min 2X/week   Barriers to D/C:            Co-evaluation              End of Session Equipment Utilized During Treatment: Gait belt Nurse Communication: Mobility status  Activity Tolerance: Patient tolerated treatment well Patient left: in chair;with call bell/phone within reach;with chair alarm set   Time: 0810-0855 OT Time Calculation (min): 45 min Charges:  OT General Charges $OT Visit: 1 Procedure OT Evaluation $OT Eval Moderate Complexity: 1 Procedure OT Treatments $Self Care/Home Management : 23-37 mins G-Codes: OT G-codes **NOT FOR INPATIENT CLASS** Functional Assessment Tool Used: clinical judgement Functional Limitation: Self care Self Care Current Status (Z6109(G8987): At least 1 percent but less than 20 percent impaired, limited or restricted Self Care Goal Status (U0454(G8988): At least 1 percent but less than 20 percent impaired, limited or restricted  Doristine SectionCharity A Yancey Pedley, OTR/L [phone] 11/28/2016, 10:39 AM

## 2016-11-28 NOTE — Progress Notes (Signed)
  Echocardiogram 2D Echocardiogram has been performed.  Joanna [name] 11/28/2016, 10:20 AM

## 2016-11-28 NOTE — Progress Notes (Signed)
*  PRELIMINARY RESULTS* Vascular Ultrasound Carotid Duplex has been completed.  Preliminary findings: Bilateral: No significant (1-39%) ICA stenosis. Antegrade vertebral flow.   Farrel DemarkJill Eunice, RDMS, RVT  11/28/2016, 9:29 AM

## 2016-11-28 NOTE — Care Management Note (Signed)
Case Management Note  Patient Details  Name: Joanna [name] MRN: [id number] Date of Birth: 16-Oct-1941  Subjective/Objective:              Patient presented with altered mental status. Lives at home alone.  CM will follow for discharge needs pending PT/OT evals and physician orders.      Action/Plan:   Expected Discharge Date:                  Expected Discharge Plan:     In-House Referral:     Discharge planning Services     Post Acute Care Choice:    Choice offered to:     DME Arranged:    DME Agency:     HH Arranged:    HH Agency:     Status of Service:     If discussed at [employer] Length of Stay Meetings, dates discussed:    Additional Comments:  Anda KraftRobarge, Darnisha Vernet C, RN 11/28/2016, 11:32 AM

## 2016-11-28 NOTE — Evaluation (Signed)
Speech Language Pathology Evaluation Patient Details Name: Joanna [name] MRN: [id number] DOB: October 05, 1941 Today's Date: 11/28/2016 Time: 4098-1191 SLP Time Calculation (min) (ACUTE ONLY): 27 min  Problem List:  Patient Active Problem List   Diagnosis Date Noted  . Stroke-like symptoms 11/27/2016  . Stroke (cerebrum) (HCC) 10/27/2015  . CVA (cerebral infarction) 10/26/2015  . Acute ischemic left MCA stroke (HCC) 10/26/2015  . Chronic atrial fibrillation (HCC) 10/26/2015  . Encounter for therapeutic drug monitoring 01/03/2014  . Left ventricular dysfunction   . Hypertension   . Atrial fibrillation (HCC) 02/04/2011  . Stroke [facility]) 12/15/2010   Past Medical History:  Past Medical History:  Diagnosis Date  . Acute renal failure (HCC) 02/23/2007-03/22/2007   Now with normalization back to baseline  . Aphasia 2012  . CHF (congestive heart failure) (HCC)   . Chronic atrial fibrillation (HCC)   . Chronic kidney disease   . Diverticulosis   . Dysnomia 12/2010  . Hypertension   . Ischemic bowel disease (HCC)    With GI bleeding  . Left ventricular dysfunction    EF is now 50-55% as of 2012  . Obesity   . Splenic infarct   . Stroke [facility]) February 2012   Now back on coumadin   Past Surgical History:  Past Surgical History:  Procedure Laterality Date  . COLECTOMY  12/2010   partial  . ILEOSTOMY    . OMENTECTOMY     partial with ileostomy   HPI:  Joanna [name] a 76 y.o.femalewith medical history significant of chronic afib on coumadin, CHF, CKD, stroke p/w fall after feeling off balance. The fall was heard by son who had to assist her to the couch. Pt usually is able to walk on her own. Son states when he got to the house around 4pm the patient's speech was slower than normal and slightly slurred as it is now, off her baseline speech which was last witnessed as normal yesterday by her daughter. Pt denies any symptoms at this time. Had a headache earlier but states this  has resolved. Denies any pain from the fall. Denies any new numbness or weakness, any recent illness. Pt notes her right shoulder has been bothering her and this is why she seems weaker on the right, denies that this is new or different than usual. Pt is on coumadin, states INR is very stable. Patient states she is back to baseline and asymptomatic, family questions a slight speech change. CT head neg, EKG shows rate controlled A.Fib. Recent MRI, revealed chronic microvascular ischemia and old left corona radiata infarct with no acute intracranial abnormality. Of note, pt with PMH of aphasia from CVA in 2012.    Assessment / Plan / Recommendation Clinical Impression  Pt appears to doemonstrate mild cognitive linguistic impairements c/b mild deficits in sustained attention and more than a reasonable amount of time when problem solving more complex situations. Pt obtained a score of 25 out of 30 points on the [facility] version 7.1 with average score of > 26 considered normal. Pt didn't exhibit any s/s of asphasia or dysarthria during conversation. Pt would benefit from increased supervision within the home setting upon d/c from acute setting to accomodate for her decreased sustained attention and problem solving. No further ST recommended in this setting.      SLP Assessment  Patient does not need any further Speech Lanaguage Pathology Services    Follow Up Recommendations  None          SLP Evaluation  Cognition  Overall Cognitive Status: No family/caregiver present to determine baseline cognitive functioning Arousal/Alertness: Awake/alert Orientation Level: Oriented X4 Attention: Sustained Sustained Attention: Impaired Sustained Attention Impairment: Verbal complex;Functional complex Selective Attention: Impaired Selective Attention Impairment: Verbal complex;Functional complex Memory: Impaired Memory Impairment: Decreased recall of new information Awareness: Appears intact Problem Solving:  Impaired Problem Solving Impairment: Verbal complex;Functional complex Safety/Judgment: Appears intact       Comprehension  Auditory Comprehension Overall Auditory Comprehension: Appears within functional limits for tasks assessed Yes/No Questions: Within Functional Limits Commands: Within Functional Limits Conversation: Simple Interfering Components: Attention EffectiveTechniques: Extra processing time;Repetition Visual Recognition/Discrimination Discrimination: Not tested Reading Comprehension Reading Status: Within funtional limits    Expression Expression Primary Mode of Expression: Verbal Verbal Expression Overall Verbal Expression: Appears within functional limits for tasks assessed Initiation: No impairment Level of Generative/Spontaneous Verbalization: Conversation Repetition: No impairment Naming: No impairment (Semantic paraphasia x 1, previous hx CVA) Pragmatics: No impairment Non-Verbal Means of Communication: Not applicable Written Expression Dominant Hand: Right Written Expression: Within Functional Limits   Oral / Motor  Oral Motor/Sensory Function Overall Oral Motor/Sensory Function: Within functional limits Motor Speech Overall Motor Speech: Appears within functional limits for tasks assessed Respiration: Within functional limits Phonation: Normal Resonance: Within functional limits Articulation: Within functional limitis Intelligibility: Intelligible Motor Planning: Witnin functional limits Motor Speech Errors: Not applicable   GO            Joanna [name] B. Joanna [name], M.S., CCC-SLP Speech-Language Pathologist         Joanna [name] 11/28/2016, 11:16 AM

## 2016-11-28 NOTE — Progress Notes (Signed)
ANTICOAGULATION CONSULT NOTE - FOLLOW UP  Pharmacy Consult:  Coumadin Indication: atrial fibrillation  No Known Allergies  Patient Measurements: Height: 5\' 3"  (160 cm) Weight: 181 lb 3.5 oz (82.2 kg) IBW/kg (Calculated) : 52.4  Vital Signs: Temp: 98 F (36.7 C) (01/15 1028) Temp Source: Oral (01/15 0640) BP: 146/76 (01/15 1028) Pulse Rate: 71 (01/15 1028)  Labs:  Recent Labs  11/27/16 1755 11/28/16 0416  HGB 13.4 13.5  HCT 40.5 42.0  PLT 168 145*  LABPROT 22.2* 21.3*  INR 1.91 1.82  CREATININE 1.09*  --     Estimated Creatinine Clearance: 44.6 mL/min (by C-G formula based on SCr of 1.09 mg/dL (H)).     Assessment: [age] YOF admitted 11/27/2016 with fall after feeling off balance.  CT/MRI negative for acute abnormalities.  Pharmacy consulted to continue Coumadin from PTA for history of Afib and CVA.   INR remains sub-therapeutic and trended down slightly.  No bleeding reported.  PTA warfarin regimen per clinic note on 09/2016: 5 mg daily, except 7.5 mg on Friday   Goal of Therapy:  INR 2-3    Plan:  - Repeat Coumadin 7.5mg  PO today.  Increase dose in AM if no response. - Monitor daily INR, CBC and signs/symptoms of bleeding   Nadina Fomby D. Laney Potashang, PharmD, BCPS Pager:  [phone] 11/28/2016, 10:32 AM

## 2016-11-28 NOTE — Evaluation (Signed)
Physical Therapy Evaluation Patient Details Name: Joanna [name] MRN: [id number] DOB: 1941/06/16 Today's Date: 11/28/2016   History of Present Illness  Pt is a 76 y.o. female who presented to the ED following a fall due to feeling weak. She also presented with slow and slurred speech. She denies new numbness or weakness but has R swhoulder pain and weakness at baseline. CT negative and MRI revealing no acute abnormality but old L corona radiata infarct and chronic microvascular ischemia. She has a PMH significant for chronic afib on coumadin, CHF, CKD, stroke, dysnomia, hypertension, ischemic bowel disease, acute renal failure, aphasia, left ventricular dysfunction, and obesity.  Clinical Impression   Pt admitted with above diagnosis. Pt currently with functional limitations due to the deficits listed below (see PT Problem List). Overall moving well and close to baseline; showing some balance deficits worth looking further into -- will plan on Berg or DGI if she is here tomorrow; Worth considering Outpt PT -- pt is considering;  Pt will benefit from skilled PT to increase their independence and safety with mobility to allow discharge to the venue listed below.       Follow Up Recommendations Outpatient PT (For gait and balance; Pt is deciding if she wants to go)    Nurse, learning disability;Other (comment) (quite hesitant to use cane)    Recommendations for Other Services       Precautions / Restrictions Precautions Precautions: Fall      Mobility  Bed Mobility                  Transfers Overall transfer level: Needs assistance Equipment used: None Transfers: Sit to/from Stand Sit to Stand: Supervision         General transfer comment: Noting use of UEs on armrests and/or grab bars during transition  Ambulation/Gait Ambulation/Gait assistance: Supervision Ambulation Distance (Feet): 200 Feet Assistive device: None (and occasional use of hallwya rail) Gait  Pattern/deviations: Step-through pattern Gait velocity: slowed   General Gait Details: Occasionally reaching out for UE support  Stairs Stairs: Yes Stairs assistance: Supervision Stair Management: Two rails;Step to pattern;Forwards Number of Stairs: 5 General stair comments: No difficulty  Wheelchair Mobility    Modified Rankin (Stroke Patients Only)       Balance     Sitting balance-Leahy Scale: Good       Standing balance-Leahy Scale: Fair                               Pertinent Vitals/Pain Pain Assessment: No/denies pain    Home Living Family/patient expects to be discharged to:: Private residence Living Arrangements: Alone Available Help at Discharge: Family;Available PRN/intermittently Type of Home: House Home Access: Stairs to enter Entrance Stairs-Rails: Right;Left;Can reach both Entrance Stairs-Number of Steps: 3 Home Layout: One level Home Equipment: None Additional Comments: Daughter and son live nearby and visit regularly    Prior Function Level of Independence: Independent         Comments: Driving     Hand Dominance   Dominant Hand: Right    Extremity/Trunk Assessment   Upper Extremity Assessment Upper Extremity Assessment: Defer to OT evaluation    Lower Extremity Assessment Lower Extremity Assessment: Overall WFL for tasks assessed       Communication   Communication: Expressive difficulties (Slightly slurred and some slowed word finding.)  Cognition Arousal/Alertness: Awake/alert Behavior During Therapy: WFL for tasks assessed/performed Overall Cognitive Status: No family/caregiver present to  determine baseline cognitive functioning                      General Comments      Exercises     Assessment/Plan    PT Assessment Patient needs continued PT services  PT Problem List Decreased balance;Decreased coordination;Decreased cognition          PT Treatment Interventions DME instruction;Gait  training;Stair training;Functional mobility training;Therapeutic activities;Therapeutic exercise;Balance training;Patient/family education;Cognitive remediation    PT Goals (Current goals can be found in the Care Plan section)  Acute Rehab PT Goals Patient Stated Goal: to go home and be independent PT Goal Formulation: With patient Time For Goal Achievement: 12/05/16 Potential to Achieve Goals: Good Additional Goals Additional Goal #1: Pt will score greater than 49/56 on Berg or greater than 19/24 on DGI to demonstrate decr risk of falls    Frequency Min 3X/week   Barriers to discharge   Would like for her adult children to visit a bit more frequently    Co-evaluation               End of Session Equipment Utilized During Treatment: Gait belt Activity Tolerance: Patient tolerated treatment well Patient left: in chair;with call bell/phone within reach (Eating lunch) Nurse Communication: Mobility status    Functional Assessment Tool Used: Clinical Judgement Functional Limitation: Mobility: Walking and moving around Mobility: Walking and Moving Around Current Status (M0102(G8978): At least 1 percent but less than 20 percent impaired, limited or restricted Mobility: Walking and Moving Around Goal Status [phone](G8979): 0 percent impaired, limited or restricted    Time: 1123-1140 PT Time Calculation (min) (ACUTE ONLY): 17 min   Charges:   PT Evaluation $PT Eval Low Complexity: 1 Procedure     PT G Codes:   PT G-Codes **NOT FOR INPATIENT CLASS** Functional Assessment Tool Used: Clinical Judgement Functional Limitation: Mobility: Walking and moving around Mobility: Walking and Moving Around Current Status (U4403(G8978): At least 1 percent but less than 20 percent impaired, limited or restricted Mobility: Walking and Moving Around Goal Status [phone](G8979): 0 percent impaired, limited or restricted    Levi AlandHolly H Amarianna Abplanalp 11/28/2016, 1:01 PM  Van ClinesHolly Kewanna Kasprzak, PT  Acute Rehabilitation  Services Pager [phone] Office [phone]

## 2016-11-28 NOTE — Progress Notes (Signed)
PROGRESS NOTE    Joanna [name]  ZOX:[id number] DOB: Sep 28, 1941 DOA: 11/27/2016 PCP: Gaye AlkenBARNES,ELIZABETH STEWART, MD   Outpatient Specialists:     Brief Narrative:  Joanna Riffleatricia A Jenkin is a 76 y.o. female with medical history significant of chronic afib on coumadin, CHF, CKD, stroke p/w fall after feeling off balance. The fall was heard by son who had to assist her to the couch. Pt usually is able to walk on her own. Son states when he got to the house around 4pm the patient's speech was slower than normal and slightly slurred as it is now, off her baseline speech which was last witnessed as normal yesterday by her daughter. Pt denies any symptoms at this time. Had a headache earlier but states this has resolved. Denies any pain from the fall. Denies any new numbness or weakness, any recent illness. Pt notes her right shoulder has been bothering her and this is why she seems weaker on the right, denies that this is new or different than usual.    Assessment & Plan:   Principal Problem:   Stroke-like symptoms Active Problems:   Hypertension   Chronic atrial fibrillation (HCC)    TIA  -MRI negative for acute CVA-- shows Chronic microvascular ischemia and old left corona radiata  Infarct  -echo  -carotid: No significant (1-39%) ICA stenosis  -LDL: 64  -HgbA1C pending  A.Fib - Continue rate control -coumadin- subtherapeutic (apparantly was resistant to change to NOAC- documented in previous cardiology notes)  HTN - continue home meds  DVT prophylaxis:  Fully anticoagulated (coumadin sub-therapeutic)   Code Status: Full Code   Family Communication: No family at bedside-- will try again later to discuss NOAC  Disposition Plan:     Consultants:      Subjective: Appears to have some slow speech  Objective: Vitals:   11/28/16 0240 11/28/16 0440 11/28/16 0640 11/28/16 1028  BP: 120/67 140/64 132/67 (!) 146/76  Pulse: 62   71  Resp: 18 18 18 18   Temp: 97  F (36.1 C) 97 F (36.1 C) 98 F (36.7 C) 98 F (36.7 C)  TempSrc: Oral Oral Oral   SpO2: 100% 100% 100% 98%  Weight:      Height:        Intake/Output Summary (Last 24 hours) at 11/28/16 1104 Last data filed at 11/27/16 2051  Gross per 24 hour  Intake              500 ml  Output                0 ml  Net              500 ml   Filed Weights   11/27/16 1750 11/27/16 2238  Weight: 83 kg (183 lb) 82.2 kg (181 lb 3.5 oz)    Examination:  General exam: Appears calm and comfortable  Respiratory system: Clear to auscultation. Respiratory effort normal. Cardiovascular system: iRRR. No JVD, murmurs, rubs, gallops or clicks. No pedal edema. Gastrointestinal system: Abdomen is nondistended, soft and nontender. No organomegaly or masses felt. Normal bowel sounds heard. Central nervous system: Alert and oriented. No focal neurological deficits.     Data Reviewed: I have personally reviewed following labs and imaging studies  CBC:  Recent Labs Lab 11/27/16 1755 11/28/16 0416  WBC 10.1 9.1  NEUTROABS 7.5  --   HGB 13.4 13.5  HCT 40.5 42.0  MCV 99.3 101.2*  PLT 168 145*   Basic Metabolic Panel:  Recent Labs Lab 11/27/16 1755  NA 137  K 4.0  CL 105  CO2 21*  GLUCOSE 104*  BUN 13  CREATININE 1.09*  CALCIUM 8.8*   GFR: Estimated Creatinine Clearance: 44.6 mL/min (by C-G formula based on SCr of 1.09 mg/dL (H)). Liver Function Tests: No results for input(s): AST, ALT, ALKPHOS, BILITOT, PROT, ALBUMIN in the last 168 hours. No results for input(s): LIPASE, AMYLASE in the last 168 hours. No results for input(s): AMMONIA in the last 168 hours. Coagulation Profile:  Recent Labs Lab 11/27/16 1755 11/28/16 0416  INR 1.91 1.82   Cardiac Enzymes: No results for input(s): CKTOTAL, CKMB, CKMBINDEX, TROPONINI in the last 168 hours. BNP (last 3 results) No results for input(s): PROBNP in the last 8760 hours. HbA1C: No results for input(s): HGBA1C in the last 72  hours. CBG:  Recent Labs Lab 11/27/16 1759  GLUCAP 110*   Lipid Profile:  Recent Labs  11/28/16 0416  CHOL 120  HDL 46  LDLCALC 64  TRIG 51  CHOLHDL 2.6   Thyroid Function Tests: No results for input(s): TSH, T4TOTAL, FREET4, T3FREE, THYROIDAB in the last 72 hours. Anemia Panel: No results for input(s): VITAMINB12, FOLATE, FERRITIN, TIBC, IRON, RETICCTPCT in the last 72 hours. Urine analysis:    Component Value Date/Time   COLORURINE YELLOW 10/26/2015 2004   APPEARANCEUR CLEAR 10/26/2015 2004   LABSPEC 1.013 10/26/2015 2004   PHURINE 6.5 10/26/2015 2004   GLUCOSEU NEGATIVE 10/26/2015 2004   HGBUR TRACE (A) 10/26/2015 2004   BILIRUBINUR NEGATIVE 10/26/2015 2004   KETONESUR 15 (A) 10/26/2015 2004   PROTEINUR NEGATIVE 10/26/2015 2004   UROBILINOGEN 1.0 12/25/2010 2248   NITRITE NEGATIVE 10/26/2015 2004   LEUKOCYTESUR NEGATIVE 10/26/2015 2004     )No results found for this or any previous visit (from the past 240 hour(s)).    Anti-infectives    None       Radiology Studies: Ct Head Wo Contrast  Result Date: 11/27/2016 CLINICAL DATA:  PT SLIPPED OFF CHAIR AND HIT BACK OF HEAD ON EDGE OF ITPT STS NO PAIN IN HEAD OR NECK EXAM: CT HEAD WITHOUT CONTRAST CT CERVICAL SPINE WITHOUT CONTRAST TECHNIQUE: Multidetector CT imaging of the head and cervical spine was performed following the standard protocol without intravenous contrast. Multiplanar CT image reconstructions of the cervical spine were also generated. COMPARISON:  10/26/2015 FINDINGS: CT HEAD FINDINGS Brain: No evidence of acute infarction, hemorrhage, hydrocephalus, extra-axial collection or mass lesion/mass effect. The ventricles and sulci are enlarged reflecting mild atrophy. There is an old subcortical lacune infarct in the lateral left frontal lobe. Vascular: No hyperdense vessel or unexpected calcification. Skull: Normal. Negative for fracture or focal lesion. Sinuses/Orbits: Globes and orbits are unremarkable.  Visualized sinuses and mastoid air cells are clear. Other: None. CT CERVICAL SPINE FINDINGS Alignment: Normal. Skull base and vertebrae: No acute fracture. No primary bone lesion or focal pathologic process. Soft tissues and spinal canal: No prevertebral fluid or swelling. No visible canal hematoma. Disc levels: Mild to moderate loss of disc height noted throughout the cervical spine, greatest at C6-C7. Mild spondylotic disc bulging with uncovertebral and endplate spurring. No convincing disc herniation. Upper chest: No acute finding. There is an apparent right subclavian artery passing posterior to the esophagus there are dense aortic and branch vessel calcifications including the carotid arteries. Other: None. IMPRESSION: HEAD CT:  No acute intracranial abnormalities.  No skull fracture. CERVICAL CT:  No fracture or acute finding. Electronically Signed   By: Amie Portland  M.D.   On: 11/27/2016 19:47   Ct Cervical Spine Wo Contrast  Result Date: 11/27/2016 CLINICAL DATA:  PT SLIPPED OFF CHAIR AND HIT BACK OF HEAD ON EDGE OF ITPT STS NO PAIN IN HEAD OR NECK EXAM: CT HEAD WITHOUT CONTRAST CT CERVICAL SPINE WITHOUT CONTRAST TECHNIQUE: Multidetector CT imaging of the head and cervical spine was performed following the standard protocol without intravenous contrast. Multiplanar CT image reconstructions of the cervical spine were also generated. COMPARISON:  10/26/2015 FINDINGS: CT HEAD FINDINGS Brain: No evidence of acute infarction, hemorrhage, hydrocephalus, extra-axial collection or mass lesion/mass effect. The ventricles and sulci are enlarged reflecting mild atrophy. There is an old subcortical lacune infarct in the lateral left frontal lobe. Vascular: No hyperdense vessel or unexpected calcification. Skull: Normal. Negative for fracture or focal lesion. Sinuses/Orbits: Globes and orbits are unremarkable. Visualized sinuses and mastoid air cells are clear. Other: None. CT CERVICAL SPINE FINDINGS Alignment:  Normal. Skull base and vertebrae: No acute fracture. No primary bone lesion or focal pathologic process. Soft tissues and spinal canal: No prevertebral fluid or swelling. No visible canal hematoma. Disc levels: Mild to moderate loss of disc height noted throughout the cervical spine, greatest at C6-C7. Mild spondylotic disc bulging with uncovertebral and endplate spurring. No convincing disc herniation. Upper chest: No acute finding. There is an apparent right subclavian artery passing posterior to the esophagus there are dense aortic and branch vessel calcifications including the carotid arteries. Other: None. IMPRESSION: HEAD CT:  No acute intracranial abnormalities.  No skull fracture. CERVICAL CT:  No fracture or acute finding. Electronically Signed   By: Amie Portland M.D.   On: 11/27/2016 19:47   Mr Brain Wo Contrast  Result Date: 11/27/2016 CLINICAL DATA:  Atrial fibrillation.  Fall and dizziness. EXAM: MRI HEAD WITHOUT CONTRAST MRA HEAD WITHOUT CONTRAST TECHNIQUE: Multiplanar, multiecho pulse sequences of the brain and surrounding structures were obtained without intravenous contrast. Angiographic images of the head were obtained using MRA technique without contrast. COMPARISON:  Head CT 11/27/2016 Brain MRI 10/27/2015 FINDINGS: MRI HEAD FINDINGS Brain: No focal diffusion restriction to indicate acute infarct. No intraparenchymal hemorrhage. There is multifocal hyperintense T2-weighted signal within the periventricular white matter, most often seen in the setting of chronic microvascular ischemia. There is an old left corona radiata infarct. No mass lesion or midline shift. No hydrocephalus or extra-axial fluid collection. The midline structures are normal. No age advanced or lobar predominant atrophy. Vascular: Major intracranial arterial and venous sinus flow voids are preserved. No evidence of chronic microhemorrhage or amyloid angiopathy. Skull and upper cervical spine: The visualized skull base,  calvarium, upper cervical spine and extracranial soft tissues are normal. Sinuses/Orbits: No fluid levels or advanced mucosal thickening. No mastoid effusion. Normal orbits. MRA HEAD FINDINGS Intracranial internal carotid arteries: Normal. Anterior cerebral arteries: Normal. Middle cerebral arteries: Normal. Posterior communicating arteries: Absent bilaterally. Posterior cerebral arteries: Normal. Basilar artery: Normal. Vertebral arteries: Left dominant. Normal. Superior cerebellar arteries: Normal. Anterior inferior cerebellar arteries: Normal. Posterior inferior cerebellar arteries: Normal. IMPRESSION: 1. Chronic microvascular ischemia and old left corona radiata infarct. No acute intracranial abnormality. 2. Normal intracranial MRA. Electronically Signed   By: Deatra Robinson M.D.   On: 11/27/2016 22:26   Mr Maxine Glenn Head/brain ZO Cm  Result Date: 11/27/2016 CLINICAL DATA:  Atrial fibrillation.  Fall and dizziness. EXAM: MRI HEAD WITHOUT CONTRAST MRA HEAD WITHOUT CONTRAST TECHNIQUE: Multiplanar, multiecho pulse sequences of the brain and surrounding structures were obtained without intravenous contrast. Angiographic images of the head  were obtained using MRA technique without contrast. COMPARISON:  Head CT 11/27/2016 Brain MRI 10/27/2015 FINDINGS: MRI HEAD FINDINGS Brain: No focal diffusion restriction to indicate acute infarct. No intraparenchymal hemorrhage. There is multifocal hyperintense T2-weighted signal within the periventricular white matter, most often seen in the setting of chronic microvascular ischemia. There is an old left corona radiata infarct. No mass lesion or midline shift. No hydrocephalus or extra-axial fluid collection. The midline structures are normal. No age advanced or lobar predominant atrophy. Vascular: Major intracranial arterial and venous sinus flow voids are preserved. No evidence of chronic microhemorrhage or amyloid angiopathy. Skull and upper cervical spine: The visualized skull  base, calvarium, upper cervical spine and extracranial soft tissues are normal. Sinuses/Orbits: No fluid levels or advanced mucosal thickening. No mastoid effusion. Normal orbits. MRA HEAD FINDINGS Intracranial internal carotid arteries: Normal. Anterior cerebral arteries: Normal. Middle cerebral arteries: Normal. Posterior communicating arteries: Absent bilaterally. Posterior cerebral arteries: Normal. Basilar artery: Normal. Vertebral arteries: Left dominant. Normal. Superior cerebellar arteries: Normal. Anterior inferior cerebellar arteries: Normal. Posterior inferior cerebellar arteries: Normal. IMPRESSION: 1. Chronic microvascular ischemia and old left corona radiata infarct. No acute intracranial abnormality. 2. Normal intracranial MRA. Electronically Signed   By: Deatra Robinson M.D.   On: 11/27/2016 22:26        Scheduled Meds: . diltiazem  120 mg Oral Daily  . furosemide  40 mg Oral Daily  . metoprolol  100 mg Oral BID  . warfarin  7.5 mg Oral ONCE-1800  . Warfarin - Pharmacist Dosing Inpatient   Does not apply q1800   Continuous Infusions:   LOS: 0 days    Time spent: 25 min    Kalief Kattner U Khloi Rawl, DO [facility] Pager [phone]  If 7PM-7AM, please contact night-coverage [email or web address] Password [email or web address] 11/28/2016, 11:04 AM

## 2016-11-28 NOTE — Care Management Obs Status (Signed)
MEDICARE OBSERVATION STATUS NOTIFICATION   Patient Details  Name: Joanna [name] MRN: [id number] Date of Birth: 05/14/1941   Medicare Observation Status Notification Given:  Yes (MRI negative)    Kermit BaloKelli F Kooper Godshall, RN 11/28/2016, 12:15 PM

## 2016-11-29 DIAGNOSIS — G459 Transient cerebral ischemic attack, unspecified: Secondary | ICD-10-CM | POA: Diagnosis not present

## 2016-11-29 DIAGNOSIS — I482 Chronic atrial fibrillation: Secondary | ICD-10-CM | POA: Diagnosis not present

## 2016-11-29 DIAGNOSIS — I10 Essential (primary) hypertension: Secondary | ICD-10-CM | POA: Diagnosis not present

## 2016-11-29 LAB — CBC
HEMATOCRIT: 41.2 % (ref 36.0–46.0)
HEMOGLOBIN: 13.3 g/dL (ref 12.0–15.0)
MCH: 32.1 pg (ref 26.0–34.0)
MCHC: 32.3 g/dL (ref 30.0–36.0)
MCV: 99.5 fL (ref 78.0–100.0)
Platelets: 164 10*3/uL (ref 150–400)
RBC: 4.14 MIL/uL (ref 3.87–5.11)
RDW: 13.5 % (ref 11.5–15.5)
WBC: 9.4 10*3/uL (ref 4.0–10.5)

## 2016-11-29 LAB — PROTIME-INR
INR: 1.89
Prothrombin Time: 22 seconds — ABNORMAL HIGH (ref 11.4–15.2)

## 2016-11-29 LAB — HEMOGLOBIN A1C
Hgb A1c MFr Bld: 5.9 % — ABNORMAL HIGH (ref 4.8–5.6)
Mean Plasma Glucose: 123 mg/dL

## 2016-11-29 MED ORDER — APIXABAN 5 MG PO TABS: 5.0000 mg | ORAL_TABLET | Freq: Two times a day (BID) | ORAL | 0 refills | Status: DC

## 2016-11-29 MED ORDER — WARFARIN SODIUM 7.5 MG PO TABS: 7.5000 mg | ORAL_TABLET | Freq: Once | ORAL | Status: DC

## 2016-11-29 MED ORDER — APIXABAN 5 MG PO TABS
5.0000 mg | ORAL_TABLET | Freq: Two times a day (BID) | ORAL | Status: DC
  Administered 2016-11-29: 5 mg via ORAL
  Filled 2016-11-29: qty 1

## 2016-11-29 NOTE — Progress Notes (Addendum)
ANTICOAGULATION CONSULT NOTE - FOLLOW UP  Pharmacy Consult:  Coumadin Indication: atrial fibrillation  No Known Allergies  Patient Measurements: Height: 5\' 3"  (160 cm) Weight: 181 lb 3.5 oz (82.2 kg) IBW/kg (Calculated) : 52.4  Vital Signs: Temp: 98.6 F (37 C) (01/16 0946) Temp Source: Oral (01/16 0946) BP: 107/68 (01/16 0946) Pulse Rate: 77 (01/16 0946)  Labs:  Recent Labs  11/27/16 1755 11/28/16 0416 11/29/16 0218  HGB 13.4 13.5 13.3  HCT 40.5 42.0 41.2  PLT 168 145* 164  LABPROT 22.2* 21.3* 22.0*  INR 1.91 1.82 1.89  CREATININE 1.09*  --   --     Estimated Creatinine Clearance: 44.6 mL/min (by C-G formula based on SCr of 1.09 mg/dL (H)).     Assessment: Joanna [name] admitted 11/27/2016 with fall after feeling off balance.  CT/MRI negative for acute abnormalities.  Pharmacy consulted to continue Coumadin from PTA for history of Afib and CVA.   INR remains sub-therapeutic but starting to trend back up.  No bleeding reported.  PTA warfarin regimen per clinic note on 09/2016: 5 mg daily except 7.5 mg on Friday   Goal of Therapy:  INR 2-3    Plan:  - Repeat Coumadin 7.5mg  PO today.  Increase dose in AM if no response. - Monitor daily INR, CBC and signs/symptoms of bleeding   Joanna [name] D. Laney Potashang, PharmD, BCPS Pager:  [phone] 11/29/2016, 10:06 AM    =========================   Addendum: - switch to Eliquis - age < 80, weight > 60kg, SCr < 1.5, patient qualifies for full dosing   Plan: - Eliquis 5mg  PO BID - Pharmacy will sign off and follow peripherally.  Thank you for the consult!    Joanna [name] D. Laney Potashang, PharmD, BCPS Pager:  [phone] 11/29/2016, 10:56 AM

## 2016-11-29 NOTE — Care Management Note (Addendum)
Case Management Note  Patient Details  Name: Pricilla Riffleatricia A Hellinger MRN: [id number] Date of Birth: 1941/02/02  Subjective/Objective:                    Action/Plan: Patient discharging home with self care. CM consulted for outpatient therapy. Pt currently is refusing outpatient therapy.  Pt starting on Eliquis. CM provided her a 30 day free card and submitted a benefits check to see the cost for the patient after the first 30 days.  Pt states she has her daughter to provide transportation home.  Addendum (1650): unable to obtain benefits check for Eliquis. CM informed pt and daughter. CM spoke to pts daughter over the phone and encouraged her to have script run at the pharmacy tonight. Daughter in agreement. Also encouraged daughter to call PCP if the medication is too expensive for her mother.    Expected Discharge Date:  11/29/16               Expected Discharge Plan:  Home/Self Care  In-House Referral:     Discharge planning Services  CM Consult  Post Acute Care Choice:    Choice offered to:     DME Arranged:    DME Agency:     HH Arranged:    HH Agency:     Status of Service:  Completed, signed off  If discussed at [employer] Length of Stay Meetings, dates discussed:    Additional Comments:  Kermit BaloKelli F Fredi Geiler, RN 11/29/2016, 1:10 PM

## 2016-11-29 NOTE — Progress Notes (Signed)
Occupational Therapy Treatment Patient Details Name: Joanna [name] MRN: [id number] DOB: 1941-10-24 Today's Date: 11/29/2016    History of present illness Pt is a 76 y.o. female who presented to the ED following a fall due to feeling weak. She also presented with slow and slurred speech. She denies new numbness or weakness but has R swhoulder pain and weakness at baseline. CT negative and MRI revealing no acute abnormality but old L corona radiata infarct and chronic microvascular ischemia. She has a PMH significant for chronic afib on coumadin, CHF, CKD, stroke, dysnomia, hypertension, ischemic bowel disease, acute renal failure, aphasia, left ventricular dysfunction, and obesity.   OT comments  Pt progressing toward OT goals. Able to complete toilet transfer with supervision and tub transfer with min assist this session. Educated pt on fall prevention strategies, energy conservation strategies, and use of shower seat for safe tub transfers. She is in agreement that this would be helpful but reports that she would rather obtain this herself in the community. Pt continues to demonstrate decreased safety awareness and requires increased time for processing. OT will continue to follow acutely. Recommend initial 24 hour supervision from family post-acute D/C.   Follow Up Recommendations  No OT follow up;Supervision/Assistance - 24 hour    Equipment Recommendations  Tub/shower seat;3 in 1 bedside commode    Recommendations for Other Services      Precautions / Restrictions Precautions Precautions: Fall       Mobility Bed Mobility               General bed mobility comments: OOB in recliner on OT arrival  Transfers Overall transfer level: Needs assistance Equipment used: None Transfers: Sit to/from Stand Sit to Stand: Supervision         General transfer comment: Increased time    Balance Overall balance assessment: Needs assistance Sitting-balance support: No upper  extremity supported;Feet supported Sitting balance-Leahy Scale: Good     Standing balance support: No upper extremity supported;During functional activity Standing balance-Leahy Scale: Fair                     ADL Overall ADL's : Needs assistance/impaired                         Toilet Transfer: Supervision/safety;Ambulation       Tub/ Shower Transfer: Minimal assistance;Ambulation;Shower seat   Functional mobility during ADLs: Supervision/safety General ADL Comments: Pt educated on use of shower seat to improve safety with tub transfers. She reports that she would like to obtain this elsewhere rather than in hospital.      Vision                     Perception     Praxis      Cognition   Behavior During Therapy: [facility]FL for tasks assessed/performed Overall Cognitive Status: No family/caregiver present to determine baseline cognitive functioning Area of Impairment: Attention;Safety/judgement;Problem solving   Current Attention Level: Sustained      Safety/Judgement: Decreased awareness of safety Awareness: Emergent Problem Solving: Slow processing General Comments: Continued increased time for processing and easily distracted.    Extremity/Trunk Assessment               Exercises     Shoulder Instructions       General Comments      Pertinent Vitals/ Pain       Pain Assessment: No/denies pain  Home Living  Prior Functioning/Environment              Frequency  Min 2X/week        Progress Toward Goals  OT Goals(current goals can now be found in the care plan section)  Progress towards OT goals: Progressing toward goals  Acute Rehab OT Goals Patient Stated Goal: to go home and be independent OT Goal Formulation: With patient Time For Goal Achievement: 12/05/16 Potential to Achieve Goals: Good ADL Goals Pt Will Perform Upper Body Bathing: with modified  independence;sitting Pt Will Perform Lower Body Bathing: with modified independence;sit to/from stand Pt Will Transfer to Toilet: with modified independence;ambulating;bedside commode (over toilet) Pt Will Perform Toileting - Clothing Manipulation and hygiene: with modified independence;sit to/from stand Pt Will Perform Tub/Shower Transfer: with modified independence;shower seat;ambulating;3 in 1;Tub transfer  Plan Discharge plan remains appropriate    Co-evaluation                 End of Session Equipment Utilized During Treatment: Gait belt   Activity Tolerance Patient tolerated treatment well   Patient Left in chair;with call bell/phone within reach;with chair alarm set   Nurse Communication Mobility status    Functional Assessment Tool Used: clinical judgement Functional Limitation: Self care Self Care Current Status [phone]): At least 1 percent but less than 20 percent impaired, limited or restricted Self Care Goal Status (U0454): At least 1 percent but less than 20 percent impaired, limited or restricted   Time: 1630-1650 OT Time Calculation (min): 20 min  Charges: OT G-codes **NOT FOR INPATIENT CLASS** Functional Assessment Tool Used: clinical judgement Functional Limitation: Self care Self Care Current Status (U9811): At least 1 percent but less than 20 percent impaired, limited or restricted Self Care Goal Status (B1478): At least 1 percent but less than 20 percent impaired, limited or restricted OT General Charges $OT Visit: 1 Procedure OT Treatments $Self Care/Home Management : 8-22 mins  Doristine Section, OTR/L [phone] 11/29/2016, 5:31 PM

## 2016-11-29 NOTE — Discharge Summary (Signed)
Physician Discharge Summary  Joanna [name] ZOX:[id number] DOB: April 26, 1941 DOA: 11/27/2016  PCP: Gaye Alken, MD  Admit date: 11/27/2016 Discharge date: 11/29/2016   Recommendations for Outpatient Follow-Up:   1. Outpatient PT   Discharge Diagnosis:   Principal Problem:   Stroke-like symptoms Active Problems:   Hypertension   Chronic atrial fibrillation [facility])   Discharge disposition:  Home  Discharge Condition: Improved.  Diet recommendation: Low sodium, heart healthy  Wound care: None.   History of Present Illness:   Joanna [name] is a 76 y.o. female with medical history significant of chronic afib on coumadin, CHF, CKD, stroke p/w fall after feeling off balance. The fall was heard by son who had to assist her to the couch. Pt usually is able to walk on her own. Son states when he got to the house around 4pm the patient's speech was slower than normal and slightly slurred as it is now, off her baseline speech which was last witnessed as normal yesterday by her daughter. Pt denies any symptoms at this time. Had a headache earlier but states this has resolved. Denies any pain from the fall. Denies any new numbness or weakness, any recent illness. Pt notes her right shoulder has been bothering her and this is why she seems weaker on the right, denies that this is new or different than usual. Pt is on coumadin, states INR is very stable.   Hospital Course by Problem:   TIA- suspect due to subtherapeutic INR-- discussed at length with daughter and patient-- agreeable to switch to NOAC-- eliquis             -MRI negative for acute CVA-- shows Chronic microvascular ischemia and old left corona radiata             Infarct             -echo: Left ventricle: The cavity size was normal. Wall thickness was    increased in a pattern of mild LVH. Systolic function was normal.    The estimated ejection fraction was in the range of 55% to 60%.  -carotid: No significant (1-39%) ICA stenosis             -LDL: 64             -HgbA1C 5.9  A.Fib - Continue rate control -coumadin- subtherapeutic (apparantly was resistant to change to NOAC- documented in previous cardiology notes but now agreeable -cardiology follow up  HTN - continue home meds    Medical Consultants:    None.   Discharge Exam:   Vitals:   11/29/16 0501 11/29/16 0946  BP: (!) 137/58 107/68  Pulse: 72 77  Resp: 18 15  Temp: 98.9 F (37.2 C) 98.6 F (37 C)   Vitals:   11/28/16 2158 11/29/16 0108 11/29/16 0501 11/29/16 0946  BP: 139/63 140/69 (!) 137/58 107/68  Pulse: 77 76 72 77  Resp: 16 18 18 15   Temp: 98.6 F (37 C) 98.3 F (36.8 C) 98.9 F (37.2 C) 98.6 F (37 C)  TempSrc: Oral Oral Oral Oral  SpO2: 97% 100% 98% 100%  Weight:      Height:        Gen:  NAD- back to baseline   The results of significant diagnostics from this hospitalization (including imaging, microbiology, ancillary and laboratory) are listed below for reference.     Procedures and Diagnostic Studies:   Ct Head Wo Contrast  Result Date: 11/27/2016 CLINICAL DATA:  PT SLIPPED  OFF CHAIR AND HIT BACK OF HEAD ON EDGE OF ITPT STS NO PAIN IN HEAD OR NECK EXAM: CT HEAD WITHOUT CONTRAST CT CERVICAL SPINE WITHOUT CONTRAST TECHNIQUE: Multidetector CT imaging of the head and cervical spine was performed following the standard protocol without intravenous contrast. Multiplanar CT image reconstructions of the cervical spine were also generated. COMPARISON:  10/26/2015 FINDINGS: CT HEAD FINDINGS Brain: No evidence of acute infarction, hemorrhage, hydrocephalus, extra-axial collection or mass lesion/mass effect. The ventricles and sulci are enlarged reflecting mild atrophy. There is an old subcortical lacune infarct in the lateral left frontal lobe. Vascular: No hyperdense vessel or unexpected calcification. Skull: Normal. Negative for fracture or focal lesion. Sinuses/Orbits: Globes and  orbits are unremarkable. Visualized sinuses and mastoid air cells are clear. Other: None. CT CERVICAL SPINE FINDINGS Alignment: Normal. Skull base and vertebrae: No acute fracture. No primary bone lesion or focal pathologic process. Soft tissues and spinal canal: No prevertebral fluid or swelling. No visible canal hematoma. Disc levels: Mild to moderate loss of disc height noted throughout the cervical spine, greatest at C6-C7. Mild spondylotic disc bulging with uncovertebral and endplate spurring. No convincing disc herniation. Upper chest: No acute finding. There is an apparent right subclavian artery passing posterior to the esophagus there are dense aortic and branch vessel calcifications including the carotid arteries. Other: None. IMPRESSION: HEAD CT:  No acute intracranial abnormalities.  No skull fracture. CERVICAL CT:  No fracture or acute finding. Electronically Signed   By: Amie Portland M.D.   On: 11/27/2016 19:47   Ct Cervical Spine Wo Contrast  Result Date: 11/27/2016 CLINICAL DATA:  PT SLIPPED OFF CHAIR AND HIT BACK OF HEAD ON EDGE OF ITPT STS NO PAIN IN HEAD OR NECK EXAM: CT HEAD WITHOUT CONTRAST CT CERVICAL SPINE WITHOUT CONTRAST TECHNIQUE: Multidetector CT imaging of the head and cervical spine was performed following the standard protocol without intravenous contrast. Multiplanar CT image reconstructions of the cervical spine were also generated. COMPARISON:  10/26/2015 FINDINGS: CT HEAD FINDINGS Brain: No evidence of acute infarction, hemorrhage, hydrocephalus, extra-axial collection or mass lesion/mass effect. The ventricles and sulci are enlarged reflecting mild atrophy. There is an old subcortical lacune infarct in the lateral left frontal lobe. Vascular: No hyperdense vessel or unexpected calcification. Skull: Normal. Negative for fracture or focal lesion. Sinuses/Orbits: Globes and orbits are unremarkable. Visualized sinuses and mastoid air cells are clear. Other: None. CT CERVICAL SPINE  FINDINGS Alignment: Normal. Skull base and vertebrae: No acute fracture. No primary bone lesion or focal pathologic process. Soft tissues and spinal canal: No prevertebral fluid or swelling. No visible canal hematoma. Disc levels: Mild to moderate loss of disc height noted throughout the cervical spine, greatest at C6-C7. Mild spondylotic disc bulging with uncovertebral and endplate spurring. No convincing disc herniation. Upper chest: No acute finding. There is an apparent right subclavian artery passing posterior to the esophagus there are dense aortic and branch vessel calcifications including the carotid arteries. Other: None. IMPRESSION: HEAD CT:  No acute intracranial abnormalities.  No skull fracture. CERVICAL CT:  No fracture or acute finding. Electronically Signed   By: Amie Portland M.D.   On: 11/27/2016 19:47   Mr Brain Wo Contrast  Result Date: 11/27/2016 CLINICAL DATA:  Atrial fibrillation.  Fall and dizziness. EXAM: MRI HEAD WITHOUT CONTRAST MRA HEAD WITHOUT CONTRAST TECHNIQUE: Multiplanar, multiecho pulse sequences of the brain and surrounding structures were obtained without intravenous contrast. Angiographic images of the head were obtained using MRA technique without contrast. COMPARISON:  Head CT 11/27/2016 Brain MRI 10/27/2015 FINDINGS: MRI HEAD FINDINGS Brain: No focal diffusion restriction to indicate acute infarct. No intraparenchymal hemorrhage. There is multifocal hyperintense T2-weighted signal within the periventricular white matter, most often seen in the setting of chronic microvascular ischemia. There is an old left corona radiata infarct. No mass lesion or midline shift. No hydrocephalus or extra-axial fluid collection. The midline structures are normal. No age advanced or lobar predominant atrophy. Vascular: Major intracranial arterial and venous sinus flow voids are preserved. No evidence of chronic microhemorrhage or amyloid angiopathy. Skull and upper cervical spine: The  visualized skull base, calvarium, upper cervical spine and extracranial soft tissues are normal. Sinuses/Orbits: No fluid levels or advanced mucosal thickening. No mastoid effusion. Normal orbits. MRA HEAD FINDINGS Intracranial internal carotid arteries: Normal. Anterior cerebral arteries: Normal. Middle cerebral arteries: Normal. Posterior communicating arteries: Absent bilaterally. Posterior cerebral arteries: Normal. Basilar artery: Normal. Vertebral arteries: Left dominant. Normal. Superior cerebellar arteries: Normal. Anterior inferior cerebellar arteries: Normal. Posterior inferior cerebellar arteries: Normal. IMPRESSION: 1. Chronic microvascular ischemia and old left corona radiata infarct. No acute intracranial abnormality. 2. Normal intracranial MRA. Electronically Signed   By: Deatra Robinson M.D.   On: 11/27/2016 22:26   Mr Maxine Glenn Head/brain NW Cm  Result Date: 11/27/2016 CLINICAL DATA:  Atrial fibrillation.  Fall and dizziness. EXAM: MRI HEAD WITHOUT CONTRAST MRA HEAD WITHOUT CONTRAST TECHNIQUE: Multiplanar, multiecho pulse sequences of the brain and surrounding structures were obtained without intravenous contrast. Angiographic images of the head were obtained using MRA technique without contrast. COMPARISON:  Head CT 11/27/2016 Brain MRI 10/27/2015 FINDINGS: MRI HEAD FINDINGS Brain: No focal diffusion restriction to indicate acute infarct. No intraparenchymal hemorrhage. There is multifocal hyperintense T2-weighted signal within the periventricular white matter, most often seen in the setting of chronic microvascular ischemia. There is an old left corona radiata infarct. No mass lesion or midline shift. No hydrocephalus or extra-axial fluid collection. The midline structures are normal. No age advanced or lobar predominant atrophy. Vascular: Major intracranial arterial and venous sinus flow voids are preserved. No evidence of chronic microhemorrhage or amyloid angiopathy. Skull and upper cervical spine:  The visualized skull base, calvarium, upper cervical spine and extracranial soft tissues are normal. Sinuses/Orbits: No fluid levels or advanced mucosal thickening. No mastoid effusion. Normal orbits. MRA HEAD FINDINGS Intracranial internal carotid arteries: Normal. Anterior cerebral arteries: Normal. Middle cerebral arteries: Normal. Posterior communicating arteries: Absent bilaterally. Posterior cerebral arteries: Normal. Basilar artery: Normal. Vertebral arteries: Left dominant. Normal. Superior cerebellar arteries: Normal. Anterior inferior cerebellar arteries: Normal. Posterior inferior cerebellar arteries: Normal. IMPRESSION: 1. Chronic microvascular ischemia and old left corona radiata infarct. No acute intracranial abnormality. 2. Normal intracranial MRA. Electronically Signed   By: Deatra Robinson M.D.   On: 11/27/2016 22:26     Labs:   Basic Metabolic Panel:  Recent Labs Lab 11/27/16 1755  NA 137  K 4.0  CL 105  CO2 21*  GLUCOSE 104*  BUN 13  CREATININE 1.09*  CALCIUM 8.8*   GFR Estimated Creatinine Clearance: 44.6 mL/min (by C-G formula based on SCr of 1.09 mg/dL (H)). Liver Function Tests: No results for input(s): AST, ALT, ALKPHOS, BILITOT, PROT, ALBUMIN in the last 168 hours. No results for input(s): LIPASE, AMYLASE in the last 168 hours. No results for input(s): AMMONIA in the last 168 hours. Coagulation profile  Recent Labs Lab 11/27/16 1755 11/28/16 0416 11/29/16 0218  INR 1.91 1.82 1.89    CBC:  Recent Labs Lab 11/27/16 1755 11/28/16 0416 11/29/16  0218  WBC 10.1 9.1 9.4  NEUTROABS 7.5  --   --   HGB 13.4 13.5 13.3  HCT 40.5 42.0 41.2  MCV 99.3 101.2* 99.5  PLT 168 145* 164   Cardiac Enzymes: No results for input(s): CKTOTAL, CKMB, CKMBINDEX, TROPONINI in the last 168 hours. BNP: Invalid input(s): POCBNP CBG:  Recent Labs Lab 11/27/16 1759  GLUCAP 110*   D-Dimer No results for input(s): DDIMER in the last 72 hours. Hgb A1c  Recent Labs   11/28/16 0416  HGBA1C 5.9*   Lipid Profile  Recent Labs  11/28/16 0416  CHOL 120  HDL 46  LDLCALC 64  TRIG 51  CHOLHDL 2.6   Thyroid function studies No results for input(s): TSH, T4TOTAL, T3FREE, THYROIDAB in the last 72 hours.  Invalid input(s): FREET3 Anemia work up No results for input(s): VITAMINB12, FOLATE, FERRITIN, TIBC, IRON, RETICCTPCT in the last 72 hours. Microbiology No results found for this or any previous visit (from the past 240 hour(s)).   Discharge Instructions:   Discharge Instructions    Diet - low sodium heart healthy    Complete by:  As directed    Discharge instructions    Complete by:  As directed    Outpatient PT   Increase activity slowly    Complete by:  As directed      Allergies as of 11/29/2016   No Known Allergies     Medication List    STOP taking these medications   warfarin 5 MG tablet Commonly known as:  COUMADIN     TAKE these medications   apixaban 5 MG Tabs tablet Commonly known as:  ELIQUIS Take 1 tablet (5 mg total) by mouth 2 (two) times daily.   diltiazem 120 MG 24 hr capsule Commonly known as:  CARTIA XT Take 1 capsule (120 mg total) by mouth daily.   furosemide 40 MG tablet Commonly known as:  LASIX Take 1 tablet (40 mg total) by mouth daily. What changed:  when to take this   metoprolol 100 MG tablet Commonly known as:  LOPRESSOR Take 1 tablet (100 mg total) by mouth 2 (two) times daily.      Follow-up Information    Gaye AlkenBARNES,ELIZABETH STEWART, MD Follow up in 1 week(s).   Specialty:  Family Medicine Why:  patient and family agreeable with eliquis and this was started inpatient-- 30 day coupon card given Contact information: [address] [place] [address] [phone]            Time coordinating discharge: 35 min  Signed:  Samarth Ogle Juanetta GoslingU Tasmine Hipwell   [facility] 11/29/2016, 12:31 PM

## 2016-11-29 NOTE — Progress Notes (Signed)
Physical Therapy Treatment Patient Details Name: Joanna [name] MRN: [id number] DOB: 24-Jan-1941 Today's Date: 11/29/2016    History of Present Illness Pt is a 76 y.o. female who presented to the ED following a fall due to feeling weak. She also presented with slow and slurred speech. She denies new numbness or weakness but has R swhoulder pain and weakness at baseline. CT negative and MRI revealing no acute abnormality but old L corona radiata infarct and chronic microvascular ischemia. She has a PMH significant for chronic afib on coumadin, CHF, CKD, stroke, dysnomia, hypertension, ischemic bowel disease, acute renal failure, aphasia, left ventricular dysfunction, and obesity.    PT Comments    Patient progressing with gait, but remains fall risk on DGI (score 15/24, <19 demonstrate fall risk for community ambulators.)  Feel she would benefit from outpatient PT for balance and gait training.  Noted slow gait speed likely also in fall risk category.  Will continue to follow acutely.  Follow Up Recommendations  Outpatient PT     Equipment Recommendations  Cane    Recommendations for Other Services       Precautions / Restrictions Precautions Precautions: Fall    Mobility  Bed Mobility         Supine to sit: Modified independent (Device/Increase time)     General bed mobility comments: increased time, UE support  Transfers Overall transfer level: Needs assistance   Transfers: Sit to/from Stand Sit to Stand: Supervision         General transfer comment: slow and UE use needed  Ambulation/Gait Ambulation/Gait assistance: Supervision Ambulation Distance (Feet): 250 Feet Assistive device: None Gait Pattern/deviations: Step-through pattern;Decreased stride length;Trunk flexed;Wide base of support;Shuffle Gait velocity: slowed   General Gait Details: wearing house slippers so shuffling some due to that. note flexed and occasionally with hands behind her  back   Stairs Stairs: Yes   Stair Management: Two rails;Forwards;Alternating pattern Number of Stairs: 2 General stair comments: uses railings and slow pace  Wheelchair Mobility    Modified Rankin (Stroke Patients Only)       Balance     11/29/16 1222  Standardized Balance Assessment  Standardized Balance Assessment  Dynamic Gait Index  Dynamic Gait Index  Level Surface 2  Change in Gait Speed 1  Gait with Horizontal Head Turns 2  Gait with Vertical Head Turns 3  Gait and Pivot Turn 2  Step Over Obstacle 1  Step Around Obstacles 2  Steps 2  Total Score 15                                    Cognition Arousal/Alertness: Awake/alert Behavior During Therapy: WFL for tasks assessed/performed Overall Cognitive Status: No family/caregiver present to determine baseline cognitive functioning Area of Impairment: Attention;Problem solving   Current Attention Level: Sustained         Problem Solving: Slow processing      Exercises      General Comments        Pertinent Vitals/Pain Pain Assessment: No/denies pain    Home Living                      Prior Function            PT Goals (current goals can now be found in the care plan section) Progress towards PT goals: Progressing toward goals    Frequency    Min  3X/week      PT Plan Current plan remains appropriate    Co-evaluation             End of Session Equipment Utilized During Treatment: Gait belt Activity Tolerance: Patient tolerated treatment well Patient left: in chair;with chair alarm set;with call bell/phone within reach     Time: 1135-1153 PT Time Calculation (min) (ACUTE ONLY): 18 min  Charges:  $Gait Training: 8-22 mins                    G Codes:      Elray Mcgregor 13-Dec-2016, 12:26 PM  Sheran Lawless, PT [phone] December 13, 2016

## 2016-11-29 NOTE — Discharge Instructions (Addendum)

## 2016-12-05 ENCOUNTER — Other Ambulatory Visit: Payer: Self-pay | Admitting: *Deleted

## 2016-12-13 ENCOUNTER — Telehealth: Payer: Self-pay | Admitting: Nurse Practitioner

## 2016-12-13 NOTE — Telephone Encounter (Signed)
New message      Pt is on eliquis.  Dr Zachery DauerBarnes want to talk to Lawson FiscalLori about stopping eliquis and restarting coumadin.  Please call

## 2016-12-13 NOTE — Telephone Encounter (Signed)
Agree- ok to switch back to coumadin with higher target range.  Joanna Gundrum SwazilandJordan MD, [facility]

## 2016-12-13 NOTE — Telephone Encounter (Signed)
Called twice no answer on physicians line.

## 2016-12-13 NOTE — Telephone Encounter (Signed)
Spoke with pt. States she does not like Eliquis and it is upsetting her stomach. Offered Xarelto as an alternative but pt declined. She would like to switch back to warfarin because she did not have any problems on it. Suspected TIA while INR was subtherapeutic at 1.8 - may need higher INR range 2.5-3.5. Will discuss with Dr SwazilandJordan to see his thoughts on increasing INR range.  In the mean time, will have pt overlap warfarin and Eliquis for 3 days, then pt will just remain on warfarin. Follow up with INR check on Monday 2/5.

## 2016-12-13 NOTE — Telephone Encounter (Signed)
LMTCB about below will plan to schedule on pharmacist schedule to discuss Xarelto vs. Coumadin given history of stroke.

## 2016-12-13 NOTE — Telephone Encounter (Signed)
I have spoken to Dr. Zachery DauerBarnes' office staff. Ms. Earlene PlaterDavis wishes to go back on Warfarin. Does not like taking Eliquis and is not taking properly. Only taking Eliquis once a day.  Dr. Zachery DauerBarnes wishes for the coumadin clinic to restart her.   I have suggested possible Xarelto as an option. Will see if the pharmacist here will see her to discuss.

## 2016-12-19 ENCOUNTER — Ambulatory Visit (INDEPENDENT_AMBULATORY_CARE_PROVIDER_SITE_OTHER): Payer: Medicare HMO

## 2016-12-19 DIAGNOSIS — I4891 Unspecified atrial fibrillation: Secondary | ICD-10-CM

## 2016-12-19 DIAGNOSIS — Z5181 Encounter for therapeutic drug level monitoring: Secondary | ICD-10-CM

## 2016-12-19 LAB — POCT INR: INR: 1.4

## 2016-12-22 NOTE — Progress Notes (Signed)
SLP Evaluation addendum Late entry for 11/28/16 G-codes     SLP G-Codes **NOT FOR INPATIENT CLASS**  Functional Assessment Tool Used Skilled clinical observation  Functional Limitations Attention  Attention Current Status (G9562(G9165) CI  Attention Goal Status (Z3086(G9166) CI  Attention Discharge Status (V7846(G9167) CI

## 2016-12-28 ENCOUNTER — Ambulatory Visit (INDEPENDENT_AMBULATORY_CARE_PROVIDER_SITE_OTHER): Payer: Self-pay | Admitting: *Deleted

## 2016-12-28 DIAGNOSIS — I4891 Unspecified atrial fibrillation: Secondary | ICD-10-CM

## 2016-12-28 DIAGNOSIS — Z5181 Encounter for therapeutic drug level monitoring: Secondary | ICD-10-CM

## 2016-12-28 LAB — POCT INR: INR: 2.6

## 2017-01-12 ENCOUNTER — Ambulatory Visit (INDEPENDENT_AMBULATORY_CARE_PROVIDER_SITE_OTHER): Payer: Medicare HMO | Admitting: *Deleted

## 2017-01-12 DIAGNOSIS — Z5181 Encounter for therapeutic drug level monitoring: Secondary | ICD-10-CM | POA: Diagnosis not present

## 2017-01-12 DIAGNOSIS — I4891 Unspecified atrial fibrillation: Secondary | ICD-10-CM | POA: Diagnosis not present

## 2017-01-12 LAB — POCT INR: INR: 2.7

## 2017-02-21 ENCOUNTER — Other Ambulatory Visit: Payer: Self-pay | Admitting: Cardiology

## 2017-04-17 ENCOUNTER — Ambulatory Visit (INDEPENDENT_AMBULATORY_CARE_PROVIDER_SITE_OTHER): Payer: Medicare HMO | Admitting: *Deleted

## 2017-04-17 DIAGNOSIS — I4891 Unspecified atrial fibrillation: Secondary | ICD-10-CM

## 2017-04-17 DIAGNOSIS — Z5181 Encounter for therapeutic drug level monitoring: Secondary | ICD-10-CM | POA: Diagnosis not present

## 2017-04-17 LAB — POCT INR: INR: 2.8

## 2017-04-28 ENCOUNTER — Other Ambulatory Visit: Payer: Self-pay | Admitting: Nurse Practitioner

## 2017-05-29 ENCOUNTER — Other Ambulatory Visit: Payer: Self-pay | Admitting: Cardiology

## 2017-05-29 NOTE — Telephone Encounter (Signed)
Missed appointment with coumadin clinic 05/29/17

## 2017-06-14 ENCOUNTER — Ambulatory Visit (INDEPENDENT_AMBULATORY_CARE_PROVIDER_SITE_OTHER): Payer: Medicare HMO | Admitting: *Deleted

## 2017-06-14 DIAGNOSIS — I4891 Unspecified atrial fibrillation: Secondary | ICD-10-CM

## 2017-06-14 DIAGNOSIS — Z5181 Encounter for therapeutic drug level monitoring: Secondary | ICD-10-CM

## 2017-06-14 LAB — POCT INR: INR: 4

## 2017-06-30 ENCOUNTER — Other Ambulatory Visit: Payer: Self-pay

## 2017-06-30 MED ORDER — WARFARIN SODIUM 5 MG PO TABS: ORAL_TABLET | ORAL | 0 refills | Status: DC

## 2017-07-03 ENCOUNTER — Ambulatory Visit (INDEPENDENT_AMBULATORY_CARE_PROVIDER_SITE_OTHER): Payer: Medicare HMO | Admitting: *Deleted

## 2017-07-03 DIAGNOSIS — I4891 Unspecified atrial fibrillation: Secondary | ICD-10-CM | POA: Diagnosis not present

## 2017-07-03 DIAGNOSIS — Z5181 Encounter for therapeutic drug level monitoring: Secondary | ICD-10-CM | POA: Diagnosis not present

## 2017-07-03 LAB — POCT INR: INR: 2.3

## 2017-07-26 ENCOUNTER — Other Ambulatory Visit: Payer: Self-pay | Admitting: Cardiology

## 2017-10-16 ENCOUNTER — Telehealth: Payer: Self-pay | Admitting: Cardiology

## 2017-10-16 NOTE — Telephone Encounter (Signed)
No DPR on file for this individual. Pt's home line rings w/ no answer or VM pickup.

## 2017-10-16 NOTE — Telephone Encounter (Signed)
New Message      Pt c/o swelling: STAT is pt has developed SOB within 24 hours  How much weight have you gained and in what time span?  no 1) If swelling, where is the swelling located? Her legs   2) Are you currently taking a fluid pill? yes  3) Are you currently SOB?  Yes   4) Do you have a log of your daily weights (if so, list)? no  5) Have you gained 3 pounds in a day or 5 pounds in a week? no  6) Have you traveled recently? No   Patient is also having a problem with incontinence

## 2017-10-17 ENCOUNTER — Ambulatory Visit (INDEPENDENT_AMBULATORY_CARE_PROVIDER_SITE_OTHER): Payer: Medicare HMO

## 2017-10-17 DIAGNOSIS — Z5181 Encounter for therapeutic drug level monitoring: Secondary | ICD-10-CM | POA: Diagnosis not present

## 2017-10-17 DIAGNOSIS — I4891 Unspecified atrial fibrillation: Secondary | ICD-10-CM | POA: Diagnosis not present

## 2017-10-17 LAB — POCT INR: INR: 2.8

## 2017-10-17 NOTE — Patient Instructions (Signed)
Continue on same dosage 1 tablet everyday except 1.5 tablets on Mondays and Fridays. Recheck INR in 4 weeks.  Coumadin Clinic  [phone].

## 2017-10-18 NOTE — Telephone Encounter (Signed)
Spoke w patient. She voices no acute concerns, has questions she wishes to address at appt next week w Dr. SwazilandJordan - yearly f/u. I verified she knew [facility] office location (has seen Dr. SwazilandJordan in the past but has more recently been followed by Lawson FiscalLori at [facility] office). She voiced thanks for call & is aware to call back if questions.

## 2017-10-25 ENCOUNTER — Ambulatory Visit: Payer: Medicare HMO | Admitting: Cardiology

## 2017-10-27 ENCOUNTER — Other Ambulatory Visit: Payer: Self-pay | Admitting: Cardiology

## 2017-10-27 ENCOUNTER — Other Ambulatory Visit: Payer: Self-pay | Admitting: Nurse Practitioner

## 2017-10-27 NOTE — Telephone Encounter (Signed)
Please advise for refill, thanks 

## 2017-11-30 ENCOUNTER — Other Ambulatory Visit: Payer: Self-pay | Admitting: Nurse Practitioner

## 2017-12-01 ENCOUNTER — Other Ambulatory Visit: Payer: Self-pay

## 2017-12-01 MED ORDER — METOPROLOL TARTRATE 100 MG PO TABS: 100.0000 mg | ORAL_TABLET | Freq: Two times a day (BID) | ORAL | 0 refills | Status: DC

## 2017-12-12 ENCOUNTER — Telehealth: Payer: Self-pay | Admitting: Nurse Practitioner

## 2017-12-12 MED ORDER — METOPROLOL TARTRATE 100 MG PO TABS: 100.0000 mg | ORAL_TABLET | Freq: Two times a day (BID) | ORAL | 0 refills | Status: DC

## 2017-12-12 MED ORDER — DILTIAZEM HCL ER COATED BEADS 120 MG PO CP24: ORAL_CAPSULE | ORAL | 0 refills | Status: DC

## 2017-12-12 NOTE — Telephone Encounter (Signed)
Pt's medication has already been taken care of. Confirmation received.

## 2017-12-12 NOTE — Telephone Encounter (Signed)
c 

## 2017-12-12 NOTE — Telephone Encounter (Signed)
°*  STAT* If patient is at the pharmacy, call can be transferred to refill team.   1. Which medications need to be refilled? (please list name of each medication and dose if known) Metoprolol Tartrate 100 mg, Diltiazem 120 mg  2. Which pharmacy/location (including street and city if local pharmacy) is medication to be sent to?[facility] [place] [id number]  3. Do they need a 30 day or 90 day supply? 90  Patient has an appointment with Norma FredricksonLori Gerhardt 01-10-18 @3 :30pm

## 2017-12-12 NOTE — Telephone Encounter (Signed)
°*  STAT* If patient is at the pharmacy, call can be transferred to refill team.   1. Which medications need to be refilled? (please list name of each medication and dose if known) Metoprolol Tartrate 100 mg and Diltiazem 120 mg  2. Which pharmacy/location (including street and city if local pharmacy) is medication to be sent to?[facility] [place] - [id number]  3. Do they need a 30 day or 90 day supply? 90  Patient has an appointment on 01-10-18 @3 :30pm

## 2017-12-12 NOTE — Telephone Encounter (Signed)
Pt's medication was sent to pt's pharmacy as requested. Confirmation received.  °

## 2017-12-18 ENCOUNTER — Encounter (INDEPENDENT_AMBULATORY_CARE_PROVIDER_SITE_OTHER): Payer: Self-pay

## 2017-12-18 ENCOUNTER — Ambulatory Visit (INDEPENDENT_AMBULATORY_CARE_PROVIDER_SITE_OTHER): Payer: Medicare HMO | Admitting: *Deleted

## 2017-12-18 DIAGNOSIS — I482 Chronic atrial fibrillation, unspecified: Secondary | ICD-10-CM

## 2017-12-18 DIAGNOSIS — I63512 Cerebral infarction due to unspecified occlusion or stenosis of left middle cerebral artery: Secondary | ICD-10-CM

## 2017-12-18 DIAGNOSIS — I4891 Unspecified atrial fibrillation: Secondary | ICD-10-CM

## 2017-12-18 DIAGNOSIS — Z5181 Encounter for therapeutic drug level monitoring: Secondary | ICD-10-CM | POA: Diagnosis not present

## 2017-12-18 LAB — POCT INR: INR: 2.3

## 2017-12-18 NOTE — Patient Instructions (Signed)
Description   Today Feb 4th take 2 tablets (10mg ) then continue on same dosage 1 tablet everyday except 1.5 tablets on Mondays and Fridays. Recheck INR in 3 weeks.with time of visit with Norma FredricksonLori Gerhardt  Call  Coumadin Clinic  #[phone].with any changes

## 2018-01-01 ENCOUNTER — Other Ambulatory Visit: Payer: Self-pay | Admitting: Nurse Practitioner

## 2018-01-10 ENCOUNTER — Ambulatory Visit (INDEPENDENT_AMBULATORY_CARE_PROVIDER_SITE_OTHER): Payer: Medicare HMO | Admitting: *Deleted

## 2018-01-10 ENCOUNTER — Encounter: Payer: Self-pay | Admitting: Nurse Practitioner

## 2018-01-10 ENCOUNTER — Ambulatory Visit: Payer: Medicare HMO | Admitting: Nurse Practitioner

## 2018-01-10 VITALS — BP 118/70 | HR 60 | Ht 63.0 in | Wt 174.4 lb

## 2018-01-10 DIAGNOSIS — R350 Frequency of micturition: Secondary | ICD-10-CM

## 2018-01-10 DIAGNOSIS — I4891 Unspecified atrial fibrillation: Secondary | ICD-10-CM | POA: Diagnosis not present

## 2018-01-10 DIAGNOSIS — I481 Persistent atrial fibrillation: Secondary | ICD-10-CM

## 2018-01-10 DIAGNOSIS — Z5181 Encounter for therapeutic drug level monitoring: Secondary | ICD-10-CM | POA: Diagnosis not present

## 2018-01-10 DIAGNOSIS — Z79899 Other long term (current) drug therapy: Secondary | ICD-10-CM | POA: Diagnosis not present

## 2018-01-10 DIAGNOSIS — I4819 Other persistent atrial fibrillation: Secondary | ICD-10-CM

## 2018-01-10 LAB — POCT INR: INR: 2.2

## 2018-01-10 NOTE — Patient Instructions (Addendum)
We will be checking the following labs today - BMET, CBC, HPF, TSH and UA   Medication Instructions:    Continue with your current medicines.     Testing/Procedures To Be Arranged:  N/A  Follow-Up:   See me in 6 months    Other Special Instructions:   N/A    If you need a refill on your cardiac medications before your next appointment, please call your pharmacy.   [facility] office at [phone] if you have any questions, problems or concerns.

## 2018-01-10 NOTE — Patient Instructions (Signed)
Description   Today take 1.5 tablets then start taking 1 tablet everyday except 1.5 tablets on Mondays, Wednesdays, and Fridays. Recheck INR in 2 weeks.  Call Coumadin Clinic  #[phone].with any changes

## 2018-01-10 NOTE — Progress Notes (Signed)
CARDIOLOGY OFFICE NOTE  Date:  01/10/2018    Joanna [name] Date of Birth: [birth year] Medical Record #[id number]  PCP:  Juluis Rainier, MD  Cardiologist:  Meiya Wisler & [place]    Chief Complaint  Patient presents with  . Atrial Fibrillation    Follow up visit - seen for Dr. [place]    History of Present Illness: Joanna [name] is a 77 y.o. female who presents today for a follow up visit. Former patient of Dr. Yevonne Pax and Jordan's. She now follows with me. Did not wish to transition to the [employer].   She has a past history of chronic permanent atrial fibrillation. She is a long-term warfarin. She's had a prior history of a middle cerebral artery CVA in 2012. She retired from work after that stroke. 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 Other issues include HTN, prior LV dysfunction.   Admitted back in December of 2016 with recurrent stroke. Discharge summary reviewed - apparently did not wish to switch to DOAC. She remains on coumadin. Echo was updated again. Noted to be resistant to allowing family support.   I last saw her last January of 2018 - she was doing well.   Comes back today. Here alone today. She says she is doing well. No pain. No chest pain. Breathing is good. Sounds like she has not gone to see PCP "in some time". Her only issue is urinary frequency. Tolerating her coumadin. No falls. No bleeding or excessive bruising. She feels like she is doing ok overall. She has lost 14 pounds since I last saw her - she has not really been trying - says she eats less.   Past Medical History:  Diagnosis Date  . Acute renal failure (HCC) 02/23/2007-03/22/2007   Now with normalization back to baseline  . Aphasia 2012  . CHF (congestive heart failure) (HCC)   . Chronic atrial fibrillation (HCC)   . Chronic kidney disease   . Diverticulosis   . Dysnomia 12/2010  . Hypertension   . Ischemic bowel  disease (HCC)    With GI bleeding  . Left ventricular dysfunction    EF is now 50-55% as of 2012  . Obesity   . Splenic infarct   . Stroke [facility]) February 2012   Now back on coumadin    Past Surgical History:  Procedure Laterality Date  . COLECTOMY  12/2010   partial  . ILEOSTOMY    . OMENTECTOMY     partial with ileostomy     Medications: Current Meds  Medication Sig  . diltiazem (CARTIA XT) 120 MG 24 hr capsule Take one capsule by mouth once daily. Please keep upcoming appt for future refills. Thank you  . furosemide (LASIX) 40 MG tablet Take 40 mg by mouth every other day.  . metoprolol tartrate (LOPRESSOR) 100 MG tablet TAKE 1 TABLET BY MOUTH TWICE DAILY (PLEASE  KEEP  UPCOMING  APPT  FOR  FUTER  REFILLS)  . warfarin (COUMADIN) 5 MG tablet TAKE 1 TO 1 & 1/2 (ONE TO ONE & ONE-HALF) TABLETS BY MOUTH ONCE DAILY AS  DIRECTED  BY  COUMADIN  CLINIC  . [DISCONTINUED] furosemide (LASIX) 40 MG tablet Take 1 tablet (40 mg total) by mouth daily. (Patient taking differently: Take 40 mg by mouth every other day. )     Allergies: No Known Allergies  Social History: The patient  reports that  has never smoked. she  has never used smokeless tobacco. She reports that she does not drink alcohol or use drugs.   Family History: [name] family history includes Heart disease in her mother; Hypertension in her sister.   Review of Systems: Please see the history of present illness.   Otherwise, the review of systems is positive for none.   All other systems are reviewed and negative.   Physical Exam: VS:  BP 118/70 (BP Location: Left Arm, Patient Position: Sitting, Cuff Size: Normal)   Pulse 60   Ht 5\' 3"  (1.6 m)   Wt 174 lb 6.4 oz (79.1 kg)   BMI 30.89 kg/m  .  BMI Body mass index is 30.89 kg/m.  Wt Readings from Last 3 Encounters:  01/10/18 174 lb 6.4 oz (79.1 kg)  11/27/16 181 lb 3.5 oz (82.2 kg)  11/15/16 188 lb 1.9 oz (85.3 kg)    General: Pleasant. Well developed, well  nourished and in no acute distress.   HEENT: Normal.  Neck: Supple, no JVD, carotid bruits, or masses noted.  Cardiac: Irregular irregular rhythm. Rate is ok. No murmurs, rubs, or gallops. No edema.  Respiratory:  Lungs are clear to auscultation bilaterally with normal work of breathing.  GI: Soft and nontender.  MS: No deformity or atrophy. Gait and ROM intact.  Skin: Warm and dry. Color is normal.  Neuro:  Strength and sensation are intact and no gross focal deficits noted.  Psych: Alert, appropriate and with normal affect.   LABORATORY DATA:  EKG:  EKG is ordered today. This demonstrates atrial fibrillation with controlled VR. Her rate is 60. Diffuse T wave changes. Unchanged.   Lab Results  Component Value Date   WBC 9.4 11/29/2016   HGB 13.3 11/29/2016   HCT 41.2 11/29/2016   PLT 164 11/29/2016   GLUCOSE 104 (H) 11/27/2016   CHOL 120 11/28/2016   TRIG 51 11/28/2016   HDL 46 11/28/2016   LDLCALC 64 11/28/2016   ALT 14 10/27/2015   AST 33 10/27/2015   NA 137 11/27/2016   K 4.0 11/27/2016   CL 105 11/27/2016   CREATININE 1.09 (H) 11/27/2016   BUN 13 11/27/2016   CO2 21 (L) 11/27/2016   TSH 2.781 12/26/2010   INR 2.2 01/10/2018   HGBA1C 5.9 (H) 11/28/2016     Lab Results  Component Value Date   INR 2.2 01/10/2018   INR 2.3 12/18/2017   INR 2.8 10/17/2017     BNP (last 3 results) No results for input(s): BNP in the last 8760 hours.  ProBNP (last 3 results) No results for input(s): PROBNP in the last 8760 hours.   Other Studies Reviewed Today:  Echo Study Conclusions from 10/2015  - Left ventricle: The cavity size was normal. Systolic function was  normal. The estimated ejection fraction was in the range of 50%  to 55%. Wall motion was normal; there were no regional wall  motion abnormalities. - Aortic valve: Trileaflet; mildly thickened, mildly calcified  leaflets. - Mitral valve: Calcified annulus. There was mild to moderate  regurgitation. -  Left atrium: The atrium was severely dilated. - Right ventricle: The cavity size was mildly dilated. Wall  thickness was normal. Systolic function was mildly reduced. - Right atrium: The atrium was massively dilated. - Tricuspid valve: There was severe regurgitation. - Pulmonary arteries: PA peak pressure: 56 mm Hg (S).  Impressions:  - The right ventricular systolic pressure was increased consistent  with moderate pulmonary hypertension.  Assessment/Plan: 1. Permanent atrial fibrillation -  managed with rate control and anticoagulation. She is doing ok overall.   2. Prior CVA - refused switching to DOAC -  she wishes to stay on coumadin. Will recheck her labs today. She is doing ok.   3. HTN - BP looks great today - no changes made.   4. Prior left ventricular dysfunction but last recent echo showing ejection fraction of 50%. She has no symptoms.   5. Chronic combined systolic and diastolic heart failure - looks good clinically  6. Chronic anticoagulation - no problems noted. Rechecking her lab today  7. Weight loss - needs labs  8. Urinary frequency - will check UA - I suspect OAB - encouraged her to discuss with PCP.   Current medicines are reviewed with the patient today.  The patient does not have concerns regarding medicines other than what has been noted above.  The following changes have been made:  See above.  Labs/ tests ordered today include:    Orders Placed This Encounter  Procedures  . Basic metabolic panel  . CBC  . TSH  . Hepatic function panel  . Urinalysis, Routine w reflex microscopic  . EKG 12-Lead     Disposition:   FU with me in 6 months.   Patient is agreeable to this plan and will call if any problems develop in the interim.   SignedNorma Fredrickson, NP  01/10/2018 4:09 PM  [facility] [address] [place], [place]  [address] Phone: [phone] Fax: [phone]

## 2018-01-11 LAB — CBC
Hematocrit: 42.5 % (ref 34.0–46.6)
Hemoglobin: 14.3 g/dL (ref 11.1–15.9)
MCH: 33.1 pg — ABNORMAL HIGH (ref 26.6–33.0)
MCHC: 33.6 g/dL (ref 31.5–35.7)
MCV: 98 fL — ABNORMAL HIGH (ref 79–97)
Platelets: 165 10*3/uL (ref 150–379)
RBC: 4.32 x10E6/uL (ref 3.77–5.28)
RDW: 14.1 % (ref 12.3–15.4)
WBC: 3.9 10*3/uL (ref 3.4–10.8)

## 2018-01-11 LAB — HEPATIC FUNCTION PANEL
ALT: 10 IU/L (ref 0–32)
AST: 24 IU/L (ref 0–40)
Albumin: 4.1 g/dL (ref 3.5–4.8)
Alkaline Phosphatase: 214 IU/L — ABNORMAL HIGH (ref 39–117)
Bilirubin Total: 1.3 mg/dL — ABNORMAL HIGH (ref 0.0–1.2)
Bilirubin, Direct: 0.79 mg/dL — ABNORMAL HIGH (ref 0.00–0.40)
Total Protein: 8.1 g/dL (ref 6.0–8.5)

## 2018-01-11 LAB — BASIC METABOLIC PANEL
BUN/Creatinine Ratio: 14 (ref 12–28)
BUN: 15 mg/dL (ref 8–27)
CO2: 20 mmol/L (ref 20–29)
Calcium: 9.1 mg/dL (ref 8.7–10.3)
Chloride: 105 mmol/L (ref 96–106)
Creatinine, Ser: 1.08 mg/dL — ABNORMAL HIGH (ref 0.57–1.00)
GFR calc Af Amer: 57 mL/min/{1.73_m2} — ABNORMAL LOW (ref >59)
GFR calc non Af Amer: 50 mL/min/{1.73_m2} — ABNORMAL LOW (ref >59)
Glucose: 93 mg/dL (ref 65–99)
Potassium: 3.9 mmol/L (ref 3.5–5.2)
Sodium: 143 mmol/L (ref 134–144)

## 2018-01-11 LAB — TSH: TSH: 5.72 u[IU]/mL — ABNORMAL HIGH (ref 0.450–4.500)

## 2018-01-15 ENCOUNTER — Other Ambulatory Visit: Payer: Self-pay | Admitting: *Deleted

## 2018-01-15 DIAGNOSIS — I10 Essential (primary) hypertension: Secondary | ICD-10-CM

## 2018-01-15 DIAGNOSIS — I482 Chronic atrial fibrillation, unspecified: Secondary | ICD-10-CM

## 2018-01-18 ENCOUNTER — Other Ambulatory Visit: Payer: Self-pay | Admitting: Cardiology

## 2018-01-18 ENCOUNTER — Other Ambulatory Visit: Payer: Self-pay | Admitting: Nurse Practitioner

## 2018-01-23 ENCOUNTER — Encounter: Payer: Self-pay | Admitting: *Deleted

## 2018-01-23 ENCOUNTER — Other Ambulatory Visit: Payer: Medicare HMO

## 2018-01-23 ENCOUNTER — Ambulatory Visit (INDEPENDENT_AMBULATORY_CARE_PROVIDER_SITE_OTHER): Payer: Medicare HMO | Admitting: *Deleted

## 2018-01-23 DIAGNOSIS — I63512 Cerebral infarction due to unspecified occlusion or stenosis of left middle cerebral artery: Secondary | ICD-10-CM | POA: Diagnosis not present

## 2018-01-23 DIAGNOSIS — I482 Chronic atrial fibrillation, unspecified: Secondary | ICD-10-CM

## 2018-01-23 DIAGNOSIS — Z5181 Encounter for therapeutic drug level monitoring: Secondary | ICD-10-CM

## 2018-01-23 DIAGNOSIS — I4891 Unspecified atrial fibrillation: Secondary | ICD-10-CM

## 2018-01-23 DIAGNOSIS — I10 Essential (primary) hypertension: Secondary | ICD-10-CM

## 2018-01-23 LAB — POCT INR: INR: 2.4

## 2018-01-23 NOTE — Patient Instructions (Signed)
Description   Today March 12th  take 1.5 tablets then do dose change as instructed on last visit 1 tablet everyday except 1.5 tablets on Mondays, Wednesdays, and Fridays. Recheck INR in 2 weeks.  Call [facility] Clinic  #[phone].with any changes

## 2018-01-24 ENCOUNTER — Other Ambulatory Visit: Payer: Medicare HMO

## 2018-01-25 LAB — TSH: TSH: 4.63 u[IU]/mL — ABNORMAL HIGH (ref 0.450–4.500)

## 2018-01-25 LAB — HEPATIC FUNCTION PANEL
ALT: 9 IU/L (ref 0–32)
AST: 22 IU/L (ref 0–40)
Albumin: 3.9 g/dL (ref 3.5–4.8)
Alkaline Phosphatase: 202 IU/L — ABNORMAL HIGH (ref 39–117)
Bilirubin Total: 1.2 mg/dL (ref 0.0–1.2)
Bilirubin, Direct: 0.64 mg/dL — ABNORMAL HIGH (ref 0.00–0.40)
Total Protein: 7.8 g/dL (ref 6.0–8.5)

## 2018-02-06 ENCOUNTER — Ambulatory Visit (INDEPENDENT_AMBULATORY_CARE_PROVIDER_SITE_OTHER): Payer: Medicare HMO | Admitting: *Deleted

## 2018-02-06 DIAGNOSIS — I4891 Unspecified atrial fibrillation: Secondary | ICD-10-CM | POA: Diagnosis not present

## 2018-02-06 DIAGNOSIS — Z5181 Encounter for therapeutic drug level monitoring: Secondary | ICD-10-CM | POA: Diagnosis not present

## 2018-02-06 LAB — POCT INR: INR: 2.6

## 2018-02-06 NOTE — Patient Instructions (Signed)
Description   Continue taking 1 tablet everyday except 1.5 tablets on Mondays, Wednesdays, and Fridays. Recheck INR in 3 weeks.  Call [facility]in Clinic  #[phone].with any changes

## 2018-02-14 ENCOUNTER — Other Ambulatory Visit: Payer: Self-pay | Admitting: Nurse Practitioner

## 2018-04-29 ENCOUNTER — Other Ambulatory Visit: Payer: Self-pay | Admitting: Cardiology

## 2018-04-30 NOTE — Telephone Encounter (Signed)
[address] patient 

## 2018-05-01 ENCOUNTER — Ambulatory Visit: Payer: Medicare HMO | Admitting: *Deleted

## 2018-05-01 DIAGNOSIS — Z5181 Encounter for therapeutic drug level monitoring: Secondary | ICD-10-CM | POA: Diagnosis not present

## 2018-05-01 DIAGNOSIS — I4891 Unspecified atrial fibrillation: Secondary | ICD-10-CM

## 2018-05-01 LAB — POCT INR: INR: 3 (ref 2.0–3.0)

## 2018-05-01 NOTE — Patient Instructions (Signed)
Description   Continue taking 1 tablet everyday except 1.5 tablets on Mondays, Wednesdays, and Fridays. Recheck INR in 4 weeks.  Call [facility] Clinic  #[phone] with any changes     

## 2018-05-10 ENCOUNTER — Encounter: Payer: Medicare Other | Admitting: Podiatry

## 2018-05-13 NOTE — Progress Notes (Signed)
Erroneous encounter

## 2018-05-31 ENCOUNTER — Other Ambulatory Visit: Payer: Self-pay | Admitting: Cardiology

## 2018-06-01 NOTE — Telephone Encounter (Signed)
Warfarin 

## 2018-06-18 ENCOUNTER — Ambulatory Visit: Payer: Medicare HMO | Admitting: *Deleted

## 2018-06-18 DIAGNOSIS — I482 Chronic atrial fibrillation, unspecified: Secondary | ICD-10-CM

## 2018-06-18 DIAGNOSIS — Z5181 Encounter for therapeutic drug level monitoring: Secondary | ICD-10-CM | POA: Diagnosis not present

## 2018-06-18 DIAGNOSIS — I4891 Unspecified atrial fibrillation: Secondary | ICD-10-CM | POA: Diagnosis not present

## 2018-06-18 LAB — POCT INR: INR: 2.5 (ref 2.0–3.0)

## 2018-06-18 NOTE — Patient Instructions (Signed)
Description   Continue taking 1 tablet everyday except 1.5 tablets on Mondays, Wednesdays, and Fridays. Recheck INR in 2 weeks.  Call [facility]in Clinic  #[phone] with any changes

## 2018-07-02 ENCOUNTER — Encounter (HOSPITAL_COMMUNITY): Payer: Self-pay | Admitting: Emergency Medicine

## 2018-07-02 ENCOUNTER — Other Ambulatory Visit: Payer: Self-pay

## 2018-07-02 ENCOUNTER — Ambulatory Visit (HOSPITAL_COMMUNITY)
Admission: EM | Admit: 2018-07-02 | Discharge: 2018-07-02 | Disposition: A | Discharge status: Home / Self Care | Payer: Medicare HMO | Attending: Family Medicine | Admitting: Family Medicine

## 2018-07-02 DIAGNOSIS — Z79899 Other long term (current) drug therapy: Secondary | ICD-10-CM | POA: Insufficient documentation

## 2018-07-02 DIAGNOSIS — R638 Other symptoms and signs concerning food and fluid intake: Secondary | ICD-10-CM | POA: Diagnosis not present

## 2018-07-02 DIAGNOSIS — Z8673 Personal history of transient ischemic attack (TIA), and cerebral infarction without residual deficits: Secondary | ICD-10-CM | POA: Insufficient documentation

## 2018-07-02 DIAGNOSIS — R8290 Unspecified abnormal findings in urine: Secondary | ICD-10-CM

## 2018-07-02 DIAGNOSIS — I509 Heart failure, unspecified: Secondary | ICD-10-CM | POA: Diagnosis not present

## 2018-07-02 DIAGNOSIS — N189 Chronic kidney disease, unspecified: Secondary | ICD-10-CM | POA: Diagnosis not present

## 2018-07-02 DIAGNOSIS — E669 Obesity, unspecified: Secondary | ICD-10-CM | POA: Diagnosis not present

## 2018-07-02 DIAGNOSIS — I130 Hypertensive heart and chronic kidney disease with heart failure and stage 1 through stage 4 chronic kidney disease, or unspecified chronic kidney disease: Secondary | ICD-10-CM | POA: Insufficient documentation

## 2018-07-02 DIAGNOSIS — R2681 Unsteadiness on feet: Secondary | ICD-10-CM | POA: Diagnosis not present

## 2018-07-02 DIAGNOSIS — Z7901 Long term (current) use of anticoagulants: Secondary | ICD-10-CM | POA: Insufficient documentation

## 2018-07-02 DIAGNOSIS — N390 Urinary tract infection, site not specified: Secondary | ICD-10-CM | POA: Insufficient documentation

## 2018-07-02 DIAGNOSIS — I482 Chronic atrial fibrillation: Secondary | ICD-10-CM | POA: Insufficient documentation

## 2018-07-02 LAB — POCT URINALYSIS DIP (DEVICE)
GLUCOSE, UA: NEGATIVE mg/dL
NITRITE: NEGATIVE
PROTEIN: 100 mg/dL — AB
Specific Gravity, Urine: >=1.03 (ref 1.005–1.030)
Urobilinogen, UA: 4 mg/dL — ABNORMAL HIGH (ref 0.0–1.0)
pH: 5 (ref 5.0–8.0)

## 2018-07-02 LAB — POCT I-STAT, CHEM 8
BUN: 15 mg/dL (ref 8–23)
CALCIUM ION: 1.2 mmol/L (ref 1.15–1.40)
CREATININE: 1 mg/dL (ref 0.44–1.00)
Chloride: 102 mmol/L (ref 98–111)
Glucose, Bld: 121 mg/dL — ABNORMAL HIGH (ref 70–99)
HEMATOCRIT: 46 % (ref 36.0–46.0)
Hemoglobin: 15.6 g/dL — ABNORMAL HIGH (ref 12.0–15.0)
Potassium: 3.9 mmol/L (ref 3.5–5.1)
SODIUM: 141 mmol/L (ref 135–145)
TCO2: 26 mmol/L (ref 22–32)

## 2018-07-02 MED ORDER — CEPHALEXIN 500 MG PO CAPS: 500.0000 mg | ORAL_CAPSULE | Freq: Four times a day (QID) | ORAL | 0 refills | Status: AC

## 2018-07-02 NOTE — ED Provider Notes (Signed)
[facility]    CSN: [id number] Arrival date & time: 07/02/18  1620     History   Chief Complaint Chief Complaint  Patient presents with  . Urinary Tract Infection    HPI Joanna [name] is a 77 y.o. female.   Yulanda presents with her daughter and POA with complaints of decreased balance, decreased appetite and decreased balance for the past month. Has had urinary frequency. No pain with urination. Slipped and slid to the floor on her bottom two weeks ago, denies head injury. Symptoms have not worsened since then. No nausea, vomiting or diarrhea. No cough or URI symptoms. Daughter states she may have noticed some slight increased forgetfulness. Does not walk with walker or cane. Denies any previous similar. Has afib and hx of stroke. Appointment with cardiology in two days. Follows with a PCP as well. She is on coumadin. INR 8/5 2.5. States has only been taking her lasix intermittently due to urinary symptoms. No abdominal or back pain.     ROS per HPI.      Past Medical History:  Diagnosis Date  . Acute renal failure (HCC) 02/23/2007-03/22/2007   Now with normalization back to baseline  . Aphasia 2012  . CHF (congestive heart failure) (HCC)   . Chronic atrial fibrillation (HCC)   . Chronic kidney disease   . Diverticulosis   . Dysnomia 12/2010  . Hypertension   . Ischemic bowel disease (HCC)    With GI bleeding  . Left ventricular dysfunction    EF is now 50-55% as of 2012  . Obesity   . Splenic infarct   . Stroke [facility]) February 2012   Now back on coumadin    Patient Active Problem List   Diagnosis Date Noted  . Stroke-like symptoms 11/27/2016  . Stroke (cerebrum) (HCC) 10/27/2015  . CVA (cerebral infarction) 10/26/2015  . Acute ischemic left MCA stroke (HCC) 10/26/2015  . Chronic atrial fibrillation (HCC) 10/26/2015  . Encounter for therapeutic drug monitoring 01/03/2014  . Left ventricular dysfunction   . Hypertension   . Atrial fibrillation  (HCC) 02/04/2011  . Stroke [facility]) 12/15/2010    Past Surgical History:  Procedure Laterality Date  . COLECTOMY  12/2010   partial  . ILEOSTOMY    . OMENTECTOMY     partial with ileostomy    OB History   None      Home Medications    Prior to Admission medications   Medication Sig Start Date End Date Taking? Authorizing Provider  cephALEXin (KEFLEX) 500 MG capsule Take 1 capsule (500 mg total) by mouth 4 (four) times daily for 7 days. 07/02/18 07/09/18  Georgetta Haber, NP  diltiazem (CARTIA XT) 120 MG 24 hr capsule Take 1 capsule (120 mg total) by mouth daily. 01/18/18   Rosalio Macadamia, NP  furosemide (LASIX) 40 MG tablet Take 40 mg by mouth every other day.    [provider]  metoprolol tartrate (LOPRESSOR) 100 MG tablet Take 1 tablet (100 mg total) by mouth 2 (two) times daily. 02/15/18   Rosalio Macadamia, NP  warfarin (COUMADIN) 5 MG tablet TAKE ONE TO ONE AND ONE-HALF TABLET BY MOUTH ONCE DAILY AS DIRECTED BY COUMADIN CLINIC 06/01/18   Rosalio Macadamia, NP    Family History Family History  Problem Relation Age of Onset  . Heart disease Mother   . Hypertension Sister     Social History Social History   Tobacco Use  . Smoking status: Never Smoker  .  Smokeless tobacco: Never Used  Substance Use Topics  . Alcohol use: No  . Drug use: No     Allergies   Patient has no known allergies.   Review of Systems Review of Systems   Physical Exam Triage Vital Signs ED Triage Vitals [07/02/18 1637]  Enc Vitals Group     BP (!) 145/78     Pulse Rate 76     Resp 18     Temp 98.2 F (36.8 C)     Temp Source Oral     SpO2 95 %     Weight      Height      Head Circumference      Peak Flow      Pain Score      Pain Loc      Pain Edu?      Excl. in GC?    No data found.  Updated Vital Signs BP (!) 145/78 (BP Location: Left Arm)   Pulse 76   Temp 98.2 F (36.8 C) (Oral)   Resp 18   SpO2 95%   Visual Acuity Right Eye Distance:   Left Eye  Distance:   Bilateral Distance:    Right Eye Near:   Left Eye Near:    Bilateral Near:     Physical Exam  Constitutional: She is oriented to person, place, and time. She appears well-developed and well-nourished. No distress.  HENT:  Head: Normocephalic and atraumatic.  Right Ear: External ear normal.  Left Ear: External ear normal.  Nose: Nose normal.  Mouth/Throat: Oropharynx is clear and moist.  Eyes: Pupils are equal, round, and reactive to light. Conjunctivae and EOM are normal.  Neck: Normal range of motion.  Cardiovascular: Normal rate and normal heart sounds. An irregularly irregular rhythm present.  Pulmonary/Chest: Effort normal and breath sounds normal. No respiratory distress.  Neurological: She is alert and oriented to person, place, and time. No cranial nerve deficit or sensory deficit. Coordination normal.  Patient, alert, oriented, interactive, appropriate; strength equal bilaterally; gross sensation intact; ambulatory cautiously with assist   Skin: Skin is warm and dry.     UC Treatments / Results  Labs (all labs ordered are listed, but only abnormal results are displayed) Labs Reviewed  POCT URINALYSIS DIP (DEVICE) - Abnormal; Notable for the following components:      Result Value   Bilirubin Urine SMALL (*)    Ketones, ur TRACE (*)    Hgb urine dipstick SMALL (*)    Protein, ur 100 (*)    Urobilinogen, UA 4.0 (*)    Leukocytes, UA TRACE (*)    All other components within normal limits  POCT I-STAT, CHEM 8 - Abnormal; Notable for the following components:   Glucose, Bld 121 (*)    Hemoglobin 15.6 (*)    All other components within normal limits  URINE CULTURE    EKG None  Radiology No results found.  Procedures Procedures (including critical care time)  Medications Ordered in UC Medications - No data to display  Initial Impression / Assessment and Plan / UC Course  I have reviewed the triage vital signs and the nursing notes.  Pertinent  labs & imaging results that were available during my care of the patient were reviewed by me and considered in my medical decision making (see chart for details).     Exam reassuring without any neurological findings. Alert, interactive, ambulatory. Chem 8 without acute findings, creatinine WNL at this time. Urine is concerning  for UTI, keflex initiated. Culture collected and pending. Encouraged close follow up with PCP in the next week. Notify INR team that on abx. Return precautions provided. Patient and daughter verbalized understanding and agreeable to plan.   Final Clinical Impressions(s) / UC Diagnoses   Final diagnoses:  Lower urinary tract infectious disease     Discharge Instructions     Your urine is concerning for infection so we will start antibiotics.  We will collect and run a urine culture as well to confirm this and to ensure adequate antibiotic coverage.  Please follow up with your primary care provider for recheck in the next week to ensure symptoms are improving.  Please also let INR team know you are on antibiotics as you may need this checked my frequently.  If develop increased confusion, weakness, fevers, headache, inability to speak, fall or head injury, or otherwise worsening please return or go to the Er.     ED Prescriptions    Medication Sig Dispense Auth. Provider   cephALEXin (KEFLEX) 500 MG capsule Take 1 capsule (500 mg total) by mouth 4 (four) times daily for 7 days. 28 capsule Georgetta HaberBurky, Latrell Reitan B, NP     Controlled Substance Prescriptions [place] Controlled Substance Registry consulted? Not Applicable   Georgetta HaberBurky, Luanne Krzyzanowski B, NP 07/02/18 1730

## 2018-07-02 NOTE — ED Triage Notes (Addendum)
Patient has a poor appetite, no pain with urination.  Urine is dark and has an odor.  Patient is off balance.  These symptoms have been going on for a month per family member

## 2018-07-02 NOTE — Discharge Instructions (Signed)
Your urine is concerning for infection so we will start antibiotics.  We will collect and run a urine culture as well to confirm this and to ensure adequate antibiotic coverage.  Please follow up with your primary care provider for recheck in the next week to ensure symptoms are improving.  Please also let INR team know you are on antibiotics as you may need this checked my frequently.  If develop increased confusion, weakness, fevers, headache, inability to speak, fall or head injury, or otherwise worsening please return or go to the Er.

## 2018-07-04 ENCOUNTER — Ambulatory Visit: Payer: Medicare HMO | Admitting: Nurse Practitioner

## 2018-07-04 NOTE — Progress Notes (Deleted)
CARDIOLOGY OFFICE NOTE  Date:  07/04/2018    Joanna [name] Date of Birth: 1941-06-28 Medical Record #[id number]  PCP:  [name], Elizabeth, MD  Cardiologist:  Jiselle Sheu & [place]    No chief complaint on file.   History of Present Illness: Joanna [name] is a 77 y.o. female who presents today for a follow up visit. Former patient of Dr. Pryor CuriaBrackbill'sand Jordan's.She now follows with me. Did notwish to transition to the [employer] line office.   She has a past history of chronic permanent atrial fibrillation. She is on long-term warfarin. She's had a prior history of a middle cerebral artery CVA in 2012. She retired from work after that stroke. 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 Other issues include HTN, prior LV dysfunction.   [facility]. Discharge summary reviewed - apparently did not wish to switch to DOAC. She remained on coumadin. Echo was updated again.Noted to beresistant to allowing family support.  I last saw her last in February of 2019 - she was doing well from our standpoint. She had lost some weight.   Comes back today. Herealone today.  Past Medical History:  Diagnosis Date  . Acute renal failure (HCC) 02/23/2007-03/22/2007   Now with normalization back to baseline  . Aphasia 2012  . CHF (congestive heart failure) (HCC)   . Chronic atrial fibrillation (HCC)   . Chronic kidney disease   . Diverticulosis   . Dysnomia 12/2010  . Hypertension   . Ischemic bowel disease (HCC)    With GI bleeding  . Left ventricular dysfunction    EF is now 50-55% as of 2012  . Obesity   . Splenic infarct   . Stroke [facility](H[facility]) February 2012   Now back on coumadin    Past Surgical History:  Procedure Laterality Date  . COLECTOMY  12/2010   partial  . ILEOSTOMY    . OMENTECTOMY     partial with ileostomy     Medications: No outpatient medications have been marked  as taking for the 07/04/18 encounter (Appointment) with Rosalio MacadamiaGerhardt, Suvan Stcyr C, NP.     Allergies: No Known Allergies  Social History: The patient  reports that she has never smoked. She has never used smokeless tobacco. She reports that she does not drink alcohol or use drugs.   Family History: [name] ***family history includes Heart disease in her mother; Hypertension in her sister.   Review of Systems: Please see the history of present illness.   Otherwise, the review of systems is positive for {NONE DEFAULTED:18576::"none"}.   All other systems are reviewed and negative.   Physical Exam: VS:  There were no vitals taken for this visit. Marland Kitchen.  BMI There is no height or weight on file to calculate BMI.  Wt Readings from Last 3 Encounters:  01/10/18 174 lb 6.4 oz (79.1 kg)  11/27/16 181 lb 3.5 oz (82.2 kg)  11/15/16 188 lb 1.9 oz (85.3 kg)    General: Pleasant. Well developed, well nourished and in no acute distress.   HEENT: Normal.  Neck: Supple, no JVD, carotid bruits, or masses noted.  Cardiac: ***Regular rate and rhythm. No murmurs, rubs, or gallops. No edema.  Respiratory:  Lungs are clear to auscultation bilaterally with normal work of breathing.  GI: Soft and nontender.  MS: No deformity or atrophy. Gait and ROM intact.  Skin: Warm and dry. Color is normal.  Neuro:  Strength and sensation are intact and no gross focal deficits noted.  Psych: Alert, appropriate and with normal affect.   LABORATORY DATA:  EKG:  EKG {ACTION; IS/IS [id number]}OT:[id number]} ordered today. This demonstrates ***.  Lab Results  Component Value Date   WBC 3.9 01/10/2018   HGB 15.6 (H) 07/02/2018   HCT 46.0 07/02/2018   PLT 165 01/10/2018   GLUCOSE 121 (H) 07/02/2018   CHOL 120 11/28/2016   TRIG 51 11/28/2016   HDL 46 11/28/2016   LDLCALC 64 11/28/2016   ALT 9 01/23/2018   AST 22 01/23/2018   NA 141 07/02/2018   K 3.9 07/02/2018   CL 102 07/02/2018   CREATININE 1.00 07/02/2018   BUN 15  07/02/2018   CO2 20 01/10/2018   TSH 4.630 (H) 01/23/2018   INR 2.5 06/18/2018   HGBA1C 5.9 (H) 11/28/2016     BNP (last 3 results) No results for input(s): BNP in the last 8760 hours.  ProBNP (last 3 results) No results for input(s): PROBNP in the last 8760 hours.   Other Studies Reviewed Today:   Assessment/Plan: Echo Study Conclusions from 10/2015  - Left ventricle: The cavity size was normal. Systolic function was  normal. The estimated ejection fraction was in the range of 50%  to 55%. Wall motion was normal; there were no regional wall  motion abnormalities. - Aortic valve: Trileaflet; mildly thickened, mildly calcified  leaflets. - Mitral valve: Calcified annulus. There was mild to moderate  regurgitation. - Left atrium: The atrium was severely dilated. - Right ventricle: The cavity size was mildly dilated. Wall  thickness was normal. Systolic function was mildly reduced. - Right atrium: The atrium was massively dilated. - Tricuspid valve: There was severe regurgitation. - Pulmonary arteries: PA peak pressure: 56 mm Hg (S).  Impressions:  - The right ventricular systolic pressure was increased consistent  with moderate pulmonary hypertension.  Assessment/Plan: 1. Permanent atrial fibrillation - managed with rate control and anticoagulation. She is doing ok overall.   2.PriorCVA - refused switching to DOAC - she wishes to stay on coumadin. Will recheck her labs today. She is doing ok.   3. HTN - BPlooks great today - no changes made.   4. Prior left ventricular dysfunction but last recent echo showing ejection fraction of 50%. She has no symptoms.   5. Chronic combined systolic and diastolic heart failure - looks good clinically  6. Chronic anticoagulation- no problems noted.Rechecking her lab today  7. Weight loss - needs labs  8. Urinary frequency - will check UA - I suspect OAB - encouraged her to discuss with PCP.   Current  medicines are reviewed with the patient today.  The patient does not have concerns regarding medicines other than what has been noted above.  The following changes have been made:  See above.  Labs/ tests ordered today include:   No orders of the defined types were placed in this encounter.    Disposition:   FU with *** in {gen number 5-78:[id number]}0-10:310397} {Days to years:10300}.   Patient is agreeable to this plan and will call if any problems develop in the interim.   SignedNorma Fredrickson: Willliam Pettet, NP  07/04/2018 1:23 PM  [facility] [address] [place], [place]  [id number] Phone: [phone] Fax: [phone]

## 2018-07-05 ENCOUNTER — Ambulatory Visit: Payer: Medicare HMO

## 2018-07-05 DIAGNOSIS — I4891 Unspecified atrial fibrillation: Secondary | ICD-10-CM

## 2018-07-05 DIAGNOSIS — Z5181 Encounter for therapeutic drug level monitoring: Secondary | ICD-10-CM

## 2018-07-05 LAB — URINE CULTURE

## 2018-07-05 LAB — POCT INR: INR: 3 (ref 2.0–3.0)

## 2018-07-05 NOTE — Patient Instructions (Signed)
Description   Continue taking 1 tablet everyday except 1.5 tablets on Mondays, Wednesdays, and Fridays. Recheck INR in 4 weeks.  Call [facility]  #[phone] with any changes

## 2018-07-06 ENCOUNTER — Other Ambulatory Visit: Payer: Self-pay | Admitting: Nurse Practitioner

## 2018-07-12 DIAGNOSIS — M2042 Other hammer toe(s) (acquired), left foot: Secondary | ICD-10-CM | POA: Diagnosis not present

## 2018-07-12 DIAGNOSIS — M21612 Bunion of left foot: Secondary | ICD-10-CM | POA: Diagnosis not present

## 2018-07-12 DIAGNOSIS — M2041 Other hammer toe(s) (acquired), right foot: Secondary | ICD-10-CM | POA: Diagnosis not present

## 2018-07-12 DIAGNOSIS — M21611 Bunion of right foot: Secondary | ICD-10-CM | POA: Diagnosis not present

## 2018-07-12 DIAGNOSIS — I739 Peripheral vascular disease, unspecified: Secondary | ICD-10-CM | POA: Diagnosis not present

## 2018-07-12 DIAGNOSIS — L84 Corns and callosities: Secondary | ICD-10-CM | POA: Diagnosis not present

## 2018-07-12 DIAGNOSIS — B351 Tinea unguium: Secondary | ICD-10-CM | POA: Diagnosis not present

## 2018-07-18 ENCOUNTER — Ambulatory Visit (INDEPENDENT_AMBULATORY_CARE_PROVIDER_SITE_OTHER): Payer: Medicare HMO | Admitting: Cardiology

## 2018-07-18 ENCOUNTER — Encounter: Payer: Self-pay | Admitting: Cardiology

## 2018-07-18 VITALS — BP 120/80 | HR 74 | Ht 63.0 in | Wt 160.0 lb

## 2018-07-18 DIAGNOSIS — R600 Localized edema: Secondary | ICD-10-CM | POA: Diagnosis not present

## 2018-07-18 DIAGNOSIS — I482 Chronic atrial fibrillation: Secondary | ICD-10-CM | POA: Diagnosis not present

## 2018-07-18 DIAGNOSIS — I10 Essential (primary) hypertension: Secondary | ICD-10-CM | POA: Diagnosis not present

## 2018-07-18 DIAGNOSIS — I5042 Chronic combined systolic (congestive) and diastolic (congestive) heart failure: Secondary | ICD-10-CM

## 2018-07-18 DIAGNOSIS — Z8673 Personal history of transient ischemic attack (TIA), and cerebral infarction without residual deficits: Secondary | ICD-10-CM

## 2018-07-18 DIAGNOSIS — I6523 Occlusion and stenosis of bilateral carotid arteries: Secondary | ICD-10-CM | POA: Diagnosis not present

## 2018-07-18 DIAGNOSIS — I4821 Permanent atrial fibrillation: Secondary | ICD-10-CM

## 2018-07-18 NOTE — Patient Instructions (Addendum)
Medication Instructions:  Your physician has recommended you make the following change in your medication:   1. TAKE LASIX 40 MG DAILY FOR 4 DAYS, THEN TAKE 40 MG EVERY OTHER DAY.   Labwork: None ordered  Testing/Procedures: None ordered  Follow-Up: Your physician wants you to follow-up in: 3-4 MONTHS WITH Norma Fredrickson, NP OR Berton Bon, NP.  Any Other Special Instructions Will Be Listed Below (If Applicable).     If you need a refill on your cardiac medications before your next appointment, please call your pharmacy.  Two Gram Sodium Diet 2000 mg  What is Sodium? Sodium is a mineral found naturally in many foods. The most significant source of sodium in the diet is table salt, which is about 40% sodium.  Processed, convenience, and preserved foods also contain a large amount of sodium.  The body needs only 500 mg of sodium daily to function,  A normal diet provides more than enough sodium even if you do not use salt.  Why Limit Sodium? A build up of sodium in the body can cause thirst, increased blood pressure, shortness of breath, and water retention.  Decreasing sodium in the diet can reduce edema and risk of heart attack or stroke associated with high blood pressure.  Keep in mind that there are many other factors involved in these health problems.  Heredity, obesity, lack of exercise, cigarette smoking, stress and what you eat all play a role.  General Guidelines:  Do not add salt at the table or in cooking.  One teaspoon of salt contains over 2 grams of sodium.  Read food labels  Avoid processed and convenience foods  Ask your dietitian before eating any foods not dicussed in the menu planning guidelines  Consult your physician if you wish to use a salt substitute or a sodium containing medication such as antacids.  Limit milk and milk products to 16 oz (2 cups) per day.  Shopping Hints:  READ LABELS!! "Dietetic" does not necessarily mean low sodium.  Salt and  other sodium ingredients are often added to foods during processing.   Menu Planning Guidelines Food Group Choose More Often Avoid  Beverages (see also the milk group All fruit juices, low-sodium, salt-free vegetables juices, low-sodium carbonated beverages Regular vegetable or tomato juices, commercially softened water used for drinking or cooking  Breads and Cereals Enriched white, wheat, rye and pumpernickel bread, hard rolls and dinner rolls; muffins, cornbread and waffles; most dry cereals, cooked cereal without added salt; unsalted crackers and breadsticks; low sodium or homemade bread crumbs Bread, rolls and crackers with salted tops; quick breads; instant hot cereals; pancakes; commercial bread stuffing; self-rising flower and biscuit mixes; regular bread crumbs or cracker crumbs  Desserts and Sweets Desserts and sweets mad with mild should be within allowance Instant pudding mixes and cake mixes  Fats Butter or margarine; vegetable oils; unsalted salad dressings, regular salad dressings limited to 1 Tbs; light, sour and heavy cream Regular salad dressings containing bacon fat, bacon bits, and salt pork; snack dips made with instant soup mixes or processed cheese; salted nuts  Fruits Most fresh, frozen and canned fruits Fruits processed with salt or sodium-containing ingredient (some dried fruits are processed with sodium sulfites        Vegetables Fresh, frozen vegetables and low- sodium canned vegetables Regular canned vegetables, sauerkraut, pickled vegetables, and others prepared in brine; frozen vegetables in sauces; vegetables seasoned with ham, bacon or salt pork  Condiments, Sauces, Miscellaneous  Salt substitute with physician's  approval; pepper, herbs, spices; vinegar, lemon or lime juice; hot pepper sauce; garlic powder, onion powder, low sodium soy sauce (1 Tbs.); low sodium condiments (ketchup, chili sauce, mustard) in limited amounts (1 tsp.) fresh ground horseradish; unsalted  tortilla chips, pretzels, potato chips, popcorn, salsa (1/4 cup) Any seasoning made with salt including garlic salt, celery salt, onion salt, and seasoned salt; sea salt, rock salt, kosher salt; meat tenderizers; monosodium glutamate; mustard, regular soy sauce, barbecue, sauce, chili sauce, teriyaki sauce, steak sauce, Worcestershire sauce, and most flavored vinegars; canned gravy and mixes; regular condiments; salted snack foods, olives, picles, relish, horseradish sauce, catsup   Food preparation: Try these seasonings Meats:    Pork Sage, onion Serve with applesauce  Chicken Poultry seasoning, thyme, parsley Serve with cranberry sauce  Lamb Curry powder, rosemary, garlic, thyme Serve with mint sauce or jelly  Veal Marjoram, basil Serve with current jelly, cranberry sauce  Beef Pepper, bay leaf Serve with dry mustard, unsalted chive butter  Fish [place] leaf, dill Serve with unsalted lemon butter, unsalted parsley butter  Vegetables:    Asparagus Lemon juice   Broccoli Lemon juice   Carrots Mustard dressing parsley, mint, nutmeg, glazed with unsalted butter and sugar   Green beans Marjoram, lemon juice, nutmeg,dill seed   Tomatoes Basil, marjoram, onion   Spice /blend for [employer]" 4 tsp ground thyme 1 tsp ground sage 3 tsp ground rosemary 4 tsp ground marjoram   Test your knowledge 1. A product that says "Salt Free" may still contain sodium. True or False 2. Garlic Powder and Hot Pepper Sauce an be used as alternative seasonings.True or False 3. Processed foods have more sodium than fresh foods.  True or False 4. Canned Vegetables have less sodium than froze True or False  WAYS TO DECREASE YOUR SODIUM INTAKE 1. Avoid the use of added salt in cooking and at the table.  Table salt (and other prepared seasonings which contain salt) is probably one of the greatest sources of sodium in the diet.  Unsalted foods can gain flavor from the sweet, sour, and butter taste sensations of herbs and  spices.  Instead of using salt for seasoning, try the following seasonings with the foods listed.  Remember: how you use them to enhance natural food flavors is limited only by your creativity... Allspice-Meat, fish, eggs, fruit, peas, red and yellow vegetables Almond Extract-Fruit baked goods Anise Seed-Sweet breads, fruit, carrots, beets, cottage cheese, cookies (tastes like licorice) Basil-Meat, fish, eggs, vegetables, rice, vegetables salads, soups, sauces Bay Leaf-Meat, fish, stews, poultry Burnet-Salad, vegetables (cucumber-like flavor) Caraway Seed-Bread, cookies, cottage cheese, meat, vegetables, cheese, rice Cardamon-Baked goods, fruit, soups Celery Powder or seed-Salads, salad dressings, sauces, meatloaf, soup, bread.Do not use  celery salt Chervil-Meats, salads, fish, eggs, vegetables, cottage cheese (parsley-like flavor) Chili Power-Meatloaf, chicken cheese, corn, eggplant, egg dishes Chives-Salads cottage cheese, egg dishes, soups, vegetables, sauces Cilantro-Salsa, casseroles Cinnamon-Baked goods, fruit, pork, lamb, chicken, carrots Cloves-Fruit, baked goods, fish, pot roast, green beans, beets, carrots Coriander-Pastry, cookies, meat, salads, cheese (lemon-orange flavor) Cumin-Meatloaf, fish,cheese, eggs, cabbage,fruit pie (caraway flavor) [employer], fruit, eggs, fish, poultry, cottage cheese, vegetables Dill Seed-Meat, cottage cheese, poultry, vegetables, fish, salads, bread Fennel Seed-Bread, cookies, apples, pork, eggs, fish, beets, cabbage, cheese, Licorice-like flavor Garlic-(buds or powder) Salads, meat, poultry, fish, bread, butter, vegetables, potatoes.Do not  use garlic salt Ginger-Fruit, vegetables, baked goods, meat, fish, poultry Horseradish Root-Meet, vegetables, butter Lemon Juice or Extract-Vegetables, fruit, tea, baked goods, fish salads Mace-Baked goods fruit, vegetables, fish, poultry (taste  like nutmeg) Maple Extract-Syrups Marjoram-Meat, chicken,  fish, vegetables, breads, green salads (taste like Sage) Mint-Tea, lamb, sherbet, vegetables, desserts, carrots, cabbage Mustard, Dry or Seed-Cheese, eggs, meats, vegetables, poultry Nutmeg-Baked goods, fruit, chicken, eggs, vegetables, desserts Onion Powder-Meat, fish, poultry, vegetables, cheese, eggs, bread, rice salads (Do not use   Onion salt) Orange Extract-Desserts, baked goods Oregano-Pasta, eggs, cheese, onions, pork, lamb, fish, chicken, vegetables, green salads Paprika-Meat, fish, poultry, eggs, cheese, vegetables Parsley Flakes-Butter, vegetables, meat fish, poultry, eggs, bread, salads (certain forms may   Contain sodium Pepper-Meat fish, poultry, vegetables, eggs Peppermint Extract-Desserts, baked goods Poppy Seed-Eggs, bread, cheese, fruit dressings, baked goods, noodles, vegetables, cottage  [employer], poultry, meat, fish, cauliflower, turnips,eggs bread Saffron-Rice, bread, veal, chicken, fish, eggs Sage-Meat, fish, poultry, onions, eggplant, tomateos, pork, stews Savory-Eggs, salads, poultry, meat, rice, vegetables, soups, pork Tarragon-Meat, poultry, fish, eggs, butter, vegetables (licorice-like flavor)  Thyme-Meat, poultry, fish, eggs, vegetables, (clover-like flavor), sauces, soups Tumeric-Salads, butter, eggs, fish, rice, vegetables (saffron-like flavor) Vanilla Extract-Baked goods, candy Vinegar-Salads, vegetables, meat marinades Walnut Extract-baked goods, candy  2. Choose your Foods Wisely   The following is a list of foods to avoid which are high in sodium:  Meats-Avoid all smoked, canned, salt cured, dried and kosher meat and fish as well as Anchovies   [employer] meats:Bologna, Liverwurst, Pastrami Canned meat or fish  Marinated herring Caviar    Pepperoni Corned Beef   Pizza Dried chipped beef  Salami Frozen breaded fish or meat Salt pork Frankfurters or hot dogs  Sardines Gefilte  fish   Sausage Ham (boiled ham, Proscuitto Smoked butt    spiced ham)   Spam      TV Dinners Vegetables Canned vegetables (Regular) Relish Canned mushrooms  Sauerkraut Olives    Tomato juice Pickles  Bakery and Dessert Products Canned puddings  Cream pies Cheesecake   Decorated cakes Cookies  Beverages/Juices Tomato juice, regular  Gatorade   V-8 vegetable juice, regular  Breads and Cereals Biscuit mixes   Salted potato chips, corn chips, pretzels Bread stuffing mixes  Salted crackers and rolls Pancake and waffle mixes Self-rising flour  Seasonings Accent    Meat sauces Barbecue sauce  Meat tenderizer Catsup    Monosodium glutamate (MSG) Celery salt   Onion salt Chili sauce   Prepared mustard Garlic salt   Salt, seasoned salt, sea salt Gravy mixes   Soy sauce Horseradish   Steak sauce Ketchup   Tartar sauce Lite salt    Teriyaki sauce Marinade mixes   Worcestershire sauce  Others Baking powder   Cocoa and cocoa mixes Baking soda   Commercial casserole mixes Candy-caramels, chocolate  Dehydrated soups    Bars, fudge,nougats  Instant rice and pasta mixes Canned broth or soup  Maraschino cherries Cheese, aged and processed cheese and cheese spreads  Learning Assessment Quiz  Indicated T (for True) or F (for False) for each of the following statements:  1. _____ Fresh fruits and vegetables and unprocessed grains are generally low in sodium 2. _____ Water may contain a considerable amount of sodium, depending on the source 3. _____ You can always tell if a food is high in sodium by tasting it 4. _____ Certain laxatives my be high in sodium and should be avoided unless prescribed   by a physician or pharmacist 5. _____ Salt substitutes may be used freely by anyone on a sodium restricted diet 6. _____ Sodium is present in table salt, food additives and as a natural  component of   most foods 7. _____ Table salt is approximately 90% sodium 8. _____ Limiting sodium  intake may help prevent excess fluid accumulation in the body 9. _____ On a sodium-restricted diet, seasonings such as bouillon soy sauce, and    cooking wine should be used in place of table salt 10. _____ On an ingredient list, a product which lists monosodium glutamate as the first   ingredient is an appropriate food to include on a low sodium diet  Circle the best answer(s) to the following statements (Hint: there may be more than one correct answer)  11. On a low-sodium diet, some acceptable snack items are:    A. Olives  F. Bean dip   K. Grapefruit juice    B. Salted Pretzels G. Commercial Popcorn   L. Canned peaches    C. Carrot Sticks  H. Bouillon   M. Unsalted nuts   D. [place] fries  I. Peanut butter crackers N. Salami   E. Sweet pickles J. Tomato Juice   O. Pizza  12.  Seasonings that may be used freely on a reduced - sodium diet include   A. Lemon wedges F.Monosodium glutamate K. Celery seed    B.Soysauce   G. Pepper   L. Mustard powder   C. Sea salt  H. Cooking wine  M. Onion flakes   D. Vinegar  E. Prepared horseradish N. Salsa   E. Sage   J. Worcestershire sauce  O. Chutney

## 2018-07-18 NOTE — Progress Notes (Signed)
Cardiology Office Note:    Date:  07/18/2018   ID:  [name], DOB [birth year], MRN [id number]  PCP:  Joanna Rainier, MD  Cardiologist:  Peter Swaziland, MD- Follows with Joanna Fredrickson, NP  Referring MD: Joanna Rainier, MD   Chief Complaint  Patient presents with  . Leg Swelling    History of Present Illness:    Joanna [name] is a 77 y.o. female with a past medical history significant for permanent atrial fibrillation on warfarin, prior history of middle cerebral artery CVA 2012, recurrent stroke 2016, hypertension and prior LV dysfunction.  He was last seen by Joanna [name] on 01/10/2018 at which time she was doing well.  Joanna [name] did note that the patient had not seen a primary care provider in quite some time and the patient was encouraged to follow-up with her PCP.  Joanna [name] is here with her daughter. She is complaining of lower extremity edema. She denies chest discomfort, dyspnea, orthopnea or PND. No palpitations or lightheadedness. She had lasix 40 mg QOD on her med list but states that she has not been taking it for a long time. The bottle that she brings in with her is dated 11/2016 and is full.  Her daughter says that she does not like to have to urinate so much. Her daughter also reports that in the past she stopped taking her coumadin thinking that she was taking too many medications and that is when she had a stroke. She says that she learned her lesson and is now compliant with the coumadin. She also admits that she uses a lot of salt.   Past Medical History:  Diagnosis Date  . Acute renal failure (HCC) 02/23/2007-03/22/2007   Now with normalization back to baseline  . Aphasia 2012  . CHF (congestive heart failure) (HCC)   . Chronic atrial fibrillation (HCC)   . Chronic kidney disease   . Diverticulosis   . Dysnomia 12/2010  . Hypertension   . Ischemic bowel disease (HCC)    With GI bleeding  . Left ventricular dysfunction    EF is now 50-55% as of 2012  .  Obesity   . Splenic infarct   . Stroke [name]) February 2012   Now back on coumadin    Past Surgical History:  Procedure Laterality Date  . COLECTOMY  12/2010   partial  . ILEOSTOMY    . OMENTECTOMY     partial with ileostomy    Current Medications: Current Meds  Medication Sig  . diltiazem (CARTIA XT) 120 MG 24 hr capsule Take 1 capsule (120 mg total) by mouth daily.  . furosemide (LASIX) 40 MG tablet Take 40 mg by mouth. Take 40 mg daily for 4 days, then take 40 mg every other day.  . metoprolol tartrate (LOPRESSOR) 100 MG tablet Take 1 tablet (100 mg total) by mouth 2 (two) times daily.  Marland Kitchen warfarin (COUMADIN) 5 MG tablet TAKE ONE TO ONE AND ONE-HALF TABLET BY MOUTH ONCE DAILY AS DIRECTED BY COUMADIN CLINIC     Allergies:   Patient has no known allergies.   Social History   Socioeconomic History  . Marital status: Widowed    Spouse name: Not on file  . Number of children: 3  . Years of education: Not on file  . Highest education level: Not on file  Occupational History    Employer: RETIRED  Social Needs  . Financial resource strain: Not on file  . Food insecurity:    Worry:  Not on file    Inability: Not on file  . Transportation needs:    Medical: Not on file    Non-medical: Not on file  Tobacco Use  . Smoking status: Never Smoker  . Smokeless tobacco: Never Used  Substance and Sexual Activity  . Alcohol use: No  . Drug use: No  . Sexual activity: Not on file  Lifestyle  . Physical activity:    Days per week: Not on file    Minutes per session: Not on file  . Stress: Not on file  Relationships  . Social connections:    Talks on phone: Not on file    Gets together: Not on file    Attends religious service: Not on file    Active member of club or organization: Not on file    Attends meetings of clubs or organizations: Not on file    Relationship status: Not on file  Other Topics Concern  . Not on file  Social History Narrative  . Not on file      Family History: [name] family history includes Heart disease in her mother; Hypertension in her sister. ROS:   Please see the history of present illness.     All other systems reviewed and are negative.  EKGs/Labs/Other Studies Reviewed:    The following studies were reviewed today:  Echocardiogram 11/28/2016 Study Conclusions - Left ventricle: The cavity size was normal. Wall thickness was   increased in a pattern of mild LVH. Systolic function was normal.   The estimated ejection fraction was in the range of 55% to 60%. - Mitral valve: Calcified annulus. Mildly thickened leaflets .   There was mild regurgitation. - Left atrium: The atrium was severely dilated. - Right ventricle: The cavity size was severely dilated. Systolic   function was mildly reduced. - Right atrium: The atrium was massively dilated. - Tricuspid valve: There was moderate-severe regurgitation. - Pulmonic valve: There was moderate regurgitation. - Pulmonary arteries: PA peak pressure: 78 mm Hg (S).    Echo Study Conclusions from 10/2015 - Left ventricle: The cavity size was normal. Systolic function was  normal. The estimated ejection fraction was in the range of 50%  to 55%. Wall motion was normal; there were no regional wall  motion abnormalities. - Aortic valve: Trileaflet; mildly thickened, mildly calcified  leaflets. - Mitral valve: Calcified annulus. There was mild to moderate  regurgitation. - Left atrium: The atrium was severely dilated. - Right ventricle: The cavity size was mildly dilated. Wall  thickness was normal. Systolic function was mildly reduced. - Right atrium: The atrium was massively dilated. - Tricuspid valve: There was severe regurgitation. - Pulmonary arteries: PA peak pressure: 56 mm Hg (S).  Impressions: - The right ventricular systolic pressure was increased consistent  with moderate pulmonary hypertension.   EKG:  EKG is not ordered today.     Recent Labs: 01/10/2018: Platelets 165 01/23/2018: ALT 9; TSH 4.630 07/02/2018: BUN 15; Creatinine, Ser 1.00; Hemoglobin 15.6; Potassium 3.9; Sodium 141   Recent Lipid Panel    Component Value Date/Time   CHOL 120 11/28/2016 0416   TRIG 51 11/28/2016 0416   HDL 46 11/28/2016 0416   CHOLHDL 2.6 11/28/2016 0416   VLDL 10 11/28/2016 0416   LDLCALC 64 11/28/2016 0416    Physical Exam:    VS:  BP 120/80   Pulse 74   Ht 5\' 3"  (1.6 m)   Wt 160 lb (72.6 kg)   SpO2 95%  BMI 28.34 kg/m     Wt Readings from Last 3 Encounters:  07/18/18 160 lb (72.6 kg)  01/10/18 174 lb 6.4 oz (79.1 kg)  11/27/16 181 lb 3.5 oz (82.2 kg)     Physical Exam  Constitutional: She is oriented to person, place, and time. She appears well-developed and well-nourished. No distress.  HENT:  Head: Normocephalic and atraumatic.  Neck: Normal range of motion. Neck supple. No JVD present.  Cardiovascular: Normal rate, regular rhythm, normal heart sounds and intact distal pulses. Exam reveals no gallop and no friction rub.  No murmur heard. Pulmonary/Chest: Effort normal and breath sounds normal. No respiratory distress. She has no wheezes. She has no rales.  Abdominal: Soft. Bowel sounds are normal.  Musculoskeletal: Normal range of motion. She exhibits edema.  Trace-1+ edema up to mid calf.   Neurological: She is alert and oriented to person, place, and time.  Skin: Skin is warm and dry.  Psychiatric: She has a normal mood and affect. Her behavior is normal. Judgment and thought content normal.  Vitals reviewed.   ASSESSMENT:    1. Lower leg edema   2. Permanent atrial fibrillation (HCC)   3. History of stroke   4. Essential (primary) hypertension   5. Chronic combined systolic and diastolic heart failure (HCC)   6. Bilateral carotid artery stenosis    PLAN:    In order of problems listed above:  Lower extremity edema: No dyspnea, orthopnea or PND.  The patient was previously ordered Lasix 40  mg every other day, but she has not been taking it for probably at least a year.  I will restart her Lasix 40 mg every other day but advised her to take 40 mg daily for 4 days to try to reduce her lower extremity edema.  Also advised on low-sodium diet, less than 2 g/day. Elevate legs. Use compression stockings- her daughter will get them.   Permanent atrial fibrillation: Rate controlled. Asymptomatic.  Anticoagulated with warfarin for stroke risk reduction  Prior CVA: Patient is on warfarin for stroke risk reduction having refused to switching to DOAC in the past.  Hypertension: BP well controlled.   Chronic combined systolic and diastolic heart failure: Most recent echocardiogram in 11/2016 showed mild LVH with EF 55-60% with severely dilated bilateral atria and moderate-severe TR, moderate PR  Carotid artery stenosis: Carotid duplex 11/28/2016 showed bilateral 1-39% stenosis.  Currently does not need intervention.  Follow-up with Joanna Fredrickson, NP in 3-4 months   Medication Adjustments/Labs and Tests Ordered: Current medicines are reviewed at length with the patient today.  Concerns regarding medicines are outlined above. Labs and tests ordered and medication changes are outlined in the patient instructions below:  Patient Instructions   Medication Instructions:  Your physician has recommended you make the following change in your medication:   1. TAKE LASIX 40 MG DAILY FOR 4 DAYS, THEN TAKE 40 MG EVERY OTHER DAY.   Labwork: None ordered  Testing/Procedures: None ordered  Follow-Up: Your physician wants you to follow-up in: 3-4 MONTHS WITH Joanna Fredrickson, NP OR Berton Bon, NP.  Any Other Special Instructions Will Be Listed Below (If Applicable).     If you need a refill on your cardiac medications before your next appointment, please call your pharmacy.  Two Gram Sodium Diet 2000 mg  What is Sodium? Sodium is a mineral found naturally in many foods. The most  significant source of sodium in the diet is table salt, which is about 40% sodium.  Processed, convenience, and preserved foods also contain a large amount of sodium.  The body needs only 500 mg of sodium daily to function,  A normal diet provides more than enough sodium even if you do not use salt.  Why Limit Sodium? A build up of sodium in the body can cause thirst, increased blood pressure, shortness of breath, and water retention.  Decreasing sodium in the diet can reduce edema and risk of heart attack or stroke associated with high blood pressure.  Keep in mind that there are many other factors involved in these health problems.  Heredity, obesity, lack of exercise, cigarette smoking, stress and what you eat all play a role.  General Guidelines:  Do not add salt at the table or in cooking.  One teaspoon of salt contains over 2 grams of sodium.  Read food labels  Avoid processed and convenience foods  Ask your dietitian before eating any foods not dicussed in the menu planning guidelines  Consult your physician if you wish to use a salt substitute or a sodium containing medication such as antacids.  Limit milk and milk products to 16 oz (2 cups) per day.  Shopping Hints:  READ LABELS!! "Dietetic" does not necessarily mean low sodium.  Salt and other sodium ingredients are often added to foods during processing.   Menu Planning Guidelines Food Group Choose More Often Avoid  Beverages (see also the milk group All fruit juices, low-sodium, salt-free vegetables juices, low-sodium carbonated beverages Regular vegetable or tomato juices, commercially softened water used for drinking or cooking  Breads and Cereals Enriched white, wheat, rye and pumpernickel bread, hard rolls and dinner rolls; muffins, cornbread and waffles; most dry cereals, cooked cereal without added salt; unsalted crackers and breadsticks; low sodium or homemade bread crumbs Bread, rolls and crackers with salted tops; quick  breads; instant hot cereals; pancakes; commercial bread stuffing; self-rising flower and biscuit mixes; regular bread crumbs or cracker crumbs  Desserts and Sweets Desserts and sweets mad with mild should be within allowance Instant pudding mixes and cake mixes  Fats Butter or margarine; vegetable oils; unsalted salad dressings, regular salad dressings limited to 1 Tbs; light, sour and heavy cream Regular salad dressings containing bacon fat, bacon bits, and salt pork; snack dips made with instant soup mixes or processed cheese; salted nuts  Fruits Most fresh, frozen and canned fruits Fruits processed with salt or sodium-containing ingredient (some dried fruits are processed with sodium sulfites        Vegetables Fresh, frozen vegetables and low- sodium canned vegetables Regular canned vegetables, sauerkraut, pickled vegetables, and others prepared in brine; frozen vegetables in sauces; vegetables seasoned with ham, bacon or salt pork  Condiments, Sauces, Miscellaneous  Salt substitute with physician's approval; pepper, herbs, spices; vinegar, lemon or lime juice; hot pepper sauce; garlic powder, onion powder, low sodium soy sauce (1 Tbs.); low sodium condiments (ketchup, chili sauce, mustard) in limited amounts (1 tsp.) fresh ground horseradish; unsalted tortilla chips, pretzels, potato chips, popcorn, salsa (1/4 cup) Any seasoning made with salt including garlic salt, celery salt, onion salt, and seasoned salt; sea salt, rock salt, kosher salt; meat tenderizers; monosodium glutamate; mustard, regular soy sauce, barbecue, sauce, chili sauce, teriyaki sauce, steak sauce, Worcestershire sauce, and most flavored vinegars; canned gravy and mixes; regular condiments; salted snack foods, olives, picles, relish, horseradish sauce, catsup   Food preparation: Try these seasonings Meats:    Pork Sage, onion Serve with applesauce  Chicken Poultry seasoning, thyme, parsley Serve with cranberry  sauce  Lamb  Curry powder, rosemary, garlic, thyme Serve with mint sauce or jelly  Veal Marjoram, basil Serve with current jelly, cranberry sauce  Beef Pepper, bay leaf Serve with dry mustard, unsalted chive butter  Fish [place] leaf, dill Serve with unsalted lemon butter, unsalted parsley butter  Vegetables:    Asparagus Lemon juice   Broccoli Lemon juice   Carrots Mustard dressing parsley, mint, nutmeg, glazed with unsalted butter and sugar   Green beans Marjoram, lemon juice, nutmeg,dill seed   Tomatoes Basil, marjoram, onion   Spice /blend for [employer]" 4 tsp ground thyme 1 tsp ground sage 3 tsp ground rosemary 4 tsp ground marjoram   Test your knowledge 1. A product that says "Salt Free" may still contain sodium. True or False 2. Garlic Powder and Hot Pepper Sauce an be used as alternative seasonings.True or False 3. Processed foods have more sodium than fresh foods.  True or False 4. Canned Vegetables have less sodium than froze True or False  WAYS TO DECREASE YOUR SODIUM INTAKE 1. Avoid the use of added salt in cooking and at the table.  Table salt (and other prepared seasonings which contain salt) is probably one of the greatest sources of sodium in the diet.  Unsalted foods can gain flavor from the sweet, sour, and butter taste sensations of herbs and spices.  Instead of using salt for seasoning, try the following seasonings with the foods listed.  Remember: how you use them to enhance natural food flavors is limited only by your creativity... Allspice-Meat, fish, eggs, fruit, peas, red and yellow vegetables Almond Extract-Fruit baked goods Anise Seed-Sweet breads, fruit, carrots, beets, cottage cheese, cookies (tastes like licorice) Basil-Meat, fish, eggs, vegetables, rice, vegetables salads, soups, sauces Bay Leaf-Meat, fish, stews, poultry Burnet-Salad, vegetables (cucumber-like flavor) Caraway Seed-Bread, cookies, cottage cheese, meat, vegetables, cheese, rice Cardamon-Baked goods,  fruit, soups Celery Powder or seed-Salads, salad dressings, sauces, meatloaf, soup, bread.Do not use  celery salt Chervil-Meats, salads, fish, eggs, vegetables, cottage cheese (parsley-like flavor) Chili Power-Meatloaf, chicken cheese, corn, eggplant, egg dishes Chives-Salads cottage cheese, egg dishes, soups, vegetables, sauces Cilantro-Salsa, casseroles Cinnamon-Baked goods, fruit, pork, lamb, chicken, carrots Cloves-Fruit, baked goods, fish, pot roast, green beans, beets, carrots Coriander-Pastry, cookies, meat, salads, cheese (lemon-orange flavor) Cumin-Meatloaf, fish,cheese, eggs, cabbage,fruit pie (caraway flavor) [employer], fruit, eggs, fish, poultry, cottage cheese, vegetables Dill Seed-Meat, cottage cheese, poultry, vegetables, fish, salads, bread Fennel Seed-Bread, cookies, apples, pork, eggs, fish, beets, cabbage, cheese, Licorice-like flavor Garlic-(buds or powder) Salads, meat, poultry, fish, bread, butter, vegetables, potatoes.Do not  use garlic salt Ginger-Fruit, vegetables, baked goods, meat, fish, poultry Horseradish Root-Meet, vegetables, butter Lemon Juice or Extract-Vegetables, fruit, tea, baked goods, fish salads Mace-Baked goods fruit, vegetables, fish, poultry (taste like nutmeg) Maple Extract-Syrups Marjoram-Meat, chicken, fish, vegetables, breads, green salads (taste like Sage) Mint-Tea, lamb, sherbet, vegetables, desserts, carrots, cabbage Mustard, Dry or Seed-Cheese, eggs, meats, vegetables, poultry Nutmeg-Baked goods, fruit, chicken, eggs, vegetables, desserts Onion Powder-Meat, fish, poultry, vegetables, cheese, eggs, bread, rice salads (Do not use   Onion salt) Orange Extract-Desserts, baked goods Oregano-Pasta, eggs, cheese, onions, pork, lamb, fish, chicken, vegetables, green salads Paprika-Meat, fish, poultry, eggs, cheese, vegetables Parsley Flakes-Butter, vegetables, meat fish, poultry, eggs, bread, salads (certain forms may   Contain  sodium Pepper-Meat fish, poultry, vegetables, eggs Peppermint Extract-Desserts, baked goods Poppy Seed-Eggs, bread, cheese, fruit dressings, baked goods, noodles, vegetables, cottage  [employer], poultry, meat, fish, cauliflower, turnips,eggs bread Saffron-Rice, bread, veal, chicken, fish, eggs Sage-Meat, fish, poultry,  onions, eggplant, tomateos, pork, stews Savory-Eggs, salads, poultry, meat, rice, vegetables, soups, pork Tarragon-Meat, poultry, fish, eggs, butter, vegetables (licorice-like flavor)  Thyme-Meat, poultry, fish, eggs, vegetables, (clover-like flavor), sauces, soups Tumeric-Salads, butter, eggs, fish, rice, vegetables (saffron-like flavor) Vanilla Extract-Baked goods, candy Vinegar-Salads, vegetables, meat marinades Walnut Extract-baked goods, candy  2. Choose your Foods Wisely   The following is a list of foods to avoid which are high in sodium:  Meats-Avoid all smoked, canned, salt cured, dried and kosher meat and fish as well as Anchovies   [employer] meats:Bologna, Liverwurst, Pastrami Canned meat or fish  Marinated herring Caviar    Pepperoni Corned Beef   Pizza Dried chipped beef  Salami Frozen breaded fish or meat Salt pork Frankfurters or hot dogs  Sardines Gefilte fish   Sausage Ham (boiled ham, Proscuitto Smoked butt    spiced ham)   Spam      TV Dinners Vegetables Canned vegetables (Regular) Relish Canned mushrooms  Sauerkraut Olives    Tomato juice Pickles  Bakery and Dessert Products Canned puddings  Cream pies Cheesecake   Decorated cakes Cookies  Beverages/Juices Tomato juice, regular  Gatorade   V-8 vegetable juice, regular  Breads and Cereals Biscuit mixes   Salted potato chips, corn chips, pretzels Bread stuffing mixes  Salted crackers and rolls Pancake and waffle mixes Self-rising flour  Seasonings Accent    Meat sauces Barbecue sauce  Meat tenderizer Catsup    Monosodium  glutamate (MSG) Celery salt   Onion salt Chili sauce   Prepared mustard Garlic salt   Salt, seasoned salt, sea salt Gravy mixes   Soy sauce Horseradish   Steak sauce Ketchup   Tartar sauce Lite salt    Teriyaki sauce Marinade mixes   Worcestershire sauce  Others Baking powder   Cocoa and cocoa mixes Baking soda   Commercial casserole mixes Candy-caramels, chocolate  Dehydrated soups    Bars, fudge,nougats  Instant rice and pasta mixes Canned broth or soup  Maraschino cherries Cheese, aged and processed cheese and cheese spreads  Learning Assessment Quiz  Indicated T (for True) or F (for False) for each of the following statements:  1. _____ Fresh fruits and vegetables and unprocessed grains are generally low in sodium 2. _____ Water may contain a considerable amount of sodium, depending on the source 3. _____ You can always tell if a food is high in sodium by tasting it 4. _____ Certain laxatives my be high in sodium and should be avoided unless prescribed   by a physician or pharmacist 5. _____ Salt substitutes may be used freely by anyone on a sodium restricted diet 6. _____ Sodium is present in table salt, food additives and as a natural component of   most foods 7. _____ Table salt is approximately 90% sodium 8. _____ Limiting sodium intake may help prevent excess fluid accumulation in the body 9. _____ On a sodium-restricted diet, seasonings such as bouillon soy sauce, and    cooking wine should be used in place of table salt 10. _____ On an ingredient list, a product which lists monosodium glutamate as the first   ingredient is an appropriate food to include on a low sodium diet  Circle the best answer(s) to the following statements (Hint: there may be more than one correct answer)  11. On a low-sodium diet, some acceptable snack items are:    A. Olives  F. Bean dip   K. Grapefruit juice    B. Salted  Pretzels G. Commercial Popcorn   L. Canned peaches    C. Carrot  Sticks  H. Bouillon   M. Unsalted nuts   D. [place] fries  I. Peanut butter crackers N. Salami   E. Sweet pickles J. Tomato Juice   O. Pizza  12.  Seasonings that may be used freely on a reduced - sodium diet include   A. Lemon wedges F.Monosodium glutamate K. Celery seed    B.Soysauce   G. Pepper   L. Mustard powder   C. Sea salt  H. Cooking wine  M. Onion flakes   D. Vinegar  E. Prepared horseradish N. Salsa   E. Sage   J. Worcestershire sauce  O. Chutney      Signed, Berton Bon, NP  07/18/2018 7:19 PM    [facility]

## 2018-07-19 ENCOUNTER — Other Ambulatory Visit: Payer: Self-pay | Admitting: Nurse Practitioner

## 2018-07-20 ENCOUNTER — Telehealth: Payer: Self-pay | Admitting: Cardiology

## 2018-07-20 ENCOUNTER — Other Ambulatory Visit: Payer: Self-pay | Admitting: *Deleted

## 2018-07-20 MED ORDER — FUROSEMIDE 40 MG PO TABS: 40.0000 mg | ORAL_TABLET | ORAL | 0 refills | Status: DC

## 2018-07-20 NOTE — Telephone Encounter (Signed)
New Message        Patient daughter is calling to let us know that the "Lasix 40 mg was never sent to [facility]. Pls call and advise

## 2018-09-21 ENCOUNTER — Other Ambulatory Visit: Payer: Self-pay | Admitting: Cardiology

## 2018-09-24 ENCOUNTER — Other Ambulatory Visit: Payer: Self-pay | Admitting: Cardiology

## 2018-09-24 NOTE — Telephone Encounter (Signed)
Pt is overdue for appt and has been called regarding this. Left a message for pt to callback to schedule an appt since she was last seen in August 2019. Cannot authorize a refill since pt has not had INR checked.

## 2018-09-24 NOTE — Telephone Encounter (Signed)
Called pt and left a message for the pt to call back to schedule an appt as she is overdue for her INR follow-up appt. Will not refill until pt calls back to schedule an appt. Telephone numbers left for the pt to callback. She was last seen in August 2019.

## 2018-09-24 NOTE — Telephone Encounter (Signed)
Looks to be non-compiant

## 2018-09-24 NOTE — Telephone Encounter (Signed)
 *  STAT* If patient is at the pharmacy, call can be transferred to refill team.   1. Which medications need to be refilled? (please list name of each medication and dose if known) warfarin (COUMADIN) 5 MG tablet  2. Which pharmacy/location (including street and city if local pharmacy) is medication to be sent to? [facility]   3. Do they need a 30 day or 90 day supply? 30   Patient is currently out of medication.

## 2018-09-28 ENCOUNTER — Other Ambulatory Visit: Payer: Self-pay | Admitting: *Deleted

## 2018-10-06 ENCOUNTER — Observation Stay (HOSPITAL_COMMUNITY)
Admission: EM | Admit: 2018-10-06 | Discharge: 2018-10-12 | Disposition: A | Discharge status: Home / Self Care | Payer: Medicare HMO | Attending: Internal Medicine | Admitting: Internal Medicine

## 2018-10-06 ENCOUNTER — Emergency Department (HOSPITAL_COMMUNITY): Payer: Medicare HMO

## 2018-10-06 ENCOUNTER — Other Ambulatory Visit: Payer: Self-pay

## 2018-10-06 ENCOUNTER — Encounter (HOSPITAL_COMMUNITY): Payer: Self-pay

## 2018-10-06 ENCOUNTER — Observation Stay (HOSPITAL_COMMUNITY): Payer: Medicare HMO

## 2018-10-06 DIAGNOSIS — I509 Heart failure, unspecified: Secondary | ICD-10-CM | POA: Insufficient documentation

## 2018-10-06 DIAGNOSIS — I482 Chronic atrial fibrillation, unspecified: Secondary | ICD-10-CM | POA: Diagnosis present

## 2018-10-06 DIAGNOSIS — R471 Dysarthria and anarthria: Secondary | ICD-10-CM | POA: Diagnosis not present

## 2018-10-06 DIAGNOSIS — M6281 Muscle weakness (generalized): Secondary | ICD-10-CM | POA: Insufficient documentation

## 2018-10-06 DIAGNOSIS — Z5181 Encounter for therapeutic drug level monitoring: Secondary | ICD-10-CM | POA: Diagnosis not present

## 2018-10-06 DIAGNOSIS — R2681 Unsteadiness on feet: Secondary | ICD-10-CM | POA: Insufficient documentation

## 2018-10-06 DIAGNOSIS — G9389 Other specified disorders of brain: Secondary | ICD-10-CM | POA: Diagnosis not present

## 2018-10-06 DIAGNOSIS — R531 Weakness: Secondary | ICD-10-CM | POA: Diagnosis not present

## 2018-10-06 DIAGNOSIS — G459 Transient cerebral ischemic attack, unspecified: Principal | ICD-10-CM

## 2018-10-06 DIAGNOSIS — N189 Chronic kidney disease, unspecified: Secondary | ICD-10-CM | POA: Insufficient documentation

## 2018-10-06 DIAGNOSIS — Z23 Encounter for immunization: Secondary | ICD-10-CM | POA: Insufficient documentation

## 2018-10-06 DIAGNOSIS — I130 Hypertensive heart and chronic kidney disease with heart failure and stage 1 through stage 4 chronic kidney disease, or unspecified chronic kidney disease: Secondary | ICD-10-CM | POA: Insufficient documentation

## 2018-10-06 DIAGNOSIS — Z7189 Other specified counseling: Secondary | ICD-10-CM

## 2018-10-06 DIAGNOSIS — I6601 Occlusion and stenosis of right middle cerebral artery: Secondary | ICD-10-CM | POA: Diagnosis not present

## 2018-10-06 DIAGNOSIS — R2990 Unspecified symptoms and signs involving the nervous system: Secondary | ICD-10-CM | POA: Diagnosis not present

## 2018-10-06 DIAGNOSIS — Z7901 Long term (current) use of anticoagulants: Secondary | ICD-10-CM | POA: Diagnosis not present

## 2018-10-06 DIAGNOSIS — I4891 Unspecified atrial fibrillation: Secondary | ICD-10-CM | POA: Diagnosis not present

## 2018-10-06 DIAGNOSIS — Z79899 Other long term (current) drug therapy: Secondary | ICD-10-CM | POA: Diagnosis not present

## 2018-10-06 DIAGNOSIS — R0989 Other specified symptoms and signs involving the circulatory and respiratory systems: Secondary | ICD-10-CM | POA: Diagnosis not present

## 2018-10-06 DIAGNOSIS — I6523 Occlusion and stenosis of bilateral carotid arteries: Secondary | ICD-10-CM | POA: Diagnosis not present

## 2018-10-06 DIAGNOSIS — I10 Essential (primary) hypertension: Secondary | ICD-10-CM

## 2018-10-06 LAB — CBC WITH DIFFERENTIAL/PLATELET
Abs Immature Granulocytes: 0.02 10*3/uL (ref 0.00–0.07)
Basophils Absolute: 0 10*3/uL (ref 0.0–0.1)
Basophils Relative: 0 %
Eosinophils Absolute: 0 10*3/uL (ref 0.0–0.5)
Eosinophils Relative: 0 %
HCT: 43.5 % (ref 36.0–46.0)
Hemoglobin: 13.8 g/dL (ref 12.0–15.0)
Immature Granulocytes: 0 %
Lymphocytes Relative: 17 %
Lymphs Abs: 0.8 10*3/uL (ref 0.7–4.0)
MCH: 32.5 pg (ref 26.0–34.0)
MCHC: 31.7 g/dL (ref 30.0–36.0)
MCV: 102.6 fL — ABNORMAL HIGH (ref 80.0–100.0)
Monocytes Absolute: 1 10*3/uL (ref 0.1–1.0)
Monocytes Relative: 20 %
Neutro Abs: 2.9 10*3/uL (ref 1.7–7.7)
Neutrophils Relative %: 63 %
Platelets: 157 10*3/uL (ref 150–400)
RBC: 4.24 MIL/uL (ref 3.87–5.11)
RDW: 14.4 % (ref 11.5–15.5)
WBC: 4.8 10*3/uL (ref 4.0–10.5)
nRBC: 0 % (ref 0.0–0.2)

## 2018-10-06 LAB — BASIC METABOLIC PANEL
Anion gap: 10 (ref 5–15)
BUN: 12 mg/dL (ref 8–23)
CO2: 24 mmol/L (ref 22–32)
Calcium: 8.9 mg/dL (ref 8.9–10.3)
Chloride: 102 mmol/L (ref 98–111)
Creatinine, Ser: 0.93 mg/dL (ref 0.44–1.00)
GFR calc Af Amer: >60 mL/min (ref >60)
GFR calc non Af Amer: 58 mL/min — ABNORMAL LOW (ref >60)
Glucose, Bld: 88 mg/dL (ref 70–99)
Potassium: 3.8 mmol/L (ref 3.5–5.1)
Sodium: 136 mmol/L (ref 135–145)

## 2018-10-06 LAB — TROPONIN I: Troponin I: <0.03 ng/mL (ref <0.03)

## 2018-10-06 LAB — URINALYSIS, ROUTINE W REFLEX MICROSCOPIC
Bilirubin Urine: NEGATIVE
Glucose, UA: NEGATIVE mg/dL
Hgb urine dipstick: NEGATIVE
Ketones, ur: 5 mg/dL — AB
Leukocytes, UA: NEGATIVE
Nitrite: NEGATIVE
Protein, ur: NEGATIVE mg/dL
Specific Gravity, Urine: 1.019 (ref 1.005–1.030)
pH: 5 (ref 5.0–8.0)

## 2018-10-06 LAB — PROTIME-INR
INR: 2.2
Prothrombin Time: 24.2 seconds — ABNORMAL HIGH (ref 11.4–15.2)

## 2018-10-06 MED ORDER — IOPAMIDOL (ISOVUE-370) INJECTION 76%
100.0000 mL | Freq: Once | INTRAVENOUS | Status: AC | PRN
  Administered 2018-10-06: 100 mL via INTRAVENOUS

## 2018-10-06 MED ORDER — PNEUMOCOCCAL VAC POLYVALENT 25 MCG/0.5ML IJ INJ
0.5000 mL | INJECTION | INTRAMUSCULAR | Status: AC
  Administered 2018-10-07: 0.5 mL via INTRAMUSCULAR
  Filled 2018-10-06: qty 0.5

## 2018-10-06 MED ORDER — IOPAMIDOL (ISOVUE-370) INJECTION 76%
INTRAVENOUS | Status: AC
  Filled 2018-10-06: qty 50

## 2018-10-06 MED ORDER — STROKE: EARLY STAGES OF RECOVERY BOOK
Freq: Once | Status: AC
  Administered 2018-10-06
  Filled 2018-10-06 (×2): qty 1

## 2018-10-06 MED ORDER — INFLUENZA VAC SPLIT HIGH-DOSE 0.5 ML IM SUSY
0.5000 mL | PREFILLED_SYRINGE | INTRAMUSCULAR | Status: AC
  Administered 2018-10-07: 0.5 mL via INTRAMUSCULAR
  Filled 2018-10-06: qty 0.5

## 2018-10-06 MED ORDER — ACETAMINOPHEN 650 MG RE SUPP: 650.0000 mg | RECTAL | Status: DC | PRN

## 2018-10-06 MED ORDER — SODIUM CHLORIDE 0.9 % IV BOLUS
500.0000 mL | Freq: Once | INTRAVENOUS | Status: AC
  Administered 2018-10-06: 500 mL via INTRAVENOUS

## 2018-10-06 MED ORDER — ACETAMINOPHEN 160 MG/5ML PO SOLN: 650.0000 mg | ORAL | Status: DC | PRN

## 2018-10-06 MED ORDER — GUAIFENESIN ER 600 MG PO TB12
1200.0000 mg | ORAL_TABLET | Freq: Two times a day (BID) | ORAL | Status: DC
  Administered 2018-10-06 – 2018-10-12 (×12): 1200 mg via ORAL
  Filled 2018-10-06 (×12): qty 2

## 2018-10-06 MED ORDER — ACETAMINOPHEN 325 MG PO TABS
650.0000 mg | ORAL_TABLET | ORAL | Status: DC | PRN
  Administered 2018-10-07 – 2018-10-11 (×5): 650 mg via ORAL
  Filled 2018-10-06 (×5): qty 2

## 2018-10-06 NOTE — ED Provider Notes (Signed)
[facility] EMERGENCY DEPARTMENT Provider Note   CSN: [id number] Arrival date & time: 10/06/18  1343     History   Chief Complaint Chief Complaint  Patient presents with  . Weakness    HPI Joanna [name] is a 77 y.o. female.  HPI   77 year old female with weakness.  History varies depending on which family member you speak to.  Patient herself seems to downplay her symptoms significantly but she says she feels fine.  One of her daughters reports that over the past several days she has seemed very weak.  She has fallen several times.  Today she needed significant assistance just to stand up.  She has had some confusion as well.  Family reports that she has been repeating herself and her attention span seems short.    Past Medical History:  Diagnosis Date  . Acute renal failure (HCC) 02/23/2007-03/22/2007   Now with normalization back to baseline  . Aphasia 2012  . CHF (congestive heart failure) (HCC)   . Chronic atrial fibrillation   . Chronic kidney disease   . Diverticulosis   . Dysnomia 12/2010  . Hypertension   . Ischemic bowel disease (HCC)    With GI bleeding  . Left ventricular dysfunction    EF is now 50-55% as of 2012  . Obesity   . Splenic infarct   . Stroke [facility]) February 2012   Now back on coumadin    Patient Active Problem List   Diagnosis Date Noted  . Stroke-like symptoms 11/27/2016  . Stroke (cerebrum) (HCC) 10/27/2015  . CVA (cerebral infarction) 10/26/2015  . Acute ischemic left MCA stroke (HCC) 10/26/2015  . Chronic atrial fibrillation 10/26/2015  . Encounter for therapeutic drug monitoring 01/03/2014  . Left ventricular dysfunction   . Atrial fibrillation (HCC) 02/04/2011  . Stroke [facility]) 12/15/2010    Past Surgical History:  Procedure Laterality Date  . COLECTOMY  12/2010   partial  . ILEOSTOMY    . OMENTECTOMY     partial with ileostomy     OB History   None      Home Medications    Prior to Admission  medications   Medication Sig Start Date End Date Taking? Authorizing Provider  diltiazem (CARTIA XT) 120 MG 24 hr capsule Take 1 capsule (120 mg total) by mouth daily. 01/18/18   Rosalio Macadamia, NP  furosemide (LASIX) 40 MG tablet Take 1 tablet (40 mg total) by mouth every other day. Take 40 mg daily for 4 days, then take 40 mg every other day. 07/20/18   Berton Bon, NP  metoprolol tartrate (LOPRESSOR) 100 MG tablet Take 1 tablet (100 mg total) by mouth 2 (two) times daily. 02/15/18   Rosalio Macadamia, NP  warfarin (COUMADIN) 5 MG tablet TAKE 1 TO 1 AND 1/2 TABLETS BY MOUTH ONCE DAILY AS DIRECTED BY  COUMADIN  CLINIC 10/03/18   [place], Peter M, MD    Family History Family History  Problem Relation Age of Onset  . Heart disease Mother   . Hypertension Sister     Social History Social History   Tobacco Use  . Smoking status: Never Smoker  . Smokeless tobacco: Never Used  Substance Use Topics  . Alcohol use: No  . Drug use: No     Allergies   Patient has no known allergies.   Review of Systems Review of Systems All systems reviewed and negative, other than as noted in HPI.   Physical Exam Updated  Vital Signs BP (!) 143/94   Pulse 97   Temp 100.1 F (37.8 C) (Rectal)   Resp 19   Ht 5\' 3"  (1.6 m)   Wt 66.7 kg   SpO2 93%   BMI 26.04 kg/m   Physical Exam  Constitutional: She appears well-developed and well-nourished. No distress.  HENT:  Head: Normocephalic and atraumatic.  Eyes: Conjunctivae are normal. Right eye exhibits no discharge. Left eye exhibits no discharge.  Neck: Neck supple.  Cardiovascular: Normal rate, regular rhythm and normal heart sounds. Exam reveals no gallop and no friction rub.  No murmur heard. Pulmonary/Chest: Effort normal and breath sounds normal. No respiratory distress.  Abdominal: Soft. She exhibits no distension. There is no tenderness.  Musculoskeletal: She exhibits edema. She exhibits no tenderness.  Symmetric LE edema    Neurological: She is alert.  Speech clear. Somewhat tangential. Seems to lose train of thought easily. CN 2-12 intact. Strength 5/5 b/l u/l extremities.   Skin: Skin is warm and dry.  Psychiatric: She has a normal mood and affect. Her behavior is normal.  Nursing note and vitals reviewed.    ED Treatments / Results  Labs (all labs ordered are listed, but only abnormal results are displayed) Labs Reviewed  CBC WITH DIFFERENTIAL/PLATELET - Abnormal; Notable for the following components:      Result Value   MCV 102.6 (*)    All other components within normal limits  BASIC METABOLIC PANEL - Abnormal; Notable for the following components:   GFR calc non Af Amer 58 (*)    All other components within normal limits  URINALYSIS, ROUTINE W REFLEX MICROSCOPIC - Abnormal; Notable for the following components:   Color, Urine AMBER (*)    Ketones, ur 5 (*)    All other components within normal limits  PROTIME-INR - Abnormal; Notable for the following components:   Prothrombin Time 24.2 (*)    All other components within normal limits  LIPID PANEL - Abnormal; Notable for the following components:   HDL 22 (*)    All other components within normal limits  PROTIME-INR - Abnormal; Notable for the following components:   Prothrombin Time 25.3 (*)    All other components within normal limits  URINE CULTURE  TROPONIN I  HEMOGLOBIN A1C    EKG EKG Interpretation  Date/Time:  Saturday October 06 2018 13:54:46 EST Ventricular Rate:  97 PR Interval:    QRS Duration: 104 QT Interval:  344 QTC Calculation: 437 R Axis:   52 Text Interpretation:  Atrial fibrillation Borderline low voltage, extremity leads Repol abnrm suggests ischemia, diffuse leads similar to previous from 11/2016 Confirmed by Raeford Razor [phone]) on 10/06/2018 2:01:02 PM   Radiology No results found.  Procedures Procedures (including critical care time)  Medications Ordered in ED Medications - No data to  display   Initial Impression / Assessment and Plan / ED Course  I have reviewed the triage vital signs and the nursing notes.  Pertinent labs & imaging results that were available during my care of the patient were reviewed by me and considered in my medical decision making (see chart for details).     77 year old female with what sounds like generalized weakness.  History varies though from different family members.  She is fallen several times in the past few days.  She denies any injuries from these. On exam in bed though objectively her strength seems normal.  Does seem mildly confused though.  Will CT head although I do not think  that this is a CVA.  Family is very concerned about this though and report she is not always compliant with taking her meds.  Basic labs and UA.    Final Clinical Impressions(s) / ED Diagnoses   Final diagnoses:  Generalized weakness    ED Discharge Orders    None       Raeford RazorKohut, Marlette Curvin, MD 10/07/18 1114

## 2018-10-06 NOTE — Consult Note (Signed)
Neurology Consultation  Reason for Consult: Stroke/TIA-right-sided weakness Referring Physician: Dr. Gerhard Munchobert Lockwood  CC: Right-sided weakness  History is obtained from: Family, patient, chart  HPI: Joanna [name] is a 77 y.o. female past medical history of left MCA territory strokes with unclear residual deficits-may be some mild word finding difficulty and right-sided weakness, CHF, chronic atrial fibrillation on Coumadin-missed 2 doses, hypertension, presenting to the emergency room for evaluation of right-sided weakness. The patient initially came in presenting of generalized weakness and difficulty walking this morning.  She was normal at about 10 PM yesterday when her daughter put her to bed but this morning when she woke up she could not walk normally.  She does have some weakness off and on because of her prior strokes according to the family but this was different than her usual disability. The family also said that she has some cognitive deficits-more short-term memory difficulty but has had no formal memory evaluation done and no dementia diagnosed. The daughter reported that she was leaning to the right while trying to sit up in the chair or bed and also when she assisted her out of the bed to go to the bathroom.  This is something new and acute according to the family. The patient also reports of feeling some more weakness on the right side than usual and not being able to use her right arm and leg as well as before.  Initial evaluation in the emergency room was unremarkable for a large vessel stroke and a code stroke was not activated. The patient was outside the window for IV TPA with last known normal being about 22 hours prior to presentation. She reports a current nagging cough and feels that she has some shortness of breath with it. She denies any chest pain or palpitations.  She denies any abdominal pain nausea vomiting.  She denies visual symptoms or headaches. Family denies  any seizure-like activity.  No staring spells or episodes.  LKW: 10 PM on 10/05/2018 tpa given?: no, outside the window Premorbid modified Rankin scale (mRS): 2-3 ROS: ROS was performed and is negative except as noted in the HPI.   Past Medical History:  Diagnosis Date  . Acute renal failure (HCC) 02/23/2007-03/22/2007   Now with normalization back to baseline  . Aphasia 2012  . CHF (congestive heart failure) (HCC)   . Chronic atrial fibrillation   . Chronic kidney disease   . Diverticulosis   . Dysnomia 12/2010  . Hypertension   . Ischemic bowel disease (HCC)    With GI bleeding  . Left ventricular dysfunction    EF is now 50-55% as of 2012  . Obesity   . Splenic infarct   . Stroke [facility](HCC) February 2012   Now back on coumadin    Family History  Problem Relation Age of Onset  . Heart disease Mother   . Hypertension Sister    Social History:   reports that she has never smoked. She has never used smokeless tobacco. She reports that she does not drink alcohol or use drugs.  Medications No current facility-administered medications for this encounter.   Current Outpatient Medications:  .  diltiazem (CARTIA XT) 120 MG 24 hr capsule, Take 1 capsule (120 mg total) by mouth daily., Disp: 90 capsule, Rfl: 3 .  furosemide (LASIX) 40 MG tablet, Take 1 tablet (40 mg total) by mouth every other day. Take 40 mg daily for 4 days, then take 40 mg every other day., Disp: 30 tablet, Rfl:  0 .  metoprolol tartrate (LOPRESSOR) 100 MG tablet, Take 1 tablet (100 mg total) by mouth 2 (two) times daily., Disp: 180 tablet, Rfl: 3 .  warfarin (COUMADIN) 5 MG tablet, TAKE 1 TO 1 AND 1/2 TABLETS BY MOUTH ONCE DAILY AS DIRECTED BY  COUMADIN  CLINIC, Disp: 15 tablet, Rfl: 0   Exam: Current vital signs: BP 140/87   Pulse 76   Temp 100.1 F (37.8 C) (Rectal)   Resp (!) 22   Ht 5\' 3"  (1.6 m)   Wt 66.7 kg   SpO2 98%   BMI 26.04 kg/m  Vital signs in last 24 hours: Temp:  [98.8 F (37.1 C)-100.1 F  (37.8 C)] 100.1 F (37.8 C) (11/23 1453) Pulse Rate:  [76-101] 76 (11/23 1900) Resp:  [15-27] 22 (11/23 1715) BP: (130-153)/(74-94) 140/87 (11/23 1900) SpO2:  [93 %-98 %] 98 % (11/23 1900) Weight:  [66.7 kg] 66.7 kg (11/23 1348) GENERAL: Awake, alert in NAD HEENT: - Normocephalic and atraumatic, dry mm, no LN++, no Thyromegally LUNGS -some decreased breath sounds in the mid chest bilaterally, scattered rales. CV - S1S2 RRR, no m/r/g, equal pulses bilaterally. ABDOMEN - Soft, nontender, nondistended with normoactive BS Ext: warm, well perfused, intact peripheral pulses, 2+ pitting edema both legs  NEURO:  Mental Status: AA&Ox 2.  Was able to tell me her name and age.  Unable to tell me the month or the year.  Able to tell me the president.  Able to tell me the president before the current president.  Was able to also tell me the president who is from [place] and was shot in [place]. Language: speech is clear.  Naming, repetition, fluency, and comprehension intact.  Attention concentration is mildly impaired Cranial Nerves: PERRL. EOMI, visual fields full, no facial asymmetry, facial sensation intact, hearing intact, tongue/uvula/soft palate midline, normal sternocleidomastoid and trapezius muscle strength. No evidence of tongue atrophy or fibrillations Motor: No vertical drift but 4/5 strength in the right upper and lower extremity.  5/5 left upper and lower extremity. Tone: is normal and bulk is normal Sensation- Intact to light touch bilaterally Coordination: Mildly ataxic on the right upper and lower extremity but not disproportionate to the weakness.  Intact coordination on the left. Gait- deferred  NIHSS- 1a Level of Conscious.: 0 1b LOC Questions: 1 1c LOC Commands: 0 2 Best Gaze: 0 3 Visual: 0 4 Facial Palsy: 0 5a Motor Arm - left: 0 5b Motor Arm - Right:0  6a Motor Leg - Left: 0 6b Motor Leg - Right:0  7 Limb Ataxia: 0 8 Sensory: 0 9 Best Language: 0 10  Dysarthria: 0 11 Extinct. and Inatten.: 0 TOTAL: 1  Labs I have reviewed labs in epic and the results pertinent to this consultation are:  CBC    Component Value Date/Time   WBC 4.8 10/06/2018 1431   RBC 4.24 10/06/2018 1431   HGB 13.8 10/06/2018 1431   HGB 14.3 01/10/2018 1612   HCT 43.5 10/06/2018 1431   HCT 42.5 01/10/2018 1612   PLT 157 10/06/2018 1431   PLT 165 01/10/2018 1612   MCV 102.6 (H) 10/06/2018 1431   MCV 98 (H) 01/10/2018 1612   MCH 32.5 10/06/2018 1431   MCHC 31.7 10/06/2018 1431   RDW 14.4 10/06/2018 1431   RDW 14.1 01/10/2018 1612   LYMPHSABS 0.8 10/06/2018 1431   MONOABS 1.0 10/06/2018 1431   EOSABS 0.0 10/06/2018 1431   BASOSABS 0.0 10/06/2018 1431   CMP  Component Value Date/Time   NA 136 10/06/2018 1431   NA 143 01/10/2018 1612   K 3.8 10/06/2018 1431   CL 102 10/06/2018 1431   CO2 24 10/06/2018 1431   GLUCOSE 88 10/06/2018 1431   BUN 12 10/06/2018 1431   BUN 15 01/10/2018 1612   CREATININE 0.93 10/06/2018 1431   CREATININE 1.18 (H) 05/06/2016 1232   CALCIUM 8.9 10/06/2018 1431   PROT 7.8 01/23/2018 1610   ALBUMIN 3.9 01/23/2018 1610   AST 22 01/23/2018 1610   ALT 9 01/23/2018 1610   ALKPHOS 202 (H) 01/23/2018 1610   BILITOT 1.2 01/23/2018 1610   GFRNONAA 58 (L) 10/06/2018 1431   GFRAA >60 10/06/2018 1431  Although she said she missed 2 doses of Coumadin, her INR is therapeutic.  Imaging I have reviewed the images obtained:  CT-scan of the brain-no acute changes.  Acute on chronic right sphenoid sinusitis noted incidentally.  Chronic paranasal sinusitis noted. Peri-ventricle white matter and corona radiata hypodensities favor chronic small vessel ischemia.  Old left frontal corona radiata lacunar infarct.   Assessment:  77 year old with multiple cerebrovascular risk factors, prior left MCA strokes with some residual speech and right-sided motor deficits- not very clear historian so could not elicit really good history but now  presenting with worsening right-sided deficits. Concerning for either recrudescence of stroke symptoms or a new stroke/TIA. Will benefit from further stroke work-up. Could just be recrudescence in the setting of her respiratory infection as she has a nagging cough that has been going on for a few days.  But again as said before, due to the multiple risk factors, would benefit from stroke work-up.  Impression: Evaluate for stroke/TIA Evaluate for stroke symptoms recrudescence in the setting of systemic infection/pneumonia/UTI  Recommendations: -Admit to hospitalist or observation -Telemetry monitoring -Allow for permissive hypertension for the first 24-48h - only treat PRN if SBP >220 mmHg. Blood pressures can be gradually normalized to SBP<140 upon discharge. -MRI brain without contrast -CT Angiogram of Head and neck -Echocardiogram -HgbA1c, fasting lipid panel -Frequent neuro checks -Prophylactic therapy-currently therapeutic on Coumadin.  Hold Coumadin for now until MRI is done.  If the MRI shows strokes, will need to hold Coumadin for a few days or change to another anticoagulant depending on location and size of strokes-further recommendations per stroke team after imaging is available.  Has a history of GI bleeds.  Would avoid giving her aspirin while she is therapeutic on Coumadin. -Atorvastatin 80 mg PO daily -Risk factor modification -PT consult, OT consult, Speech consult -Medicine team to evaluate for underlying infections.  Management per medicine team.  Spoke with the daughter at bedside in detail and gave her the option if you ask questions and answered all questions to the best of my ability.   Please page stroke NP/PA/MD (listed on AMION)  from 8am-4 pm as this patient will be followed by the stroke team at this point.  -- Milon Dikes, MD [facility] Pager: [phone] If 7pm to 7am, please call on call as listed on AMION.

## 2018-10-06 NOTE — ED Triage Notes (Signed)
Pt brought in by EMS due to having generalized weakness that started 4 days ago. Per pt family, pt has been "different". Pt a&ox3. Pt denies SOB, n/v, chest pain, or fevers.

## 2018-10-06 NOTE — ED Provider Notes (Signed)
Patient awake and alert on exam after results were available.  She does have mild ongoing confusion, and a conversation with her and family members is difficult to understand if this is new, or chronic. Patient now complains of transient left-sided extremity weakness earlier in the day.  And this is seemingly corroborated by family members, though it is clear the patient also has worsening generalized weakness. With a history of prior stroke, and her acknowledgment that she has been off of her Coumadin for several days, TIA remains a consideration, and I discussed her case with our neurology colleagues will follow as a consulting service. Patient will be admitted for further monitoring, management.   Joanna [name], Joanna Polinsky, MD 10/06/18 2001

## 2018-10-06 NOTE — ED Notes (Signed)
Pt in CT.

## 2018-10-06 NOTE — H&P (Signed)
History and Physical    Joanna [name] ZOX:[id number] DOB: 1941-06-22 DOA: 10/06/2018  PCP: [name], Elizabeth, MD  Patient coming from: home  Chief Complaint:  weakness  HPI: Joanna [name] is a 77 y.o. female with medical history significant of CKD, CVA, CHF, HTN comes in with family due to weakness seems to be more right sided and not acting normal for at least a month.  Pt has memory issues for at least the last month.  No fevers.  No n/v/d.  No noticabale facial weakness, no slurring, no drooling.  She has been leaning more to the right.  Difficulty walking.  At her baseline she walks independenly.  Lives with her daughter.  She still controls her medicines daily and gets mad when her daughter tries to assist in her daily meds.  She has not taken her coumadin in 2 days she says she ran out.  It is unknown if she is taking her medications correctly with the confusion she is having.  Pt being referred for admission for possible TIA/CVA.  She has been having a lot of congestion, no fever minimal cough.  Review of Systems: As per HPI otherwise 10 point review of systems negative.   Past Medical History:  Diagnosis Date  . Acute renal failure (HCC) 02/23/2007-03/22/2007   Now with normalization back to baseline  . Aphasia 2012  . CHF (congestive heart failure) (HCC)   . Chronic atrial fibrillation   . Chronic kidney disease   . Diverticulosis   . Dysnomia 12/2010  . Hypertension   . Ischemic bowel disease (HCC)    With GI bleeding  . Left ventricular dysfunction    EF is now 50-55% as of 2012  . Obesity   . Splenic infarct   . Stroke [facility](HCC) February 2012   Now back on coumadin    Past Surgical History:  Procedure Laterality Date  . COLECTOMY  12/2010   partial  . ILEOSTOMY    . OMENTECTOMY     partial with ileostomy     reports that she has never smoked. She has never used smokeless tobacco. She reports that she does not drink alcohol or use drugs.  No Known  Allergies  Family History  Problem Relation Age of Onset  . Heart disease Mother   . Hypertension Sister     Prior to Admission medications   Medication Sig Start Date End Date Taking? Authorizing Provider  diltiazem (CARTIA XT) 120 MG 24 hr capsule Take 1 capsule (120 mg total) by mouth daily. 01/18/18   Rosalio MacadamiaGerhardt, Lori C, NP  furosemide (LASIX) 40 MG tablet Take 1 tablet (40 mg total) by mouth every other day. Take 40 mg daily for 4 days, then take 40 mg every other day. 07/20/18   Berton BonHammond, Janine, NP  metoprolol tartrate (LOPRESSOR) 100 MG tablet Take 1 tablet (100 mg total) by mouth 2 (two) times daily. 02/15/18   Rosalio MacadamiaGerhardt, Lori C, NP  warfarin (COUMADIN) 5 MG tablet TAKE 1 TO 1 AND 1/2 TABLETS BY MOUTH ONCE DAILY AS DIRECTED BY  COUMADIN  CLINIC 10/03/18   [place]azilandJordan, Peter M, MD    Physical Exam: Vitals:   10/06/18 1900 10/06/18 1930 10/06/18 1945 10/06/18 2000  BP: 140/87 139/84  (!) 144/79  Pulse: 76 97 92 99  Resp:      Temp:      TempSrc:      SpO2: 98% 99% 96% 95%  Weight:      Height:  Constitutional: NAD, calm, comfortable, spunky Vitals:   10/06/18 1900 10/06/18 1930 10/06/18 1945 10/06/18 2000  BP: 140/87 139/84  (!) 144/79  Pulse: 76 97 92 99  Resp:      Temp:      TempSrc:      SpO2: 98% 99% 96% 95%  Weight:      Height:       Eyes: PERRL, lids and conjunctivae normal ENMT: Mucous membranes are moist. Posterior pharynx clear of any exudate or lesions.Normal dentition.  Neck: normal, supple, no masses, no thyromegaly Respiratory: clear to auscultation bilaterally, no wheezing, no crackles. Normal respiratory effort. No accessory muscle use.  Cardiovascular: Regular rate and rhythm, no murmurs / rubs / gallops. No extremity edema. 2+ pedal pulses. No carotid bruits.  Abdomen: no tenderness, no masses palpated. No hepatosplenomegaly. Bowel sounds positive.  Musculoskeletal: no clubbing / cyanosis. No joint deformity upper and lower extremities. Good ROM, no  contractures. Normal muscle tone.  Skin: no rashes, lesions, ulcers. No induration Neurologic: CN 2-12 grossly intact. Sensation intact, DTR normal. Strength 5/5 in all 4.  Psychiatric: Normal judgment and insight. Alert and oriented x 2. Normal mood.    Labs on Admission: I have personally reviewed following labs and imaging studies  CBC: Recent Labs  Lab 10/06/18 1431  WBC 4.8  NEUTROABS 2.9  HGB 13.8  HCT 43.5  MCV 102.6*  PLT 157   Basic Metabolic Panel: Recent Labs  Lab 10/06/18 1431  NA 136  K 3.8  CL 102  CO2 24  GLUCOSE 88  BUN 12  CREATININE 0.93  CALCIUM 8.9   GFR: Estimated Creatinine Clearance: 46.5 mL/min (by C-G formula based on SCr of 0.93 mg/dL). Liver Function Tests: No results for input(s): AST, ALT, ALKPHOS, BILITOT, PROT, ALBUMIN in the last 168 hours. No results for input(s): LIPASE, AMYLASE in the last 168 hours. No results for input(s): AMMONIA in the last 168 hours. Coagulation Profile: Recent Labs  Lab 10/06/18 1457  INR 2.20   Cardiac Enzymes: Recent Labs  Lab 10/06/18 1431  TROPONINI <0.03   BNP (last 3 results) No results for input(s): PROBNP in the last 8760 hours. HbA1C: No results for input(s): HGBA1C in the last 72 hours. CBG: No results for input(s): GLUCAP in the last 168 hours. Lipid Profile: No results for input(s): CHOL, HDL, LDLCALC, TRIG, CHOLHDL, LDLDIRECT in the last 72 hours. Thyroid Function Tests: No results for input(s): TSH, T4TOTAL, FREET4, T3FREE, THYROIDAB in the last 72 hours. Anemia Panel: No results for input(s): VITAMINB12, FOLATE, FERRITIN, TIBC, IRON, RETICCTPCT in the last 72 hours. Urine analysis:    Component Value Date/Time   COLORURINE AMBER (A) 10/06/2018 1457   APPEARANCEUR CLEAR 10/06/2018 1457   LABSPEC 1.019 10/06/2018 1457   PHURINE 5.0 10/06/2018 1457   GLUCOSEU NEGATIVE 10/06/2018 1457   HGBUR NEGATIVE 10/06/2018 1457   BILIRUBINUR NEGATIVE 10/06/2018 1457   KETONESUR 5 (A)  10/06/2018 1457   PROTEINUR NEGATIVE 10/06/2018 1457   UROBILINOGEN 4.0 (H) 07/02/2018 1706   NITRITE NEGATIVE 10/06/2018 1457   LEUKOCYTESUR NEGATIVE 10/06/2018 1457   Sepsis Labs: !!!!!!!!!!!!!!!!!!!!!!!!!!!!!!!!!!!!!!!!!!!! @LABRCNTIP (procalcitonin:4,lacticidven:4) )No results found for this or any previous visit (from the past 240 hour(s)).   Radiological Exams on Admission: Dg Chest 2 View  Result Date: 10/06/2018 CLINICAL DATA:  Weakness starting 4 days prior to imaging. EXAM: CHEST - 2 VIEW COMPARISON:  10/26/2014 FINDINGS: Prominent enlargement of the cardiopericardial silhouette, similar to prior. The right heart is particularly enlarged. Atherosclerotic calcification of  the aortic arch. Bandlike density in the right mid lung likely from atelectasis or scarring. The lungs appear otherwise clear. Mild thoracic spondylosis. No blunting of the costophrenic angles. Reduced right acromial humeral space implies potential rotator cuff tear. IMPRESSION: 1. Prominent but chronic enlargement of the cardiopericardial silhouette, without edema. 2. Right mid lung atelectasis or scarring. 3.  Aortic Atherosclerosis (ICD10-I70.0). 4. Thoracic spondylosis. Electronically Signed   By: Gaylyn Rong M.D.   On: 10/06/2018 16:39   Ct Head Wo Contrast  Result Date: 10/06/2018 CLINICAL DATA:  Weakness over the last 4 days EXAM: CT HEAD WITHOUT CONTRAST TECHNIQUE: Contiguous axial images were obtained from the base of the skull through the vertex without intravenous contrast. COMPARISON:  11/27/2016 MRI and CT scans FINDINGS: Brain: Periventricular white matter and corona radiata hypodensities favor chronic ischemic microvascular white matter disease. Remote left frontal lacunar infarct. Faint calcification of the lentiform nuclei, chronic. Otherwise, the brainstem, cerebellum, cerebral peduncles, thalami, basal ganglia, basilar cisterns, and ventricular system appear within normal limits. No intracranial  hemorrhage, mass lesion, or acute CVA. Vascular: Atherosclerotic calcification of the cavernous segment of the right internal carotid artery. Skull: Hyperostosis frontalis interna. Sinuses/Orbits: Considerable chronic maxillary and ethmoid sinusitis. Acute on chronic right sphenoid sinusitis. Other: No supplemental non-categorized findings. IMPRESSION: 1. Acute on chronic right sphenoid sinusitis. Chronic paranasal sinusitis. 2. No acute intracranial findings. 3. Periventricular white matter and corona radiata hypodensities favor chronic ischemic microvascular white matter disease. Old left frontal corona radiata lacunar infarct. Electronically Signed   By: Gaylyn Rong M.D.   On: 10/06/2018 16:43    EKG: Independently reviewed. afib rate controlled with diffuse twi no change from prior ekgs Old chart reviewed Case discussed with dr Jeraldine Loots in the ed   Assessment/Plan 77 yo female with confusion for a month and weakness for 2 weeks   Principal Problem:   TIA (transient ischemic attack) r/o.  May be due to misuse of medications also.  Have talked to pt and dtr about dtr overseeing her medication usage daily, pt is not happy about this suggestion as she feels she does not need any help.  Pt clearly not fully oriented to time and mildly easily confused.  Suspect may have underlying early dementia as this has been occurrring for at least a month.  Obtain mri brain, cta head /neck, cardiac echo.  Full stroke work up.  cxr neg, ua neg for infection.  freq neuro cks.  Ck hga1c and flp.  Holding coumadin for now and aspirin due to therapeutic inr.  Appreciate neuro input.  Active Problems:   Encounter for therapeutic drug monitoring- hold coumadin   Chronic atrial fibrillation- rate controlled   Chronic kidney disease- stable at baseline   Hypertension- clarify and hold meds at this time   CHF (congestive heart failure) (HCC)- stable and compensted at this time   Med rec pending pharm  reviewe   DVT prophylaxis:  coumadin Code Status: full Family Communication: dtr and son Disposition Plan:  Likely tom Consults called: neuro Admission status:  observation   Versa Craton A MD [facility]  If 7PM-7AM, please contact night-coverage [email or web address] Password [facility]  10/06/2018, 8:32 PM

## 2018-10-07 ENCOUNTER — Observation Stay (HOSPITAL_COMMUNITY): Payer: Medicare HMO

## 2018-10-07 ENCOUNTER — Encounter (HOSPITAL_COMMUNITY): Payer: Self-pay | Admitting: *Deleted

## 2018-10-07 ENCOUNTER — Other Ambulatory Visit (HOSPITAL_COMMUNITY): Payer: Medicare HMO

## 2018-10-07 DIAGNOSIS — Z5181 Encounter for therapeutic drug level monitoring: Secondary | ICD-10-CM | POA: Diagnosis not present

## 2018-10-07 DIAGNOSIS — I10 Essential (primary) hypertension: Secondary | ICD-10-CM | POA: Diagnosis not present

## 2018-10-07 DIAGNOSIS — J40 Bronchitis, not specified as acute or chronic: Secondary | ICD-10-CM

## 2018-10-07 DIAGNOSIS — M6281 Muscle weakness (generalized): Secondary | ICD-10-CM | POA: Diagnosis not present

## 2018-10-07 DIAGNOSIS — Z7901 Long term (current) use of anticoagulants: Secondary | ICD-10-CM | POA: Diagnosis not present

## 2018-10-07 DIAGNOSIS — Z8673 Personal history of transient ischemic attack (TIA), and cerebral infarction without residual deficits: Secondary | ICD-10-CM | POA: Diagnosis not present

## 2018-10-07 DIAGNOSIS — I482 Chronic atrial fibrillation, unspecified: Secondary | ICD-10-CM | POA: Diagnosis not present

## 2018-10-07 DIAGNOSIS — R471 Dysarthria and anarthria: Secondary | ICD-10-CM | POA: Diagnosis not present

## 2018-10-07 DIAGNOSIS — I130 Hypertensive heart and chronic kidney disease with heart failure and stage 1 through stage 4 chronic kidney disease, or unspecified chronic kidney disease: Secondary | ICD-10-CM | POA: Diagnosis not present

## 2018-10-07 DIAGNOSIS — N189 Chronic kidney disease, unspecified: Secondary | ICD-10-CM | POA: Diagnosis not present

## 2018-10-07 DIAGNOSIS — I509 Heart failure, unspecified: Secondary | ICD-10-CM | POA: Diagnosis not present

## 2018-10-07 DIAGNOSIS — I4891 Unspecified atrial fibrillation: Secondary | ICD-10-CM | POA: Diagnosis not present

## 2018-10-07 DIAGNOSIS — Z23 Encounter for immunization: Secondary | ICD-10-CM | POA: Diagnosis not present

## 2018-10-07 DIAGNOSIS — R0600 Dyspnea, unspecified: Secondary | ICD-10-CM | POA: Diagnosis not present

## 2018-10-07 DIAGNOSIS — R2681 Unsteadiness on feet: Secondary | ICD-10-CM | POA: Diagnosis not present

## 2018-10-07 DIAGNOSIS — R270 Ataxia, unspecified: Secondary | ICD-10-CM | POA: Diagnosis not present

## 2018-10-07 DIAGNOSIS — G459 Transient cerebral ischemic attack, unspecified: Secondary | ICD-10-CM | POA: Diagnosis not present

## 2018-10-07 LAB — LIPID PANEL
Cholesterol: 87 mg/dL (ref 0–200)
HDL: 22 mg/dL — ABNORMAL LOW (ref >40)
LDL Cholesterol: 53 mg/dL (ref 0–99)
Total CHOL/HDL Ratio: 4 RATIO
Triglycerides: 60 mg/dL (ref <150)
VLDL: 12 mg/dL (ref 0–40)

## 2018-10-07 LAB — PROTIME-INR
INR: 2.33
Prothrombin Time: 25.3 seconds — ABNORMAL HIGH (ref 11.4–15.2)

## 2018-10-07 LAB — HEMOGLOBIN A1C
HEMOGLOBIN A1C: 5.5 % (ref 4.8–5.6)
MEAN PLASMA GLUCOSE: 111.15 mg/dL

## 2018-10-07 LAB — URINE CULTURE: Culture: <10000 — AB

## 2018-10-07 MED ORDER — IOPAMIDOL (ISOVUE-370) INJECTION 76%
75.0000 mL | Freq: Once | INTRAVENOUS | Status: AC | PRN
  Administered 2018-10-07: 75 mL via INTRAVENOUS

## 2018-10-07 MED ORDER — WARFARIN SODIUM 5 MG PO TABS
5.0000 mg | ORAL_TABLET | Freq: Once | ORAL | Status: AC
  Administered 2018-10-07: 5 mg via ORAL
  Filled 2018-10-07: qty 1

## 2018-10-07 MED ORDER — METOPROLOL TARTRATE 25 MG PO TABS
25.0000 mg | ORAL_TABLET | Freq: Two times a day (BID) | ORAL | Status: DC
  Administered 2018-10-07 – 2018-10-08 (×3): 25 mg via ORAL
  Filled 2018-10-07 (×3): qty 1

## 2018-10-07 MED ORDER — DILTIAZEM HCL 30 MG PO TABS
30.0000 mg | ORAL_TABLET | Freq: Four times a day (QID) | ORAL | Status: DC
  Administered 2018-10-07 – 2018-10-09 (×10): 30 mg via ORAL
  Filled 2018-10-07 (×10): qty 1

## 2018-10-07 MED ORDER — IOPAMIDOL (ISOVUE-370) INJECTION 76%
INTRAVENOUS | Status: AC
  Filled 2018-10-07: qty 100

## 2018-10-07 MED ORDER — WARFARIN - PHARMACIST DOSING INPATIENT
Freq: Every day | Status: DC
  Administered 2018-10-08 – 2018-10-11 (×3)

## 2018-10-07 MED ORDER — ENSURE ENLIVE PO LIQD
237.0000 mL | Freq: Two times a day (BID) | ORAL | Status: DC
  Administered 2018-10-08: 237 mL via ORAL

## 2018-10-07 NOTE — Evaluation (Signed)
Speech Language Pathology Evaluation Patient Details Name: Joanna [name] MRN: [id number] DOB: 1941/03/31 Today's Date: 10/07/2018 Time: [id number] SLP Time Calculation (min) (ACUTE ONLY): 21 min  Problem List:  Patient Active Problem List   Diagnosis Date Noted  . TIA (transient ischemic attack) 10/06/2018  . Weakness   . Chronic kidney disease   . Hypertension   . CHF (congestive heart failure) (HCC)   . Stroke-like symptoms 11/27/2016  . Stroke (cerebrum) (HCC) 10/27/2015  . CVA (cerebral infarction) 10/26/2015  . Acute ischemic left MCA stroke (HCC) 10/26/2015  . Chronic atrial fibrillation 10/26/2015  . Encounter for therapeutic drug monitoring 01/03/2014  . Left ventricular dysfunction   . Atrial fibrillation (HCC) 02/04/2011  . Stroke [facility](HCC) 12/15/2010   Past Medical History:  Past Medical History:  Diagnosis Date  . Acute renal failure (HCC) 02/23/2007-03/22/2007   Now with normalization back to baseline  . Aphasia 2012  . CHF (congestive heart failure) (HCC)   . Chronic atrial fibrillation   . Chronic kidney disease   . Diverticulosis   . Dysnomia 12/2010  . Hypertension   . Ischemic bowel disease (HCC)    With GI bleeding  . Left ventricular dysfunction    EF is now 50-55% as of 2012  . Obesity   . Splenic infarct   . Stroke [facility](HCC) February 2012   Now back on coumadin   Past Surgical History:  Past Surgical History:  Procedure Laterality Date  . COLECTOMY  12/2010   partial  . ILEOSTOMY    . OMENTECTOMY     partial with ileostomy   HPI:  Patient is a 77 y/o female who presents with right sided weakness. Head CT and MRI-unremarkable. Concern for TIA. PMH includes A-fib on Coumadin, CHF, CKD, stroke, HTN, aphasia. Seen by SLP in 11/28/16 and scored 25/30 on MOCA, no aphasia or dysarthria noted 11/28/16 (pt with documented aphasia s/p CVA in 2012).    Assessment / Plan / Recommendation Clinical Impression   Pt presents with significant cognitive  communication impairment, with decline in function since last seen by SLP 11/28/16. At that time, pt scored 25/30 on MOCA (mild impairment); repeated a different version of this assessment today and pt scored 7/30 (severe impairement). Impaired sustained attention and slow processing, decreased awareness of deficits and safety, impaired verbal reasoning and problem solving. No family present and it is difficult to determine if this is an acute vs progressive decline. Given current cognition, recommend 24 hour supervision for safety. Will follow acutely.    SLP Assessment  SLP Recommendation/Assessment: Patient needs continued Speech Lanaguage Pathology Services SLP Visit Diagnosis: Cognitive communication deficit (R41.841)    Follow Up Recommendations  Home health SLP;24 hour supervision/assistance    Frequency and Duration min 1 x/week  1 week      SLP Evaluation Cognition  Overall Cognitive Status: Impaired/Different from baseline Arousal/Alertness: Awake/alert Orientation Level: Oriented to person;Oriented to place;Oriented to situation;Disoriented to time Attention: Sustained Sustained Attention: Impaired Sustained Attention Impairment: Verbal basic;Functional basic Memory: Impaired Memory Impairment: Storage deficit;Decreased recall of new information Awareness: Impaired Awareness Impairment: Intellectual impairment Problem Solving: Impaired Problem Solving Impairment: Verbal basic;Functional basic Executive Function: Reasoning;Sequencing Reasoning: Impaired Reasoning Impairment: Verbal basic;Functional basic Sequencing: Impaired Sequencing Impairment: Verbal basic;Functional basic Safety/Judgment: Impaired       Comprehension  Auditory Comprehension Overall Auditory Comprehension: Appears within functional limits for tasks assessed Visual Recognition/Discrimination Discrimination: Within Function Limits Reading Comprehension Reading Status: Not tested    Expression  Expression Primary Mode  of Expression: Verbal Verbal Expression Overall Verbal Expression: Impaired at baseline Naming: Impairment Confrontation: Impaired Divergent: 0-24% accurate Written Expression Dominant Hand: Right Written Expression: Not tested   Oral / Motor  Oral Motor/Sensory Function Overall Oral Motor/Sensory Function: Within functional limits Motor Speech Overall Motor Speech: Appears within functional limits for tasks assessed   GO                   Rondel Baton, MS, CCC-SLP Speech-Language Pathologist Acute Rehabilitation Services Pager: [phone] Office: [phone]  Joanna [name] 10/07/2018, 11:34 AM

## 2018-10-07 NOTE — Care Management Obs Status (Signed)
MEDICARE OBSERVATION STATUS NOTIFICATION   Patient Details  Name: Pricilla Riffleatricia A Henneman MRN: [id number] Date of Birth: 10/12/1941   Medicare Observation Status Notification Given:  Yes    Lawerance Sabalebbie Kainon Varady, RN 10/07/2018, 1:24 PM

## 2018-10-07 NOTE — Progress Notes (Signed)
ANTICOAGULATION CONSULT NOTE - Initial Consult  Pharmacy Consult for Coumadin Indication: atrial fibrillation  No Known Allergies  Patient Measurements: Height: 5\' 3"  (160 cm) Weight: 161 lb 2.5 oz (73.1 kg) IBW/kg (Calculated) : 52.4  Vital Signs: Temp: 98.6 F (37 C) (11/24 0926) Temp Source: Oral (11/24 0926) BP: 94/75 (11/24 0926) Pulse Rate: 107 (11/24 0926)  Labs: Recent Labs    10/06/18 1431 10/06/18 1457 10/07/18 0741  HGB 13.8  --   --   HCT 43.5  --   --   PLT 157  --   --   LABPROT  --  24.2* 25.3*  INR  --  2.20 2.33  CREATININE 0.93  --   --   TROPONINI <0.03  --   --     Estimated Creatinine Clearance: 47.8 mL/min (by C-G formula based on SCr of 0.93 mg/dL).   Medical History: Past Medical History:  Diagnosis Date  . Acute renal failure (HCC) 02/23/2007-03/22/2007   Now with normalization back to baseline  . Aphasia 2012  . CHF (congestive heart failure) (HCC)   . Chronic atrial fibrillation   . Chronic kidney disease   . Diverticulosis   . Dysnomia 12/2010  . Hypertension   . Ischemic bowel disease (HCC)    With GI bleeding  . Left ventricular dysfunction    EF is now 50-55% as of 2012  . Obesity   . Splenic infarct   . Stroke [facility](HCC) February 2012   Now back on coumadin    Medications:  Medications Prior to Admission  Medication Sig Dispense Refill Last Dose  . diltiazem (CARTIA XT) 120 MG 24 hr capsule Take 1 capsule (120 mg total) by mouth daily. 90 capsule 3 Taking  . furosemide (LASIX) 40 MG tablet Take 1 tablet (40 mg total) by mouth every other day. Take 40 mg daily for 4 days, then take 40 mg every other day. 30 tablet 0   . metoprolol tartrate (LOPRESSOR) 100 MG tablet Take 1 tablet (100 mg total) by mouth 2 (two) times daily. 180 tablet 3 Taking  . warfarin (COUMADIN) 5 MG tablet TAKE 1 TO 1 AND 1/2 TABLETS BY MOUTH ONCE DAILY AS DIRECTED BY  COUMADIN  CLINIC (Patient taking differently: Take 5-7.5 mg by mouth See admin instructions.  Take 1 1/2 tablets (7.5 mg) by mouth on Monday, Wednesday, Friday, take 1 tablet (5 mg) on Sunday, Tuesday, Thursday, Saturday) 15 tablet 0     Assessment: 77 y.o. female admitted with TIA, h/o Afib, to continue warfarin. Per H&P has taken warfarin in 2 days, but has been having memory issues so unknown if she is taking medications correctly. PTA warfarin dose 7.5 mg MWF  5 mg TTSS per last anticoag visit  INR 2.33 today, CBC WNL.    Goal of Therapy:  INR 2-3 Monitor platelets by anticoagulation protocol: Yes   Plan:  Warfarin 5mg  x1  Daily INR, monitor s/s bleeding  Ewing Schleinolton Whiteside, PharmD PGY1 Pharmacy Resident 10/07/2018    11:03 AM Please check AMION for all [facility] Pharmacy numbers

## 2018-10-07 NOTE — Evaluation (Signed)
Occupational Therapy Evaluation Patient Details Name: Joanna [name] MRN: [id number] DOB: 1941/09/16 Today's Date: 10/07/2018    History of Present Illness Patient is a 77 y/o female who presents with right sided weakness. Head CT and MRI-unremarkable. Concern for TIA. PMH includes A-fib on Coumadin, CHF, CKD, stroke, HTN, aphasia.   Clinical Impression   PTA, pt was living alone and was performing ADLs and light IADLs. Pt currently requiring Min A for ADLs and functional mobility due to poor balance. Pt presenting with decreased functional use of RUE, balance, and cognition increasing her fall risk. Pt would benefit from further acute OT to facilitate safe dc. Due to fall risk, pt will require increased support and unsure of family support with no family present at eval. Pending family support, recommend dc to home with HHOT for further OT to optimize safety, independence with ADLs, and return to PLOF.      Follow Up Recommendations  Home health OT;Supervision/Assistance - 24 hour    Equipment Recommendations  Tub/shower seat    Recommendations for Other Services PT consult;Speech consult     Precautions / Restrictions Precautions Precautions: Fall Restrictions Weight Bearing Restrictions: No      Mobility Bed Mobility Overal bed mobility: Needs Assistance Bed Mobility: Supine to Sit   Sidelying to sit: Min guard;HOB elevated       General bed mobility comments: Min guard A and significant amount of time for bed mobility  Transfers Overall transfer level: Needs assistance Equipment used: 1 person hand held assist Transfers: Sit to/from Stand Sit to Stand: Mod assist         General transfer comment: Mod A to power up into standing from EOB. Min A for gaining balance in standing    Balance Overall balance assessment: Needs assistance Sitting-balance support: Feet supported;No upper extremity supported Sitting balance-Leahy Scale: Fair     Standing balance  support: No upper extremity supported;During functional activity Standing balance-Leahy Scale: Poor Standing balance comment: Reliant on UE support or physical A                           ADL either performed or assessed with clinical judgement   ADL Overall ADL's : Needs assistance/impaired Eating/Feeding: Set up;Supervision/ safety;Sitting   Grooming: Wash/dry face;Minimal assistance;Standing Grooming Details (indicate cue type and reason): Min A for standing balance throughout. Min A to open bottle of soap due to decreased strength.  Upper Body Bathing: Minimal assistance;Sitting;Standing   Lower Body Bathing: Minimal assistance;Sit to/from stand   Upper Body Dressing : Minimal assistance;Sitting Upper Body Dressing Details (indicate cue type and reason): Min A to don old gown and change into new gown Lower Body Dressing: Minimal assistance;Sit to/from stand Lower Body Dressing Details (indicate cue type and reason): Pt performing peri care and washing her LB at sink after spilling tea while in bed. Min A for posterior lean and balance Toilet Transfer: Minimal assistance;Ambulation(simulated to recliner)   Toileting- Clothing Manipulation and Hygiene: Minimal assistance;Sit to/from stand       Functional mobility during ADLs: Minimal assistance(single hand held A) General ADL Comments: Pt presenting with poor cognition and balance     Vision Baseline Vision/History: Wears glasses Wears Glasses: Reading only Patient Visual Report: No change from baseline       Perception     Praxis      Pertinent Vitals/Pain Pain Assessment: No/denies pain     Hand Dominance Right   Extremity/Trunk Assessment Upper  Extremity Assessment Upper Extremity Assessment: RUE deficits/detail RUE Deficits / Details: Decreased strength and coordiantion at RUE. Limited shoulder ROM.  RUE Coordination: decreased fine motor;decreased gross motor   Lower Extremity Assessment Lower  Extremity Assessment: Defer to PT evaluation   Cervical / Trunk Assessment Cervical / Trunk Assessment: Kyphotic   Communication Communication Communication: Expressive difficulties   Cognition Arousal/Alertness: Awake/alert Behavior During Therapy: WFL for tasks assessed/performed Overall Cognitive Status: Impaired/Different from baseline Area of Impairment: Attention;Memory;Safety/judgement;Problem solving                   Current Attention Level: Sustained Memory: Decreased short-term memory   Safety/Judgement: Decreased awareness of deficits   Problem Solving: Slow processing;Requires verbal cues General Comments: Upon arrival, pt having spilt her tea in the bed; unsure if she also urinated in bed. Poor awareness of deficits and attention. Highly distracted. requiring increased cues throughout ADLs. Perseverating on topic of tea having spilt while she was in bed.   General Comments  No family present to comfirm home information    Exercises     Shoulder Instructions      Home Living Family/patient expects to be discharged to:: Private residence Living Arrangements: Alone Available Help at Discharge: Family;Available PRN/intermittently Type of Home: House Home Access: Stairs to enter [employer] of Steps: 4 Entrance Stairs-Rails: Right;Can reach both;Left Home Layout: One level     Bathroom Shower/Tub: Chief Strategy Officer: Standard(toilet riser)     Home Equipment: Toilet riser          Prior Functioning/Environment Level of Independence: Independent        Comments: ADLs, IADLs, and reports she drives short distances        OT Problem List: Decreased strength;Decreased range of motion;Decreased activity tolerance;Impaired balance (sitting and/or standing);Decreased cognition;Decreased safety awareness;Decreased knowledge of use of DME or AE;Impaired UE functional use      OT Treatment/Interventions: Self-care/ADL  training;Therapeutic exercise;Energy conservation;DME and/or AE instruction;Therapeutic activities;Patient/family education    OT Goals(Current goals can be found in the care plan section) Acute Rehab OT Goals Patient Stated Goal: to go home OT Goal Formulation: With patient Time For Goal Achievement: 10/21/18 Potential to Achieve Goals: Good  OT Frequency: Min 3X/week   Barriers to D/C: Other (comment)  Unsure of family support and will need 24/7 support at dc       Co-evaluation              AM-PAC OT "6 Clicks" Daily Activity     Outcome Measure Help from another person eating meals?: None Help from another person taking care of personal grooming?: A Little Help from another person toileting, which includes using toliet, bedpan, or urinal?: A Little Help from another person bathing (including washing, rinsing, drying)?: A Little Help from another person to put on and taking off regular upper body clothing?: A Little Help from another person to put on and taking off regular lower body clothing?: A Little 6 Click Score: 19   End of Session Equipment Utilized During Treatment: Gait belt Nurse Communication: Mobility status;Other (comment)(change sheets)  Activity Tolerance: Patient tolerated treatment well Patient left: in chair;with call bell/phone within reach;with chair alarm set  OT Visit Diagnosis: Unsteadiness on feet (R26.81);Other abnormalities of gait and mobility (R26.89);Muscle weakness (generalized) (M62.81);Other symptoms and signs involving cognitive function                Time: 4098-1191 OT Time Calculation (min): 27 min Charges:  OT General Charges $  OT Visit: 1 Visit OT Evaluation $OT Eval Moderate Complexity: 1 Mod OT Treatments $Self Care/Home Management : 8-22 mins  Terri Rorrer MSOT, OTR/L Acute Rehab Pager: [phone] Office: [phone]  Theodoro GristCharis M Alayiah Fontes 10/07/2018, 3:16 PM

## 2018-10-07 NOTE — Progress Notes (Signed)
Patient Demographics:    Joanna [name], is a 77 y.o. female, DOB - 1941-06-14, AOZ:[id number]  Admit date - 10/06/2018   Admitting Physician Haydee Monicaachal A David, MD  Outpatient Primary MD for the patient is [name], Elizabeth, MD  LOS - 0   Chief Complaint  Patient presents with  . Weakness        Subjective:    Joanna [name] today has no fevers, no emesis,  No chest pain, with no new concerns, no headaches no visual problems no swallowing difficulties right-sided weakness appears to be back to baseline  Assessment  & Plan :    Principal Problem:   TIA (transient ischemic attack) Active Problems:   Encounter for therapeutic drug monitoring   Chronic atrial fibrillation   Chronic kidney disease   Hypertension   CHF (congestive heart failure) [facility](HCC)  Brief Summary 77 y.o. female with medical history significant of CKD, CVA with residual right-sided hemiparesis, CHF, HTN admitted on 10/06/2018 due to concerns about worsening right-sided hemiparesis of recent URI symptoms family due to weakness seems to be more right sided and not acting normal for at least a month. .  No noticabale facial weakness, no slurring, no drooling  Plan:- 1)Recrudescence of previous stroke due to ongoing bronchitis  --- right-sided hemiparesis is back to baseline, no further new neuro deficits, CT head, MRI head, CTA head and neck all without acute findings except for Abnormal anterior mediastinal vascular structure incompletely imaged, this could reflect enlarged RIGHT atrium or aneurysm.  Neurologist recommended CTA chest for further evaluation this is pending at this time recommend CT angiogram chest.  Continue Coumadin therapy, A1c is 5.5, LDL is 53  2) right-sided hemiparesis and generalized weakness--- PT Drenda Freeze/speech and OT eval appreciated, recommend home health rehab  3) chronic atrial fibrillation--- INR is therapeutic  continue Coumadin, stable overall  Disposition/Need for in-Hospital Stay- patient unable to be discharged at this time due to need for further work-up to rule out possible aneurysm  Code Status : full   Disposition Plan  : TBD  Consults  : Neurology   DVT Prophylaxis  : Coumadin Lab Results  Component Value Date   PLT 157 10/06/2018    Inpatient Medications  Scheduled Meds: . diltiazem  30 mg Oral QID  . feeding supplement (ENSURE ENLIVE)  237 mL Oral BID BM  . guaiFENesin  1,200 mg Oral BID  . metoprolol tartrate  25 mg Oral BID  . Warfarin - Pharmacist Dosing Inpatient   Does not apply q1800   Continuous Infusions: PRN Meds:.acetaminophen **OR** acetaminophen (TYLENOL) oral liquid 160 mg/5 mL **OR** acetaminophen    Anti-infectives (From admission, onward)   None        Objective:   Vitals:   10/07/18 0530 10/07/18 0926 10/07/18 1230 10/07/18 1632  BP: (!) 149/93 94/75 137/81 (!) 150/89  Pulse: (!) 107 (!) 107 95 (!) 116  Resp: 16 16 16 16   Temp: 98.7 F (37.1 C) 98.6 F (37 C) 98.1 F (36.7 C) 98.3 F (36.8 C)  TempSrc: Oral Oral Oral Oral  SpO2: 97% 98%  97%  Weight:      Height:        Wt Readings from Last 3 Encounters:  10/06/18 73.1 kg  07/18/18 72.6 kg  01/10/18 79.1 kg     Intake/Output Summary (Last 24 hours) at 10/07/2018 Paulo Fruit Last data filed at 10/07/2018 1300 Gross per 24 hour  Intake 600 ml  Output -  Net 600 ml     Physical Exam Patient is examined daily including today on 10/07/18 , exams remain the same as of yesterday except that has changed   Gen:- Awake Alert,  In no apparent distress  HEENT:- [place].AT, No sclera icterus Neck-Supple Neck,No JVD,.  Lungs-  CTAB , fair symmetrical air movement CV- S1, S2 normal, irrregular  Abd-  +ve B.Sounds, Abd Soft, No tenderness,    Extremity/Skin:- No  edema, pedal pulses present  Psych-affect is appropriate, oriented x3 Neuro-no new focal deficits, no tremors, residual right  hemiparesis appears to be at baseline GU---RN noted discoloration and bumps in the vaginal area, no acute findings,    Data Review:   Micro Results Recent Results (from the past 240 hour(s))  Urine culture     Status: Abnormal   Collection Time: 10/06/18  5:59 PM  Result Value Ref Range Status   Specimen Description URINE, RANDOM  Final   Special Requests NONE  Final   Culture (A)  Final    <10,000 COLONIES/mL INSIGNIFICANT GROWTH Performed at [facility], [address] [place], [place] [address]    Report Status 10/07/2018 FINAL  Final    Radiology Reports Ct Angio Head W Or Wo Contrast  Result Date: 10/06/2018 CLINICAL DATA:  Generalized weakness for 4 days. Assess TIA. History of stroke, hypertension and atrial fibrillation. EXAM: CT ANGIOGRAPHY HEAD AND NECK TECHNIQUE: Multidetector CT imaging of the head and neck was performed using the standard protocol during bolus administration of intravenous contrast. Multiplanar CT image reconstructions and MIPs were obtained to evaluate the vascular anatomy. Carotid stenosis measurements (when applicable) are obtained utilizing NASCET criteria, using the distal internal carotid diameter as the denominator. CONTRAST:  ISOVUE-370 IOPAMIDOL (ISOVUE-370) INJECTION 76% COMPARISON:  CT HEAD October 06, 2018 and MRI/MRA head November 27, 2017. FINDINGS: CTA NECK FINDINGS: AORTIC ARCH: Thoracic aorta is normal in course and caliber. Average RIGHT subclavian artery coursing posterior to trachea and esophagus. Moderate to severe calcific atherosclerosis arch vessel origins. Arch vessel origins are patent. RIGHT CAROTID SYSTEM: Common carotid artery is patent, moderate calcific atherosclerosis. Calcific atherosclerosis resulting in less than 50% stenosis by NASCET criteria. Patent internal carotid artery. LEFT CAROTID SYSTEM: Common carotid artery is patent, moderate calcific atherosclerosis. Calcific atherosclerosis resulting in less than 50%  stenosis by NASCET criteria. Patent internal carotid artery. VERTEBRAL ARTERIES:Left vertebral artery is dominant. Moderate stenosis LEFT vertebral artery origin. Patent vertebral arteries with mild extrinsic compression due to degenerative change. SKELETON: No acute osseous process though bone windows have not been submitted. Severe bilateral C3-4, RIGHT C4-5 neural foraminal narrowing. OTHER NECK: Soft tissues of the neck are nonacute though, not tailored for evaluation. UPPER CHEST: Included lung apices are clear. Cardiomegaly. Abnormal enhancement anterior to the superior vena cava and ascending aortic, appearing vascular. CTA HEAD FINDINGS: ANTERIOR CIRCULATION: Patent cervical internal carotid arteries, petrous, cavernous and supra clinoid internal carotid arteries. Calcific atherosclerosis resulting in moderate stenosis RIGHT carotid siphon. Patent anterior communicating artery. Patent anterior and middle cerebral arteries, moderate luminal irregularity compatible with atherosclerosis. Severe stenosis LEFT A2 segment with transient suspected occlusion. Severe stenosis RIGHT M3 segment. No large vessel occlusion, contrast extravasation or aneurysm. POSTERIOR CIRCULATION: Patent vertebral arteries, vertebrobasilar junction and basilar artery, as well as main  branch vessels. Patent posterior cerebral arteries, moderate luminal irregularity compatible with atherosclerosis. No large vessel occlusion, significant stenosis, contrast extravasation or aneurysm. VENOUS SINUSES: Major dural venous sinuses are patent though not tailored for evaluation on this angiographic examination. ANATOMIC VARIANTS: None. DELAYED PHASE: No abnormal intracranial enhancement. MIP images reviewed. IMPRESSION: CTA NECK: 1. Less than 50% stenosis bilateral ICA's. 2. Patent vertebral arteries, moderate stenosis LEFT V1 origin. 3. Abnormal anterior mediastinal vascular structure incompletely imaged, this could reflect enlarged RIGHT atrium  or aneurysm. Recommend CT angiogram chest. 4. Severe C3-4 and C4-5 neural foraminal narrowing. CTA HEAD: 1. No emergent large vessel occlusion. 2. Severe LEFT A2 and RIGHT M3 stenosis. 3. Moderate intracranial atherosclerosis. Electronically Signed   By: Awilda Metro M.D.   On: 10/06/2018 21:54   Dg Chest 2 View  Result Date: 10/06/2018 CLINICAL DATA:  Weakness starting 4 days prior to imaging. EXAM: CHEST - 2 VIEW COMPARISON:  10/26/2014 FINDINGS: Prominent enlargement of the cardiopericardial silhouette, similar to prior. The right heart is particularly enlarged. Atherosclerotic calcification of the aortic arch. Bandlike density in the right mid lung likely from atelectasis or scarring. The lungs appear otherwise clear. Mild thoracic spondylosis. No blunting of the costophrenic angles. Reduced right acromial humeral space implies potential rotator cuff tear. IMPRESSION: 1. Prominent but chronic enlargement of the cardiopericardial silhouette, without edema. 2. Right mid lung atelectasis or scarring. 3.  Aortic Atherosclerosis (ICD10-I70.0). 4. Thoracic spondylosis. Electronically Signed   By: Gaylyn Rong M.D.   On: 10/06/2018 16:39   Ct Head Wo Contrast  Result Date: 10/06/2018 CLINICAL DATA:  Weakness over the last 4 days EXAM: CT HEAD WITHOUT CONTRAST TECHNIQUE: Contiguous axial images were obtained from the base of the skull through the vertex without intravenous contrast. COMPARISON:  11/27/2016 MRI and CT scans FINDINGS: Brain: Periventricular white matter and corona radiata hypodensities favor chronic ischemic microvascular white matter disease. Remote left frontal lacunar infarct. Faint calcification of the lentiform nuclei, chronic. Otherwise, the brainstem, cerebellum, cerebral peduncles, thalami, basal ganglia, basilar cisterns, and ventricular system appear within normal limits. No intracranial hemorrhage, mass lesion, or acute CVA. Vascular: Atherosclerotic calcification of the  cavernous segment of the right internal carotid artery. Skull: Hyperostosis frontalis interna. Sinuses/Orbits: Considerable chronic maxillary and ethmoid sinusitis. Acute on chronic right sphenoid sinusitis. Other: No supplemental non-categorized findings. IMPRESSION: 1. Acute on chronic right sphenoid sinusitis. Chronic paranasal sinusitis. 2. No acute intracranial findings. 3. Periventricular white matter and corona radiata hypodensities favor chronic ischemic microvascular white matter disease. Old left frontal corona radiata lacunar infarct. Electronically Signed   By: Gaylyn Rong M.D.   On: 10/06/2018 16:43   Ct Angio Neck W Or Wo Contrast  Result Date: 10/06/2018 CLINICAL DATA:  Generalized weakness for 4 days. Assess TIA. History of stroke, hypertension and atrial fibrillation. EXAM: CT ANGIOGRAPHY HEAD AND NECK TECHNIQUE: Multidetector CT imaging of the head and neck was performed using the standard protocol during bolus administration of intravenous contrast. Multiplanar CT image reconstructions and MIPs were obtained to evaluate the vascular anatomy. Carotid stenosis measurements (when applicable) are obtained utilizing NASCET criteria, using the distal internal carotid diameter as the denominator. CONTRAST:  ISOVUE-370 IOPAMIDOL (ISOVUE-370) INJECTION 76% COMPARISON:  CT HEAD October 06, 2018 and MRI/MRA head November 27, 2017. FINDINGS: CTA NECK FINDINGS: AORTIC ARCH: Thoracic aorta is normal in course and caliber. Average RIGHT subclavian artery coursing posterior to trachea and esophagus. Moderate to severe calcific atherosclerosis arch vessel origins. Arch vessel origins are patent. RIGHT CAROTID  SYSTEM: Common carotid artery is patent, moderate calcific atherosclerosis. Calcific atherosclerosis resulting in less than 50% stenosis by NASCET criteria. Patent internal carotid artery. LEFT CAROTID SYSTEM: Common carotid artery is patent, moderate calcific atherosclerosis. Calcific  atherosclerosis resulting in less than 50% stenosis by NASCET criteria. Patent internal carotid artery. VERTEBRAL ARTERIES:Left vertebral artery is dominant. Moderate stenosis LEFT vertebral artery origin. Patent vertebral arteries with mild extrinsic compression due to degenerative change. SKELETON: No acute osseous process though bone windows have not been submitted. Severe bilateral C3-4, RIGHT C4-5 neural foraminal narrowing. OTHER NECK: Soft tissues of the neck are nonacute though, not tailored for evaluation. UPPER CHEST: Included lung apices are clear. Cardiomegaly. Abnormal enhancement anterior to the superior vena cava and ascending aortic, appearing vascular. CTA HEAD FINDINGS: ANTERIOR CIRCULATION: Patent cervical internal carotid arteries, petrous, cavernous and supra clinoid internal carotid arteries. Calcific atherosclerosis resulting in moderate stenosis RIGHT carotid siphon. Patent anterior communicating artery. Patent anterior and middle cerebral arteries, moderate luminal irregularity compatible with atherosclerosis. Severe stenosis LEFT A2 segment with transient suspected occlusion. Severe stenosis RIGHT M3 segment. No large vessel occlusion, contrast extravasation or aneurysm. POSTERIOR CIRCULATION: Patent vertebral arteries, vertebrobasilar junction and basilar artery, as well as main branch vessels. Patent posterior cerebral arteries, moderate luminal irregularity compatible with atherosclerosis. No large vessel occlusion, significant stenosis, contrast extravasation or aneurysm. VENOUS SINUSES: Major dural venous sinuses are patent though not tailored for evaluation on this angiographic examination. ANATOMIC VARIANTS: None. DELAYED PHASE: No abnormal intracranial enhancement. MIP images reviewed. IMPRESSION: CTA NECK: 1. Less than 50% stenosis bilateral ICA's. 2. Patent vertebral arteries, moderate stenosis LEFT V1 origin. 3. Abnormal anterior mediastinal vascular structure incompletely  imaged, this could reflect enlarged RIGHT atrium or aneurysm. Recommend CT angiogram chest. 4. Severe C3-4 and C4-5 neural foraminal narrowing. CTA HEAD: 1. No emergent large vessel occlusion. 2. Severe LEFT A2 and RIGHT M3 stenosis. 3. Moderate intracranial atherosclerosis. Electronically Signed   By: Awilda Metro M.D.   On: 10/06/2018 21:54   Mr Brain Wo Contrast  Result Date: 10/07/2018 CLINICAL DATA:  Ataxia. EXAM: MRI HEAD WITHOUT CONTRAST TECHNIQUE: Multiplanar, multiecho pulse sequences of the brain and surrounding structures were obtained without intravenous contrast. COMPARISON:  CTA head neck 10/06/2018 FINDINGS: BRAIN: There is no acute infarct, acute hemorrhage, hydrocephalus or extra-axial collection. The midline structures are normal. No midline shift or other mass effect. Old, small left frontal infarct. Multifocal white matter hyperintensity, most commonly due to chronic ischemic microangiopathy. The cerebral and cerebellar volume are age-appropriate. Susceptibility-sensitive sequences show no chronic microhemorrhage or superficial siderosis. VASCULAR: Major intracranial arterial and venous sinus flow voids are normal. SKULL AND UPPER CERVICAL SPINE: Calvarial bone marrow signal is normal. There is no skull base mass. Visualized upper cervical spine and soft tissues are normal. SINUSES/ORBITS: Moderate-to-severe bilateral maxillary and ethmoid sinus mucosal thickening. Mild right sphenoid mucosal thickening. No mastoid or middle ear effusion. The orbits are normal. IMPRESSION: 1. No acute intracranial abnormality. 2. Chronic small vessel ischemia and old left frontal infarct. 3. Moderate paranasal sinus disease. Electronically Signed   By: Deatra Robinson M.D.   On: 10/07/2018 05:25     CBC Recent Labs  Lab 10/06/18 1431  WBC 4.8  HGB 13.8  HCT 43.5  PLT 157  MCV 102.6*  MCH 32.5  MCHC 31.7  RDW 14.4  LYMPHSABS 0.8  MONOABS 1.0  EOSABS 0.0  BASOSABS 0.0    Chemistries    Recent Labs  Lab 10/06/18 1431  NA 136  K  3.8  CL 102  CO2 24  GLUCOSE 88  BUN 12  CREATININE 0.93  CALCIUM 8.9   ------------------------------------------------------------------------------------------------------------------ Recent Labs    10/07/18 0546  CHOL 87  HDL 22*  LDLCALC 53  TRIG 60  CHOLHDL 4.0    Lab Results  Component Value Date   HGBA1C 5.5 10/07/2018   ------------------------------------------------------------------------------------------------------------------ No results for input(s): TSH, T4TOTAL, T3FREE, THYROIDAB in the last 72 hours.  Invalid input(s): FREET3 ------------------------------------------------------------------------------------------------------------------ No results for input(s): VITAMINB12, FOLATE, FERRITIN, TIBC, IRON, RETICCTPCT in the last 72 hours.  Coagulation profile Recent Labs  Lab 10/06/18 1457 10/07/18 0741  INR 2.20 2.33    No results for input(s): DDIMER in the last 72 hours.  Cardiac Enzymes Recent Labs  Lab 10/06/18 1431  TROPONINI <0.03   ------------------------------------------------------------------------------------------------------------------    Component Value Date/Time   BNP 789.0 (H) 09/01/2015 1243     Shon Hale M.D on 10/07/2018 at 6:38 PM  Pager---[phone] Go to [email or web address] - password [email or web address] for contact info  [facility] - Office  [phone]

## 2018-10-07 NOTE — Evaluation (Addendum)
Physical Therapy Evaluation Patient Details Name: Joanna [name] MRN: [id number] DOB: Sep 10, 1941 Today's Date: 10/07/2018   History of Present Illness  Patient is a 77 y/o female who presents with right sided weakness. Head CT and MRI-unremarkable. Concern for TIA. PMH includes A-fib on Coumadin, CHF, CKD, stroke, HTN, aphasia.  Clinical Impression  Patient presents with right sided weakness, impaired attention, expressive deficits, impaired balance and impaired mobility s/p above. Pt with poor awareness of deficits- not sure which of these are new?? Tolerated gait training and performing ADLs at sink with Min A for balance. Pt reports independent with ADLs PTA and living alone. Not sure if accuracy of reported PLOF. If so, pt not at functional baseline. Reports having family close by but will likely need initial supervision for OOB mobility. Would try AD next session to improve balance. Education re: signs/symptoms of stroke. Will follow acutely to maximize independence and mobility prior to return home.     Follow Up Recommendations Home health PT;Supervision for mobility/OOB    Equipment Recommendations  Other (comment)(TBA)    Recommendations for Other Services       Precautions / Restrictions Precautions Precautions: Fall Restrictions Weight Bearing Restrictions: No      Mobility  Bed Mobility Overal bed mobility: Needs Assistance Bed Mobility: Rolling;Sidelying to Sit Rolling: Supervision Sidelying to sit: Supervision;HOB elevated       General bed mobility comments: Use of rail to get to EOB, no assist needed, increased time.   Transfers Overall transfer level: Needs assistance Equipment used: None Transfers: Sit to/from Stand Sit to Stand: Min assist         General transfer comment: Min A to steady in standing. Stood from [employer], transferred to chair post ambulation.  Ambulation/Gait Ambulation/Gait assistance: Min assist Gait Distance (Feet): 30  Feet Assistive device: 1 person hand held assist Gait Pattern/deviations: Step-through pattern;Decreased stance time - right;Decreased step length - left;Narrow base of support Gait velocity: decreased   General Gait Details: Slow, mildly unsteady gait with right knee instability noted but no buckling. HHA and other UE reaching for furniture for support.   Stairs            Wheelchair Mobility    Modified Rankin (Stroke Patients Only) Modified Rankin (Stroke Patients Only) Pre-Morbid Rankin Score: Moderate disability Modified Rankin: Moderately severe disability     Balance Overall balance assessment: Needs assistance Sitting-balance support: Feet supported;No upper extremity supported Sitting balance-Leahy Scale: Good     Standing balance support: During functional activity Standing balance-Leahy Scale: Fair Standing balance comment: Able to stand at sink and brush teeth/wash face leaning on counter for support.                              Pertinent Vitals/Pain Pain Assessment: No/denies pain    Home Living Family/patient expects to be discharged to:: Private residence Living Arrangements: Alone Available Help at Discharge: Family;Available PRN/intermittently Type of Home: House Home Access: Stairs to enter Entrance Stairs-Rails: Right;Can reach Technical sales engineer of Steps: 4 Home Layout: One level Home Equipment: None      Prior Function Level of Independence: Independent         Comments: has not driven in awhile. Daughter helps as needed.      Hand Dominance   Dominant Hand: Right    Extremity/Trunk Assessment   Upper Extremity Assessment Upper Extremity Assessment: Defer to OT evaluation(Coordination WFL BUEs, sensation WFL.)  Lower Extremity Assessment Lower Extremity Assessment: RLE deficits/detail RLE Deficits / Details: Grossly ~2/5 hip flexion, 3+/5 knee extension, 3/5 knee flexion, limited DF AROM. no  clonus RLE Sensation: WNL RLE Coordination: WNL       Communication   Communication: Expressive difficulties  Cognition Arousal/Alertness: Awake/alert Behavior During Therapy: WFL for tasks assessed/performed Overall Cognitive Status: Impaired/Different from baseline Area of Impairment: Attention;Memory;Safety/judgement;Problem solving                   Current Attention Level: Sustained Memory: Decreased short-term memory   Safety/Judgement: Decreased awareness of deficits   Problem Solving: Slow processing;Requires verbal cues General Comments: Pt with decreased awareness of deficits. Word finding difficulties noted at times. easily distracted.       General Comments General comments (skin integrity, edema, etc.): no family members present to corroborate PLOF/history/home setup. Per chart, pt might be living with daughter but pt reports she lives alone.    Exercises     Assessment/Plan    PT Assessment Patient needs continued PT services  PT Problem List Decreased strength;Decreased mobility;Decreased safety awareness;Decreased balance;Decreased cognition       PT Treatment Interventions Functional mobility training;Balance training;Patient/family education;Gait training;Therapeutic activities;Stair training;Therapeutic exercise;Neuromuscular re-education;DME instruction;Cognitive remediation    PT Goals (Current goals can be found in the Care Plan section)  Acute Rehab PT Goals Patient Stated Goal: to go home PT Goal Formulation: With patient Time For Goal Achievement: 10/21/18 Potential to Achieve Goals: Good    Frequency Min 4X/week   Barriers to discharge Decreased caregiver support;Inaccessible home environment lives alone; stairs to enter home    Co-evaluation               AM-PAC PT "6 Clicks" Mobility  Outcome Measure Help needed turning from your back to your side while in a flat bed without using bedrails?: A Little Help needed moving  from lying on your back to sitting on the side of a flat bed without using bedrails?: A Little Help needed moving to and from a bed to a chair (including a wheelchair)?: A Little Help needed standing up from a chair using your arms (e.g., wheelchair or bedside chair)?: A Little Help needed to walk in hospital room?: A Little Help needed climbing 3-5 steps with a railing? : A Little 6 Click Score: 18    End of Session Equipment Utilized During Treatment: Gait belt Activity Tolerance: Patient tolerated treatment well Patient left: in chair;with call bell/phone within reach;with chair alarm set Nurse Communication: Mobility status PT Visit Diagnosis: Muscle weakness (generalized) (M62.81);Difficulty in walking, not elsewhere classified (R26.2);Hemiplegia and hemiparesis;Unsteadiness on feet (R26.81) Hemiplegia - Right/Left: Right Hemiplegia - dominant/non-dominant: Dominant Hemiplegia - caused by: Unspecified    Time: [id number] PT Time Calculation (min) (ACUTE ONLY): 31 min   Charges:   PT Evaluation $PT Eval Moderate Complexity: 1 Mod PT Treatments $Therapeutic Activity: 8-22 mins        Mylo RedShauna Mame Twombly, PT, DPT Acute Rehabilitation Services Pager [phone] Office [phone]      Blake DivineShauna A Lanier EnsignHartshorne 10/07/2018, 9:37 AM

## 2018-10-07 NOTE — Progress Notes (Signed)
ANTICOAGULATION CONSULT NOTE - Initial Consult  Pharmacy Consult for Coumadin Indication: atrial fibrillation  No Known Allergies  Patient Measurements: Height: 5\' 3"  (160 cm) Weight: 161 lb 2.5 oz (73.1 kg) IBW/kg (Calculated) : 52.4  Vital Signs: Temp: 98.7 F (37.1 C) (11/24 0530) Temp Source: Oral (11/24 0530) BP: 149/93 (11/24 0530) Pulse Rate: 107 (11/24 0530)  Labs: Recent Labs    10/06/18 1431 10/06/18 1457  HGB 13.8  --   HCT 43.5  --   PLT 157  --   LABPROT  --  24.2*  INR  --  2.20  CREATININE 0.93  --   TROPONINI <0.03  --     Estimated Creatinine Clearance: 47.8 mL/min (by C-G formula based on SCr of 0.93 mg/dL).   Medical History: Past Medical History:  Diagnosis Date  . Acute renal failure (HCC) 02/23/2007-03/22/2007   Now with normalization back to baseline  . Aphasia 2012  . CHF (congestive heart failure) (HCC)   . Chronic atrial fibrillation   . Chronic kidney disease   . Diverticulosis   . Dysnomia 12/2010  . Hypertension   . Ischemic bowel disease (HCC)    With GI bleeding  . Left ventricular dysfunction    EF is now 50-55% as of 2012  . Obesity   . Splenic infarct   . Stroke [facility](HCC) February 2012   Now back on coumadin    Medications:  Medications Prior to Admission  Medication Sig Dispense Refill Last Dose  . diltiazem (CARTIA XT) 120 MG 24 hr capsule Take 1 capsule (120 mg total) by mouth daily. 90 capsule 3 Taking  . furosemide (LASIX) 40 MG tablet Take 1 tablet (40 mg total) by mouth every other day. Take 40 mg daily for 4 days, then take 40 mg every other day. 30 tablet 0   . metoprolol tartrate (LOPRESSOR) 100 MG tablet Take 1 tablet (100 mg total) by mouth 2 (two) times daily. 180 tablet 3 Taking  . warfarin (COUMADIN) 5 MG tablet TAKE 1 TO 1 AND 1/2 TABLETS BY MOUTH ONCE DAILY AS DIRECTED BY  COUMADIN  CLINIC (Patient taking differently: Take 5-7.5 mg by mouth See admin instructions. Take 1 1/2 tablets (7.5 mg) by mouth on Monday,  Wednesday, Friday, take 1 tablet (5 mg) on Sunday, Tuesday, Thursday, Saturday) 15 tablet 0     Assessment: 77 y.o. female admitted with TIA, h/o Afib, to continue Coumadin.  Goal of Therapy:  INR 2-3 Monitor platelets by anticoagulation protocol: Yes   Plan:  F/U today's INR and resume Coumadin.  Eddie Candlebbott, Tyshia Fenter Vernon 10/07/2018,7:06 AM

## 2018-10-07 NOTE — Progress Notes (Addendum)
STROKE TEAM PROGRESS NOTE   SUBJECTIVE (INTERVAL HISTORY)  No family at bedside.  Patient OT in room.  Patient still coughing with secretions.  Chest x-ray negative for pneumonia.  Likely she has bronchitis which she stated has been going on for 2 weeks.  She is to have right sided weakness which is baseline residual deficit from previous stroke.  Patient felt she is at baseline right now.  INR 2.33.  MRI no stroke.  OBJECTIVE Vitals:   10/07/18 0130 10/07/18 0330 10/07/18 0530 10/07/18 0926  BP: 136/81 135/77 (!) 149/93 94/75  Pulse: (!) 108 100 (!) 107 (!) 107  Resp: 16 16 16 16   Temp: 98.2 F (36.8 C) 98.5 F (36.9 C) 98.7 F (37.1 C) 98.6 F (37 C)  TempSrc: Oral Oral Oral Oral  SpO2: 97% 95% 97% 98%  Weight:      Height:        CBC:  Recent Labs  Lab 10/06/18 1431  WBC 4.8  NEUTROABS 2.9  HGB 13.8  HCT 43.5  MCV 102.6*  PLT 157    Basic Metabolic Panel:  Recent Labs  Lab 10/06/18 1431  NA 136  K 3.8  CL 102  CO2 24  GLUCOSE 88  BUN 12  CREATININE 0.93  CALCIUM 8.9    Lipid Panel:     Component Value Date/Time   CHOL 87 10/07/2018 0546   TRIG 60 10/07/2018 0546   HDL 22 (L) 10/07/2018 0546   CHOLHDL 4.0 10/07/2018 0546   VLDL 12 10/07/2018 0546   LDLCALC 53 10/07/2018 0546   HgbA1c:  Lab Results  Component Value Date   HGBA1C 5.5 10/07/2018   Urine Drug Screen:     Component Value Date/Time   LABOPIA NONE DETECTED 10/26/2015 2004   COCAINSCRNUR NONE DETECTED 10/26/2015 2004   LABBENZ NONE DETECTED 10/26/2015 2004   AMPHETMU NONE DETECTED 10/26/2015 2004   THCU NONE DETECTED 10/26/2015 2004   LABBARB NONE DETECTED 10/26/2015 2004    Alcohol Level     Component Value Date/Time   ETH <5 10/26/2015 1825    IMAGING   Ct Angio Head W Or Wo Contrast Ct Angio Neck W Or Wo Contrast 10/06/2018 IMPRESSION:   CTA NECK:  1. Less than 50% stenosis bilateral ICA's.  2. Patent vertebral arteries, moderate stenosis LEFT V1 origin.  3.  Abnormal anterior mediastinal vascular structure incompletely imaged, this could reflect enlarged RIGHT atrium or aneurysm. Recommend CT angiogram chest.  4. Severe C3-4 and C4-5 neural foraminal narrowing.   CTA HEAD:  1. No emergent large vessel occlusion.  2. Severe LEFT A2 and RIGHT M3 stenosis.  3. Moderate intracranial atherosclerosis.    Dg Chest 2 View 10/06/2018 IMPRESSION:  1. Prominent but chronic enlargement of the cardiopericardial silhouette, without edema.  2. Right mid lung atelectasis or scarring.  3.  Aortic Atherosclerosis (ICD10-I70.0).  4. Thoracic spondylosis.    Ct Head Wo Contrast 10/06/2018 IMPRESSION:  1. Acute on chronic right sphenoid sinusitis. Chronic paranasal sinusitis.  2. No acute intracranial findings.  3. Periventricular white matter and corona radiata hypodensities favor chronic ischemic microvascular white matter disease. Old left frontal corona radiata lacunar infarct.    Mr Brain Wo Contrast 10/07/2018 IMPRESSION:  1. No acute intracranial abnormality.  2. Chronic small vessel ischemia and old left frontal infarct.  3. Moderate paranasal sinus disease.    Transthoracic Echocardiogram - pending 00/00/00    PHYSICAL EXAM  Temp:  [98.1 F (36.7 C)-100.1 F (37.8 C)]  98.1 F (36.7 C) (11/24 1230) Pulse Rate:  [76-108] 95 (11/24 1230) Resp:  [15-25] 16 (11/24 1230) BP: (94-166)/(75-104) 137/81 (11/24 1230) SpO2:  [92 %-99 %] 98 % (11/24 0926) Weight:  [73.1 kg] 73.1 kg (11/23 2147)  General - Well nourished, well developed, in no apparent distress, but intermittent coughing with secretions.  Ophthalmologic - fundi not visualized due to noncooperation.  Cardiovascular - irregularly irregular heart rate and rhythm.  Mental Status -  Level of arousal and orientation to month, place, and person were intact, but not orientated to year. Language including expression, naming, repetition, comprehension was assessed and found  intact, mild dysarthria  Cranial Nerves II - XII - II - Visual field intact OU. III, IV, VI - Extraocular movements intact. V - Facial sensation intact bilaterally. VII - mild right nasolabial fold flattening VIII - Hearing & vestibular intact bilaterally. X - Palate elevates symmetrically. XI - Chin turning & shoulder shrug intact bilaterally. XII - Tongue protrusion intact.  Motor Strength - [name] strength was normal in left upper and lower extremities, right upper extremity 4/5 proximal and 5/5 distal.  Right lower extremity 3/5 proximal and 4/5 distal, and pronator drift was present.  Bulk was normal and fasciculations were absent.   Motor Tone - Muscle tone was assessed at the neck and appendages and was normal.  Reflexes - [name] reflexes were symmetrical in all extremities and she had no pathological reflexes.  Sensory - Light touch, temperature/pinprick were assessed and were symmetrical.    Coordination - The patient had normal movements in the hands with no ataxia or dysmetria.  Tremor was absent.  Gait and Station - deferred.    ASSESSMENT/PLAN Joanna [name] is a 77 y.o. female with history of left MCA territory strokes with unclear residual deficits-may be some mild word finding difficulty and right-sided weakness, CHF, CKD, chronic atrial fibrillation on Coumadin-missed 2 doses, hypertension  presenting with right-sided weakness. . She did not receive IV t-PA due to late presentation and anticoagulation. INR 2.20  Recrudescence of previous stroke due to ongoing bronchitis    Resultant worsening right-sided weakness now resolved  CT head  - No acute intracranial findings.  Chronic left frontal CR infarct  MRI head - No acute intracranial abnormality. Chronic small vessel ischemia and old left frontal infarct.   CTA H&N - Severe LEFT A2 and RIGHT M3 stenosis. Less than 50% stenosis bilateral ICA's.   2D Echo - pending  LDL - 53  HgbA1c -  5.5  INR 2.2  VTE prophylaxis - warfarin  Diet  - Heart healthy with thin liquids.  warfarin daily prior to admission, now on warfarin daily.  Continue Coumadin on discharge, INR goal 2-3.  Patient counseled to be compliant with her antithrombotic medications  Ongoing aggressive stroke risk factor management  Therapy recommendations: Home health PT/OT/speech  Disposition:  Pending  Chronic atrial fibrillation  On Coumadin at home  INR on admission 2.2, today 2.33  Resume Coumadin with INR goal 2-3  History of stroke  12/2010 received TPA for stroke symptoms.  MRI showed old left frontal white matter infarct.  However presentation concerning for DWI negative left MCA infarct.  Carotid Doppler negative.  Ejection fraction 50 to 55%.  MRA showed mild intracranial stenosis.  Discharged with Coumadin  10/2015 admitted for left brain TIA.  MRI/MRA negative.  Carotid Doppler negative.  LDL 59 and A1c 6.6.  INR 2.0.  Discharged with Coumadin  Bronchitis  Patient  complains of 2 weeks of coughing and sputum  Continue to have coughing and secretions  Likely the cause for recrudescence of old stroke  Treatment as per primary team.  Hypertension  Stable . Permissive hypertension (OK if <180/105) but gradually normalize in 5-7 days . Long-term BP goal normotensive  Other Stroke Risk Factors  Advanced age  CHF  History of splenic infarct  Other Active Problems  Severe C3-4 and C4-5 neural foraminal narrowing.   Abnormal anterior mediastinal vascular structure incompletely imaged, this could reflect enlarged RIGHT atrium or aneurysm. Recommend CT angiogram chest.  History of GI bleeding due to ischemic colitis status post partial colectomy and ileostomy    Hospital day # 0  Neurology will sign off. Please call with questions. Pt will [facility] in about 4 weeks. Thanks for the consult.  Marvel PlanJindong Yvonne Petite, MD PhD Stroke  Neurology 10/07/2018 2:43 PM    To contact Stroke Continuity provider, please refer to [email or web address]. After hours, contact General Neurology

## 2018-10-07 NOTE — Care Management Note (Signed)
Case Management Note  Patient Details  Name: Joanna [name] MRN: [id number] Date of Birth: December 29, 1940  Subjective/Objective:                 weakness   Action/Plan:  Sp[facility] to patient at [facility]. She states she has to "think about it" CM will continue to follow   Expected Discharge Date:                  Expected Discharge Plan:  Home w Home Health Services  In-House Referral:     Discharge planning Services  CM Consult  Post Acute Care Choice:    Choice offered to:     DME Arranged:    DME Agency:     HH Arranged:    HH Agency:     Status of Service:  In process, will continue to follow  If discussed at Long Length of Stay Meetings, dates discussed:    Additional Comments:  Lawerance SabalDebbie Jaedin Regina, RN 10/07/2018, 12:22 PM

## 2018-10-07 NOTE — Procedures (Signed)
Patient's heart rate is too high for accurate echo at this time.  Will check heart rate tomorrow.

## 2018-10-08 ENCOUNTER — Ambulatory Visit (HOSPITAL_BASED_OUTPATIENT_CLINIC_OR_DEPARTMENT_OTHER): Payer: Medicare HMO

## 2018-10-08 DIAGNOSIS — I370 Nonrheumatic pulmonary valve stenosis: Secondary | ICD-10-CM

## 2018-10-08 DIAGNOSIS — I361 Nonrheumatic tricuspid (valve) insufficiency: Secondary | ICD-10-CM

## 2018-10-08 DIAGNOSIS — G459 Transient cerebral ischemic attack, unspecified: Secondary | ICD-10-CM | POA: Diagnosis not present

## 2018-10-08 LAB — BASIC METABOLIC PANEL
ANION GAP: 7 (ref 5–15)
BUN: 6 mg/dL — ABNORMAL LOW (ref 8–23)
CALCIUM: 8.4 mg/dL — AB (ref 8.9–10.3)
CHLORIDE: 104 mmol/L (ref 98–111)
CO2: 25 mmol/L (ref 22–32)
CREATININE: 0.82 mg/dL (ref 0.44–1.00)
GFR calc non Af Amer: >60 mL/min (ref >60)
Glucose, Bld: 97 mg/dL (ref 70–99)
Potassium: 3.8 mmol/L (ref 3.5–5.1)
SODIUM: 136 mmol/L (ref 135–145)

## 2018-10-08 LAB — ECHOCARDIOGRAM COMPLETE
Height: 63 in
WEIGHTICAEL: 2578.5 [oz_av]

## 2018-10-08 LAB — PROTIME-INR
INR: 2.46
PROTHROMBIN TIME: 26.4 s — AB (ref 11.4–15.2)

## 2018-10-08 MED ORDER — WARFARIN SODIUM 7.5 MG PO TABS
7.5000 mg | ORAL_TABLET | ORAL | Status: DC
  Administered 2018-10-08 – 2018-10-10 (×2): 7.5 mg via ORAL
  Filled 2018-10-08 (×2): qty 1

## 2018-10-08 MED ORDER — WARFARIN SODIUM 5 MG PO TABS
5.0000 mg | ORAL_TABLET | ORAL | Status: DC
  Administered 2018-10-09: 5 mg via ORAL
  Filled 2018-10-08 (×2): qty 1

## 2018-10-08 MED ORDER — METOPROLOL TARTRATE 50 MG PO TABS
50.0000 mg | ORAL_TABLET | Freq: Two times a day (BID) | ORAL | Status: DC
  Administered 2018-10-08 – 2018-10-09 (×2): 50 mg via ORAL
  Filled 2018-10-08 (×2): qty 1

## 2018-10-08 NOTE — Progress Notes (Signed)
Patient Demographics:    Joanna [name], is a 77 y.o. female, DOB - 1941-05-22, WUJ:[id number]  Admit date - 10/06/2018   Admitting Physician Haydee Monica, MD  Outpatient Primary MD for the patient is Juluis Rainier, MD  LOS - 0   Chief Complaint  Patient presents with  . Weakness        Subjective:    Joanna [name] today has no fevers, no emesis,  No chest pain, with no new concerns, no headaches no visual problems no swallowing difficulties right-sided weakness appears to be back to baseline  Assessment  & Plan :    Principal Problem:   TIA (transient ischemic attack) Active Problems:   Encounter for therapeutic drug monitoring   Chronic atrial fibrillation   Chronic kidney disease   Hypertension   CHF (congestive heart failure) (HCC)  TTE 10/08/18 No grossly evident valvular vegetations.    Torrential tricuspid valve regurgitation with annular dilatation   and lack of coaptation of tricuspid valve leaflets. Massive right   atrial diatilation. Severe RV dilatation with mild-moderately   reduced systolic function. Moderate-severe mitral valve   regurgutation. Severe left atrial dilatation. RVSP 57 mmHg, RA   pressure estimate at least 20 mmHg. Moderate pulmonary valve   regurgitation.  Brief Summary 77 y.o. female with medical history significant of CKD, CVA with residual right-sided hemiparesis, CHF, HTN admitted on 10/06/2018 due to concerns about worsening right-sided hemiparesis of recent URI symptoms family due to weakness seems to be more right sided and not acting normal for at least a month. .  No noticabale facial weakness, no slurring, no drooling  Plan:- 1)Recrudescence of previous stroke due to ongoing bronchitis  --- in retrospect no evidence of new acute stroke, right-sided hemiparesis is back to baseline, no further new neuro deficits, CT head, MRI head, CTA head and  neck all without acute findings except for Abnormal anterior mediastinal vascular structure incompletely imaged, this could reflect enlarged RIGHT atrium or aneurysm.  Neurologist recommended CTA chest for further evaluation this is pending at this time recommend CT angiogram chest.  CTA chest without acute findings specifically no aneurysm, echocardiogram without evidence of aneurysm, continue Coumadin therapy, A1c is 5.5, LDL is 53  2)HFpEF--- echo with severe TR, moderate to severe MR, EF is 50 to 55 %,   3) right-sided hemiparesis and generalized weakness--- PT Drenda Freeze and OT eval appreciated, recommend SNF rehab  4) chronic atrial fibrillation--- INR is therapeutic continue Coumadin, rate control improving with Cardizem and metoprolol, will increase metoprolol to 50 mg twice daily for even better rate control  5) generalized weakness/debility/high risk for falls--- due to #1 above, PT eval appreciated recommended SNF rehab   Disposition/Need for in-Hospital Stay- patient unable to be discharged at this time due to awaiting insurance approval for transfer to SNF for rehab  Code Status : full  Disposition Plan  : TBD  Consults  : Neurology  DVT Prophylaxis  : Coumadin Lab Results  Component Value Date   PLT 157 10/06/2018    Inpatient Medications  Scheduled Meds: . diltiazem  30 mg Oral QID  . feeding supplement (ENSURE ENLIVE)  237 mL Oral BID BM  . guaiFENesin  1,200 mg Oral BID  . metoprolol tartrate  25 mg Oral BID  . [START ON 10/09/2018] warfarin  5 mg Oral Q T,Th,S,Su-1800  . warfarin  7.5 mg Oral Q M,W,F-1800  . Warfarin - Pharmacist Dosing Inpatient   Does not apply q1800   Continuous Infusions: PRN Meds:.acetaminophen **OR** acetaminophen (TYLENOL) oral liquid 160 mg/5 mL **OR** acetaminophen   Anti-infectives (From admission, onward)   None        Objective:   Vitals:   10/08/18 0352 10/08/18 0742 10/08/18 1155 10/08/18 1617  BP: 134/90 (!) 142/102 137/77  (!) 155/96  Pulse: 85 (!) 104 85 93  Resp: 18 18 18 18   Temp: 98 F (36.7 C) 98.1 F (36.7 C) 98.2 F (36.8 C) 98.1 F (36.7 C)  TempSrc: Oral Oral Oral Oral  SpO2: 95% 93% 97% 93%  Weight:      Height:        Wt Readings from Last 3 Encounters:  10/06/18 73.1 kg  07/18/18 72.6 kg  01/10/18 79.1 kg     Intake/Output Summary (Last 24 hours) at 10/08/2018 1831 Last data filed at 10/08/2018 1300 Gross per 24 hour  Intake 560 ml  Output -  Net 560 ml     Physical Exam Patient is examined daily including today on 10/08/18 , exams remain the same as of yesterday except that has changed   Gen:- Awake Alert,  In no apparent distress  HEENT:- [place].AT, No sclera icterus Neck-Supple Neck,No JVD,.  Lungs-  CTAB , fair symmetrical air movement CV- S1, S2 normal, irrregular , HR 104 Abd-  +ve B.Sounds, Abd Soft, No tenderness,    Extremity/Skin:- No  edema, pedal pulses present  Psych-affect is appropriate, oriented x3 Neuro-no new focal deficits, no tremors, residual right hemiparesis appears to be at baseline, patient very weak unable to get up or ambulate independently GU---RN noted discoloration and bumps in the vaginal area, no acute findings,    Data Review:   Micro Results Recent Results (from the past 240 hour(s))  Urine culture     Status: Abnormal   Collection Time: 10/06/18  5:59 PM  Result Value Ref Range Status   Specimen Description URINE, RANDOM  Final   Special Requests NONE  Final   Culture (A)  Final    <10,000 COLONIES/mL INSIGNIFICANT GROWTH Performed at [facility], [address] [place], [place] [address]    Report Status 10/07/2018 FINAL  Final    Radiology Reports Ct Angio Head W Or Wo Contrast  Result Date: 10/06/2018 CLINICAL DATA:  Generalized weakness for 4 days. Assess TIA. History of stroke, hypertension and atrial fibrillation. EXAM: CT ANGIOGRAPHY HEAD AND NECK TECHNIQUE: Multidetector CT imaging of the head and neck was  performed using the standard protocol during bolus administration of intravenous contrast. Multiplanar CT image reconstructions and MIPs were obtained to evaluate the vascular anatomy. Carotid stenosis measurements (when applicable) are obtained utilizing NASCET criteria, using the distal internal carotid diameter as the denominator. CONTRAST:  100mL ISOVUE-370 IOPAMIDOL (ISOVUE-370) INJECTION 76% COMPARISON:  CT HEAD October 06, 2018 and MRI/MRA head November 27, 2017. FINDINGS: CTA NECK FINDINGS: AORTIC ARCH: Thoracic aorta is normal in course and caliber. Average RIGHT subclavian artery coursing posterior to trachea and esophagus. Moderate to severe calcific atherosclerosis arch vessel origins. Arch vessel origins are patent. RIGHT CAROTID SYSTEM: Common carotid artery is patent, moderate calcific atherosclerosis. Calcific atherosclerosis resulting in less than 50% stenosis by NASCET criteria. Patent internal carotid artery. LEFT CAROTID SYSTEM: Common carotid artery is patent, moderate calcific  atherosclerosis. Calcific atherosclerosis resulting in less than 50% stenosis by NASCET criteria. Patent internal carotid artery. VERTEBRAL ARTERIES:Left vertebral artery is dominant. Moderate stenosis LEFT vertebral artery origin. Patent vertebral arteries with mild extrinsic compression due to degenerative change. SKELETON: No acute osseous process though bone windows have not been submitted. Severe bilateral C3-4, RIGHT C4-5 neural foraminal narrowing. OTHER NECK: Soft tissues of the neck are nonacute though, not tailored for evaluation. UPPER CHEST: Included lung apices are clear. Cardiomegaly. Abnormal enhancement anterior to the superior vena cava and ascending aortic, appearing vascular. CTA HEAD FINDINGS: ANTERIOR CIRCULATION: Patent cervical internal carotid arteries, petrous, cavernous and supra clinoid internal carotid arteries. Calcific atherosclerosis resulting in moderate stenosis RIGHT carotid siphon.  Patent anterior communicating artery. Patent anterior and middle cerebral arteries, moderate luminal irregularity compatible with atherosclerosis. Severe stenosis LEFT A2 segment with transient suspected occlusion. Severe stenosis RIGHT M3 segment. No large vessel occlusion, contrast extravasation or aneurysm. POSTERIOR CIRCULATION: Patent vertebral arteries, vertebrobasilar junction and basilar artery, as well as main branch vessels. Patent posterior cerebral arteries, moderate luminal irregularity compatible with atherosclerosis. No large vessel occlusion, significant stenosis, contrast extravasation or aneurysm. VENOUS SINUSES: Major dural venous sinuses are patent though not tailored for evaluation on this angiographic examination. ANATOMIC VARIANTS: None. DELAYED PHASE: No abnormal intracranial enhancement. MIP images reviewed. IMPRESSION: CTA NECK: 1. Less than 50% stenosis bilateral ICA's. 2. Patent vertebral arteries, moderate stenosis LEFT V1 origin. 3. Abnormal anterior mediastinal vascular structure incompletely imaged, this could reflect enlarged RIGHT atrium or aneurysm. Recommend CT angiogram chest. 4. Severe C3-4 and C4-5 neural foraminal narrowing. CTA HEAD: 1. No emergent large vessel occlusion. 2. Severe LEFT A2 and RIGHT M3 stenosis. 3. Moderate intracranial atherosclerosis. Electronically Signed   By: Awilda Metro M.D.   On: 10/06/2018 21:54   Dg Chest 2 View  Result Date: 10/06/2018 CLINICAL DATA:  Weakness starting 4 days prior to imaging. EXAM: CHEST - 2 VIEW COMPARISON:  10/26/2014 FINDINGS: Prominent enlargement of the cardiopericardial silhouette, similar to prior. The right heart is particularly enlarged. Atherosclerotic calcification of the aortic arch. Bandlike density in the right mid lung likely from atelectasis or scarring. The lungs appear otherwise clear. Mild thoracic spondylosis. No blunting of the costophrenic angles. Reduced right acromial humeral space implies  potential rotator cuff tear. IMPRESSION: 1. Prominent but chronic enlargement of the cardiopericardial silhouette, without edema. 2. Right mid lung atelectasis or scarring. 3.  Aortic Atherosclerosis (ICD10-I70.0). 4. Thoracic spondylosis. Electronically Signed   By: Gaylyn Rong M.D.   On: 10/06/2018 16:39   Ct Head Wo Contrast  Result Date: 10/06/2018 CLINICAL DATA:  Weakness over the last 4 days EXAM: CT HEAD WITHOUT CONTRAST TECHNIQUE: Contiguous axial images were obtained from the base of the skull through the vertex without intravenous contrast. COMPARISON:  11/27/2016 MRI and CT scans FINDINGS: Brain: Periventricular white matter and corona radiata hypodensities favor chronic ischemic microvascular white matter disease. Remote left frontal lacunar infarct. Faint calcification of the lentiform nuclei, chronic. Otherwise, the brainstem, cerebellum, cerebral peduncles, thalami, basal ganglia, basilar cisterns, and ventricular system appear within normal limits. No intracranial hemorrhage, mass lesion, or acute CVA. Vascular: Atherosclerotic calcification of the cavernous segment of the right internal carotid artery. Skull: Hyperostosis frontalis interna. Sinuses/Orbits: Considerable chronic maxillary and ethmoid sinusitis. Acute on chronic right sphenoid sinusitis. Other: No supplemental non-categorized findings. IMPRESSION: 1. Acute on chronic right sphenoid sinusitis. Chronic paranasal sinusitis. 2. No acute intracranial findings. 3. Periventricular white matter and corona radiata hypodensities favor chronic ischemic microvascular  white matter disease. Old left frontal corona radiata lacunar infarct. Electronically Signed   By: Gaylyn Rong M.D.   On: 10/06/2018 16:43   Ct Angio Neck W Or Wo Contrast  Result Date: 10/06/2018 CLINICAL DATA:  Generalized weakness for 4 days. Assess TIA. History of stroke, hypertension and atrial fibrillation. EXAM: CT ANGIOGRAPHY HEAD AND NECK TECHNIQUE:  Multidetector CT imaging of the head and neck was performed using the standard protocol during bolus administration of intravenous contrast. Multiplanar CT image reconstructions and MIPs were obtained to evaluate the vascular anatomy. Carotid stenosis measurements (when applicable) are obtained utilizing NASCET criteria, using the distal internal carotid diameter as the denominator. CONTRAST:  ISOVUE-370 IOPAMIDOL (ISOVUE-370) INJECTION 76% COMPARISON:  CT HEAD October 06, 2018 and MRI/MRA head November 27, 2017. FINDINGS: CTA NECK FINDINGS: AORTIC ARCH: Thoracic aorta is normal in course and caliber. Average RIGHT subclavian artery coursing posterior to trachea and esophagus. Moderate to severe calcific atherosclerosis arch vessel origins. Arch vessel origins are patent. RIGHT CAROTID SYSTEM: Common carotid artery is patent, moderate calcific atherosclerosis. Calcific atherosclerosis resulting in less than 50% stenosis by NASCET criteria. Patent internal carotid artery. LEFT CAROTID SYSTEM: Common carotid artery is patent, moderate calcific atherosclerosis. Calcific atherosclerosis resulting in less than 50% stenosis by NASCET criteria. Patent internal carotid artery. VERTEBRAL ARTERIES:Left vertebral artery is dominant. Moderate stenosis LEFT vertebral artery origin. Patent vertebral arteries with mild extrinsic compression due to degenerative change. SKELETON: No acute osseous process though bone windows have not been submitted. Severe bilateral C3-4, RIGHT C4-5 neural foraminal narrowing. OTHER NECK: Soft tissues of the neck are nonacute though, not tailored for evaluation. UPPER CHEST: Included lung apices are clear. Cardiomegaly. Abnormal enhancement anterior to the superior vena cava and ascending aortic, appearing vascular. CTA HEAD FINDINGS: ANTERIOR CIRCULATION: Patent cervical internal carotid arteries, petrous, cavernous and supra clinoid internal carotid arteries. Calcific atherosclerosis  resulting in moderate stenosis RIGHT carotid siphon. Patent anterior communicating artery. Patent anterior and middle cerebral arteries, moderate luminal irregularity compatible with atherosclerosis. Severe stenosis LEFT A2 segment with transient suspected occlusion. Severe stenosis RIGHT M3 segment. No large vessel occlusion, contrast extravasation or aneurysm. POSTERIOR CIRCULATION: Patent vertebral arteries, vertebrobasilar junction and basilar artery, as well as main branch vessels. Patent posterior cerebral arteries, moderate luminal irregularity compatible with atherosclerosis. No large vessel occlusion, significant stenosis, contrast extravasation or aneurysm. VENOUS SINUSES: Major dural venous sinuses are patent though not tailored for evaluation on this angiographic examination. ANATOMIC VARIANTS: None. DELAYED PHASE: No abnormal intracranial enhancement. MIP images reviewed. IMPRESSION: CTA NECK: 1. Less than 50% stenosis bilateral ICA's. 2. Patent vertebral arteries, moderate stenosis LEFT V1 origin. 3. Abnormal anterior mediastinal vascular structure incompletely imaged, this could reflect enlarged RIGHT atrium or aneurysm. Recommend CT angiogram chest. 4. Severe C3-4 and C4-5 neural foraminal narrowing. CTA HEAD: 1. No emergent large vessel occlusion. 2. Severe LEFT A2 and RIGHT M3 stenosis. 3. Moderate intracranial atherosclerosis. Electronically Signed   By: Awilda Metro M.D.   On: 10/06/2018 21:54   Ct Angio Chest Pe W Or Wo Contrast  Result Date: 10/07/2018 CLINICAL DATA:  Worsening dyspnea. Abnormal vascular structure in superior mediastinum on recent neck CTA. EXAM: CT ANGIOGRAPHY CHEST WITH CONTRAST TECHNIQUE: Multidetector CT imaging of the chest was performed using the standard protocol during bolus administration of intravenous contrast. Multiplanar CT image reconstructions and MIPs were obtained to evaluate the vascular anatomy. CONTRAST:  75mL ISOVUE-370 IOPAMIDOL (ISOVUE-370)  INJECTION 76% COMPARISON:  Neck CTA on 10/06/2018; no prior chest CT  FINDINGS: Cardiovascular: Marked cardiomegaly is seen with severe right atrial enlargement. Dilated right atrial appendage corresponds to the mediastinal abnormality seen on recent neck CTA. Aberrant origin of right subclavian artery incidentally noted. Satisfactory opacification of pulmonary arteries noted, and no pulmonary emboli identified. No evidence of thoracic aorticaneurysm. Aortic and coronary arterial atherosclerosis. Mediastinum/Nodes: No masses or pathologically enlarged lymph nodes identified. Lungs/Pleura: No pulmonary mass, infiltrate, or effusion. Upper abdomen: Distension and contrast opacification of IVC and hepatic veins is consistent with right heart insufficiency. Musculoskeletal: No suspicious bone lesions identified. Review of the MIP images confirms the above findings. IMPRESSION: No evidence of pulmonary embolism or other acute findings. Marked cardiomegaly with severe right atrial enlargement, consistent with tricuspid insufficiency. Distention of IVC and hepatic veins is also consistent with right heart insufficiency. Incidentally noted aberrant origin of right subclavian artery. Aortic and coronary arterial atherosclerosis. Electronically Signed   By: Myles Rosenthal M.D.   On: 10/07/2018 21:24   Mr Brain Wo Contrast  Result Date: 10/07/2018 CLINICAL DATA:  Ataxia. EXAM: MRI HEAD WITHOUT CONTRAST TECHNIQUE: Multiplanar, multiecho pulse sequences of the brain and surrounding structures were obtained without intravenous contrast. COMPARISON:  CTA head neck 10/06/2018 FINDINGS: BRAIN: There is no acute infarct, acute hemorrhage, hydrocephalus or extra-axial collection. The midline structures are normal. No midline shift or other mass effect. Old, small left frontal infarct. Multifocal white matter hyperintensity, most commonly due to chronic ischemic microangiopathy. The cerebral and cerebellar volume are age-appropriate.  Susceptibility-sensitive sequences show no chronic microhemorrhage or superficial siderosis. VASCULAR: Major intracranial arterial and venous sinus flow voids are normal. SKULL AND UPPER CERVICAL SPINE: Calvarial bone marrow signal is normal. There is no skull base mass. Visualized upper cervical spine and soft tissues are normal. SINUSES/ORBITS: Moderate-to-severe bilateral maxillary and ethmoid sinus mucosal thickening. Mild right sphenoid mucosal thickening. No mastoid or middle ear effusion. The orbits are normal. IMPRESSION: 1. No acute intracranial abnormality. 2. Chronic small vessel ischemia and old left frontal infarct. 3. Moderate paranasal sinus disease. Electronically Signed   By: Deatra Robinson M.D.   On: 10/07/2018 05:25     CBC Recent Labs  Lab 10/06/18 1431  WBC 4.8  HGB 13.8  HCT 43.5  PLT 157  MCV 102.6*  MCH 32.5  MCHC 31.7  RDW 14.4  LYMPHSABS 0.8  MONOABS 1.0  EOSABS 0.0  BASOSABS 0.0    Chemistries  Recent Labs  Lab 10/06/18 1431 10/08/18 0613  NA 136 136  K 3.8 3.8  CL 102 104  CO2 24 25  GLUCOSE 88 97  BUN 12 6*  CREATININE 0.93 0.82  CALCIUM 8.9 8.4*   ------------------------------------------------------------------------------------------------------------------ Recent Labs    10/07/18 0546  CHOL 87  HDL 22*  LDLCALC 53  TRIG 60  CHOLHDL 4.0    Lab Results  Component Value Date   HGBA1C 5.5 10/07/2018   Coagulation profile Recent Labs  Lab 10/06/18 1457 10/07/18 0741 10/08/18 0613  INR 2.20 2.33 2.46    No results for input(s): DDIMER in the last 72 hours.  Cardiac Enzymes Recent Labs  Lab 10/06/18 1431  TROPONINI <0.03   ------------------------------------------------------------------------------------------------------------------    Component Value Date/Time   BNP 789.0 (H) 09/01/2015 1243    Shon Hale M.D on 10/08/2018 at 6:31 PM  Pager---[phone] Go to [email or web address] - password [email or web address] for contact  info  [facility] - Office  [phone]

## 2018-10-08 NOTE — Discharge Instructions (Signed)

## 2018-10-08 NOTE — [place] FL2 (Signed)
[place] MEDICAID FL2 LEVEL OF CARE SCREENING TOOL     IDENTIFICATION  Patient Name: Joanna [name] Birthdate: 09/28/1941 Sex: female Admission Date (Current Location): 10/06/2018  [facility] [place]   Facility and Address:  [facility] [place], [address], [place], [place] [address]      Provider Number: [id number]  Attending Physician Name and Address:  Shon HaleEmokpae, Courage, MD  Relative Name and Phone Number:       Current Level of Care: Hospital Recommended Level of Care: Skilled Nursing Facility Prior Approval Number:    Date Approved/Denied:   PASRR Number: [id number]  Discharge Plan: SNF    Current Diagnoses: Patient Active Problem List   Diagnosis Date Noted  . TIA (transient ischemic attack) 10/06/2018  . Weakness   . Chronic kidney disease   . Hypertension   . CHF (congestive heart failure) (HCC)   . Stroke-like symptoms 11/27/2016  . Stroke (cerebrum) (HCC) 10/27/2015  . CVA (cerebral infarction) 10/26/2015  . Acute ischemic left MCA stroke (HCC) 10/26/2015  . Chronic atrial fibrillation 10/26/2015  . Encounter for therapeutic drug monitoring 01/03/2014  . Left ventricular dysfunction   . Atrial fibrillation (HCC) 02/04/2011  . Stroke (HCC) 12/15/2010    Orientation RESPIRATION BLADDER Height & Weight     Self, Situation, Place  Normal Incontinent Weight: 73.1 kg Height:  5\' 3"  (160 cm)  BEHAVIORAL SYMPTOMS/MOOD NEUROLOGICAL BOWEL NUTRITION STATUS      Continent Diet(heart healthy and thins)  AMBULATORY STATUS COMMUNICATION OF NEEDS Skin   Extensive Assist Verbally Normal                       Personal Care Assistance Level of Assistance  Bathing, Feeding, Dressing Bathing Assistance: Limited assistance Feeding assistance: Independent Dressing Assistance: Limited assistance     Functional Limitations Info  Sight, Hearing, Speech Sight Info: Adequate Hearing Info: Adequate Speech Info:  Adequate    SPECIAL CARE FACTORS FREQUENCY  PT (By licensed PT), OT (By licensed OT), Speech therapy     PT Frequency: 5x/wk OT Frequency: 5x/wk     Speech Therapy Frequency: 5x/wk      Contractures Contractures Info: Not present    Additional Factors Info  Code Status, Allergies Code Status Info: Full Allergies Info: NKA           Current Medications (10/08/2018):  This is the current hospital active medication list Current Facility-Administered Medications  Medication Dose Route Frequency Provider Last Rate Last Dose  . acetaminophen (TYLENOL) tablet 650 mg  650 mg Oral Q4H PRN Haydee Monicaavid, Rachal A, MD   650 mg at 10/07/18 2137   Or  . acetaminophen (TYLENOL) solution 650 mg  650 mg Per Tube Q4H PRN Haydee Monicaavid, Rachal A, MD       Or  . acetaminophen (TYLENOL) suppository 650 mg  650 mg Rectal Q4H PRN Haydee Monicaavid, Rachal A, MD      . diltiazem (CARDIZEM) tablet 30 mg  30 mg Oral QID Shon HaleEmokpae, Courage, MD   30 mg at 10/08/18 1339  . feeding supplement (ENSURE ENLIVE) (ENSURE ENLIVE) liquid 237 mL  237 mL Oral BID BM [facility], McAdmits, MD   237 mL at 10/08/18 1548  . guaiFENesin (MUCINEX) 12 hr tablet 1,200 mg  1,200 mg Oral BID Tarry Kosavid, Rachal A, MD   1,200 mg at 10/08/18 1007  . metoprolol tartrate (LOPRESSOR) tablet 25 mg  25 mg Oral BID Shon HaleEmokpae, Courage, MD   25  mg at 10/08/18 1007  . [START ON 10/09/2018] warfarin (COUMADIN) tablet 5 mg  5 mg Oral Q T,Th,S,Su-1800 [place], [place], [place]      . warfarin (COUMADIN) tablet 7.5 mg  7.5 mg Oral Q M,W,F-1800 Norva Pavlov, RPH      . Warfarin - Pharmacist Dosing Inpatient   Does not apply q1800 Sharlett Iles, [facility]         Discharge Medications: Please see discharge summary for a list of discharge medications.  Relevant Imaging Results:  Relevant Lab Results:   Additional Information SS#: [id number]  Kermit Balo, RN

## 2018-10-08 NOTE — Progress Notes (Signed)
PT Cancellation Note  Patient Details Name: Pricilla Riffleatricia A Edison MRN: [id number] DOB: Feb 19, 1941   Cancelled Treatment:    Reason Eval/Treat Not Completed: Patient at procedure or test/unavailable. Will follow-up for PT treatment as schedule permits.  Ina HomesJaclyn Selassie Spatafore, PT, DPT Acute Rehabilitation Services  Pager [phone] Office [phone]  Malachy ChamberJaclyn L Manoj Enriquez 10/08/2018, 10:34 AM

## 2018-10-08 NOTE — Progress Notes (Signed)
Nutrition Brief Note  Patient identified on the Malnutrition Screening Tool (MST) Report  Wt Readings from Last 15 Encounters:  10/06/18 73.1 kg  07/18/18 72.6 kg  01/10/18 79.1 kg  11/27/16 82.2 kg  11/15/16 85.3 kg  05/06/16 83 kg  10/26/15 87.6 kg  09/17/15 89.4 kg  09/01/15 95.8 kg  01/21/15 92.6 kg  07/25/14 91.6 kg  12/16/13 92.1 kg  04/12/13 94.7 kg  02/17/12 96.6 kg  07/29/11 97.1 kg    Body mass index is 28.55 kg/m. Patient meets criteria for overweight based on current BMI. Pt with no significant weight loss.  Current diet order is heart healthy, patient is consuming approximately 90-100% of meals at this time. Pt with good appetite currently and PTA with usual consumption of at least 2 meals a day with multiple snacks in between. Labs and medications reviewed. Pt currently has Ensure ordered and has been refusing them. RD to discontinue Ensure order.   No nutrition interventions warranted at this time. If nutrition issues arise, please consult RD.   Roslyn SmilingStephanie Lakesha Levinson, MS, RD, LDN Pager # [phone] After hours/ weekend pager # [phone]

## 2018-10-08 NOTE — Progress Notes (Signed)
ANTICOAGULATION CONSULT NOTE - Follow Up Consult  Pharmacy Consult for Coumadin Indication: afib and h/o CVA  No Known Allergies  Patient Measurements: Height: 5\' 3"  (160 cm) Weight: 161 lb 2.5 oz (73.1 kg) IBW/kg (Calculated) : 52.4  Vital Signs: Temp: 98.2 F (36.8 C) (11/25 1155) Temp Source: Oral (11/25 1155) BP: 137/77 (11/25 1155) Pulse Rate: 85 (11/25 1155)  Labs: Recent Labs    10/06/18 1431 10/06/18 1457 10/07/18 0741 10/08/18 0613  HGB 13.8  --   --   --   HCT 43.5  --   --   --   PLT 157  --   --   --   LABPROT  --  24.2* 25.3* 26.4*  INR  --  2.20 2.33 2.46  CREATININE 0.93  --   --  0.82  TROPONINI <0.03  --   --   --     Estimated Creatinine Clearance: 54.2 mL/min (by C-G formula based on SCr of 0.82 mg/dL).  Assessment:  Anticoag: PTA warfarin for Afib and h/o CVA. INR 2.46 today, CBC WNL.  - PTA dose: 7.5mg  MWF, 5mg  TTSS, admit INR 2.2   Goal of Therapy:  INR 2-3 Monitor platelets by anticoagulation protocol: Yes   Plan:  Continue home dose of Coumadin 7.5mg  MWF, 5mg  TTSS Daily INR  Athanasios Heldman S. Merilynn Finlandobertson, PharmD, BCPS Clinical Staff Pharmacist Misty Stanleyobertson, Solly Derasmo Stillinger 10/08/2018,1:23 PM

## 2018-10-08 NOTE — Progress Notes (Signed)
Echocardiogram 2D Echocardiogram has been performed.  Joanna PartridgeBrooke S Jamariyah [name] 10/08/2018, 10:51 AM

## 2018-10-08 NOTE — Progress Notes (Signed)
Physical Therapy Treatment Patient Details Name: Joanna [name] MRN: [id number] DOB: 01-05-1941 Today's Date: 10/08/2018    History of Present Illness Pt is a 77 y.o. female admitted 10/06/18 with R-side weakness. CT and MRI unremarkable for acute intracranial abnormality; old L frontal infarct. Concern for TIA. PMH includes a-fib (on Coumadin), CHF, CKD, stroke, aphasia.   PT Comments    Pt limited by fatigue and c/o lightheadedness this session (see BP values below). Pt required minA for bed mobility; attempted to stand 2x, but unable to achieve fully upright despite maxA. Pt with generalized weakness, decreased activity tolerance and poor insight into deficits. Discussed recommendation for SNF-level therapies to maximize functional mobility and independence prior to return home; pt unsure about this since she wants to be home for upcoming holiday. Will follow acutely.    Follow Up Recommendations  SNF;Supervision for mobility/OOB     Equipment Recommendations  (TBD)    Recommendations for Other Services       Precautions / Restrictions Precautions Precautions: Fall Restrictions Weight Bearing Restrictions: No    Mobility  Bed Mobility Overal bed mobility: Needs Assistance Bed Mobility: Supine to Sit     Supine to sit: Min assist;HOB elevated     General bed mobility comments: Significant increased time and effort to come to sit. Pt requesting bed flat, then falling backwards, requiring UE support to assis trunk elevation. Sat with feet hanging off despite cues to scoot to edge until feet on ground, laying down 2x due to fatigue, requiring UE support to come to sitting both times  Transfers Overall transfer level: Needs assistance Equipment used: 1 person hand held assist Transfers: Sit to/from Stand Sit to Stand: Max assist         General transfer comment: MaxA to stand, attempting 2x but unable to achieve fully upright standing; pt reports fatigue and  lightheadedness requesting return to supine  Ambulation/Gait                 Stairs             Wheelchair Mobility    Modified Rankin (Stroke Patients Only)       Balance Overall balance assessment: Needs assistance Sitting-balance support: Feet supported;No upper extremity supported Sitting balance-Leahy Scale: Fair Sitting balance - Comments: Sitting balance fair with feet support; without feet support, pt laying backwards multiple times due to fatigue and poort balance     Standing balance-Leahy Scale: Zero                              Cognition Arousal/Alertness: Awake/alert Behavior During Therapy: Flat affect Overall Cognitive Status: No family/caregiver present to determine baseline cognitive functioning Area of Impairment: Attention;Memory;Safety/judgement;Problem solving;Following commands;Awareness                   Current Attention Level: Sustained Memory: Decreased short-term memory Following Commands: Follows one step commands with increased time Safety/Judgement: Decreased awareness of deficits Awareness: Emergent Problem Solving: Slow processing;Requires verbal cues;Decreased initiation        Exercises      General Comments General comments (skin integrity, edema, etc.): Seated BP 141/88, sitting 2 min BP 130/77      Pertinent Vitals/Pain Pain Assessment: No/denies pain    Home Living                      Prior Function  PT Goals (current goals can now be found in the care plan section) Acute Rehab PT Goals Patient Stated Goal: to go home for Thanksgiving PT Goal Formulation: With patient Time For Goal Achievement: 10/21/18 Potential to Achieve Goals: Fair Progress towards PT goals: Not progressing toward goals - comment(Limited by fatigue and lightheadeness)    Frequency    Min 4X/week      PT Plan Discharge plan needs to be updated;Frequency needs to be updated     Co-evaluation              AM-PAC PT "6 Clicks" Mobility   Outcome Measure  Help needed turning from your back to your side while in a flat bed without using bedrails?: A Little Help needed moving from lying on your back to sitting on the side of a flat bed without using bedrails?: A Little Help needed moving to and from a bed to a chair (including a wheelchair)?: A Lot Help needed standing up from a chair using your arms (e.g., wheelchair or bedside chair)?: A Lot Help needed to walk in hospital room?: A Lot Help needed climbing 3-5 steps with a railing? : Total 6 Click Score: 13    End of Session Equipment Utilized During Treatment: Gait belt Activity Tolerance: Patient limited by fatigue Patient left: in bed;with call bell/phone within reach;with bed alarm set Nurse Communication: Mobility status PT Visit Diagnosis: Muscle weakness (generalized) (M62.81);Difficulty in walking, not elsewhere classified (R26.2);Hemiplegia and hemiparesis;Unsteadiness on feet (R26.81) Hemiplegia - Right/Left: Right Hemiplegia - dominant/non-dominant: Dominant Hemiplegia - caused by: Unspecified     Time: 4098-1191 PT Time Calculation (min) (ACUTE ONLY): 21 min  Charges:  $Therapeutic Activity: 8-22 mins                    Ina Homes, PT, DPT Acute Rehabilitation Services  Pager [phone] Office [phone]  Malachy Chamber 10/08/2018, 1:55 PM

## 2018-10-09 DIAGNOSIS — G459 Transient cerebral ischemic attack, unspecified: Secondary | ICD-10-CM | POA: Diagnosis not present

## 2018-10-09 LAB — PROTIME-INR
INR: 2.28
PROTHROMBIN TIME: 24.8 s — AB (ref 11.4–15.2)

## 2018-10-09 MED ORDER — METOPROLOL SUCCINATE ER 25 MG PO TB24
50.0000 mg | ORAL_TABLET | Freq: Every day | ORAL | Status: DC
  Administered 2018-10-09 – 2018-10-12 (×4): 50 mg via ORAL
  Filled 2018-10-09 (×4): qty 2

## 2018-10-09 MED ORDER — DILTIAZEM HCL ER COATED BEADS 120 MG PO CP24
120.0000 mg | ORAL_CAPSULE | Freq: Every day | ORAL | Status: DC
  Administered 2018-10-09 – 2018-10-12 (×4): 120 mg via ORAL
  Filled 2018-10-09 (×4): qty 1

## 2018-10-09 NOTE — Progress Notes (Signed)
Patient Demographics:    Joanna [name], is a 77 y.o. female, DOB - 1941-05-02, ZOX:[id number]  Admit date - 10/06/2018   Admitting Physician Haydee Monica, MD  Outpatient Primary MD for the patient is Juluis Rainier, MD  LOS - 0   Chief Complaint  Patient presents with  . Weakness        Subjective:    Joanna [name] today has no fevers, no emesis,  No chest pain, with no new concerns, no headaches no visual problems no swallowing difficulties ... Patient remains very weak,  Assessment  & Plan :    Principal Problem:   TIA (transient ischemic attack) Active Problems:   Encounter for therapeutic drug monitoring   Chronic atrial fibrillation   Chronic kidney disease   Hypertension   CHF (congestive heart failure) (HCC)  TTE 10/08/18 No grossly evident valvular vegetations.    Torrential tricuspid valve regurgitation with annular dilatation   and lack of coaptation of tricuspid valve leaflets. Massive right   atrial diatilation. Severe RV dilatation with mild-moderately   reduced systolic function. Moderate-severe mitral valve   regurgutation. Severe left atrial dilatation. RVSP 57 mmHg, RA   pressure estimate at least 20 mmHg. Moderate pulmonary valve   regurgitation.  Brief Summary 77 y.o. female with medical history significant of CKD, CVA with residual right-sided hemiparesis, CHF, HTN admitted on 10/06/2018 due to concerns about worsening right-sided hemiparesis of recent URI symptoms family due to weakness seems to be more right sided and not acting normal for at least a month. .  No NEW noticabale facial weakness, no slurring, no drooling  Plan:- 1)Recrudescence of previous stroke due to ongoing bronchitis  --- in retrospect no evidence of new acute stroke, right-sided hemiparesis is back to baseline, no further new neuro deficits, CT head, MRI head, CTA head and neck all without  acute findings except for Abnormal anterior mediastinal vascular structure incompletely imaged, this could reflect enlarged RIGHT atrium or aneurysm.  Neurologist recommended CTA chest for further evaluation this is pending at this time recommend CT angiogram chest.  CTA chest without acute findings specifically no aneurysm, echocardiogram without evidence of aneurysm, continue Coumadin therapy, A1c is 5.5, LDL is 53  2)HFpEF---  echo with severe TR, moderate to severe MR, EF is 50 to 55 %, no CHF exacerbation at this time, continue Toprol-XL 50 mg daily  3)Right-sided hemiparesis and generalized weakness--- PT Drenda Freeze and OT eval appreciated, recommend SNF rehab  4) chronic atrial fibrillation--- INR is therapeutic, continue Coumadin, rate control improving with Cardizem and metoprolol, switch to Toprol-XL 50 mg daily and Cardizem CD 120 mg daily  5)Generalized weakness/debility/high risk for falls--- due to #1 above, PT eval appreciated recommended SNF rehab   Disposition/Need for in-Hospital Stay- patient unable to be discharged at this time due to awaiting insurance approval for transfer to SNF  rehab  Code Status : full  Disposition Plan  :  SNF  Consults  : Neurology  DVT Prophylaxis  : Coumadin Lab Results  Component Value Date   PLT 157 10/06/2018    Inpatient Medications  Scheduled Meds: . diltiazem  30 mg Oral QID  . guaiFENesin  1,200 mg Oral BID  . metoprolol tartrate  50 mg Oral BID  .  warfarin  5 mg Oral Q T,Th,S,Su-1800  . warfarin  7.5 mg Oral Q M,W,F-1800  . Warfarin - Pharmacist Dosing Inpatient   Does not apply q1800   Continuous Infusions: PRN Meds:.acetaminophen **OR** acetaminophen (TYLENOL) oral liquid 160 mg/5 mL **OR** acetaminophen   Anti-infectives (From admission, onward)   None       Objective:   Vitals:   10/09/18 0400 10/09/18 0756 10/09/18 1205 10/09/18 1553  BP: 140/84 (!) 145/85 116/66 134/77  Pulse: 80 (!) 102 67 79  Resp: 18 18 18 18    Temp: 97.8 F (36.6 C) 98.3 F (36.8 C) 98.2 F (36.8 C) 98.6 F (37 C)  TempSrc: Oral Oral Oral Oral  SpO2: 95% 94% 93% 95%  Weight:      Height:        Wt Readings from Last 3 Encounters:  10/06/18 73.1 kg  07/18/18 72.6 kg  01/10/18 79.1 kg     Intake/Output Summary (Last 24 hours) at 10/09/2018 1904 Last data filed at 10/09/2018 1700 Gross per 24 hour  Intake 980 ml  Output -  Net 980 ml     Physical Exam Patient is examined daily including today on 10/09/18 , exams remain the same as of yesterday except that has changed   Gen:- Awake Alert,  In no apparent distress  HEENT:- [place].AT, No sclera icterus Neck-Supple Neck,No JVD,.  Lungs-  CTAB , fair symmetrical air movement CV- S1, S2 normal, irrregular , HR 80 Abd-  +ve B.Sounds, Abd Soft, No tenderness,    Extremity/Skin:- No  edema, pedal pulses present  Psych-affect is appropriate, oriented x3 Neuro-patient with generalized weakness, no new focal deficits, no tremors, residual right hemiparesis appears to be at baseline, patient very weak unable to get up or ambulate independently GU---RN noted discoloration and bumps in the vaginal area, no acute findings,    Data Review:   Micro Results Recent Results (from the past 240 hour(s))  Urine culture     Status: Abnormal   Collection Time: 10/06/18  5:59 PM  Result Value Ref Range Status   Specimen Description URINE, RANDOM  Final   Special Requests NONE  Final   Culture (A)  Final    <10,000 COLONIES/mL INSIGNIFICANT GROWTH Performed at [facility], [address] [place], [place] [address]    Report Status 10/07/2018 FINAL  Final    Radiology Reports Ct Angio Head W Or Wo Contrast  Result Date: 10/06/2018 CLINICAL DATA:  Generalized weakness for 4 days. Assess TIA. History of stroke, hypertension and atrial fibrillation. EXAM: CT ANGIOGRAPHY HEAD AND NECK TECHNIQUE: Multidetector CT imaging of the head and neck was performed using the standard  protocol during bolus administration of intravenous contrast. Multiplanar CT image reconstructions and MIPs were obtained to evaluate the vascular anatomy. Carotid stenosis measurements (when applicable) are obtained utilizing NASCET criteria, using the distal internal carotid diameter as the denominator. CONTRAST:  ISOVUE-370 IOPAMIDOL (ISOVUE-370) INJECTION 76% COMPARISON:  CT HEAD October 06, 2018 and MRI/MRA head November 27, 2017. FINDINGS: CTA NECK FINDINGS: AORTIC ARCH: Thoracic aorta is normal in course and caliber. Average RIGHT subclavian artery coursing posterior to trachea and esophagus. Moderate to severe calcific atherosclerosis arch vessel origins. Arch vessel origins are patent. RIGHT CAROTID SYSTEM: Common carotid artery is patent, moderate calcific atherosclerosis. Calcific atherosclerosis resulting in less than 50% stenosis by NASCET criteria. Patent internal carotid artery. LEFT CAROTID SYSTEM: Common carotid artery is patent, moderate calcific atherosclerosis. Calcific atherosclerosis resulting in less than  50% stenosis by NASCET criteria. Patent internal carotid artery. VERTEBRAL ARTERIES:Left vertebral artery is dominant. Moderate stenosis LEFT vertebral artery origin. Patent vertebral arteries with mild extrinsic compression due to degenerative change. SKELETON: No acute osseous process though bone windows have not been submitted. Severe bilateral C3-4, RIGHT C4-5 neural foraminal narrowing. OTHER NECK: Soft tissues of the neck are nonacute though, not tailored for evaluation. UPPER CHEST: Included lung apices are clear. Cardiomegaly. Abnormal enhancement anterior to the superior vena cava and ascending aortic, appearing vascular. CTA HEAD FINDINGS: ANTERIOR CIRCULATION: Patent cervical internal carotid arteries, petrous, cavernous and supra clinoid internal carotid arteries. Calcific atherosclerosis resulting in moderate stenosis RIGHT carotid siphon. Patent anterior communicating  artery. Patent anterior and middle cerebral arteries, moderate luminal irregularity compatible with atherosclerosis. Severe stenosis LEFT A2 segment with transient suspected occlusion. Severe stenosis RIGHT M3 segment. No large vessel occlusion, contrast extravasation or aneurysm. POSTERIOR CIRCULATION: Patent vertebral arteries, vertebrobasilar junction and basilar artery, as well as main branch vessels. Patent posterior cerebral arteries, moderate luminal irregularity compatible with atherosclerosis. No large vessel occlusion, significant stenosis, contrast extravasation or aneurysm. VENOUS SINUSES: Major dural venous sinuses are patent though not tailored for evaluation on this angiographic examination. ANATOMIC VARIANTS: None. DELAYED PHASE: No abnormal intracranial enhancement. MIP images reviewed. IMPRESSION: CTA NECK: 1. Less than 50% stenosis bilateral ICA's. 2. Patent vertebral arteries, moderate stenosis LEFT V1 origin. 3. Abnormal anterior mediastinal vascular structure incompletely imaged, this could reflect enlarged RIGHT atrium or aneurysm. Recommend CT angiogram chest. 4. Severe C3-4 and C4-5 neural foraminal narrowing. CTA HEAD: 1. No emergent large vessel occlusion. 2. Severe LEFT A2 and RIGHT M3 stenosis. 3. Moderate intracranial atherosclerosis. Electronically Signed   By: Awilda Metro M.D.   On: 10/06/2018 21:54   Dg Chest 2 View  Result Date: 10/06/2018 CLINICAL DATA:  Weakness starting 4 days prior to imaging. EXAM: CHEST - 2 VIEW COMPARISON:  10/26/2014 FINDINGS: Prominent enlargement of the cardiopericardial silhouette, similar to prior. The right heart is particularly enlarged. Atherosclerotic calcification of the aortic arch. Bandlike density in the right mid lung likely from atelectasis or scarring. The lungs appear otherwise clear. Mild thoracic spondylosis. No blunting of the costophrenic angles. Reduced right acromial humeral space implies potential rotator cuff tear.  IMPRESSION: 1. Prominent but chronic enlargement of the cardiopericardial silhouette, without edema. 2. Right mid lung atelectasis or scarring. 3.  Aortic Atherosclerosis (ICD10-I70.0). 4. Thoracic spondylosis. Electronically Signed   By: Gaylyn Rong M.D.   On: 10/06/2018 16:39   Ct Head Wo Contrast  Result Date: 10/06/2018 CLINICAL DATA:  Weakness over the last 4 days EXAM: CT HEAD WITHOUT CONTRAST TECHNIQUE: Contiguous axial images were obtained from the base of the skull through the vertex without intravenous contrast. COMPARISON:  11/27/2016 MRI and CT scans FINDINGS: Brain: Periventricular white matter and corona radiata hypodensities favor chronic ischemic microvascular white matter disease. Remote left frontal lacunar infarct. Faint calcification of the lentiform nuclei, chronic. Otherwise, the brainstem, cerebellum, cerebral peduncles, thalami, basal ganglia, basilar cisterns, and ventricular system appear within normal limits. No intracranial hemorrhage, mass lesion, or acute CVA. Vascular: Atherosclerotic calcification of the cavernous segment of the right internal carotid artery. Skull: Hyperostosis frontalis interna. Sinuses/Orbits: Considerable chronic maxillary and ethmoid sinusitis. Acute on chronic right sphenoid sinusitis. Other: No supplemental non-categorized findings. IMPRESSION: 1. Acute on chronic right sphenoid sinusitis. Chronic paranasal sinusitis. 2. No acute intracranial findings. 3. Periventricular white matter and corona radiata hypodensities favor chronic ischemic microvascular white matter disease. Old left frontal corona  radiata lacunar infarct. Electronically Signed   By: Gaylyn RongWalter  Liebkemann M.D.   On: 10/06/2018 16:43   Ct Angio Neck W Or Wo Contrast  Result Date: 10/06/2018 CLINICAL DATA:  Generalized weakness for 4 days. Assess TIA. History of stroke, hypertension and atrial fibrillation. EXAM: CT ANGIOGRAPHY HEAD AND NECK TECHNIQUE: Multidetector CT imaging of the  head and neck was performed using the standard protocol during bolus administration of intravenous contrast. Multiplanar CT image reconstructions and MIPs were obtained to evaluate the vascular anatomy. Carotid stenosis measurements (when applicable) are obtained utilizing NASCET criteria, using the distal internal carotid diameter as the denominator. CONTRAST:  100mL ISOVUE-370 IOPAMIDOL (ISOVUE-370) INJECTION 76% COMPARISON:  CT HEAD October 06, 2018 and MRI/MRA head November 27, 2017. FINDINGS: CTA NECK FINDINGS: AORTIC ARCH: Thoracic aorta is normal in course and caliber. Average RIGHT subclavian artery coursing posterior to trachea and esophagus. Moderate to severe calcific atherosclerosis arch vessel origins. Arch vessel origins are patent. RIGHT CAROTID SYSTEM: Common carotid artery is patent, moderate calcific atherosclerosis. Calcific atherosclerosis resulting in less than 50% stenosis by NASCET criteria. Patent internal carotid artery. LEFT CAROTID SYSTEM: Common carotid artery is patent, moderate calcific atherosclerosis. Calcific atherosclerosis resulting in less than 50% stenosis by NASCET criteria. Patent internal carotid artery. VERTEBRAL ARTERIES:Left vertebral artery is dominant. Moderate stenosis LEFT vertebral artery origin. Patent vertebral arteries with mild extrinsic compression due to degenerative change. SKELETON: No acute osseous process though bone windows have not been submitted. Severe bilateral C3-4, RIGHT C4-5 neural foraminal narrowing. OTHER NECK: Soft tissues of the neck are nonacute though, not tailored for evaluation. UPPER CHEST: Included lung apices are clear. Cardiomegaly. Abnormal enhancement anterior to the superior vena cava and ascending aortic, appearing vascular. CTA HEAD FINDINGS: ANTERIOR CIRCULATION: Patent cervical internal carotid arteries, petrous, cavernous and supra clinoid internal carotid arteries. Calcific atherosclerosis resulting in moderate stenosis RIGHT  carotid siphon. Patent anterior communicating artery. Patent anterior and middle cerebral arteries, moderate luminal irregularity compatible with atherosclerosis. Severe stenosis LEFT A2 segment with transient suspected occlusion. Severe stenosis RIGHT M3 segment. No large vessel occlusion, contrast extravasation or aneurysm. POSTERIOR CIRCULATION: Patent vertebral arteries, vertebrobasilar junction and basilar artery, as well as main branch vessels. Patent posterior cerebral arteries, moderate luminal irregularity compatible with atherosclerosis. No large vessel occlusion, significant stenosis, contrast extravasation or aneurysm. VENOUS SINUSES: Major dural venous sinuses are patent though not tailored for evaluation on this angiographic examination. ANATOMIC VARIANTS: None. DELAYED PHASE: No abnormal intracranial enhancement. MIP images reviewed. IMPRESSION: CTA NECK: 1. Less than 50% stenosis bilateral ICA's. 2. Patent vertebral arteries, moderate stenosis LEFT V1 origin. 3. Abnormal anterior mediastinal vascular structure incompletely imaged, this could reflect enlarged RIGHT atrium or aneurysm. Recommend CT angiogram chest. 4. Severe C3-4 and C4-5 neural foraminal narrowing. CTA HEAD: 1. No emergent large vessel occlusion. 2. Severe LEFT A2 and RIGHT M3 stenosis. 3. Moderate intracranial atherosclerosis. Electronically Signed   By: Awilda Metroourtnay  Bloomer M.D.   On: 10/06/2018 21:54   Ct Angio Chest Pe W Or Wo Contrast  Result Date: 10/07/2018 CLINICAL DATA:  Worsening dyspnea. Abnormal vascular structure in superior mediastinum on recent neck CTA. EXAM: CT ANGIOGRAPHY CHEST WITH CONTRAST TECHNIQUE: Multidetector CT imaging of the chest was performed using the standard protocol during bolus administration of intravenous contrast. Multiplanar CT image reconstructions and MIPs were obtained to evaluate the vascular anatomy. CONTRAST:  75mL ISOVUE-370 IOPAMIDOL (ISOVUE-370) INJECTION 76% COMPARISON:  Neck CTA on  10/06/2018; no prior chest CT FINDINGS: Cardiovascular: Marked cardiomegaly is seen with  severe right atrial enlargement. Dilated right atrial appendage corresponds to the mediastinal abnormality seen on recent neck CTA. Aberrant origin of right subclavian artery incidentally noted. Satisfactory opacification of pulmonary arteries noted, and no pulmonary emboli identified. No evidence of thoracic aorticaneurysm. Aortic and coronary arterial atherosclerosis. Mediastinum/Nodes: No masses or pathologically enlarged lymph nodes identified. Lungs/Pleura: No pulmonary mass, infiltrate, or effusion. Upper abdomen: Distension and contrast opacification of IVC and hepatic veins is consistent with right heart insufficiency. Musculoskeletal: No suspicious bone lesions identified. Review of the MIP images confirms the above findings. IMPRESSION: No evidence of pulmonary embolism or other acute findings. Marked cardiomegaly with severe right atrial enlargement, consistent with tricuspid insufficiency. Distention of IVC and hepatic veins is also consistent with right heart insufficiency. Incidentally noted aberrant origin of right subclavian artery. Aortic and coronary arterial atherosclerosis. Electronically Signed   By: Myles Rosenthal M.D.   On: 10/07/2018 21:24   Mr Brain Wo Contrast  Result Date: 10/07/2018 CLINICAL DATA:  Ataxia. EXAM: MRI HEAD WITHOUT CONTRAST TECHNIQUE: Multiplanar, multiecho pulse sequences of the brain and surrounding structures were obtained without intravenous contrast. COMPARISON:  CTA head neck 10/06/2018 FINDINGS: BRAIN: There is no acute infarct, acute hemorrhage, hydrocephalus or extra-axial collection. The midline structures are normal. No midline shift or other mass effect. Old, small left frontal infarct. Multifocal white matter hyperintensity, most commonly due to chronic ischemic microangiopathy. The cerebral and cerebellar volume are age-appropriate. Susceptibility-sensitive sequences show  no chronic microhemorrhage or superficial siderosis. VASCULAR: Major intracranial arterial and venous sinus flow voids are normal. SKULL AND UPPER CERVICAL SPINE: Calvarial bone marrow signal is normal. There is no skull base mass. Visualized upper cervical spine and soft tissues are normal. SINUSES/ORBITS: Moderate-to-severe bilateral maxillary and ethmoid sinus mucosal thickening. Mild right sphenoid mucosal thickening. No mastoid or middle ear effusion. The orbits are normal. IMPRESSION: 1. No acute intracranial abnormality. 2. Chronic small vessel ischemia and old left frontal infarct. 3. Moderate paranasal sinus disease. Electronically Signed   By: Deatra Robinson M.D.   On: 10/07/2018 05:25     CBC Recent Labs  Lab 10/06/18 1431  WBC 4.8  HGB 13.8  HCT 43.5  PLT 157  MCV 102.6*  MCH 32.5  MCHC 31.7  RDW 14.4  LYMPHSABS 0.8  MONOABS 1.0  EOSABS 0.0  BASOSABS 0.0    Chemistries  Recent Labs  Lab 10/06/18 1431 10/08/18 0613  NA 136 136  K 3.8 3.8  CL 102 104  CO2 24 25  GLUCOSE 88 97  BUN 12 6*  CREATININE 0.93 0.82  CALCIUM 8.9 8.4*   ------------------------------------------------------------------------------------------------------------------ Recent Labs    10/07/18 0546  CHOL 87  HDL 22*  LDLCALC 53  TRIG 60  CHOLHDL 4.0    Lab Results  Component Value Date   HGBA1C 5.5 10/07/2018   Coagulation profile Recent Labs  Lab 10/06/18 1457 10/07/18 0741 10/08/18 0613 10/09/18 0730  INR 2.20 2.33 2.46 2.28    No results for input(s): DDIMER in the last 72 hours.  Cardiac Enzymes Recent Labs  Lab 10/06/18 1431  TROPONINI <0.03   ------------------------------------------------------------------------------------------------------------------    Component Value Date/Time   BNP 789.0 (H) 09/01/2015 1243    Shon Hale M.D on 10/09/2018 at 7:04 PM  Pager---[phone] Go to [email or web address] - password [email or web address] for contact info  [facility] - Office  [phone]

## 2018-10-09 NOTE — Progress Notes (Signed)
Occupational Therapy Treatment Patient Details Name: Joanna [name] MRN: [id number] DOB: 01/16/1941 Today's Date: 10/09/2018    History of present illness Pt is a 77 y.o. female admitted 10/06/18 with R-side weakness. CT and MRI unremarkable for acute intracranial abnormality; old L frontal infarct. Concern for TIA. PMH includes a-fib (on Coumadin), CHF, CKD, stroke, aphasia.   OT comments  Pt making progress. Requires min A with mobility and ADL @ RW level. Appropriate to DC home if she has 24/7 S initially. Encourage staff to ambulate pt using RW and gait belt. Will continue to follow acutely.  Follow Up Recommendations  Home health OT;Supervision/Assistance - 24 hour    Equipment Recommendations  3 in 1 bedside commode;Other (comment)(RW)    Recommendations for Other Services PT consult;Speech consult    Precautions / Restrictions Precautions Precautions: Fall Restrictions Weight Bearing Restrictions: No       Mobility Bed Mobility Overal bed mobility: Needs Assistance Bed Mobility: Supine to Sit Rolling: Supervision            Transfers Overall transfer level: Needs assistance Equipment used: Rolling walker (2 wheeled) Transfers: Sit to/from Stand Sit to Stand: Min guard         General transfer comment: initially unsteady and R bias    Balance Overall balance assessment: Needs assistance   Sitting balance-Leahy Scale: Good       Standing balance-Leahy Scale: Poor                             ADL either performed or assessed with clinical judgement   ADL Overall ADL's : Needs assistance/impaired Eating/Feeding: Modified independent   Grooming: Set up;Supervision/safety;Standing   Upper Body Bathing: Set up;Supervision/ safety;Standing   Lower Body Bathing: Minimal assistance;Sit to/from stand   Upper Body Dressing : Set up;Supervision/safety;Sitting   Lower Body Dressing: Minimal assistance;Sit to/from stand   Toilet  Transfer: Minimal assistance;Ambulation   Toileting- Clothing Manipulation and Hygiene: Minimal assistance       Functional mobility during ADLs: Minimal assistance;Rolling walker;Cueing for safety       Vision       Perception     Praxis      Cognition Arousal/Alertness: Awake/alert Behavior During Therapy: WFL for tasks assessed/performed Overall Cognitive Status: No family/caregiver present to determine baseline cognitive functioning Area of Impairment: Attention;Safety/judgement;Awareness;Problem solving                   Current Attention Level: Sustained   Following Commands: Follows one step commands consistently Safety/Judgement: Decreased awareness of safety;Decreased awareness of deficits Awareness: Emergent Problem Solving: Slow processing General Comments: unaware of being incontinenet; incontinenet at baseline        Exercises     Shoulder Instructions       General Comments      Pertinent Vitals/ Pain       Pain Assessment: No/denies pain  Home Living                                          Prior Functioning/Environment              Frequency  Min 3X/week        Progress Toward Goals  OT Goals(current goals can now be found in the care plan section)  Progress towards OT goals: Progressing toward goals  Acute Rehab  OT Goals Patient Stated Goal: to go home for Thanksgiving OT Goal Formulation: With patient Time For Goal Achievement: 10/21/18 Potential to Achieve Goals: Good  Plan Discharge plan remains appropriate;Other (comment)(if she has 24/7 S)    Co-evaluation                 AM-PAC OT "6 Clicks" Daily Activity     Outcome Measure   Help from another person eating meals?: None Help from another person taking care of personal grooming?: None Help from another person toileting, which includes using toliet, bedpan, or urinal?: A Little Help from another person bathing (including washing,  rinsing, drying)?: A Little Help from another person to put on and taking off regular upper body clothing?: A Little Help from another person to put on and taking off regular lower body clothing?: A Little 6 Click Score: 20    End of Session Equipment Utilized During Treatment: Gait belt;Rolling walker  OT Visit Diagnosis: Other abnormalities of gait and mobility (R26.89);Other symptoms and signs involving cognitive function;Muscle weakness (generalized) (M62.81)   Activity Tolerance Patient tolerated treatment well   Patient Left in chair;with call bell/phone within reach;with chair alarm set   Nurse Communication Mobility status        Time: 1610-9604 OT Time Calculation (min): 38 min  Charges: OT General Charges $OT Visit: 1 Visit OT Treatments $Self Care/Home Management : 38-52 mins  Luisa Dago, OT/L   Acute OT Clinical Specialist Acute Rehabilitation Services Pager [phone] Office [phone]    [facility] 10/09/2018, 9:58 AM

## 2018-10-09 NOTE — Progress Notes (Signed)
  Speech Language Pathology Treatment: Cognitive-Linquistic  Patient Details Name: Pricilla Riffleatricia A Patella MRN: [id number] DOB: 1941-02-19 Today's Date: 10/09/2018 Time: 1050-1100 SLP Time Calculation (min) (ACUTE ONLY): 10 min  Assessment / Plan / Recommendation Clinical Impression  Pt was seen for skilled ST targeting cognitive goals.  Pt was seated up in recliner upon therapist's arrival, awake, alert, and agreeable to participating in therapy.  Pt was oriented to place and situation but needed max cues to reorient to date.  Pt was able to state that her birthday was [birth year] but then repeatedly stated that today was the 2nd of November.  Pt gave conflicting information as to reason for hospitalization as well, stating that her left side was weak and had been weak from previous CVA instead of right side.  Pt sustained her attention to functional conversations regarding upcoming holidays for ~5-7 minutes with increased time to accommodate for slowed processing.  Upon therapist's departure, pt was able to independently verbalize that she needed to have assistance when getting up from the recliner and returned demonstration for use of call bell with supervision question cues.  Pt was left in recliner with call bell within reach and chair alarm set.  Continue per current plan of care.    HPI HPI: Patient is a 77 y/o female who presents with right sided weakness. Head CT and MRI-unremarkable. Concern for TIA. PMH includes A-fib on Coumadin, CHF, CKD, stroke, HTN, aphasia. Seen by SLP in 11/28/16 and scored 25/30 on MOCA, no aphasia or dysarthria noted 11/28/16 (pt with documented aphasia s/p CVA in 2012).       SLP Plan  Continue with current plan of care       Recommendations                   Follow up Recommendations: Home health SLP;24 hour supervision/assistance SLP Visit Diagnosis: Cognitive communication deficit (W09.811(R41.841) Plan: Continue with current plan of care       GO                 Dosia Yodice, Melanee Spryicole L 10/09/2018, 11:05 AM

## 2018-10-09 NOTE — Progress Notes (Signed)
ANTICOAGULATION CONSULT NOTE - Follow Up Consult  Pharmacy Consult for Coumadin Indication: afib and h/o CVA  No Known Allergies  Patient Measurements: Height: 5\' 3"  (160 cm) Weight: 161 lb 2.5 oz (73.1 kg) IBW/kg (Calculated) : 52.4  Vital Signs: Temp: 98.3 F (36.8 C) (11/26 0756) Temp Source: Oral (11/26 0756) BP: 145/85 (11/26 0756) Pulse Rate: 102 (11/26 0756)  Labs: Recent Labs    10/06/18 1431  10/07/18 0741 10/08/18 0613 10/09/18 0730  HGB 13.8  --   --   --   --   HCT 43.5  --   --   --   --   PLT 157  --   --   --   --   LABPROT  --    < > 25.3* 26.4* 24.8*  INR  --    < > 2.33 2.46 2.28  CREATININE 0.93  --   --  0.82  --   TROPONINI <0.03  --   --   --   --    < > = values in this interval not displayed.    Estimated Creatinine Clearance: 54.2 mL/min (by C-G formula based on SCr of 0.82 mg/dL).  Assessment:  Anticoag: PTA warfarin for Afib and h/o CVA. INR 2.28 today, CBC WNL.  - PTA dose: 7.5mg  MWF, 5mg  TTSS, admit INR 2.2   Goal of Therapy:  INR 2-3 Monitor platelets by anticoagulation protocol: Yes   Plan:  Continue home dose of Coumadin 7.5mg  MWF, 5mg  TTSS Daily INR  Jams Trickett S. Merilynn Finlandobertson, PharmD, BCPS Clinical Staff Pharmacist Misty Stanleyobertson, Meghna Hagmann Stillinger 10/09/2018,10:24 AM

## 2018-10-09 NOTE — Care Management Note (Signed)
Case Management Note  Patient Details  Name: Joanna [name] MRN: [id number] Date of Birth: 10/22/1941  Subjective/Objective:                    Action/Plan: Recommendations have changed to SNF for rehab. CM met with the patient yesterday and she was agreeable to SNF and asked to be faxed out in [facility] area.  She asked that her daughter make the selection for rehab as she can not remember where she has been in the past.  CM called daughter, Joanna [name] and provided her with the bed offers for her mother. She selected [facility]. CSW updated.   Expected Discharge Date:                  Expected Discharge Plan:  Skilled Nursing Facility  In-House Referral:  Clinical Social Work  Discharge planning Services  CM Consult  Post Acute Care Choice:    Choice offered to:     DME Arranged:    DME Agency:     HH Arranged:    [place] Agency:     Status of Service:  In process, will continue to follow  If discussed at Long Length of Stay Meetings, dates discussed:    Additional Comments:  Pollie Friar, RN 10/09/2018, 4:21 PM

## 2018-10-09 NOTE — Progress Notes (Signed)
Physical Therapy Treatment Patient Details Name: Joanna [name] MRN: [id number] DOB: Nov 08, 1941 Today's Date: 10/09/2018    History of Present Illness Pt is a 77 y.o. female admitted 10/06/18 with R-side weakness. CT and MRI unremarkable for acute intracranial abnormality; old L frontal infarct. Concern for TIA. PMH includes a-fib (on Coumadin), CHF, CKD, stroke, aphasia.    PT Comments    Making good improvements, but not ready to be at home alone.  Emphasis on bed mobility, transfer technique, gait and stair training, all with very little assist, but consistently guarded and mildly unsteady.  Follow Up Recommendations  SNF;Supervision for mobility/OOB     Equipment Recommendations  Other (comment)(TBA next venue)    Recommendations for Other Services       Precautions / Restrictions Precautions Precautions: Fall    Mobility  Bed Mobility Overal bed mobility: Needs Assistance Bed Mobility: Supine to Sit     Supine to sit: Min guard        Transfers Overall transfer level: Needs assistance   Transfers: Sit to/from Stand Sit to Stand: Min assist         General transfer comment: minimal stability once up  Ambulation/Gait Ambulation/Gait assistance: Min assist;+2 physical assistance Gait Distance (Feet): 200 Feet Assistive device: 2 person hand held assist Gait Pattern/deviations: Step-through pattern Gait velocity: decreased Gait velocity interpretation: <1.31 ft/sec, indicative of household ambulator General Gait Details: slower guarded steps with minimal unsteadiness, but needing at least minimal HHA on 1 person   Stairs Stairs: Yes Stairs assistance: Min guard;+2 safety/equipment Stair Management: Two rails;Step to pattern;Forwards Number of Stairs: 4 General stair comments: tentative with rails   Wheelchair Mobility    Modified Rankin (Stroke Patients Only) Modified Rankin (Stroke Patients Only) Modified Rankin: Moderately severe  disability     Balance Overall balance assessment: Needs assistance   Sitting balance-Leahy Scale: Good       Standing balance-Leahy Scale: Poor                              Cognition Arousal/Alertness: Awake/alert Behavior During Therapy: WFL for tasks assessed/performed Overall Cognitive Status: No family/caregiver present to determine baseline cognitive functioning                                 General Comments: NT formally      Exercises Other Exercises Other Exercises: warm up LE ROM exercise prior to mobility    General Comments        Pertinent Vitals/Pain Pain Assessment: No/denies pain    Home Living                      Prior Function            PT Goals (current goals can now be found in the care plan section) Acute Rehab PT Goals Patient Stated Goal: to go home for Thanksgiving PT Goal Formulation: With patient Time For Goal Achievement: 10/21/18 Potential to Achieve Goals: Fair    Frequency    Min 3X/week      PT Plan Current plan remains appropriate    Co-evaluation              AM-PAC PT "6 Clicks" Mobility   Outcome Measure  Help needed turning from your back to your side while in a flat bed without using bedrails?: A Little Help needed  moving from lying on your back to sitting on the side of a flat bed without using bedrails?: A Little Help needed moving to and from a bed to a chair (including a wheelchair)?: A Little Help needed standing up from a chair using your arms (e.g., wheelchair or bedside chair)?: A Little Help needed to walk in hospital room?: A Little Help needed climbing 3-5 steps with a railing? : A Little 6 Click Score: 18    End of Session   Activity Tolerance: Patient tolerated treatment well Patient left: in bed;with call bell/phone within reach;with bed alarm set Nurse Communication: Mobility status PT Visit Diagnosis: Other abnormalities of gait and mobility  (R26.89);Difficulty in walking, not elsewhere classified (R26.2);Other symptoms and signs involving the nervous system (R29.898)     Time: [id number] PT Time Calculation (min) (ACUTE ONLY): 25 min  Charges:  $Gait Training: 8-22 mins $Therapeutic Activity: 8-22 mins                     10/09/2018  [place] BingKen Hau Sanor, PT Acute Rehabilitation Services [phone]  (pager) [phone]  (office)   Joanna [name] 10/09/2018, 5:51 PM

## 2018-10-10 DIAGNOSIS — I482 Chronic atrial fibrillation, unspecified: Secondary | ICD-10-CM | POA: Diagnosis not present

## 2018-10-10 DIAGNOSIS — I5032 Chronic diastolic (congestive) heart failure: Secondary | ICD-10-CM

## 2018-10-10 DIAGNOSIS — I10 Essential (primary) hypertension: Secondary | ICD-10-CM | POA: Diagnosis not present

## 2018-10-10 DIAGNOSIS — G459 Transient cerebral ischemic attack, unspecified: Secondary | ICD-10-CM | POA: Diagnosis not present

## 2018-10-10 LAB — CBC
HCT: 40.7 % (ref 36.0–46.0)
Hemoglobin: 13 g/dL (ref 12.0–15.0)
MCH: 32.1 pg (ref 26.0–34.0)
MCHC: 31.9 g/dL (ref 30.0–36.0)
MCV: 100.5 fL — ABNORMAL HIGH (ref 80.0–100.0)
NRBC: 0 % (ref 0.0–0.2)
PLATELETS: 166 10*3/uL (ref 150–400)
RBC: 4.05 MIL/uL (ref 3.87–5.11)
RDW: 14.5 % (ref 11.5–15.5)
WBC: 4.9 10*3/uL (ref 4.0–10.5)

## 2018-10-10 LAB — BASIC METABOLIC PANEL
ANION GAP: 6 (ref 5–15)
BUN: 10 mg/dL (ref 8–23)
CO2: 27 mmol/L (ref 22–32)
Calcium: 8.6 mg/dL — ABNORMAL LOW (ref 8.9–10.3)
Chloride: 104 mmol/L (ref 98–111)
Creatinine, Ser: 0.88 mg/dL (ref 0.44–1.00)
Glucose, Bld: 85 mg/dL (ref 70–99)
POTASSIUM: 3.7 mmol/L (ref 3.5–5.1)
Sodium: 137 mmol/L (ref 135–145)

## 2018-10-10 LAB — PROTIME-INR
INR: 2.35
Prothrombin Time: 25.4 seconds — ABNORMAL HIGH (ref 11.4–15.2)

## 2018-10-10 NOTE — Progress Notes (Signed)
Physical Therapy Treatment Patient Details Name: Joanna [name] MRN: [id number] DOB: [birth year] Today's Date: 10/10/2018    History of Present Illness Pt is a 77 y.o. female admitted 10/06/18 with R-side weakness. CT and MRI unremarkable for acute intracranial abnormality; old L frontal infarct. Concern for TIA. PMH includes a-fib (on Coumadin), CHF, CKD, stroke, aphasia.    PT Comments    Steady progress.  Continues to be unsteady and will likely need RW for a while.  Emphasis on gait stability and standing balance while doing hygiene.   Follow Up Recommendations  SNF     Equipment Recommendations  Other (comment)(TBA next venue)    Recommendations for Other Services       Precautions / Restrictions Precautions Precautions: Fall    Mobility  Bed Mobility Overal bed mobility: Needs Assistance Bed Mobility: Supine to Sit     Supine to sit: Min assist     General bed mobility comments: stiff getting up for the first time of the day  Transfers Overall transfer level: Needs assistance   Transfers: Sit to/from Stand Sit to Stand: Min assist         General transfer comment: stability assist while pt doing the moving  Ambulation/Gait Ambulation/Gait assistance: Min assist Gait Distance (Feet): 200 Feet Assistive device: 1 person hand held assist Gait Pattern/deviations: Step-through pattern Gait velocity: decreased Gait velocity interpretation: <1.8 ft/sec, indicate of risk for recurrent falls General Gait Details: slower with ability to double speed with cuing.  Still unsteady, worsening mildly with scanning   Stairs             Wheelchair Mobility    Modified Rankin (Stroke Patients Only) Modified Rankin (Stroke Patients Only) Modified Rankin: Moderately severe disability     Balance Overall balance assessment: Needs assistance   Sitting balance-Leahy Scale: Good       Standing balance-Leahy Scale: Poor Standing balance comment: a long  session at the sink for hygiene after toileting.  pt needed UE support of one hand to be able to do her own wiping                            Cognition Arousal/Alertness: Awake/alert Behavior During Therapy: WFL for tasks assessed/performed Overall Cognitive Status: No family/caregiver present to determine baseline cognitive functioning(functional within context of treatment)                                 General Comments: NT formally      Exercises      General Comments        Pertinent Vitals/Pain Pain Assessment: No/denies pain    Home Living                      Prior Function            PT Goals (current goals can now be found in the care plan section) Acute Rehab PT Goals Patient Stated Goal: to go home for Thanksgiving PT Goal Formulation: With patient Time For Goal Achievement: 10/21/18 Potential to Achieve Goals: Fair Progress towards PT goals: Progressing toward goals    Frequency    Min 3X/week      PT Plan Current plan remains appropriate    Co-evaluation              AM-PAC PT "6 Clicks" Mobility   Outcome Measure  Help needed turning from your back to your side while in a flat bed without using bedrails?: A Little Help needed moving from lying on your back to sitting on the side of a flat bed without using bedrails?: A Little Help needed moving to and from a bed to a chair (including a wheelchair)?: A Little Help needed standing up from a chair using your arms (e.g., wheelchair or bedside chair)?: A Little Help needed to walk in hospital room?: A Little Help needed climbing 3-5 steps with a railing? : A Little 6 Click Score: 18    End of Session   Activity Tolerance: Patient tolerated treatment well Patient left: with call bell/phone within reach;in chair;with chair alarm set Nurse Communication: Mobility status PT Visit Diagnosis: Other abnormalities of gait and mobility (R26.89);Difficulty in  walking, not elsewhere classified (R26.2);Other symptoms and signs involving the nervous system (R29.898)     Time: 1010-1050 PT Time Calculation (min) (ACUTE ONLY): 40 min  Charges:  $Gait Training: 8-22 mins $Therapeutic Activity: 23-37 mins                     10/10/2018  Joanna BingKen Dailee [name], PT Acute Rehabilitation Services [phone]  (pager) [phone]  (office)   Joanna [name] 10/10/2018, 11:04 AM

## 2018-10-10 NOTE — Progress Notes (Signed)
PROGRESS NOTE        PATIENT DETAILS Name: Joanna [name] Age: 77 y.o. Sex: female Date of Birth: 08/19/1941 Admit Date: 10/06/2018 Admitting Physician Haydee Monica, MD ZOX:[id number], Lanora Manis, MD  Brief Narrative: Patient is a 77 y.o. female with prior history of CVA with chronic right-sided hemiparesis, hypertension, chronic atrial fibrillation, chronic diastolic heart failure-presented to the ED with concerns about worsening right-sided hemiparesis and a URI symptoms, upon further evaluation thought to have recrudescence of previous stroke due to ongoing bronchitis.  See below for further details  Subjective: Denies any chest pain or shortness of breath.  No complaints this morning.  Assessment/Plan: Recrudescence of prior stroke due to ongoing bronchitis: Underwent extensive work-up including MRI brain, CTA head and neck without any acute CVA.  She is back to her usual baseline.  Continue Coumadin.  No further recommendations from stroke team.  Chronic diastolic heart failure: Compensated-continue beta-blocker.  Chronic atrial fibrillation: INR therapeutic-rate controlled with Toprol and Cardizem.  Continue Coumadin being managed by pharmacy while inpatient.  Generalized weakness/debility/high risk of falls: Due to acute illness-suspect has some amount of debility at baseline-evaluated by PT-recommendations are for SNF.  DVT Prophylaxis: Full dose anticoagulation with Coumadin  Code Status: Full code   Family Communication: None at bedside  Disposition Plan: Remain inpatient- SNF on discharge-awaiting bed placement-social work following.  Antimicrobial agents: Anti-infectives (From admission, onward)   None      Procedures: None  CONSULTS:  neurology  Time spent: 25 minutes-Greater than 50% of this time was spent in counseling, explanation of diagnosis, planning of further management, and coordination of  care.  MEDICATIONS: Scheduled Meds: . diltiazem  120 mg Oral Daily  . guaiFENesin  1,200 mg Oral BID  . metoprolol succinate  50 mg Oral Daily  . warfarin  5 mg Oral Q T,Th,S,Su-1800  . warfarin  7.5 mg Oral Q M,W,F-1800  . Warfarin - Pharmacist Dosing Inpatient   Does not apply q1800   Continuous Infusions: PRN Meds:.acetaminophen **OR** acetaminophen (TYLENOL) oral liquid 160 mg/5 mL **OR** acetaminophen   PHYSICAL EXAM: Vital signs: Vitals:   10/09/18 2356 10/10/18 0403 10/10/18 0756 10/10/18 1201  BP: 136/76 113/60 127/73 114/73  Pulse: 71 77 79 60  Resp: 18 16 18 18   Temp: 98.3 F (36.8 C) 98.3 F (36.8 C) 98.2 F (36.8 C) 98.2 F (36.8 C)  TempSrc: Oral Oral Oral Oral  SpO2: 98% 97% 94% 95%  Weight:      Height:       Filed Weights   10/06/18 1348 10/06/18 2147  Weight: 66.7 kg 73.1 kg   Body mass index is 28.55 kg/m.   General appearance :Awake, alert, not in any distress. Eyes: pupils equally reactive to light and accomodation,no scleral icterus. HEENT: Atraumatic and Normocephalic Neck: supple Resp:Good air entry bilaterally, no added sounds  CVS: S1 S2 regular, no murmurs.  GI: Bowel sounds present, Non tender and not distended with no gaurding, rigidity or rebound.No organomegaly Extremities: B/L Lower Ext shows no edema, both legs are warm to touch Musculoskeletal:No digital cyanosis Skin:No Rash, warm and dry Wounds:N/A  I have personally reviewed following labs and imaging studies  LABORATORY DATA: CBC: Recent Labs  Lab 10/06/18 1431 10/10/18 0636  WBC 4.8 4.9  NEUTROABS 2.9  --   HGB 13.8 13.0  HCT 43.5 40.7  MCV  102.6* 100.5*  PLT 157 166    Basic Metabolic Panel: Recent Labs  Lab 10/06/18 1431 10/08/18 0613 10/10/18 0636  NA 136 136 137  K 3.8 3.8 3.7  CL 102 104 104  CO2 24 25 27   GLUCOSE 88 97 85  BUN 12 6* 10  CREATININE 0.93 0.82 0.88  CALCIUM 8.9 8.4* 8.6*    GFR: Estimated Creatinine Clearance: 50.5 mL/min (by  C-G formula based on SCr of 0.88 mg/dL).  Liver Function Tests: No results for input(s): AST, ALT, ALKPHOS, BILITOT, PROT, ALBUMIN in the last 168 hours. No results for input(s): LIPASE, AMYLASE in the last 168 hours. No results for input(s): AMMONIA in the last 168 hours.  Coagulation Profile: Recent Labs  Lab 10/06/18 1457 10/07/18 0741 10/08/18 0613 10/09/18 0730 10/10/18 0636  INR 2.20 2.33 2.46 2.28 2.35    Cardiac Enzymes: Recent Labs  Lab 10/06/18 1431  TROPONINI <0.03    BNP (last 3 results) No results for input(s): PROBNP in the last 8760 hours.  HbA1C: No results for input(s): HGBA1C in the last 72 hours.  CBG: No results for input(s): GLUCAP in the last 168 hours.  Lipid Profile: No results for input(s): CHOL, HDL, LDLCALC, TRIG, CHOLHDL, LDLDIRECT in the last 72 hours.  Thyroid Function Tests: No results for input(s): TSH, T4TOTAL, FREET4, T3FREE, THYROIDAB in the last 72 hours.  Anemia Panel: No results for input(s): VITAMINB12, FOLATE, FERRITIN, TIBC, IRON, RETICCTPCT in the last 72 hours.  Urine analysis:    Component Value Date/Time   COLORURINE AMBER (A) 10/06/2018 1457   APPEARANCEUR CLEAR 10/06/2018 1457   LABSPEC 1.019 10/06/2018 1457   PHURINE 5.0 10/06/2018 1457   GLUCOSEU NEGATIVE 10/06/2018 1457   HGBUR NEGATIVE 10/06/2018 1457   BILIRUBINUR NEGATIVE 10/06/2018 1457   KETONESUR 5 (A) 10/06/2018 1457   PROTEINUR NEGATIVE 10/06/2018 1457   UROBILINOGEN 4.0 (H) 07/02/2018 1706   NITRITE NEGATIVE 10/06/2018 1457   LEUKOCYTESUR NEGATIVE 10/06/2018 1457    Sepsis Labs: Lactic Acid, Venous No results found for: LATICACIDVEN  MICROBIOLOGY: Recent Results (from the past 240 hour(s))  Urine culture     Status: Abnormal   Collection Time: 10/06/18  5:59 PM  Result Value Ref Range Status   Specimen Description URINE, RANDOM  Final   Special Requests NONE  Final   Culture (A)  Final    <10,000 COLONIES/mL INSIGNIFICANT  GROWTH Performed at [facility], [address] [place], [place] [address]    Report Status 10/07/2018 FINAL  Final    RADIOLOGY STUDIES/RESULTS: Ct Angio Head W Or Wo Contrast  Result Date: 10/06/2018 CLINICAL DATA:  Generalized weakness for 4 days. Assess TIA. History of stroke, hypertension and atrial fibrillation. EXAM: CT ANGIOGRAPHY HEAD AND NECK TECHNIQUE: Multidetector CT imaging of the head and neck was performed using the standard protocol during bolus administration of intravenous contrast. Multiplanar CT image reconstructions and MIPs were obtained to evaluate the vascular anatomy. Carotid stenosis measurements (when applicable) are obtained utilizing NASCET criteria, using the distal internal carotid diameter as the denominator. CONTRAST:  ISOVUE-370 IOPAMIDOL (ISOVUE-370) INJECTION 76% COMPARISON:  CT HEAD October 06, 2018 and MRI/MRA head November 27, 2017. FINDINGS: CTA NECK FINDINGS: AORTIC ARCH: Thoracic aorta is normal in course and caliber. Average RIGHT subclavian artery coursing posterior to trachea and esophagus. Moderate to severe calcific atherosclerosis arch vessel origins. Arch vessel origins are patent. RIGHT CAROTID SYSTEM: Common carotid artery is patent, moderate calcific atherosclerosis. Calcific atherosclerosis resulting in less  than 50% stenosis by NASCET criteria. Patent internal carotid artery. LEFT CAROTID SYSTEM: Common carotid artery is patent, moderate calcific atherosclerosis. Calcific atherosclerosis resulting in less than 50% stenosis by NASCET criteria. Patent internal carotid artery. VERTEBRAL ARTERIES:Left vertebral artery is dominant. Moderate stenosis LEFT vertebral artery origin. Patent vertebral arteries with mild extrinsic compression due to degenerative change. SKELETON: No acute osseous process though bone windows have not been submitted. Severe bilateral C3-4, RIGHT C4-5 neural foraminal narrowing. OTHER NECK: Soft tissues of the neck are  nonacute though, not tailored for evaluation. UPPER CHEST: Included lung apices are clear. Cardiomegaly. Abnormal enhancement anterior to the superior vena cava and ascending aortic, appearing vascular. CTA HEAD FINDINGS: ANTERIOR CIRCULATION: Patent cervical internal carotid arteries, petrous, cavernous and supra clinoid internal carotid arteries. Calcific atherosclerosis resulting in moderate stenosis RIGHT carotid siphon. Patent anterior communicating artery. Patent anterior and middle cerebral arteries, moderate luminal irregularity compatible with atherosclerosis. Severe stenosis LEFT A2 segment with transient suspected occlusion. Severe stenosis RIGHT M3 segment. No large vessel occlusion, contrast extravasation or aneurysm. POSTERIOR CIRCULATION: Patent vertebral arteries, vertebrobasilar junction and basilar artery, as well as main branch vessels. Patent posterior cerebral arteries, moderate luminal irregularity compatible with atherosclerosis. No large vessel occlusion, significant stenosis, contrast extravasation or aneurysm. VENOUS SINUSES: Major dural venous sinuses are patent though not tailored for evaluation on this angiographic examination. ANATOMIC VARIANTS: None. DELAYED PHASE: No abnormal intracranial enhancement. MIP images reviewed. IMPRESSION: CTA NECK: 1. Less than 50% stenosis bilateral ICA's. 2. Patent vertebral arteries, moderate stenosis LEFT V1 origin. 3. Abnormal anterior mediastinal vascular structure incompletely imaged, this could reflect enlarged RIGHT atrium or aneurysm. Recommend CT angiogram chest. 4. Severe C3-4 and C4-5 neural foraminal narrowing. CTA HEAD: 1. No emergent large vessel occlusion. 2. Severe LEFT A2 and RIGHT M3 stenosis. 3. Moderate intracranial atherosclerosis. Electronically Signed   By: Awilda Metroourtnay  Bloomer M.D.   On: 10/06/2018 21:54   Dg Chest 2 View  Result Date: 10/06/2018 CLINICAL DATA:  Weakness starting 4 days prior to imaging. EXAM: CHEST - 2 VIEW  COMPARISON:  10/26/2014 FINDINGS: Prominent enlargement of the cardiopericardial silhouette, similar to prior. The right heart is particularly enlarged. Atherosclerotic calcification of the aortic arch. Bandlike density in the right mid lung likely from atelectasis or scarring. The lungs appear otherwise clear. Mild thoracic spondylosis. No blunting of the costophrenic angles. Reduced right acromial humeral space implies potential rotator cuff tear. IMPRESSION: 1. Prominent but chronic enlargement of the cardiopericardial silhouette, without edema. 2. Right mid lung atelectasis or scarring. 3.  Aortic Atherosclerosis (ICD10-I70.0). 4. Thoracic spondylosis. Electronically Signed   By: Gaylyn RongWalter  Liebkemann M.D.   On: 10/06/2018 16:39   Ct Head Wo Contrast  Result Date: 10/06/2018 CLINICAL DATA:  Weakness over the last 4 days EXAM: CT HEAD WITHOUT CONTRAST TECHNIQUE: Contiguous axial images were obtained from the base of the skull through the vertex without intravenous contrast. COMPARISON:  11/27/2016 MRI and CT scans FINDINGS: Brain: Periventricular white matter and corona radiata hypodensities favor chronic ischemic microvascular white matter disease. Remote left frontal lacunar infarct. Faint calcification of the lentiform nuclei, chronic. Otherwise, the brainstem, cerebellum, cerebral peduncles, thalami, basal ganglia, basilar cisterns, and ventricular system appear within normal limits. No intracranial hemorrhage, mass lesion, or acute CVA. Vascular: Atherosclerotic calcification of the cavernous segment of the right internal carotid artery. Skull: Hyperostosis frontalis interna. Sinuses/Orbits: Considerable chronic maxillary and ethmoid sinusitis. Acute on chronic right sphenoid sinusitis. Other: No supplemental non-categorized findings. IMPRESSION: 1. Acute on chronic right sphenoid sinusitis.  Chronic paranasal sinusitis. 2. No acute intracranial findings. 3. Periventricular white matter and corona radiata  hypodensities favor chronic ischemic microvascular white matter disease. Old left frontal corona radiata lacunar infarct. Electronically Signed   By: Gaylyn Rong M.D.   On: 10/06/2018 16:43   Ct Angio Neck W Or Wo Contrast  Result Date: 10/06/2018 CLINICAL DATA:  Generalized weakness for 4 days. Assess TIA. History of stroke, hypertension and atrial fibrillation. EXAM: CT ANGIOGRAPHY HEAD AND NECK TECHNIQUE: Multidetector CT imaging of the head and neck was performed using the standard protocol during bolus administration of intravenous contrast. Multiplanar CT image reconstructions and MIPs were obtained to evaluate the vascular anatomy. Carotid stenosis measurements (when applicable) are obtained utilizing NASCET criteria, using the distal internal carotid diameter as the denominator. CONTRAST:  ISOVUE-370 IOPAMIDOL (ISOVUE-370) INJECTION 76% COMPARISON:  CT HEAD October 06, 2018 and MRI/MRA head November 27, 2017. FINDINGS: CTA NECK FINDINGS: AORTIC ARCH: Thoracic aorta is normal in course and caliber. Average RIGHT subclavian artery coursing posterior to trachea and esophagus. Moderate to severe calcific atherosclerosis arch vessel origins. Arch vessel origins are patent. RIGHT CAROTID SYSTEM: Common carotid artery is patent, moderate calcific atherosclerosis. Calcific atherosclerosis resulting in less than 50% stenosis by NASCET criteria. Patent internal carotid artery. LEFT CAROTID SYSTEM: Common carotid artery is patent, moderate calcific atherosclerosis. Calcific atherosclerosis resulting in less than 50% stenosis by NASCET criteria. Patent internal carotid artery. VERTEBRAL ARTERIES:Left vertebral artery is dominant. Moderate stenosis LEFT vertebral artery origin. Patent vertebral arteries with mild extrinsic compression due to degenerative change. SKELETON: No acute osseous process though bone windows have not been submitted. Severe bilateral C3-4, RIGHT C4-5 neural foraminal narrowing.  OTHER NECK: Soft tissues of the neck are nonacute though, not tailored for evaluation. UPPER CHEST: Included lung apices are clear. Cardiomegaly. Abnormal enhancement anterior to the superior vena cava and ascending aortic, appearing vascular. CTA HEAD FINDINGS: ANTERIOR CIRCULATION: Patent cervical internal carotid arteries, petrous, cavernous and supra clinoid internal carotid arteries. Calcific atherosclerosis resulting in moderate stenosis RIGHT carotid siphon. Patent anterior communicating artery. Patent anterior and middle cerebral arteries, moderate luminal irregularity compatible with atherosclerosis. Severe stenosis LEFT A2 segment with transient suspected occlusion. Severe stenosis RIGHT M3 segment. No large vessel occlusion, contrast extravasation or aneurysm. POSTERIOR CIRCULATION: Patent vertebral arteries, vertebrobasilar junction and basilar artery, as well as main branch vessels. Patent posterior cerebral arteries, moderate luminal irregularity compatible with atherosclerosis. No large vessel occlusion, significant stenosis, contrast extravasation or aneurysm. VENOUS SINUSES: Major dural venous sinuses are patent though not tailored for evaluation on this angiographic examination. ANATOMIC VARIANTS: None. DELAYED PHASE: No abnormal intracranial enhancement. MIP images reviewed. IMPRESSION: CTA NECK: 1. Less than 50% stenosis bilateral ICA's. 2. Patent vertebral arteries, moderate stenosis LEFT V1 origin. 3. Abnormal anterior mediastinal vascular structure incompletely imaged, this could reflect enlarged RIGHT atrium or aneurysm. Recommend CT angiogram chest. 4. Severe C3-4 and C4-5 neural foraminal narrowing. CTA HEAD: 1. No emergent large vessel occlusion. 2. Severe LEFT A2 and RIGHT M3 stenosis. 3. Moderate intracranial atherosclerosis. Electronically Signed   By: Awilda Metro M.D.   On: 10/06/2018 21:54   Ct Angio Chest Pe W Or Wo Contrast  Result Date: 10/07/2018 CLINICAL DATA:   Worsening dyspnea. Abnormal vascular structure in superior mediastinum on recent neck CTA. EXAM: CT ANGIOGRAPHY CHEST WITH CONTRAST TECHNIQUE: Multidetector CT imaging of the chest was performed using the standard protocol during bolus administration of intravenous contrast. Multiplanar CT image reconstructions and MIPs were obtained to evaluate the  vascular anatomy. CONTRAST:  75mL ISOVUE-370 IOPAMIDOL (ISOVUE-370) INJECTION 76% COMPARISON:  Neck CTA on 10/06/2018; no prior chest CT FINDINGS: Cardiovascular: Marked cardiomegaly is seen with severe right atrial enlargement. Dilated right atrial appendage corresponds to the mediastinal abnormality seen on recent neck CTA. Aberrant origin of right subclavian artery incidentally noted. Satisfactory opacification of pulmonary arteries noted, and no pulmonary emboli identified. No evidence of thoracic aorticaneurysm. Aortic and coronary arterial atherosclerosis. Mediastinum/Nodes: No masses or pathologically enlarged lymph nodes identified. Lungs/Pleura: No pulmonary mass, infiltrate, or effusion. Upper abdomen: Distension and contrast opacification of IVC and hepatic veins is consistent with right heart insufficiency. Musculoskeletal: No suspicious bone lesions identified. Review of the MIP images confirms the above findings. IMPRESSION: No evidence of pulmonary embolism or other acute findings. Marked cardiomegaly with severe right atrial enlargement, consistent with tricuspid insufficiency. Distention of IVC and hepatic veins is also consistent with right heart insufficiency. Incidentally noted aberrant origin of right subclavian artery. Aortic and coronary arterial atherosclerosis. Electronically Signed   By: Myles Rosenthal M.D.   On: 10/07/2018 21:24   Mr Brain Wo Contrast  Result Date: 10/07/2018 CLINICAL DATA:  Ataxia. EXAM: MRI HEAD WITHOUT CONTRAST TECHNIQUE: Multiplanar, multiecho pulse sequences of the brain and surrounding structures were obtained without  intravenous contrast. COMPARISON:  CTA head neck 10/06/2018 FINDINGS: BRAIN: There is no acute infarct, acute hemorrhage, hydrocephalus or extra-axial collection. The midline structures are normal. No midline shift or other mass effect. Old, small left frontal infarct. Multifocal white matter hyperintensity, most commonly due to chronic ischemic microangiopathy. The cerebral and cerebellar volume are age-appropriate. Susceptibility-sensitive sequences show no chronic microhemorrhage or superficial siderosis. VASCULAR: Major intracranial arterial and venous sinus flow voids are normal. SKULL AND UPPER CERVICAL SPINE: Calvarial bone marrow signal is normal. There is no skull base mass. Visualized upper cervical spine and soft tissues are normal. SINUSES/ORBITS: Moderate-to-severe bilateral maxillary and ethmoid sinus mucosal thickening. Mild right sphenoid mucosal thickening. No mastoid or middle ear effusion. The orbits are normal. IMPRESSION: 1. No acute intracranial abnormality. 2. Chronic small vessel ischemia and old left frontal infarct. 3. Moderate paranasal sinus disease. Electronically Signed   By: Deatra Robinson M.D.   On: 10/07/2018 05:25     LOS: 0 days   Jeoffrey Massed, MD  [facility]  If 7PM-7AM, please contact night-coverage  Please page via [email or web address] [email or web address]-click on MD name and type text message  10/10/2018, 2:30 PM

## 2018-10-10 NOTE — Progress Notes (Signed)
ANTICOAGULATION CONSULT NOTE - Follow Up Consult  Pharmacy Consult for Coumadin Indication: afib and h/o CVA  No Known Allergies  Patient Measurements: Height: 5\' 3"  (160 cm) Weight: 161 lb 2.5 oz (73.1 kg) IBW/kg (Calculated) : 52.4  Vital Signs: Temp: 98.2 F (36.8 C) (11/27 0756) Temp Source: Oral (11/27 0756) BP: 127/73 (11/27 0756) Pulse Rate: 79 (11/27 0756)  Labs: Recent Labs    10/08/18 0613 10/09/18 0730 10/10/18 0636  HGB  --   --  13.0  HCT  --   --  40.7  PLT  --   --  166  LABPROT 26.4* 24.8* 25.4*  INR 2.46 2.28 2.35  CREATININE 0.82  --  0.88    Estimated Creatinine Clearance: 50.5 mL/min (by C-G formula based on SCr of 0.88 mg/dL).  Assessment:  Anticoag: PTA warfarin for Afib and h/o CVA. INR 2.35 today, CBC WNL.  - PTA dose: 7.5mg  MWF, 5mg  TTSS, admit INR 2.2   Goal of Therapy:  INR 2-3 Monitor platelets by anticoagulation protocol: Yes   Plan:  Continue home dose of Coumadin 7.5mg  MWF, 5mg  TTSS Daily INR  Larell Baney S. Merilynn Finlandobertson, PharmD, BCPS Clinical Staff Pharmacist Misty Stanleyobertson, Silvia Hightower Stillinger 10/10/2018,9:55 AM

## 2018-10-10 NOTE — Clinical Social Work Note (Signed)
Clinical Social Work Assessment  Patient Details  Name: Joanna Riffleatricia A Bayles MRN: [id number] Date of Birth: Apr 22, 1941  Date of referral:  10/10/18               Reason for consult:  Facility Placement                Permission sought to share information with:  Facility Medical sales representativeContact Representative, Family Supports Permission granted to share information::  Yes, Verbal Permission Granted  Name::     Product/process development scientistTawana  Agency::  SNFs  Relationship::  Daughter  Contact Information:  [phone]  Housing/Transportation Living arrangements for the past 2 months:  Single Family Home Source of Information:  Patient, Adult Children Patient Interpreter Needed:  None Criminal Activity/Legal Involvement Pertinent to Current Situation/Hospitalization:  No - Comment as needed Significant Relationships:  Adult Children Lives with:  Self Do you feel safe going back to the place where you live?  No Need for family participation in patient care:  Yes (Comment)  Care giving concerns:  CSW received consult for possible SNF placement at time of discharge. CSW spoke with patient and daughter regarding PT recommendation of SNF placement at time of discharge. Patient's duaghter reported that given patient's current physical needs and fall risk, she is afraid for her to return home at this time. Patient's daughter expressed understanding of PT recommendation and is agreeable to SNF placement at time of discharge. CSW to continue to follow and assist with discharge planning needs.   Social Worker assessment / plan:  CSW spoke with patient and daughter [facility].  Employment status:  Retired Database administratornsurance information:  Managed Medicare PT Recommendations:  Skilled Nursing Facility Information / Referral to community resources:  Skilled Nursing Facility  Patient/Family's Response to care:  Patient and daughter recognize need for rehab before returning home and [place] Patient's daughter requested [facility]. They have started insurance authorization process.   Patient/Family's Understanding of and Emotional Response to Diagnosis, Current Treatment, and Prognosis:  Patient/family is realistic regarding therapy needs and expressed being hopeful for SNF placement. Patient expressed understanding of CSW role and discharge process as well as medical condition. No questions/concerns about plan or treatment.    Emotional Assessment Appearance:  Appears stated age Attitude/Demeanor/Rapport:  Engaged Affect (typically observed):  Accepting, Appropriate Orientation:  Oriented to Self, Oriented to Place, Oriented to Situation Alcohol / Substance use:  Not Applicable Psych involvement (Current and /or in the community):  No (Comment)  Discharge Needs  Concerns to be addressed:  Care Coordination Readmission within the last 30 days:  No Current discharge risk:  None Barriers to Discharge:  Continued Medical Work up   [employer] S Krystale Rinkenberger, LCSW 10/10/2018, 12:57 PM

## 2018-10-11 LAB — PROTIME-INR
INR: 2.77
Prothrombin Time: 28.8 seconds — ABNORMAL HIGH (ref 11.4–15.2)

## 2018-10-11 MED ORDER — WARFARIN SODIUM 2.5 MG PO TABS
2.5000 mg | ORAL_TABLET | Freq: Once | ORAL | Status: AC
  Administered 2018-10-11: 2.5 mg via ORAL
  Filled 2018-10-11: qty 1

## 2018-10-11 NOTE — Progress Notes (Signed)
ANTICOAGULATION CONSULT NOTE - Follow Up Consult  Pharmacy Consult for Coumadin Indication: afib and h/o CVA  No Known Allergies  Patient Measurements: Height: 5\' 3"  (160 cm) Weight: 161 lb 2.5 oz (73.1 kg) IBW/kg (Calculated) : 52.4  Vital Signs: Temp: 98.2 F (36.8 C) (11/28 0812) Temp Source: Oral (11/28 0812) BP: 130/82 (11/28 0812) Pulse Rate: 89 (11/28 0812)  Labs: Recent Labs    10/09/18 0730 10/10/18 0636 10/11/18 0252  HGB  --  13.0  --   HCT  --  40.7  --   PLT  --  166  --   LABPROT 24.8* 25.4* 28.8*  INR 2.28 2.35 2.77  CREATININE  --  0.88  --     Estimated Creatinine Clearance: 50.5 mL/min (by C-G formula based on SCr of 0.88 mg/dL).  Assessment:  Anticoag: PTA warfarin for Afib and h/o CVA. INR therapeutic at 2.77 today, CBC WNL. No reports of bleeding - PTA dose: 7.5mg  MWF, 5mg  TTSS, admit INR 2.2   Goal of Therapy:  INR 2-3 Monitor platelets by anticoagulation protocol: Yes   Plan:  Warfarin 2.5mg  x1 today due to quick increase in INR. Will monitor and adjust back to home dose based on INR trends Monitor daily INR, CBC, s/sx bleeding   Lenward Chancelloranya Paizleigh Wilds, PharmD PGY1 Pharmacy Resident Phone [phone] 10/11/2018 8:34 AM

## 2018-10-11 NOTE — Progress Notes (Signed)
PROGRESS NOTE        PATIENT DETAILS Name: Joanna [name] Age: 77 y.o. Sex: female Date of Birth: 05/21/1941 Admit Date: 10/06/2018 Admitting Physician Haydee Monica, MD WJX:[id number], Lanora Manis, MD  Brief Narrative: Patient is a 77 y.o. female with prior history of CVA with chronic right-sided hemiparesis, hypertension, chronic atrial fibrillation, chronic diastolic heart failure-presented to the ED with concerns about worsening right-sided hemiparesis and a URI symptoms, upon further evaluation thought to have recrudescence of previous stroke due to ongoing bronchitis.  See below for further details  Subjective: Lying comfortably in bed-wanting to know if insurance is approved SNF placement.  Denies any chest pain or shortness of breath.  Assessment/Plan: Recrudescence of prior stroke due to ongoing bronchitis: Underwent extensive work-up including MRI brain, CTA head and neck without any acute CVA.  She is back to her usual baseline.  Continue Coumadin.  No further recommendations from stroke team.  Chronic diastolic heart failure: Compensated-continue beta-blocker.  Chronic atrial fibrillation: INR therapeutic-rate controlled with Toprol and Cardizem.  Continue Coumadin being managed by pharmacy while inpatient.  Generalized weakness/debility/high risk of falls: Due to acute illness-suspect has some amount of debility at baseline-evaluated by PT-recommendations are for SNF.  DVT Prophylaxis: Full dose anticoagulation with Coumadin  Code Status: Full code   Family Communication: None at bedside  Disposition Plan: Remain inpatient- SNF on discharge-awaiting bed placement-social work following.  Antimicrobial agents: Anti-infectives (From admission, onward)   None      Procedures: None  CONSULTS:  neurology  Time spent: 25 minutes-Greater than 50% of this time was spent in counseling, explanation of diagnosis, planning of further  management, and coordination of care.  MEDICATIONS: Scheduled Meds: . diltiazem  120 mg Oral Daily  . guaiFENesin  1,200 mg Oral BID  . metoprolol succinate  50 mg Oral Daily  . warfarin  2.5 mg Oral ONCE-1800  . Warfarin - Pharmacist Dosing Inpatient   Does not apply q1800   Continuous Infusions: PRN Meds:.acetaminophen **OR** acetaminophen (TYLENOL) oral liquid 160 mg/5 mL **OR** acetaminophen   PHYSICAL EXAM: Vital signs: Vitals:   10/10/18 2220 10/11/18 0036 10/11/18 0512 10/11/18 0812  BP: 117/71 126/80 135/75 130/82  Pulse: 68 66 82 89  Resp: 20 20 20 20   Temp: 98.3 F (36.8 C) 98.1 F (36.7 C) 98.5 F (36.9 C) 98.2 F (36.8 C)  TempSrc: Oral Oral Oral Oral  SpO2: 93% 93% 93% 96%  Weight:      Height:       Filed Weights   10/06/18 1348 10/06/18 2147  Weight: 66.7 kg 73.1 kg   Body mass index is 28.55 kg/m.   General appearance :Awake, alert, not in any distress. Eyes: pupils equally reactive to light and accomodation,no scleral icterus. HEENT: Atraumatic and Normocephalic Neck: supple Resp:Good air entry bilaterally, no added sounds  CVS: S1 S2 regular, no murmurs.  GI: Bowel sounds present, Non tender and not distended with no gaurding, rigidity or rebound.No organomegaly Extremities: B/L Lower Ext shows no edema, both legs are warm to touch Neuro: Very mild right-sided deficits-unchanged. Musculoskeletal:No digital cyanosis Skin:No Rash, warm and dry Wounds:N/A  I have personally reviewed following labs and imaging studies  LABORATORY DATA: CBC: Recent Labs  Lab 10/06/18 1431 10/10/18 0636  WBC 4.8 4.9  NEUTROABS 2.9  --   HGB 13.8 13.0  HCT 43.5  40.7  MCV 102.6* 100.5*  PLT 157 166    Basic Metabolic Panel: Recent Labs  Lab 10/06/18 1431 10/08/18 0613 10/10/18 0636  NA 136 136 137  K 3.8 3.8 3.7  CL 102 104 104  CO2 24 25 27   GLUCOSE 88 97 85  BUN 12 6* 10  CREATININE 0.93 0.82 0.88  CALCIUM 8.9 8.4* 8.6*    GFR: Estimated  Creatinine Clearance: 50.5 mL/min (by C-G formula based on SCr of 0.88 mg/dL).  Liver Function Tests: No results for input(s): AST, ALT, ALKPHOS, BILITOT, PROT, ALBUMIN in the last 168 hours. No results for input(s): LIPASE, AMYLASE in the last 168 hours. No results for input(s): AMMONIA in the last 168 hours.  Coagulation Profile: Recent Labs  Lab 10/07/18 0741 10/08/18 0613 10/09/18 0730 10/10/18 0636 10/11/18 0252  INR 2.33 2.46 2.28 2.35 2.77    Cardiac Enzymes: Recent Labs  Lab 10/06/18 1431  TROPONINI <0.03    BNP (last 3 results) No results for input(s): PROBNP in the last 8760 hours.  HbA1C: No results for input(s): HGBA1C in the last 72 hours.  CBG: No results for input(s): GLUCAP in the last 168 hours.  Lipid Profile: No results for input(s): CHOL, HDL, LDLCALC, TRIG, CHOLHDL, LDLDIRECT in the last 72 hours.  Thyroid Function Tests: No results for input(s): TSH, T4TOTAL, FREET4, T3FREE, THYROIDAB in the last 72 hours.  Anemia Panel: No results for input(s): VITAMINB12, FOLATE, FERRITIN, TIBC, IRON, RETICCTPCT in the last 72 hours.  Urine analysis:    Component Value Date/Time   COLORURINE AMBER (A) 10/06/2018 1457   APPEARANCEUR CLEAR 10/06/2018 1457   LABSPEC 1.019 10/06/2018 1457   PHURINE 5.0 10/06/2018 1457   GLUCOSEU NEGATIVE 10/06/2018 1457   HGBUR NEGATIVE 10/06/2018 1457   BILIRUBINUR NEGATIVE 10/06/2018 1457   KETONESUR 5 (A) 10/06/2018 1457   PROTEINUR NEGATIVE 10/06/2018 1457   UROBILINOGEN 4.0 (H) 07/02/2018 1706   NITRITE NEGATIVE 10/06/2018 1457   LEUKOCYTESUR NEGATIVE 10/06/2018 1457    Sepsis Labs: Lactic Acid, Venous No results found for: LATICACIDVEN  MICROBIOLOGY: Recent Results (from the past 240 hour(s))  Urine culture     Status: Abnormal   Collection Time: 10/06/18  5:59 PM  Result Value Ref Range Status   Specimen Description URINE, RANDOM  Final   Special Requests NONE  Final   Culture (A)  Final    <10,000  COLONIES/mL INSIGNIFICANT GROWTH Performed at [facility], [address], [place], [place] [address]    Report Status 10/07/2018 FINAL  Final    RADIOLOGY STUDIES/RESULTS: Ct Angio Head W Or Wo Contrast  Result Date: 10/06/2018 CLINICAL DATA:  Generalized weakness for 4 days. Assess TIA. History of stroke, hypertension and atrial fibrillation. EXAM: CT ANGIOGRAPHY HEAD AND NECK TECHNIQUE: Multidetector CT imaging of the head and neck was performed using the standard protocol during bolus administration of intravenous contrast. Multiplanar CT image reconstructions and MIPs were obtained to evaluate the vascular anatomy. Carotid stenosis measurements (when applicable) are obtained utilizing NASCET criteria, using the distal internal carotid diameter as the denominator. CONTRAST:  ISOVUE-370 IOPAMIDOL (ISOVUE-370) INJECTION 76% COMPARISON:  CT HEAD October 06, 2018 and MRI/MRA head November 27, 2017. FINDINGS: CTA NECK FINDINGS: AORTIC ARCH: Thoracic aorta is normal in course and caliber. Average RIGHT subclavian artery coursing posterior to trachea and esophagus. Moderate to severe calcific atherosclerosis arch vessel origins. Arch vessel origins are patent. RIGHT CAROTID SYSTEM: Common carotid artery is patent, moderate calcific atherosclerosis. Calcific atherosclerosis  resulting in less than 50% stenosis by NASCET criteria. Patent internal carotid artery. LEFT CAROTID SYSTEM: Common carotid artery is patent, moderate calcific atherosclerosis. Calcific atherosclerosis resulting in less than 50% stenosis by NASCET criteria. Patent internal carotid artery. VERTEBRAL ARTERIES:Left vertebral artery is dominant. Moderate stenosis LEFT vertebral artery origin. Patent vertebral arteries with mild extrinsic compression due to degenerative change. SKELETON: No acute osseous process though bone windows have not been submitted. Severe bilateral C3-4, RIGHT C4-5 neural foraminal narrowing. OTHER NECK:  Soft tissues of the neck are nonacute though, not tailored for evaluation. UPPER CHEST: Included lung apices are clear. Cardiomegaly. Abnormal enhancement anterior to the superior vena cava and ascending aortic, appearing vascular. CTA HEAD FINDINGS: ANTERIOR CIRCULATION: Patent cervical internal carotid arteries, petrous, cavernous and supra clinoid internal carotid arteries. Calcific atherosclerosis resulting in moderate stenosis RIGHT carotid siphon. Patent anterior communicating artery. Patent anterior and middle cerebral arteries, moderate luminal irregularity compatible with atherosclerosis. Severe stenosis LEFT A2 segment with transient suspected occlusion. Severe stenosis RIGHT M3 segment. No large vessel occlusion, contrast extravasation or aneurysm. POSTERIOR CIRCULATION: Patent vertebral arteries, vertebrobasilar junction and basilar artery, as well as main branch vessels. Patent posterior cerebral arteries, moderate luminal irregularity compatible with atherosclerosis. No large vessel occlusion, significant stenosis, contrast extravasation or aneurysm. VENOUS SINUSES: Major dural venous sinuses are patent though not tailored for evaluation on this angiographic examination. ANATOMIC VARIANTS: None. DELAYED PHASE: No abnormal intracranial enhancement. MIP images reviewed. IMPRESSION: CTA NECK: 1. Less than 50% stenosis bilateral ICA's. 2. Patent vertebral arteries, moderate stenosis LEFT V1 origin. 3. Abnormal anterior mediastinal vascular structure incompletely imaged, this could reflect enlarged RIGHT atrium or aneurysm. Recommend CT angiogram chest. 4. Severe C3-4 and C4-5 neural foraminal narrowing. CTA HEAD: 1. No emergent large vessel occlusion. 2. Severe LEFT A2 and RIGHT M3 stenosis. 3. Moderate intracranial atherosclerosis. Electronically Signed   By: Awilda Metroourtnay  Bloomer M.D.   On: 10/06/2018 21:54   Dg Chest 2 View  Result Date: 10/06/2018 CLINICAL DATA:  Weakness starting 4 days prior to  imaging. EXAM: CHEST - 2 VIEW COMPARISON:  10/26/2014 FINDINGS: Prominent enlargement of the cardiopericardial silhouette, similar to prior. The right heart is particularly enlarged. Atherosclerotic calcification of the aortic arch. Bandlike density in the right mid lung likely from atelectasis or scarring. The lungs appear otherwise clear. Mild thoracic spondylosis. No blunting of the costophrenic angles. Reduced right acromial humeral space implies potential rotator cuff tear. IMPRESSION: 1. Prominent but chronic enlargement of the cardiopericardial silhouette, without edema. 2. Right mid lung atelectasis or scarring. 3.  Aortic Atherosclerosis (ICD10-I70.0). 4. Thoracic spondylosis. Electronically Signed   By: Gaylyn RongWalter  Liebkemann M.D.   On: 10/06/2018 16:39   Ct Head Wo Contrast  Result Date: 10/06/2018 CLINICAL DATA:  Weakness over the last 4 days EXAM: CT HEAD WITHOUT CONTRAST TECHNIQUE: Contiguous axial images were obtained from the base of the skull through the vertex without intravenous contrast. COMPARISON:  11/27/2016 MRI and CT scans FINDINGS: Brain: Periventricular white matter and corona radiata hypodensities favor chronic ischemic microvascular white matter disease. Remote left frontal lacunar infarct. Faint calcification of the lentiform nuclei, chronic. Otherwise, the brainstem, cerebellum, cerebral peduncles, thalami, basal ganglia, basilar cisterns, and ventricular system appear within normal limits. No intracranial hemorrhage, mass lesion, or acute CVA. Vascular: Atherosclerotic calcification of the cavernous segment of the right internal carotid artery. Skull: Hyperostosis frontalis interna. Sinuses/Orbits: Considerable chronic maxillary and ethmoid sinusitis. Acute on chronic right sphenoid sinusitis. Other: No supplemental non-categorized findings. IMPRESSION: 1. Acute on chronic  right sphenoid sinusitis. Chronic paranasal sinusitis. 2. No acute intracranial findings. 3. Periventricular  white matter and corona radiata hypodensities favor chronic ischemic microvascular white matter disease. Old left frontal corona radiata lacunar infarct. Electronically Signed   By: Gaylyn Rong M.D.   On: 10/06/2018 16:43   Ct Angio Neck W Or Wo Contrast  Result Date: 10/06/2018 CLINICAL DATA:  Generalized weakness for 4 days. Assess TIA. History of stroke, hypertension and atrial fibrillation. EXAM: CT ANGIOGRAPHY HEAD AND NECK TECHNIQUE: Multidetector CT imaging of the head and neck was performed using the standard protocol during bolus administration of intravenous contrast. Multiplanar CT image reconstructions and MIPs were obtained to evaluate the vascular anatomy. Carotid stenosis measurements (when applicable) are obtained utilizing NASCET criteria, using the distal internal carotid diameter as the denominator. CONTRAST:  ISOVUE-370 IOPAMIDOL (ISOVUE-370) INJECTION 76% COMPARISON:  CT HEAD October 06, 2018 and MRI/MRA head November 27, 2017. FINDINGS: CTA NECK FINDINGS: AORTIC ARCH: Thoracic aorta is normal in course and caliber. Average RIGHT subclavian artery coursing posterior to trachea and esophagus. Moderate to severe calcific atherosclerosis arch vessel origins. Arch vessel origins are patent. RIGHT CAROTID SYSTEM: Common carotid artery is patent, moderate calcific atherosclerosis. Calcific atherosclerosis resulting in less than 50% stenosis by NASCET criteria. Patent internal carotid artery. LEFT CAROTID SYSTEM: Common carotid artery is patent, moderate calcific atherosclerosis. Calcific atherosclerosis resulting in less than 50% stenosis by NASCET criteria. Patent internal carotid artery. VERTEBRAL ARTERIES:Left vertebral artery is dominant. Moderate stenosis LEFT vertebral artery origin. Patent vertebral arteries with mild extrinsic compression due to degenerative change. SKELETON: No acute osseous process though bone windows have not been submitted. Severe bilateral C3-4, RIGHT  C4-5 neural foraminal narrowing. OTHER NECK: Soft tissues of the neck are nonacute though, not tailored for evaluation. UPPER CHEST: Included lung apices are clear. Cardiomegaly. Abnormal enhancement anterior to the superior vena cava and ascending aortic, appearing vascular. CTA HEAD FINDINGS: ANTERIOR CIRCULATION: Patent cervical internal carotid arteries, petrous, cavernous and supra clinoid internal carotid arteries. Calcific atherosclerosis resulting in moderate stenosis RIGHT carotid siphon. Patent anterior communicating artery. Patent anterior and middle cerebral arteries, moderate luminal irregularity compatible with atherosclerosis. Severe stenosis LEFT A2 segment with transient suspected occlusion. Severe stenosis RIGHT M3 segment. No large vessel occlusion, contrast extravasation or aneurysm. POSTERIOR CIRCULATION: Patent vertebral arteries, vertebrobasilar junction and basilar artery, as well as main branch vessels. Patent posterior cerebral arteries, moderate luminal irregularity compatible with atherosclerosis. No large vessel occlusion, significant stenosis, contrast extravasation or aneurysm. VENOUS SINUSES: Major dural venous sinuses are patent though not tailored for evaluation on this angiographic examination. ANATOMIC VARIANTS: None. DELAYED PHASE: No abnormal intracranial enhancement. MIP images reviewed. IMPRESSION: CTA NECK: 1. Less than 50% stenosis bilateral ICA's. 2. Patent vertebral arteries, moderate stenosis LEFT V1 origin. 3. Abnormal anterior mediastinal vascular structure incompletely imaged, this could reflect enlarged RIGHT atrium or aneurysm. Recommend CT angiogram chest. 4. Severe C3-4 and C4-5 neural foraminal narrowing. CTA HEAD: 1. No emergent large vessel occlusion. 2. Severe LEFT A2 and RIGHT M3 stenosis. 3. Moderate intracranial atherosclerosis. Electronically Signed   By: Awilda Metro M.D.   On: 10/06/2018 21:54   Ct Angio Chest Pe W Or Wo Contrast  Result Date:  10/07/2018 CLINICAL DATA:  Worsening dyspnea. Abnormal vascular structure in superior mediastinum on recent neck CTA. EXAM: CT ANGIOGRAPHY CHEST WITH CONTRAST TECHNIQUE: Multidetector CT imaging of the chest was performed using the standard protocol during bolus administration of intravenous contrast. Multiplanar CT image reconstructions and MIPs were obtained  to evaluate the vascular anatomy. CONTRAST:  75mL ISOVUE-370 IOPAMIDOL (ISOVUE-370) INJECTION 76% COMPARISON:  Neck CTA on 10/06/2018; no prior chest CT FINDINGS: Cardiovascular: Marked cardiomegaly is seen with severe right atrial enlargement. Dilated right atrial appendage corresponds to the mediastinal abnormality seen on recent neck CTA. Aberrant origin of right subclavian artery incidentally noted. Satisfactory opacification of pulmonary arteries noted, and no pulmonary emboli identified. No evidence of thoracic aorticaneurysm. Aortic and coronary arterial atherosclerosis. Mediastinum/Nodes: No masses or pathologically enlarged lymph nodes identified. Lungs/Pleura: No pulmonary mass, infiltrate, or effusion. Upper abdomen: Distension and contrast opacification of IVC and hepatic veins is consistent with right heart insufficiency. Musculoskeletal: No suspicious bone lesions identified. Review of the MIP images confirms the above findings. IMPRESSION: No evidence of pulmonary embolism or other acute findings. Marked cardiomegaly with severe right atrial enlargement, consistent with tricuspid insufficiency. Distention of IVC and hepatic veins is also consistent with right heart insufficiency. Incidentally noted aberrant origin of right subclavian artery. Aortic and coronary arterial atherosclerosis. Electronically Signed   By: Myles Rosenthal M.D.   On: 10/07/2018 21:24   Mr Brain Wo Contrast  Result Date: 10/07/2018 CLINICAL DATA:  Ataxia. EXAM: MRI HEAD WITHOUT CONTRAST TECHNIQUE: Multiplanar, multiecho pulse sequences of the brain and surrounding  structures were obtained without intravenous contrast. COMPARISON:  CTA head neck 10/06/2018 FINDINGS: BRAIN: There is no acute infarct, acute hemorrhage, hydrocephalus or extra-axial collection. The midline structures are normal. No midline shift or other mass effect. Old, small left frontal infarct. Multifocal white matter hyperintensity, most commonly due to chronic ischemic microangiopathy. The cerebral and cerebellar volume are age-appropriate. Susceptibility-sensitive sequences show no chronic microhemorrhage or superficial siderosis. VASCULAR: Major intracranial arterial and venous sinus flow voids are normal. SKULL AND UPPER CERVICAL SPINE: Calvarial bone marrow signal is normal. There is no skull base mass. Visualized upper cervical spine and soft tissues are normal. SINUSES/ORBITS: Moderate-to-severe bilateral maxillary and ethmoid sinus mucosal thickening. Mild right sphenoid mucosal thickening. No mastoid or middle ear effusion. The orbits are normal. IMPRESSION: 1. No acute intracranial abnormality. 2. Chronic small vessel ischemia and old left frontal infarct. 3. Moderate paranasal sinus disease. Electronically Signed   By: Deatra Robinson M.D.   On: 10/07/2018 05:25     LOS: 0 days   Jeoffrey Massed, MD  [facility]  If 7PM-7AM, please contact night-coverage  Please page via [email or web address] [email or web address]-click on MD name and type text message  10/11/2018, 9:36 AM

## 2018-10-12 DIAGNOSIS — Z743 Need for continuous supervision: Secondary | ICD-10-CM | POA: Diagnosis not present

## 2018-10-12 DIAGNOSIS — I129 Hypertensive chronic kidney disease with stage 1 through stage 4 chronic kidney disease, or unspecified chronic kidney disease: Secondary | ICD-10-CM | POA: Diagnosis not present

## 2018-10-12 DIAGNOSIS — R5381 Other malaise: Secondary | ICD-10-CM | POA: Diagnosis not present

## 2018-10-12 DIAGNOSIS — N950 Postmenopausal bleeding: Secondary | ICD-10-CM | POA: Diagnosis not present

## 2018-10-12 DIAGNOSIS — M6281 Muscle weakness (generalized): Secondary | ICD-10-CM | POA: Diagnosis not present

## 2018-10-12 DIAGNOSIS — I484 Atypical atrial flutter: Secondary | ICD-10-CM | POA: Diagnosis not present

## 2018-10-12 DIAGNOSIS — I5030 Unspecified diastolic (congestive) heart failure: Secondary | ICD-10-CM | POA: Diagnosis not present

## 2018-10-12 DIAGNOSIS — I2510 Atherosclerotic heart disease of native coronary artery without angina pectoris: Secondary | ICD-10-CM | POA: Diagnosis not present

## 2018-10-12 DIAGNOSIS — G458 Other transient cerebral ischemic attacks and related syndromes: Secondary | ICD-10-CM | POA: Diagnosis not present

## 2018-10-12 DIAGNOSIS — I252 Old myocardial infarction: Secondary | ICD-10-CM | POA: Diagnosis not present

## 2018-10-12 DIAGNOSIS — J208 Acute bronchitis due to other specified organisms: Secondary | ICD-10-CM | POA: Diagnosis not present

## 2018-10-12 DIAGNOSIS — J209 Acute bronchitis, unspecified: Secondary | ICD-10-CM | POA: Diagnosis not present

## 2018-10-12 DIAGNOSIS — I6930 Unspecified sequelae of cerebral infarction: Secondary | ICD-10-CM | POA: Diagnosis not present

## 2018-10-12 DIAGNOSIS — I480 Paroxysmal atrial fibrillation: Secondary | ICD-10-CM | POA: Diagnosis not present

## 2018-10-12 DIAGNOSIS — E0800 Diabetes mellitus due to underlying condition with hyperosmolarity without nonketotic hyperglycemic-hyperosmolar coma (NKHHC): Secondary | ICD-10-CM | POA: Diagnosis not present

## 2018-10-12 DIAGNOSIS — R279 Unspecified lack of coordination: Secondary | ICD-10-CM | POA: Diagnosis not present

## 2018-10-12 DIAGNOSIS — Z9181 History of falling: Secondary | ICD-10-CM | POA: Diagnosis not present

## 2018-10-12 DIAGNOSIS — G459 Transient cerebral ischemic attack, unspecified: Secondary | ICD-10-CM | POA: Diagnosis not present

## 2018-10-12 DIAGNOSIS — I10 Essential (primary) hypertension: Secondary | ICD-10-CM | POA: Diagnosis not present

## 2018-10-12 DIAGNOSIS — I69351 Hemiplegia and hemiparesis following cerebral infarction affecting right dominant side: Secondary | ICD-10-CM | POA: Diagnosis not present

## 2018-10-12 DIAGNOSIS — R2681 Unsteadiness on feet: Secondary | ICD-10-CM | POA: Diagnosis not present

## 2018-10-12 DIAGNOSIS — I509 Heart failure, unspecified: Secondary | ICD-10-CM | POA: Diagnosis not present

## 2018-10-12 DIAGNOSIS — I5032 Chronic diastolic (congestive) heart failure: Secondary | ICD-10-CM | POA: Diagnosis not present

## 2018-10-12 DIAGNOSIS — I482 Chronic atrial fibrillation, unspecified: Secondary | ICD-10-CM | POA: Diagnosis not present

## 2018-10-12 DIAGNOSIS — Z7901 Long term (current) use of anticoagulants: Secondary | ICD-10-CM | POA: Diagnosis not present

## 2018-10-12 LAB — PROTIME-INR
INR: 2.79
PROTHROMBIN TIME: 29 s — AB (ref 11.4–15.2)

## 2018-10-12 MED ORDER — METOPROLOL SUCCINATE ER 50 MG PO TB24: 50.0000 mg | ORAL_TABLET | Freq: Every day | ORAL | Status: DC

## 2018-10-12 NOTE — Progress Notes (Signed)
ANTICOAGULATION CONSULT NOTE - Follow Up Consult  Pharmacy Consult for Coumadin Indication: afib and h/o CVA  No Known Allergies  Patient Measurements: Height: 5\' 3"  (160 cm) Weight: 161 lb 2.5 oz (73.1 kg) IBW/kg (Calculated) : 52.4  Vital Signs: Temp: 98.2 F (36.8 C) (11/29 1131) Temp Source: Oral (11/29 1131) BP: 105/69 (11/29 1131) Pulse Rate: 69 (11/29 1131)  Labs: Recent Labs    10/10/18 0636 10/11/18 0252 10/12/18 0527  HGB 13.0  --   --   HCT 40.7  --   --   PLT 166  --   --   LABPROT 25.4* 28.8* 29.0*  INR 2.35 2.77 2.79  CREATININE 0.88  --   --     Estimated Creatinine Clearance: 50.5 mL/min (by C-G formula based on SCr of 0.88 mg/dL).  Assessment: Joanna [name] on warfarin at home for AFib and hx of CVA. INR remains therapeutic, plans noted for patient to discharge today on prior home regimen.   Goal of Therapy:  INR 2-3 Monitor platelets by anticoagulation protocol: Yes   Plan:  -Warfarin 7.5mg  Mon/Wed/Fri, 5mg  all other days per discharge orders -Would check INR next week  Fredonia HighlandMichael Tasia Liz, PharmD, BCPS Clinical Pharmacist [phone] Please check AMION for all [facility] numbers 10/12/2018

## 2018-10-12 NOTE — Clinical Social Work Placement (Signed)
   CLINICAL SOCIAL WORK PLACEMENT  NOTE  Date:  10/12/2018  Patient Details  Name: Joanna [name] MRN: [id number] Date of Birth: 1941-01-31  Clinical Social Work is seeking post-discharge placement for this patient at the Skilled  Nursing Facility level of care (*CSW will initial, date and re-position this form in  chart as items are completed):  Yes   Patient/family provided with [facility] Clinical Social Work Department's list of facilities offering this level of care within the geographic area requested by the patient (or if unable, by the patient's family).  Yes   Patient/family informed of their freedom to choose among providers that offer the needed level of care, that participate in Medicare, Medicaid or managed care program needed by the patient, have an available bed and are willing to accept the patient.  Yes   Patient/family informed of [facility]'s ownership interest in [facility] and [facility], as well as of the fact that they are under no obligation to receive care at these facilities.  PASRR submitted to EDS on       PASRR number received on       Existing PASRR number confirmed on 10/12/18     FL2 transmitted to all facilities in geographic area requested by pt/family on 10/09/18     FL2 transmitted to all facilities within larger geographic area on       Patient informed that his/her managed care company has contracts with or will negotiate with certain facilities, including the following:        Yes   Patient/family informed of bed offers received.  [facility]     Physician recommends and patient chooses bed at      [facility]  Patient to be transferred to facility by [facility]     Patient family notified on 10/12/18 of transfer.  Name of family member notified:  Daughter, [name]     PHYSICIAN       Additional Comment:     _______________________________________________ Mearl LatinNadia S Berkleigh Beckles, LCSW 10/12/2018, 12:16 PM

## 2018-10-12 NOTE — Discharge Summary (Signed)
PATIENT DETAILS Name: Joanna [name] Age: 77 y.o. Sex: female Date of Birth: 11-27-1940 MRN: [id number]. Admitting Physician: Haydee Monica, MD ZDG:LOVFIE, Lanora Manis, MD  Admit Date: 10/06/2018 Discharge date: 10/12/2018  Recommendations for Outpatient Follow-up:  1. Follow up with PCP in 1-2 weeks 2. Please obtain BMP/CBC in one week 3. Recheck INR in the next 2-3 days 4. Please ensure follow up with neurology and cardiology  Admitted From:  Home  Disposition: SNF   Home Health: No  Equipment/Devices: None  Discharge Condition: Stable  CODE STATUS: FULL CODE  Diet recommendation:  Heart Healthy  Brief Summary: See H&P, Labs, Consult and Test reports for all details in brief, Patient is a 77 y.o. female with prior history of CVA with chronic right-sided hemiparesis, hypertension, chronic atrial fibrillation, chronic diastolic heart failure-presented to the ED with concerns about worsening right-sided hemiparesis and a URI symptoms, upon further evaluation thought to have recrudescence of previous stroke due to ongoing bronchitis.  See below for further details  Brief Hospital Course: Recrudescence of prior stroke due to ongoing bronchitis: Underwent extensive work-up including MRI brain, CTA head and neck without any acute CVA.  She is back to her usual baseline.  Continue Coumadin.  No further recommendations from stroke team.Please ensure follow up with Neurology  Chronic diastolic heart failure: Compensated-continue beta-blocker.  Chronic atrial fibrillation: INR therapeutic-rate controlled with Toprol and Cardizem.  Continue Coumadin at prior dose.Please recheck INR in the next 2-3 days  Generalized weakness/debility/high risk of falls: Due to acute illness-suspect has some amount of debility at baseline-evaluated by PT-recommendations are for SNF.  Procedures/Studies: None  Discharge Diagnoses:  Principal Problem:   TIA (transient ischemic  attack) Active Problems:   Encounter for therapeutic drug monitoring   Chronic atrial fibrillation   Chronic kidney disease   Hypertension   CHF (congestive heart failure) (HCC)   Discharge Instructions:  Activity:  As tolerated with Full fall precautions use walker/cane & assistance as needed   Discharge Instructions    Diet - low sodium heart healthy   Complete by:  As directed    Increase activity slowly   Complete by:  As directed      Allergies as of 10/12/2018   No Known Allergies     Medication List    STOP taking these medications   metoprolol tartrate 100 MG tablet Commonly known as:  LOPRESSOR     TAKE these medications   diltiazem 120 MG 24 hr capsule Commonly known as:  CARDIZEM CD Take 1 capsule (120 mg total) by mouth daily.   furosemide 40 MG tablet Commonly known as:  LASIX Take 1 tablet (40 mg total) by mouth every other day. Take 40 mg daily for 4 days, then take 40 mg every other day. What changed:    when to take this  additional instructions   metoprolol succinate 50 MG 24 hr tablet Commonly known as:  TOPROL-XL Take 1 tablet (50 mg total) by mouth daily.   warfarin 5 MG tablet Commonly known as:  COUMADIN Take as directed. If you are unsure how to take this medication, talk to your nurse or doctor. Original instructions:  TAKE 1 TO 1 AND 1/2 TABLETS BY MOUTH ONCE DAILY AS DIRECTED BY  COUMADIN  CLINIC What changed:    how much to take  how to take this  when to take this  additional instructions       Contact information for follow-up providers    [facility]. Schedule an appointment as soon as possible for a visit in 4 week(s).   Specialty:  Neurology Contact information: [address] [place] [address] [phone]       Juluis Rainier, MD. Schedule an appointment as soon as possible for a visit in 1 week(s).   Specialty:  Family Medicine Contact information: [address] [place] [address] [phone]        [place], Peter M, MD. Schedule an appointment as soon as possible for a visit in 2 week(s).   Specialty:  Cardiology Contact information: [address] [place] [address] [phone]            Contact information for after-discharge care    Destination    [facility] .   Service:  Skilled Nursing Contact information: [address] [place] [address] [phone]                 No Known Allergies  Consultations:   neurology  Other Procedures/Studies: Ct Angio Head W Or Wo Contrast  Result Date: 10/06/2018 CLINICAL DATA:  Generalized weakness for 4 days. Assess TIA. History of stroke, hypertension and atrial fibrillation. EXAM: CT ANGIOGRAPHY HEAD AND NECK TECHNIQUE: Multidetector CT imaging of the head and neck was performed using the standard protocol during bolus administration of intravenous contrast. Multiplanar CT image reconstructions and MIPs were obtained to evaluate the vascular anatomy. Carotid stenosis measurements (when applicable) are obtained utilizing NASCET criteria, using the distal internal carotid diameter as the denominator. CONTRAST:  ISOVUE-370 IOPAMIDOL (ISOVUE-370) INJECTION 76% COMPARISON:  CT HEAD October 06, 2018 and MRI/MRA head November 27, 2017. FINDINGS: CTA NECK FINDINGS: AORTIC ARCH: Thoracic aorta is normal in course and caliber. Average RIGHT subclavian artery coursing posterior to trachea and esophagus. Moderate to severe calcific atherosclerosis arch vessel origins. Arch vessel origins are patent. RIGHT CAROTID SYSTEM: Common carotid artery is patent, moderate calcific atherosclerosis. Calcific atherosclerosis resulting in less than 50% stenosis by NASCET criteria. Patent internal carotid artery. LEFT CAROTID SYSTEM: Common carotid artery is patent, moderate calcific atherosclerosis. Calcific atherosclerosis resulting in less than  50% stenosis by NASCET criteria. Patent internal carotid artery. VERTEBRAL ARTERIES:Left vertebral artery is dominant. Moderate stenosis LEFT vertebral artery origin. Patent vertebral arteries with mild extrinsic compression due to degenerative change. SKELETON: No acute osseous process though bone windows have not been submitted. Severe bilateral C3-4, RIGHT C4-5 neural foraminal narrowing. OTHER NECK: Soft tissues of the neck are nonacute though, not tailored for evaluation. UPPER CHEST: Included lung apices are clear. Cardiomegaly. Abnormal enhancement anterior to the superior vena cava and ascending aortic, appearing vascular. CTA HEAD FINDINGS: ANTERIOR CIRCULATION: Patent cervical internal carotid arteries, petrous, cavernous and supra clinoid internal carotid arteries. Calcific atherosclerosis resulting in moderate stenosis RIGHT carotid siphon. Patent anterior communicating artery. Patent anterior and middle cerebral arteries, moderate luminal irregularity compatible with atherosclerosis. Severe stenosis LEFT A2 segment with transient suspected occlusion. Severe stenosis RIGHT M3 segment. No large vessel occlusion, contrast extravasation or aneurysm. POSTERIOR CIRCULATION: Patent vertebral arteries, vertebrobasilar junction and basilar artery, as well as main branch vessels. Patent posterior cerebral arteries, moderate luminal irregularity compatible with atherosclerosis. No large vessel occlusion, significant stenosis, contrast extravasation or aneurysm. VENOUS SINUSES: Major dural venous sinuses are patent though not tailored for evaluation on this angiographic examination. ANATOMIC VARIANTS: None. DELAYED PHASE: No abnormal intracranial enhancement. MIP images reviewed. IMPRESSION: CTA NECK: 1. Less than 50% stenosis bilateral ICA's. 2. Patent vertebral arteries,  moderate stenosis LEFT V1 origin. 3. Abnormal anterior mediastinal vascular structure incompletely imaged, this could reflect enlarged RIGHT  atrium or aneurysm. Recommend CT angiogram chest. 4. Severe C3-4 and C4-5 neural foraminal narrowing. CTA HEAD: 1. No emergent large vessel occlusion. 2. Severe LEFT A2 and RIGHT M3 stenosis. 3. Moderate intracranial atherosclerosis. Electronically Signed   By: Awilda Metro M.D.   On: 10/06/2018 21:54   Dg Chest 2 View  Result Date: 10/06/2018 CLINICAL DATA:  Weakness starting 4 days prior to imaging. EXAM: CHEST - 2 VIEW COMPARISON:  10/26/2014 FINDINGS: Prominent enlargement of the cardiopericardial silhouette, similar to prior. The right heart is particularly enlarged. Atherosclerotic calcification of the aortic arch. Bandlike density in the right mid lung likely from atelectasis or scarring. The lungs appear otherwise clear. Mild thoracic spondylosis. No blunting of the costophrenic angles. Reduced right acromial humeral space implies potential rotator cuff tear. IMPRESSION: 1. Prominent but chronic enlargement of the cardiopericardial silhouette, without edema. 2. Right mid lung atelectasis or scarring. 3.  Aortic Atherosclerosis (ICD10-I70.0). 4. Thoracic spondylosis. Electronically Signed   By: Gaylyn Rong M.D.   On: 10/06/2018 16:39   Ct Head Wo Contrast  Result Date: 10/06/2018 CLINICAL DATA:  Weakness over the last 4 days EXAM: CT HEAD WITHOUT CONTRAST TECHNIQUE: Contiguous axial images were obtained from the base of the skull through the vertex without intravenous contrast. COMPARISON:  11/27/2016 MRI and CT scans FINDINGS: Brain: Periventricular white matter and corona radiata hypodensities favor chronic ischemic microvascular white matter disease. Remote left frontal lacunar infarct. Faint calcification of the lentiform nuclei, chronic. Otherwise, the brainstem, cerebellum, cerebral peduncles, thalami, basal ganglia, basilar cisterns, and ventricular system appear within normal limits. No intracranial hemorrhage, mass lesion, or acute CVA. Vascular: Atherosclerotic calcification of  the cavernous segment of the right internal carotid artery. Skull: Hyperostosis frontalis interna. Sinuses/Orbits: Considerable chronic maxillary and ethmoid sinusitis. Acute on chronic right sphenoid sinusitis. Other: No supplemental non-categorized findings. IMPRESSION: 1. Acute on chronic right sphenoid sinusitis. Chronic paranasal sinusitis. 2. No acute intracranial findings. 3. Periventricular white matter and corona radiata hypodensities favor chronic ischemic microvascular white matter disease. Old left frontal corona radiata lacunar infarct. Electronically Signed   By: Gaylyn Rong M.D.   On: 10/06/2018 16:43   Ct Angio Neck W Or Wo Contrast  Result Date: 10/06/2018 CLINICAL DATA:  Generalized weakness for 4 days. Assess TIA. History of stroke, hypertension and atrial fibrillation. EXAM: CT ANGIOGRAPHY HEAD AND NECK TECHNIQUE: Multidetector CT imaging of the head and neck was performed using the standard protocol during bolus administration of intravenous contrast. Multiplanar CT image reconstructions and MIPs were obtained to evaluate the vascular anatomy. Carotid stenosis measurements (when applicable) are obtained utilizing NASCET criteria, using the distal internal carotid diameter as the denominator. CONTRAST:  ISOVUE-370 IOPAMIDOL (ISOVUE-370) INJECTION 76% COMPARISON:  CT HEAD October 06, 2018 and MRI/MRA head November 27, 2017. FINDINGS: CTA NECK FINDINGS: AORTIC ARCH: Thoracic aorta is normal in course and caliber. Average RIGHT subclavian artery coursing posterior to trachea and esophagus. Moderate to severe calcific atherosclerosis arch vessel origins. Arch vessel origins are patent. RIGHT CAROTID SYSTEM: Common carotid artery is patent, moderate calcific atherosclerosis. Calcific atherosclerosis resulting in less than 50% stenosis by NASCET criteria. Patent internal carotid artery. LEFT CAROTID SYSTEM: Common carotid artery is patent, moderate calcific atherosclerosis. Calcific  atherosclerosis resulting in less than 50% stenosis by NASCET criteria. Patent internal carotid artery. VERTEBRAL ARTERIES:Left vertebral artery is dominant. Moderate stenosis LEFT vertebral artery origin. Patent vertebral  arteries with mild extrinsic compression due to degenerative change. SKELETON: No acute osseous process though bone windows have not been submitted. Severe bilateral C3-4, RIGHT C4-5 neural foraminal narrowing. OTHER NECK: Soft tissues of the neck are nonacute though, not tailored for evaluation. UPPER CHEST: Included lung apices are clear. Cardiomegaly. Abnormal enhancement anterior to the superior vena cava and ascending aortic, appearing vascular. CTA HEAD FINDINGS: ANTERIOR CIRCULATION: Patent cervical internal carotid arteries, petrous, cavernous and supra clinoid internal carotid arteries. Calcific atherosclerosis resulting in moderate stenosis RIGHT carotid siphon. Patent anterior communicating artery. Patent anterior and middle cerebral arteries, moderate luminal irregularity compatible with atherosclerosis. Severe stenosis LEFT A2 segment with transient suspected occlusion. Severe stenosis RIGHT M3 segment. No large vessel occlusion, contrast extravasation or aneurysm. POSTERIOR CIRCULATION: Patent vertebral arteries, vertebrobasilar junction and basilar artery, as well as main branch vessels. Patent posterior cerebral arteries, moderate luminal irregularity compatible with atherosclerosis. No large vessel occlusion, significant stenosis, contrast extravasation or aneurysm. VENOUS SINUSES: Major dural venous sinuses are patent though not tailored for evaluation on this angiographic examination. ANATOMIC VARIANTS: None. DELAYED PHASE: No abnormal intracranial enhancement. MIP images reviewed. IMPRESSION: CTA NECK: 1. Less than 50% stenosis bilateral ICA's. 2. Patent vertebral arteries, moderate stenosis LEFT V1 origin. 3. Abnormal anterior mediastinal vascular structure incompletely  imaged, this could reflect enlarged RIGHT atrium or aneurysm. Recommend CT angiogram chest. 4. Severe C3-4 and C4-5 neural foraminal narrowing. CTA HEAD: 1. No emergent large vessel occlusion. 2. Severe LEFT A2 and RIGHT M3 stenosis. 3. Moderate intracranial atherosclerosis. Electronically Signed   By: Courtnay  BloomAwilda Metroer M.D.   On: 10/06/2018 21:54   Ct Angio Chest Pe W Or Wo Contrast  Result Date: 10/07/2018 CLINICAL DATA:  Worsening dyspnea. Abnormal vascular structure in superior mediastinum on recent neck CTA. EXAM: CT ANGIOGRAPHY CHEST WITH CONTRAST TECHNIQUE: Multidetector CT imaging of the chest was performed using the standard protocol during bolus administration of intravenous contrast. Multiplanar CT image reconstructions and MIPs were obtained to evaluate the vascular anatomy. CONTRAST:  75mL ISOVUE-370 IOPAMIDOL (ISOVUE-370) INJECTION 76% COMPARISON:  Neck CTA on 10/06/2018; no prior chest CT FINDINGS: Cardiovascular: Marked cardiomegaly is seen with severe right atrial enlargement. Dilated right atrial appendage corresponds to the mediastinal abnormality seen on recent neck CTA. Aberrant origin of right subclavian artery incidentally noted. Satisfactory opacification of pulmonary arteries noted, and no pulmonary emboli identified. No evidence of thoracic aorticaneurysm. Aortic and coronary arterial atherosclerosis. Mediastinum/Nodes: No masses or pathologically enlarged lymph nodes identified. Lungs/Pleura: No pulmonary mass, infiltrate, or effusion. Upper abdomen: Distension and contrast opacification of IVC and hepatic veins is consistent with right heart insufficiency. Musculoskeletal: No suspicious bone lesions identified. Review of the MIP images confirms the above findings. IMPRESSION: No evidence of pulmonary embolism or other acute findings. Marked cardiomegaly with severe right atrial enlargement, consistent with tricuspid insufficiency. Distention of IVC and hepatic veins is also  consistent with right heart insufficiency. Incidentally noted aberrant origin of right subclavian artery. Aortic and coronary arterial atherosclerosis. Electronically Signed   By: Myles RosenthalJohn  Stahl M.D.   On: 10/07/2018 21:24   Mr Brain Wo Contrast  Result Date: 10/07/2018 CLINICAL DATA:  Ataxia. EXAM: MRI HEAD WITHOUT CONTRAST TECHNIQUE: Multiplanar, multiecho pulse sequences of the brain and surrounding structures were obtained without intravenous contrast. COMPARISON:  CTA head neck 10/06/2018 FINDINGS: BRAIN: There is no acute infarct, acute hemorrhage, hydrocephalus or extra-axial collection. The midline structures are normal. No midline shift or other mass effect. Old, small left frontal infarct. Multifocal white matter hyperintensity,  most commonly due to chronic ischemic microangiopathy. The cerebral and cerebellar volume are age-appropriate. Susceptibility-sensitive sequences show no chronic microhemorrhage or superficial siderosis. VASCULAR: Major intracranial arterial and venous sinus flow voids are normal. SKULL AND UPPER CERVICAL SPINE: Calvarial bone marrow signal is normal. There is no skull base mass. Visualized upper cervical spine and soft tissues are normal. SINUSES/ORBITS: Moderate-to-severe bilateral maxillary and ethmoid sinus mucosal thickening. Mild right sphenoid mucosal thickening. No mastoid or middle ear effusion. The orbits are normal. IMPRESSION: 1. No acute intracranial abnormality. 2. Chronic small vessel ischemia and old left frontal infarct. 3. Moderate paranasal sinus disease. Electronically Signed   By: Deatra Robinson M.D.   On: 10/07/2018 05:25      TODAY-DAY OF DISCHARGE:  Subjective:   Hava Massingale today has no headache,no chest abdominal pain,no new weakness tingling or numbness, feels much better wants to go home today.  Objective:   Blood pressure 134/88, pulse 87, temperature 98.6 F (37 C), temperature source Oral, resp. rate 20, height 5\' 3"  (1.6 m), weight  73.1 kg, SpO2 95 %.  Intake/Output Summary (Last 24 hours) at 10/12/2018 0840 Last data filed at 10/11/2018 1700 Gross per 24 hour  Intake 800 ml  Output 600 ml  Net 200 ml   Filed Weights   10/06/18 1348 10/06/18 2147  Weight: 66.7 kg 73.1 kg    Exam: Awake Alert, Oriented *3, No new F.N deficits, Normal affect [place].AT,PERRAL Supple Neck,No JVD, No cervical lymphadenopathy appriciated.  Symmetrical Chest wall movement, Good air movement bilaterally, CTAB RRR,No Gallops,Rubs or new Murmurs, No Parasternal Heave +ve B.Sounds, Abd Soft, Non tender, No organomegaly appriciated, No rebound -guarding or rigidity. No Cyanosis, Clubbing or edema, No new Rash or bruise   PERTINENT RADIOLOGIC STUDIES: Ct Angio Head W Or Wo Contrast  Result Date: 10/06/2018 CLINICAL DATA:  Generalized weakness for 4 days. Assess TIA. History of stroke, hypertension and atrial fibrillation. EXAM: CT ANGIOGRAPHY HEAD AND NECK TECHNIQUE: Multidetector CT imaging of the head and neck was performed using the standard protocol during bolus administration of intravenous contrast. Multiplanar CT image reconstructions and MIPs were obtained to evaluate the vascular anatomy. Carotid stenosis measurements (when applicable) are obtained utilizing NASCET criteria, using the distal internal carotid diameter as the denominator. CONTRAST:  ISOVUE-370 IOPAMIDOL (ISOVUE-370) INJECTION 76% COMPARISON:  CT HEAD October 06, 2018 and MRI/MRA head November 27, 2017. FINDINGS: CTA NECK FINDINGS: AORTIC ARCH: Thoracic aorta is normal in course and caliber. Average RIGHT subclavian artery coursing posterior to trachea and esophagus. Moderate to severe calcific atherosclerosis arch vessel origins. Arch vessel origins are patent. RIGHT CAROTID SYSTEM: Common carotid artery is patent, moderate calcific atherosclerosis. Calcific atherosclerosis resulting in less than 50% stenosis by NASCET criteria. Patent internal carotid artery. LEFT  CAROTID SYSTEM: Common carotid artery is patent, moderate calcific atherosclerosis. Calcific atherosclerosis resulting in less than 50% stenosis by NASCET criteria. Patent internal carotid artery. VERTEBRAL ARTERIES:Left vertebral artery is dominant. Moderate stenosis LEFT vertebral artery origin. Patent vertebral arteries with mild extrinsic compression due to degenerative change. SKELETON: No acute osseous process though bone windows have not been submitted. Severe bilateral C3-4, RIGHT C4-5 neural foraminal narrowing. OTHER NECK: Soft tissues of the neck are nonacute though, not tailored for evaluation. UPPER CHEST: Included lung apices are clear. Cardiomegaly. Abnormal enhancement anterior to the superior vena cava and ascending aortic, appearing vascular. CTA HEAD FINDINGS: ANTERIOR CIRCULATION: Patent cervical internal carotid arteries, petrous, cavernous and supra clinoid internal carotid arteries. Calcific atherosclerosis resulting in moderate  stenosis RIGHT carotid siphon. Patent anterior communicating artery. Patent anterior and middle cerebral arteries, moderate luminal irregularity compatible with atherosclerosis. Severe stenosis LEFT A2 segment with transient suspected occlusion. Severe stenosis RIGHT M3 segment. No large vessel occlusion, contrast extravasation or aneurysm. POSTERIOR CIRCULATION: Patent vertebral arteries, vertebrobasilar junction and basilar artery, as well as main branch vessels. Patent posterior cerebral arteries, moderate luminal irregularity compatible with atherosclerosis. No large vessel occlusion, significant stenosis, contrast extravasation or aneurysm. VENOUS SINUSES: Major dural venous sinuses are patent though not tailored for evaluation on this angiographic examination. ANATOMIC VARIANTS: None. DELAYED PHASE: No abnormal intracranial enhancement. MIP images reviewed. IMPRESSION: CTA NECK: 1. Less than 50% stenosis bilateral ICA's. 2. Patent vertebral arteries, moderate  stenosis LEFT V1 origin. 3. Abnormal anterior mediastinal vascular structure incompletely imaged, this could reflect enlarged RIGHT atrium or aneurysm. Recommend CT angiogram chest. 4. Severe C3-4 and C4-5 neural foraminal narrowing. CTA HEAD: 1. No emergent large vessel occlusion. 2. Severe LEFT A2 and RIGHT M3 stenosis. 3. Moderate intracranial atherosclerosis. Electronically Signed   By: Awilda Metro M.D.   On: 10/06/2018 21:54   Dg Chest 2 View  Result Date: 10/06/2018 CLINICAL DATA:  Weakness starting 4 days prior to imaging. EXAM: CHEST - 2 VIEW COMPARISON:  10/26/2014 FINDINGS: Prominent enlargement of the cardiopericardial silhouette, similar to prior. The right heart is particularly enlarged. Atherosclerotic calcification of the aortic arch. Bandlike density in the right mid lung likely from atelectasis or scarring. The lungs appear otherwise clear. Mild thoracic spondylosis. No blunting of the costophrenic angles. Reduced right acromial humeral space implies potential rotator cuff tear. IMPRESSION: 1. Prominent but chronic enlargement of the cardiopericardial silhouette, without edema. 2. Right mid lung atelectasis or scarring. 3.  Aortic Atherosclerosis (ICD10-I70.0). 4. Thoracic spondylosis. Electronically Signed   By: Gaylyn Rong M.D.   On: 10/06/2018 16:39   Ct Head Wo Contrast  Result Date: 10/06/2018 CLINICAL DATA:  Weakness over the last 4 days EXAM: CT HEAD WITHOUT CONTRAST TECHNIQUE: Contiguous axial images were obtained from the base of the skull through the vertex without intravenous contrast. COMPARISON:  11/27/2016 MRI and CT scans FINDINGS: Brain: Periventricular white matter and corona radiata hypodensities favor chronic ischemic microvascular white matter disease. Remote left frontal lacunar infarct. Faint calcification of the lentiform nuclei, chronic. Otherwise, the brainstem, cerebellum, cerebral peduncles, thalami, basal ganglia, basilar cisterns, and ventricular  system appear within normal limits. No intracranial hemorrhage, mass lesion, or acute CVA. Vascular: Atherosclerotic calcification of the cavernous segment of the right internal carotid artery. Skull: Hyperostosis frontalis interna. Sinuses/Orbits: Considerable chronic maxillary and ethmoid sinusitis. Acute on chronic right sphenoid sinusitis. Other: No supplemental non-categorized findings. IMPRESSION: 1. Acute on chronic right sphenoid sinusitis. Chronic paranasal sinusitis. 2. No acute intracranial findings. 3. Periventricular white matter and corona radiata hypodensities favor chronic ischemic microvascular white matter disease. Old left frontal corona radiata lacunar infarct. Electronically Signed   By: Gaylyn Rong M.D.   On: 10/06/2018 16:43   Ct Angio Neck W Or Wo Contrast  Result Date: 10/06/2018 CLINICAL DATA:  Generalized weakness for 4 days. Assess TIA. History of stroke, hypertension and atrial fibrillation. EXAM: CT ANGIOGRAPHY HEAD AND NECK TECHNIQUE: Multidetector CT imaging of the head and neck was performed using the standard protocol during bolus administration of intravenous contrast. Multiplanar CT image reconstructions and MIPs were obtained to evaluate the vascular anatomy. Carotid stenosis measurements (when applicable) are obtained utilizing NASCET criteria, using the distal internal carotid diameter as the denominator. CONTRAST:  ISOVUE-370  IOPAMIDOL (ISOVUE-370) INJECTION 76% COMPARISON:  CT HEAD October 06, 2018 and MRI/MRA head November 27, 2017. FINDINGS: CTA NECK FINDINGS: AORTIC ARCH: Thoracic aorta is normal in course and caliber. Average RIGHT subclavian artery coursing posterior to trachea and esophagus. Moderate to severe calcific atherosclerosis arch vessel origins. Arch vessel origins are patent. RIGHT CAROTID SYSTEM: Common carotid artery is patent, moderate calcific atherosclerosis. Calcific atherosclerosis resulting in less than 50% stenosis by NASCET  criteria. Patent internal carotid artery. LEFT CAROTID SYSTEM: Common carotid artery is patent, moderate calcific atherosclerosis. Calcific atherosclerosis resulting in less than 50% stenosis by NASCET criteria. Patent internal carotid artery. VERTEBRAL ARTERIES:Left vertebral artery is dominant. Moderate stenosis LEFT vertebral artery origin. Patent vertebral arteries with mild extrinsic compression due to degenerative change. SKELETON: No acute osseous process though bone windows have not been submitted. Severe bilateral C3-4, RIGHT C4-5 neural foraminal narrowing. OTHER NECK: Soft tissues of the neck are nonacute though, not tailored for evaluation. UPPER CHEST: Included lung apices are clear. Cardiomegaly. Abnormal enhancement anterior to the superior vena cava and ascending aortic, appearing vascular. CTA HEAD FINDINGS: ANTERIOR CIRCULATION: Patent cervical internal carotid arteries, petrous, cavernous and supra clinoid internal carotid arteries. Calcific atherosclerosis resulting in moderate stenosis RIGHT carotid siphon. Patent anterior communicating artery. Patent anterior and middle cerebral arteries, moderate luminal irregularity compatible with atherosclerosis. Severe stenosis LEFT A2 segment with transient suspected occlusion. Severe stenosis RIGHT M3 segment. No large vessel occlusion, contrast extravasation or aneurysm. POSTERIOR CIRCULATION: Patent vertebral arteries, vertebrobasilar junction and basilar artery, as well as main branch vessels. Patent posterior cerebral arteries, moderate luminal irregularity compatible with atherosclerosis. No large vessel occlusion, significant stenosis, contrast extravasation or aneurysm. VENOUS SINUSES: Major dural venous sinuses are patent though not tailored for evaluation on this angiographic examination. ANATOMIC VARIANTS: None. DELAYED PHASE: No abnormal intracranial enhancement. MIP images reviewed. IMPRESSION: CTA NECK: 1. Less than 50% stenosis bilateral  ICA's. 2. Patent vertebral arteries, moderate stenosis LEFT V1 origin. 3. Abnormal anterior mediastinal vascular structure incompletely imaged, this could reflect enlarged RIGHT atrium or aneurysm. Recommend CT angiogram chest. 4. Severe C3-4 and C4-5 neural foraminal narrowing. CTA HEAD: 1. No emergent large vessel occlusion. 2. Severe LEFT A2 and RIGHT M3 stenosis. 3. Moderate intracranial atherosclerosis. Electronically Signed   By: Awilda Metro M.D.   On: 10/06/2018 21:54   Ct Angio Chest Pe W Or Wo Contrast  Result Date: 10/07/2018 CLINICAL DATA:  Worsening dyspnea. Abnormal vascular structure in superior mediastinum on recent neck CTA. EXAM: CT ANGIOGRAPHY CHEST WITH CONTRAST TECHNIQUE: Multidetector CT imaging of the chest was performed using the standard protocol during bolus administration of intravenous contrast. Multiplanar CT image reconstructions and MIPs were obtained to evaluate the vascular anatomy. CONTRAST:  75mL ISOVUE-370 IOPAMIDOL (ISOVUE-370) INJECTION 76% COMPARISON:  Neck CTA on 10/06/2018; no prior chest CT FINDINGS: Cardiovascular: Marked cardiomegaly is seen with severe right atrial enlargement. Dilated right atrial appendage corresponds to the mediastinal abnormality seen on recent neck CTA. Aberrant origin of right subclavian artery incidentally noted. Satisfactory opacification of pulmonary arteries noted, and no pulmonary emboli identified. No evidence of thoracic aorticaneurysm. Aortic and coronary arterial atherosclerosis. Mediastinum/Nodes: No masses or pathologically enlarged lymph nodes identified. Lungs/Pleura: No pulmonary mass, infiltrate, or effusion. Upper abdomen: Distension and contrast opacification of IVC and hepatic veins is consistent with right heart insufficiency. Musculoskeletal: No suspicious bone lesions identified. Review of the MIP images confirms the above findings. IMPRESSION: No evidence of pulmonary embolism or other acute findings. Marked  cardiomegaly with severe right  atrial enlargement, consistent with tricuspid insufficiency. Distention of IVC and hepatic veins is also consistent with right heart insufficiency. Incidentally noted aberrant origin of right subclavian artery. Aortic and coronary arterial atherosclerosis. Electronically Signed   By: Myles Rosenthal M.D.   On: 10/07/2018 21:24   Mr Brain Wo Contrast  Result Date: 10/07/2018 CLINICAL DATA:  Ataxia. EXAM: MRI HEAD WITHOUT CONTRAST TECHNIQUE: Multiplanar, multiecho pulse sequences of the brain and surrounding structures were obtained without intravenous contrast. COMPARISON:  CTA head neck 10/06/2018 FINDINGS: BRAIN: There is no acute infarct, acute hemorrhage, hydrocephalus or extra-axial collection. The midline structures are normal. No midline shift or other mass effect. Old, small left frontal infarct. Multifocal white matter hyperintensity, most commonly due to chronic ischemic microangiopathy. The cerebral and cerebellar volume are age-appropriate. Susceptibility-sensitive sequences show no chronic microhemorrhage or superficial siderosis. VASCULAR: Major intracranial arterial and venous sinus flow voids are normal. SKULL AND UPPER CERVICAL SPINE: Calvarial bone marrow signal is normal. There is no skull base mass. Visualized upper cervical spine and soft tissues are normal. SINUSES/ORBITS: Moderate-to-severe bilateral maxillary and ethmoid sinus mucosal thickening. Mild right sphenoid mucosal thickening. No mastoid or middle ear effusion. The orbits are normal. IMPRESSION: 1. No acute intracranial abnormality. 2. Chronic small vessel ischemia and old left frontal infarct. 3. Moderate paranasal sinus disease. Electronically Signed   By: Deatra Robinson M.D.   On: 10/07/2018 05:25     PERTINENT LAB RESULTS: CBC: Recent Labs    10/10/18 0636  WBC 4.9  HGB 13.0  HCT 40.7  PLT 166   CMET CMP     Component Value Date/Time   NA 137 10/10/2018 0636   NA 143 01/10/2018  1612   K 3.7 10/10/2018 0636   CL 104 10/10/2018 0636   CO2 27 10/10/2018 0636   GLUCOSE 85 10/10/2018 0636   BUN 10 10/10/2018 0636   BUN 15 01/10/2018 1612   CREATININE 0.88 10/10/2018 0636   CREATININE 1.18 (H) 05/06/2016 1232   CALCIUM 8.6 (L) 10/10/2018 0636   PROT 7.8 01/23/2018 1610   ALBUMIN 3.9 01/23/2018 1610   AST 22 01/23/2018 1610   ALT 9 01/23/2018 1610   ALKPHOS 202 (H) 01/23/2018 1610   BILITOT 1.2 01/23/2018 1610   GFRNONAA >60 10/10/2018 0636   GFRAA >60 10/10/2018 0636    GFR Estimated Creatinine Clearance: 50.5 mL/min (by C-G formula based on SCr of 0.88 mg/dL). No results for input(s): LIPASE, AMYLASE in the last 72 hours. No results for input(s): CKTOTAL, CKMB, CKMBINDEX, TROPONINI in the last 72 hours. Invalid input(s): POCBNP No results for input(s): DDIMER in the last 72 hours. No results for input(s): HGBA1C in the last 72 hours. No results for input(s): CHOL, HDL, LDLCALC, TRIG, CHOLHDL, LDLDIRECT in the last 72 hours. No results for input(s): TSH, T4TOTAL, T3FREE, THYROIDAB in the last 72 hours.  Invalid input(s): FREET3 No results for input(s): VITAMINB12, FOLATE, FERRITIN, TIBC, IRON, RETICCTPCT in the last 72 hours. Coags: Recent Labs    10/11/18 0252 10/12/18 0527  INR 2.77 2.79   Microbiology: Recent Results (from the past 240 hour(s))  Urine culture     Status: Abnormal   Collection Time: 10/06/18  5:59 PM  Result Value Ref Range Status   Specimen Description URINE, RANDOM  Final   Special Requests NONE  Final   Culture (A)  Final    <10,000 COLONIES/mL INSIGNIFICANT GROWTH Performed at [facility], [address], [place], [place] [address]    Report  Status 10/07/2018 FINAL  Final    FURTHER DISCHARGE INSTRUCTIONS:  Get Medicines reviewed and adjusted: Please take all your medications with you for your next visit with your Primary MD  Laboratory/radiological data: Please request your Primary MD to go over all  hospital tests and procedure/radiological results at the follow up, please ask your Primary MD to get all Hospital records sent to his/her office.  In some cases, they will be blood work, cultures and biopsy results pending at the time of your discharge. Please request that your primary care M.D. goes through all the records of your hospital data and follows up on these results.  Also Note the following: If you experience worsening of your admission symptoms, develop shortness of breath, life threatening emergency, suicidal or homicidal thoughts you must seek medical attention immediately by calling 911 or calling your MD immediately  if symptoms less severe.  You must read complete instructions/literature along with all the possible adverse reactions/side effects for all the Medicines you take and that have been prescribed to you. Take any new Medicines after you have completely understood and accpet all the possible adverse reactions/side effects.   Do not drive when taking Pain medications or sleeping medications (Benzodaizepines)  Do not take more than prescribed Pain, Sleep and Anxiety Medications. It is not advisable to combine anxiety,sleep and pain medications without talking with your primary care practitioner  Special Instructions: If you have smoked or chewed Tobacco  in the last 2 yrs please stop smoking, stop any regular Alcohol  and or any Recreational drug use.  Wear Seat belts while driving.  Please note: You were cared for by a hospitalist during your hospital stay. Once you are discharged, your primary care physician will handle any further medical issues. Please note that NO REFILLS for any discharge medications will be authorized once you are discharged, as it is imperative that you return to your primary care physician (or establish a relationship with a primary care physician if you do not have one) for your post hospital discharge needs so that they can reassess your need for  medications and monitor your lab values.  Total Time spent coordinating discharge including counseling, education and face to face time equals  45 minutes.  SignedJeoffrey Massed 10/12/2018 8:40 AM

## 2018-10-12 NOTE — Progress Notes (Signed)
Joanna [name] has Therapist, occupationalinsurance approval for patient to discharge today.   Osborne Cascoadia Zoanne Newill LCSW [phone]

## 2018-10-12 NOTE — Progress Notes (Signed)
Physical Therapy Treatment Patient Details Name: Joanna Riffleatricia A Pulis MRN: [id number] DOB: Jan 07, 1941 Today's Date: 10/12/2018    History of Present Illness Pt is a 77 y.o. female admitted 10/06/18 with R-side weakness. CT and MRI unremarkable for acute intracranial abnormality; old L frontal infarct. Concern for TIA. PMH includes a-fib (on Coumadin), CHF, CKD, stroke, aphasia.    PT Comments    A little less focused today, needing more cues for direction.  Emphasis on transitions to EOB, sit to stand, gait and pericare during toileting.   Follow Up Recommendations  SNF     Equipment Recommendations  Other (comment)    Recommendations for Other Services       Precautions / Restrictions Precautions Precautions: Fall    Mobility  Bed Mobility Overal bed mobility: Needs Assistance Bed Mobility: Supine to Sit Rolling: Min guard         General bed mobility comments: excessive amount of time with multiple cues to stay focused on the task.  Transfers Overall transfer level: Needs assistance Equipment used: Rolling walker (2 wheeled) Transfers: Sit to/from Stand Sit to Stand: Min assist            Ambulation/Gait Ambulation/Gait assistance: Min Chemical engineerassist Gait Distance (Feet): 160 Feet Assistive device: Rolling walker (2 wheeled) Gait Pattern/deviations: Step-through pattern Gait velocity: decreased Gait velocity interpretation: <1.31 ft/sec, indicative of household ambulator General Gait Details: slower and deliberate today.  Generally steady otherwise.   Stairs             Wheelchair Mobility    Modified Rankin (Stroke Patients Only) Modified Rankin (Stroke Patients Only) Modified Rankin: Moderately severe disability     Balance Overall balance assessment: Needs assistance Sitting-balance support: Feet supported;No upper extremity supported Sitting balance-Leahy Scale: Good     Standing balance support: No upper extremity supported Standing  balance-Leahy Scale: Poor                              Cognition Arousal/Alertness: Awake/alert Behavior During Therapy: WFL for tasks assessed/performed Overall Cognitive Status: No family/caregiver present to determine baseline cognitive functioning                     Current Attention Level: Selective Memory: Decreased short-term memory Following Commands: Follows one step commands consistently Safety/Judgement: Decreased awareness of deficits Awareness: Emergent Problem Solving: Slow processing        Exercises      General Comments General comments (skin integrity, edema, etc.): Emphasis on standing task including hygienge post toileting with RW abd washing hands at the sink.      Pertinent Vitals/Pain Pain Assessment: No/denies pain    Home Living                      Prior Function            PT Goals (current goals can now be found in the care plan section) Acute Rehab PT Goals Patient Stated Goal: to go home for Thanksgiving PT Goal Formulation: With patient Time For Goal Achievement: 10/21/18 Potential to Achieve Goals: Fair Progress towards PT goals: Progressing toward goals    Frequency    Min 3X/week      PT Plan Current plan remains appropriate    Co-evaluation              AM-PAC PT "6 Clicks" Mobility   Outcome Measure  Help needed turning from your  back to your side while in a flat bed without using bedrails?: A Little Help needed moving from lying on your back to sitting on the side of a flat bed without using bedrails?: A Little Help needed moving to and from a bed to a chair (including a wheelchair)?: A Little Help needed standing up from a chair using your arms (e.g., wheelchair or bedside chair)?: A Little Help needed to walk in hospital room?: A Little Help needed climbing 3-5 steps with a railing? : A Little 6 Click Score: 18    End of Session   Activity Tolerance: Patient tolerated  treatment well Patient left: with call bell/phone within reach;in chair;with chair alarm set Nurse Communication: Mobility status PT Visit Diagnosis: Other abnormalities of gait and mobility (R26.89);Difficulty in walking, not elsewhere classified (R26.2);Other symptoms and signs involving the nervous system (R29.898) Hemiplegia - Right/Left: Right Hemiplegia - dominant/non-dominant: Dominant Hemiplegia - caused by: Unspecified     Time: 1254-1330 PT Time Calculation (min) (ACUTE ONLY): 36 min  Charges:  $Gait Training: 8-22 mins $Therapeutic Activity: 8-22 mins                     10/12/2018  [place] Bing, PT Acute Rehabilitation Services [phone]  (pager) [phone]  (office)   Eliseo Gum Beauregard Jarrells 10/12/2018, 3:43 PM

## 2018-10-12 NOTE — Progress Notes (Addendum)
[facility], called at 1420 spoke with receptionist and call transferred to hall pt to go to--room 206, no answer . At 1424 attempted call again, and call transferred to different hall, again no answer. PT[facility] here to pick pt up, pt's daughter meeting pt there at facility. IV NSL removed at 1400, cath intact, site unremarkable.    [facility]  Copy of instructions printed and sent with transporters for facility.  Pt d/c'd with belongings, transported by PTAR.

## 2018-10-12 NOTE — Plan of Care (Signed)
Pt will continue plan of care at SNF. Pt d/c'd to facility today.

## 2018-10-12 NOTE — Progress Notes (Signed)
Patient will DC to: [facility] Anticipated DC date: 10/12/18 Family notified: Daughter, Public house managerTawana Transport by: PTAR 1:30pm  Per MD patient ready for [facility]. RN, patient, patient's family, and facility notified of DC. Discharge Summary and FL2 sent to facility. RN to call report prior to discharge [phone]). DC packet on chart. Ambulance transport requested for patient.   CSW will sign off for now as social work intervention is no longer needed. Please consult us again if new needs arise.  Cristobal GoldmannNadia Jezabel Lecker, LCSW Clinical Social Worker [phone]

## 2018-10-15 DIAGNOSIS — I480 Paroxysmal atrial fibrillation: Secondary | ICD-10-CM | POA: Diagnosis not present

## 2018-10-15 DIAGNOSIS — G458 Other transient cerebral ischemic attacks and related syndromes: Secondary | ICD-10-CM | POA: Diagnosis not present

## 2018-10-15 DIAGNOSIS — I69351 Hemiplegia and hemiparesis following cerebral infarction affecting right dominant side: Secondary | ICD-10-CM | POA: Diagnosis not present

## 2018-10-15 DIAGNOSIS — J208 Acute bronchitis due to other specified organisms: Secondary | ICD-10-CM | POA: Diagnosis not present

## 2018-10-17 DIAGNOSIS — Z7901 Long term (current) use of anticoagulants: Secondary | ICD-10-CM | POA: Diagnosis not present

## 2018-10-17 DIAGNOSIS — I480 Paroxysmal atrial fibrillation: Secondary | ICD-10-CM | POA: Diagnosis not present

## 2018-10-17 DIAGNOSIS — N950 Postmenopausal bleeding: Secondary | ICD-10-CM | POA: Diagnosis not present

## 2018-10-29 DIAGNOSIS — R5381 Other malaise: Secondary | ICD-10-CM | POA: Diagnosis not present

## 2018-10-29 DIAGNOSIS — I5032 Chronic diastolic (congestive) heart failure: Secondary | ICD-10-CM | POA: Diagnosis not present

## 2018-10-29 DIAGNOSIS — G458 Other transient cerebral ischemic attacks and related syndromes: Secondary | ICD-10-CM | POA: Diagnosis not present

## 2018-10-29 DIAGNOSIS — I480 Paroxysmal atrial fibrillation: Secondary | ICD-10-CM | POA: Diagnosis not present

## 2018-11-20 ENCOUNTER — Ambulatory Visit: Payer: Medicare HMO | Admitting: Nurse Practitioner

## 2018-11-20 ENCOUNTER — Encounter

## 2018-11-20 NOTE — Progress Notes (Deleted)
CARDIOLOGY OFFICE NOTE  Date:  11/20/2018    [name] Date of Birth: 06-15-1941 Medical Record #[id number]  PCP:  Juluis Rainier, MD  Cardiologist:  Tyrone Sage & ***    No chief complaint on file.   History of Present Illness: Joanna [name] is a 78 y.o. female who presents today for a ***  Former patient of Dr. Pryor Curia Jordan's.She now follows with me. Did notwish to transition to the [employer].   She has a past history of chronic permanent atrial fibrillation. She is a long-term warfarin. She's had a prior history of a middle cerebral artery CVA in 2012. She retired from work after that stroke. 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 Other issues include HTN, prior LV dysfunction.   Admitted back in [facility] 2016with recurrent stroke. Discharge summary reviewed - apparently did not wish to switch to DOAC. She remains on coumadin. Echo was updated again.Noted to beresistant to allowing family support.  I last saw her last January of 2018 - she was doing well.   Comes back today. Herealone today.She says she is doing well. No pain. No chest pain. Breathing is good. Sounds like she has not gone to see PCP "in some time". Her only issue is urinary frequency. Tolerating her coumadin. No falls. No bleeding or excessive bruising. She feels like she is doing ok overall. She has lost 14 pounds since I last saw her - she has not really been trying - says she eats less.       with a past medical history significant for permanent atrial fibrillation on warfarin, prior history of middle cerebral artery CVA 2012, recurrent stroke 2016, hypertension and prior LV dysfunction.  He was last seen by Norma Fredrickson on 01/10/2018 at which time she was doing well.  [name] did note that the patient had not seen a primary care provider in quite some time and the patient was encouraged to follow-up with her  PCP.  Joanna [name] is here with her daughter. She is complaining of lower extremity edema. She denies chest discomfort, dyspnea, orthopnea or PND. No palpitations or lightheadedness. She had lasix 40 mg QOD on her med list but states that she has not been taking it for a long time. The bottle that she brings in with her is dated 11/2016 and is full.  Her daughter says that she does not like to have to urinate so much. Her daughter also reports that in the past she stopped taking her coumadin thinking that she was taking too many medications and that is when she had a stroke. She says that she learned her lesson and is now compliant with the coumadin. She also admits that she uses a lot of salt.   Comes in today. Here with   Past Medical History:  Diagnosis Date  . Acute renal failure (HCC) 02/23/2007-03/22/2007   Now with normalization back to baseline  . Aphasia 2012  . CHF (congestive heart failure) (HCC)   . Chronic atrial fibrillation   . Chronic kidney disease   . Diverticulosis   . Dysnomia 12/2010  . Hypertension   . Ischemic bowel disease (HCC)    With GI bleeding  . Left ventricular dysfunction    EF is now 50-55% as of 2012  . Obesity   . Splenic infarct   . Stroke [facility]) February 2012   Now back on coumadin    Past Surgical  History:  Procedure Laterality Date  . COLECTOMY  12/2010   partial  . ILEOSTOMY    . OMENTECTOMY     partial with ileostomy     Medications: No outpatient medications have been marked as taking for the 11/20/18 encounter (Appointment) with Rosalio MacadamiaGerhardt, Dariel Pellecchia C, NP.     Allergies: No Known Allergies  Social History: The patient  reports that she has never smoked. She has never used smokeless tobacco. She reports that she does not drink alcohol or use drugs.   Family History: [name] ***family history includes Heart disease in her mother; Hypertension in her sister.   Review of Systems: Please see the history of present illness.   Otherwise,  the review of systems is positive for {NONE DEFAULTED:18576::"none"}.   All other systems are reviewed and negative.   Physical Exam: VS:  There were no vitals taken for this visit. Marland Kitchen.  BMI There is no height or weight on file to calculate BMI.  Wt Readings from Last 3 Encounters:  10/06/18 161 lb 2.5 oz (73.1 kg)  07/18/18 160 lb (72.6 kg)  01/10/18 174 lb 6.4 oz (79.1 kg)    General: Pleasant. Well developed, well nourished and in no acute distress.   HEENT: Normal.  Neck: Supple, no JVD, carotid bruits, or masses noted.  Cardiac: ***Regular rate and rhythm. No murmurs, rubs, or gallops. No edema.  Respiratory:  Lungs are clear to auscultation bilaterally with normal work of breathing.  GI: Soft and nontender.  MS: No deformity or atrophy. Gait and ROM intact.  Skin: Warm and dry. Color is normal.  Neuro:  Strength and sensation are intact and no gross focal deficits noted.  Psych: Alert, appropriate and with normal affect.   LABORATORY DATA:  EKG:  EKG {ACTION; IS/IS ZOX:[id number]}OT:[id number]} ordered today. This demonstrates ***.  Lab Results  Component Value Date   WBC 4.9 10/10/2018   HGB 13.0 10/10/2018   HCT 40.7 10/10/2018   PLT 166 10/10/2018   GLUCOSE 85 10/10/2018   CHOL 87 10/07/2018   TRIG 60 10/07/2018   HDL 22 (L) 10/07/2018   LDLCALC 53 10/07/2018   ALT 9 01/23/2018   AST 22 01/23/2018   NA 137 10/10/2018   K 3.7 10/10/2018   CL 104 10/10/2018   CREATININE 0.88 10/10/2018   BUN 10 10/10/2018   CO2 27 10/10/2018   TSH 4.630 (H) 01/23/2018   INR 2.79 10/12/2018   HGBA1C 5.5 10/07/2018     BNP (last 3 results) No results for input(s): BNP in the last 8760 hours.  ProBNP (last 3 results) No results for input(s): PROBNP in the last 8760 hours.   Other Studies Reviewed Today:   Assessment/Plan: Echo Study Conclusions from 10/2015  - Left ventricle: The cavity size was normal. Systolic function was  normal. The estimated ejection fraction was in the  range of 50%  to 55%. Wall motion was normal; there were no regional wall  motion abnormalities. - Aortic valve: Trileaflet; mildly thickened, mildly calcified  leaflets. - Mitral valve: Calcified annulus. There was mild to moderate  regurgitation. - Left atrium: The atrium was severely dilated. - Right ventricle: The cavity size was mildly dilated. Wall  thickness was normal. Systolic function was mildly reduced. - Right atrium: The atrium was massively dilated. - Tricuspid valve: There was severe regurgitation. - Pulmonary arteries: PA peak pressure: 56 mm Hg (S).  Impressions:  - The right ventricular systolic pressure was increased consistent  with moderate pulmonary hypertension.  Assessment/Plan: 1. Permanent atrial fibrillation - managed with rate control and anticoagulation. She is doing ok overall.   2.PriorCVA - refused switching to DOAC - she wishes to stay on coumadin. Will recheck her labs today. She is doing ok.   3. HTN - BPlooks great today - no changes made.   4. Prior left ventricular dysfunction but last recent echo showing ejection fraction of 50%. She has no symptoms.   5. Chronic combined systolic and diastolic heart failure - looks good clinically  6. Chronic anticoagulation- no problems noted.Rechecking her lab today  7. Weight loss - needs labs  8. Urinary frequency - will check UA - I suspect OAB - encouraged her to discuss with PCP.   1. Lower leg edema   2. Permanent atrial fibrillation (HCC)   3. History of stroke   4. Essential (primary) hypertension   5. Chronic combined systolic and diastolic heart failure (HCC)   6. Bilateral carotid artery stenosis    PLAN:    In order of problems listed above:  Lower extremity edema: No dyspnea, orthopnea or PND.  The patient was previously ordered Lasix 40 mg every other day, but she has not been taking it for probably at least a year.  I will restart her Lasix 40 mg every  other day but advised her to take 40 mg daily for 4 days to try to reduce her lower extremity edema.  Also advised on low-sodium diet, less than 2 g/day. Elevate legs. Use compression stockings- her daughter will get them.   Permanent atrial fibrillation: Rate controlled. Asymptomatic.  Anticoagulated with warfarin for stroke risk reduction  Prior CVA: Patient is on warfarin for stroke risk reduction having refused to switching to DOAC in the past.  Hypertension: BP well controlled.   Chronic combined systolic and diastolic heart failure: Most recent echocardiogram in 11/2016 showed mild LVH with EF 55-60% with severely dilated bilateral atria and moderate-severe TR, moderate PR  Carotid artery stenosis: Carotid duplex 11/28/2016 showed bilateral 1-39% stenosis.  Currently does not need intervention.  Follow-up with Norma Fredrickson, NP in 3-4 months                 Current medicines are reviewed with the patient today.  The patient does not have concerns regarding medicines other than what has been noted above.  The following changes have been made:  See above.  Labs/ tests ordered today include:   No orders of the defined types were placed in this encounter.    Disposition:   FU with *** in {gen number 9-62:[id number]} {Days to years:10300}.   Patient is agreeable to this plan and will call if any problems develop in the interim.   SignedNorma Fredrickson, NP  11/20/2018 7:23 AM  [facility] [address] [place], [place]  [address] Phone: [phone] Fax: [phone]

## 2018-11-21 ENCOUNTER — Encounter: Payer: Self-pay | Admitting: Nurse Practitioner

## 2018-11-22 NOTE — Progress Notes (Deleted)
[facility] [address] [place]. [place] [id number] [phone]       OFFICE FOLLOW UP NOTE  [name] Date of Birth:  10/01/1941 Medical Record Number:  [id number]   Reason for Referral:  hospital stroke follow up  CHIEF COMPLAINT:  No chief complaint on file.   HPI: Joanna [name] is being seen today for initial visit in the office for recrudescence of previous stroke due to ongoing bronchitis on 10/06/18. History obtained from *** and chart review. Reviewed all radiology images and labs personally.  [name] is a 78 y.o. female with history of left MCA territory strokes with unclear residual deficits-may be some mild word finding difficulty and right-sided weakness, CHF, CKD, chronic atrial fibrillation on Coumadin-missed 2 doses, hypertension   who presented with worsening right-sided weakness. . She did not receive IV t-PA due to late presentation and anticoagulation.  CT head reviewed and showed no acute intracranial abnormalities but did show chronic left frontal CR infarct.  MRI head reviewed and showed no acute intracranial normalities but showed chronic small vessel ischemia and old left frontal infarct.  CTA head and neck showed severe left A2 and right M3 stenosis with less than 50% stenosis bilateral ICAs.  2D echo showed an EF of ***.  LDL 53 and A1c 5.5.  Patient was previously on warfarin with INR 2.20 and it was recommended to continue with INR goal 2-3.  Determined as though her symptoms were likely recrudescence of previous strokes due to ongoing bronchitis.  Imaging also showed abnormal anterior mediastinal vasculature structure incompletely imaged which could have reflect enlarged right atrium or aneurysm and recommended CT angiogram chest.  As worsening right-sided weakness resolved and was back to baseline, patient was discharged home in stable condition.    ROS:   14 system review of systems performed and negative with  exception of ***  PMH:  Past Medical History:  Diagnosis Date  . Acute renal failure (HCC) 02/23/2007-03/22/2007   Now with normalization back to baseline  . Aphasia 2012  . CHF (congestive heart failure) (HCC)   . Chronic atrial fibrillation   . Chronic kidney disease   . Diverticulosis   . Dysnomia 12/2010  . Hypertension   . Ischemic bowel disease (HCC)    With GI bleeding  . Left ventricular dysfunction    EF is now 50-55% as of 2012  . Obesity   . Splenic infarct   . Stroke [facility](H[facility]) February 2012   Now back on coumadin    PSH:  Past Surgical History:  Procedure Laterality Date  . COLECTOMY  12/2010   partial  . ILEOSTOMY    . OMENTECTOMY     partial with ileostomy    Social History:  Social History   Socioeconomic History  . Marital status: Widowed    Spouse name: Not on file  . Number of children: 3  . Years of education: Not on file  . Highest education level: Not on file  Occupational History    Employer: RETIRED  Social Needs  . Financial resource strain: Not on file  . Food insecurity:    Worry: Not on file    Inability: Not on file  . Transportation needs:    Medical: Not on file    Non-medical: Not on file  Tobacco Use  . Smoking status: Never Smoker  . Smokeless tobacco: Never Used  Substance and Sexual Activity  . Alcohol use: No  . Drug  use: No  . Sexual activity: Not on file  Lifestyle  . Physical activity:    Days per week: Not on file    Minutes per session: Not on file  . Stress: Not on file  Relationships  . Social connections:    Talks on phone: Not on file    Gets together: Not on file    Attends religious service: Not on file    Active member of club or organization: Not on file    Attends meetings of clubs or organizations: Not on file    Relationship status: Not on file  . Intimate partner violence:    Fear of current or ex partner: Not on file    Emotionally abused: Not on file    Physically abused: Not on file    Forced  sexual activity: Not on file  Other Topics Concern  . Not on file  Social History Narrative  . Not on file    Family History:  Family History  Problem Relation Age of Onset  . Heart disease Mother   . Hypertension Sister     Medications:   Current Outpatient Medications on File Prior to Visit  Medication Sig Dispense Refill  . diltiazem (CARTIA XT) 120 MG 24 hr capsule Take 1 capsule (120 mg total) by mouth daily. 90 capsule 3  . furosemide (LASIX) 40 MG tablet Take 1 tablet (40 mg total) by mouth every other day. Take 40 mg daily for 4 days, then take 40 mg every other day. (Patient taking differently: Take 40 mg by mouth daily. ) 30 tablet 0  . metoprolol succinate (TOPROL-XL) 50 MG 24 hr tablet Take 1 tablet (50 mg total) by mouth daily.    Marland Kitchen warfarin (COUMADIN) 5 MG tablet TAKE 1 TO 1 AND 1/2 TABLETS BY MOUTH ONCE DAILY AS DIRECTED BY  COUMADIN  CLINIC (Patient taking differently: Take 5-7.5 mg by mouth See admin instructions. Take 1 1/2 tablets (7.5 mg) by mouth on Monday, Wednesday, Friday, take 1 tablet (5 mg) on Sunday, Tuesday, Thursday, Saturday) 15 tablet 0   No current facility-administered medications on file prior to visit.     Allergies:  No Known Allergies   Physical Exam  There were no vitals filed for this visit. There is no height or weight on file to calculate BMI. No exam data present  General: well developed, well nourished, seated, in no evident distress Head: head normocephalic and atraumatic.   Neck: supple with no carotid or supraclavicular bruits Cardiovascular: regular rate and rhythm, no murmurs Musculoskeletal: no deformity Skin:  no rash/petichiae Vascular:  Normal pulses all extremities  Neurologic Exam Mental Status: Awake and fully alert. Oriented to place and time. Recent and remote memory intact. Attention span, concentration and fund of knowledge appropriate. Mood and affect appropriate.  Cranial Nerves: Fundoscopic exam reveals sharp  disc margins. Pupils equal, briskly reactive to light. Extraocular movements full without nystagmus. Visual fields full to confrontation. Hearing intact. Facial sensation intact. Face, tongue, palate moves normally and symmetrically.  Motor: Normal bulk and tone. Normal strength in all tested extremity muscles. Sensory.: intact to touch , pinprick , position and vibratory sensation.  Coordination: Rapid alternating movements normal in all extremities. Finger-to-nose and heel-to-shin performed accurately bilaterally. Gait and Station: Arises from chair without difficulty. Stance is normal. Gait demonstrates normal stride length and balance. Able to heel, toe and tandem walk without difficulty.  Reflexes: 1+ and symmetric. Toes downgoing.    NIHSS  *** Modified  Rankin  *** CHA2DS2-VASc *** HAS-BLED ***   Diagnostic Data (Labs, Imaging, Testing)  Ct Angio Head W Or Wo Contrast Ct Angio Neck W Or Wo Contrast 10/06/2018 IMPRESSION:   CTA NECK:  1. Less than 50% stenosis bilateral ICA's.  2. Patent vertebral arteries, moderate stenosis LEFT V1 origin.  3. Abnormal anterior mediastinal vascular structure incompletely imaged, this could reflect enlarged RIGHT atrium or aneurysm. Recommend CT angiogram chest.  4. Severe C3-4 and C4-5 neural foraminal narrowing.   CTA HEAD:  1. No emergent large vessel occlusion.  2. Severe LEFT A2 and RIGHT M3 stenosis.  3. Moderate intracranial atherosclerosis.    Dg Chest 2 View 10/06/2018 IMPRESSION:  1. Prominent but chronic enlargement of the cardiopericardial silhouette, without edema.  2. Right mid lung atelectasis or scarring.  3.  Aortic Atherosclerosis (ICD10-I70.0).  4. Thoracic spondylosis.    Ct Head Wo Contrast 10/06/2018 IMPRESSION:  1. Acute on chronic right sphenoid sinusitis. Chronic paranasal sinusitis.  2. No acute intracranial findings.  3. Periventricular white matter and corona radiata hypodensities favor chronic  ischemic microvascular white matter disease. Old left frontal corona radiata lacunar infarct.    Mr Brain Wo Contrast 10/07/2018 IMPRESSION:  1. No acute intracranial abnormality.  2. Chronic small vessel ischemia and old left frontal infarct.  3. Moderate paranasal sinus disease.   2D ECHO 10/08/2018 Impressions: - No grossly evident valvular vegetations.   Torrential tricuspid valve regurgitation with annular dilatation   and lack of coaptation of tricuspid valve leaflets. Massive right   atrial diatilation. Severe RV dilatation with mild-moderately   reduced systolic function. Moderate-severe mitral valve   regurgutation. Severe left atrial dilatation. RVSP 57 mmHg, RA   pressure estimate at least 20 mmHg. Moderate pulmonary valve   regurgitation.    ASSESSMENT: Joanna [name] is a 78 y.o. year old female here with recrudescence of previous stroke due to ongoing bronchitis on 10/06/2018. Vascular risk factors include prior left MCA territory strokes with deficit of right hemiparesis, CHF, CKD and AF on AC.     PLAN:  1. Recrudescence of prior stroke: Continue {anticoagulants:31417}  and ***  for secondary stroke prevention. Maintain strict control of hypertension with blood pressure goal below 130/90, diabetes with hemoglobin A1c goal below 6.5% and cholesterol with LDL cholesterol (bad cholesterol) goal below 70 mg/dL.  I also advised the patient to eat a healthy diet with plenty of whole grains, cereals, fruits and vegetables, exercise regularly with at least 30 minutes of continuous activity daily and maintain ideal body weight. 2. HTN: Advised to continue current treatment regimen.  Today's BP ***.  Advised to continue to monitor at home along with continued follow-up with PCP for management 3. HLD: Advised to continue current treatment regimen along with continued follow-up with PCP for future prescribing and monitoring of lipid panel 4. DMII: Advised to continue to  monitor glucose levels at home along with continued follow-up with PCP for management and monitoring    Follow up in *** or call earlier if needed   Greater than 50% of time during this 25 minute visit was spent on counseling, explanation of diagnosis of ***, reviewing risk factor management of ***, planning of further management along with potential future management, and discussion with patient and family answering all questions.    George Hugh, AGNP-BC  [facility] [address] [place], [place] [address]  Phone [phone] Fax [phone] Note: This document was prepared with digital dictation and possible smart phrase  technology. Any transcriptional errors that result from this process are unintentional.

## 2018-11-23 ENCOUNTER — Telehealth: Payer: Self-pay

## 2018-11-23 ENCOUNTER — Encounter: Payer: Self-pay | Admitting: Adult Health

## 2018-11-23 ENCOUNTER — Ambulatory Visit: Payer: Self-pay | Admitting: Adult Health

## 2018-11-23 NOTE — Telephone Encounter (Signed)
Patient was a no call/no show for their appointment today.   

## 2018-12-06 ENCOUNTER — Telehealth: Payer: Self-pay

## 2018-12-06 NOTE — Telephone Encounter (Signed)
Left msg for inr overdue

## 2018-12-13 ENCOUNTER — Telehealth: Payer: Self-pay

## 2018-12-13 NOTE — Telephone Encounter (Signed)
Scheduled overdue inr 

## 2018-12-18 ENCOUNTER — Ambulatory Visit (INDEPENDENT_AMBULATORY_CARE_PROVIDER_SITE_OTHER): Payer: Medicare HMO

## 2018-12-18 DIAGNOSIS — I4821 Permanent atrial fibrillation: Secondary | ICD-10-CM | POA: Diagnosis not present

## 2018-12-18 DIAGNOSIS — I4891 Unspecified atrial fibrillation: Secondary | ICD-10-CM

## 2018-12-18 DIAGNOSIS — Z5181 Encounter for therapeutic drug level monitoring: Secondary | ICD-10-CM | POA: Diagnosis not present

## 2018-12-18 LAB — POCT INR: INR: 1.8 — AB (ref 2.0–3.0)

## 2018-12-18 MED ORDER — WARFARIN SODIUM 5 MG PO TABS: ORAL_TABLET | ORAL | 0 refills | Status: DC

## 2018-12-18 NOTE — Patient Instructions (Signed)
Please take 1.5 tablets tonight, 2 tablets tomorrow, then resume taking 1 tablet everyday except 1.5 tablets on Mondays, Wednesdays, and Fridays.  Recheck INR in 2 weeks.   Call [facility]  #[phone] with any changes

## 2019-01-01 ENCOUNTER — Ambulatory Visit (INDEPENDENT_AMBULATORY_CARE_PROVIDER_SITE_OTHER): Payer: Medicare HMO | Admitting: *Deleted

## 2019-01-01 DIAGNOSIS — Z5181 Encounter for therapeutic drug level monitoring: Secondary | ICD-10-CM

## 2019-01-01 DIAGNOSIS — I4891 Unspecified atrial fibrillation: Secondary | ICD-10-CM | POA: Diagnosis not present

## 2019-01-01 DIAGNOSIS — I4821 Permanent atrial fibrillation: Secondary | ICD-10-CM

## 2019-01-01 LAB — POCT INR: INR: 2.1 (ref 2.0–3.0)

## 2019-01-01 MED ORDER — WARFARIN SODIUM 5 MG PO TABS: ORAL_TABLET | ORAL | 0 refills | Status: DC

## 2019-01-01 NOTE — Patient Instructions (Signed)
Description   Please take 1.5 tablets tonight then start taking 1.5 tablets everyday except 1 tablet on Tuesdays, Thursdays, Saturdays. Recheck INR in 2 weeks.  Call [facility]  #[phone] with any changes

## 2019-01-04 ENCOUNTER — Telehealth: Payer: Self-pay | Admitting: Nurse Practitioner

## 2019-01-04 NOTE — Telephone Encounter (Signed)
Called and spoke with pharmacist at [facility], advised the pharmacist that we have patient listed as taking Metoprolol Succinate 50MG  1 tablet once a day.

## 2019-01-04 NOTE — Telephone Encounter (Signed)
° ° °  Pt c/o medication issue:  1. Name of Medication: Metoprolol 2. How are you currently taking this medication (dosage and times per day)? Clarification needed  3. Are you having a reaction (difficulty breathing--STAT)? no  4. What is your medication issue? Pharmacy calling to verify dosage

## 2019-01-09 ENCOUNTER — Other Ambulatory Visit: Payer: Self-pay | Admitting: Nurse Practitioner

## 2019-01-10 ENCOUNTER — Other Ambulatory Visit: Payer: Self-pay

## 2019-01-10 ENCOUNTER — Other Ambulatory Visit: Payer: Self-pay | Admitting: Nurse Practitioner

## 2019-01-10 ENCOUNTER — Other Ambulatory Visit: Payer: Self-pay | Admitting: Cardiology

## 2019-01-10 MED ORDER — METOPROLOL SUCCINATE ER 50 MG PO TB24: 50.0000 mg | ORAL_TABLET | Freq: Every day | ORAL | 1 refills | Status: DC

## 2019-01-10 NOTE — Telephone Encounter (Signed)
 *  STAT* If patient is at the pharmacy, call can be transferred to refill team.   1. Which medications need to be refilled? (please list name of each medication and dose if known) metoprolol succinate (TOPROL-XL) 50 MG 24 hr tablet  2. Which pharmacy/location (including street and city if local pharmacy) is medication to be sent to? [facility]   3. Do they need a 30 day or 90 day supply? 90 days  Pharmacy has not received script, please send to pharmacy on [facility]

## 2019-01-10 NOTE — Telephone Encounter (Signed)
  Daughter is calling from the pharmacy stating that her mother takes 100 MG of Metoprolol not the 50 mg. She says Norma Fredrickson prescribed the 100 MG and the 50 MG came from the nursing home. Daughter would like script changed so she can get the meds because her mother is out of this medication. She is at the pharmacy now.

## 2019-01-11 NOTE — Telephone Encounter (Signed)
Our records note 100 mg of lopressor BID.  I have not seen her in over a year.   Ok to refill this for one month - but needs follow up arranged please.   Rosalio Macadamia, RN, ANP-C [facility] [address] [place], [place]  [address] [phone]

## 2019-01-11 NOTE — Telephone Encounter (Signed)
Pt has a follow up appointment scheduled with Norma Fredrickson, NP 01/15/2019 at 9:30 am.  Metoprolol succinate 50 mg daily was refilled 01/10/2019 at 1357 by Shea Evans, CMA #[id number] + refill X 1. Last office visit with Norma Fredrickson, NP does state Metoprolol tartrate 100 mg BID however the 10/06/2018 discharge instructions state to d/c this and starting Metoprolol Succinate 50 mg daily.

## 2019-01-11 NOTE — Telephone Encounter (Signed)
Will address at her OV next week.

## 2019-01-11 NOTE — Telephone Encounter (Signed)
I spoke to the patient's daughter [name]) about her mother's medication.  The patient has been taking Metoprolol Tartrate 100 mg bid since her hospital d/c and never started the prescribed Metoprolol Succinate 50 mg Daily tablet.    She took her last pill 1 day ago and does not f/u with [name] until 3/3.  [facility] filled the patient's Toprol 50 mg on 2/27 so I advised the daughter to pick that up and start her mother on that until further advised on 3/3 by [name].  She verbalized understanding and will do so.

## 2019-01-11 NOTE — Telephone Encounter (Addendum)
Patient's daughter called she wants to get her mother's medication corrected.  She states it has been two days that her mother has not taken her medication because she does not have the correct dose.  She would like a call from the nurse. She states her mother should be taking metoprolol 100mg  not metoprolol succinate (TOPROL-XL) 50 MG 24 hr tablet

## 2019-01-15 ENCOUNTER — Ambulatory Visit: Payer: Medicare HMO | Admitting: Nurse Practitioner

## 2019-01-15 NOTE — Telephone Encounter (Signed)
[name] wanted in pt's chart pt no showed for appt.  Pt was to discuss toprol.

## 2019-01-15 NOTE — Progress Notes (Deleted)
CARDIOLOGY OFFICE NOTE  Date:  01/15/2019    Joanna [name] Date of Birth: 06/17/1941 Medical Record #[id number]  PCP:  Joanna Rainier, MD  Cardiologist:  Joanna [name] & ***    No chief complaint on file.   History of Present Illness: Joanna [name] is a 78 y.o. female who presents today for a ***   past medical history significant for permanent atrial fibrillation on warfarin, prior history of middle cerebral artery CVA 2012, recurrent stroke 2016, hypertension and prior LV dysfunction.  He was last seen by Joanna [name] on 01/10/2018 at which time she was doing well.  Joanna [name] did note that the patient had not seen a primary care provider in quite some time and the patient was encouraged to follow-up with her PCP.  Joanna [name] is here with her daughter. She is complaining of lower extremity edema. She denies chest discomfort, dyspnea, orthopnea or PND. No palpitations or lightheadedness. She had lasix 40 mg QOD on her med list but states that she has not been taking it for a long time. The bottle that she brings in with her is dated 11/2016 and is full.  Her daughter says that she does not like to have to urinate so much. Her daughter also reports that in the past she stopped taking her coumadin thinking that she was taking too many medications and that is when she had a stroke. She says that she learned her lesson and is now compliant with the coumadin. She also admits that she uses a lot of salt.    Former patient of Dr. Pryor Curia [name]'s.She now follows with me. Did notwish to transition to the Joanna [name].   She has a past history of chronic permanent atrial fibrillation. She is a long-term warfarin. She's had a prior history of a middle cerebral artery CVA in 2012. She retired from work after that stroke. 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 Other issues include HTN, prior LV dysfunction.   Admitted back  in [name] 2016with recurrent stroke. Discharge summary reviewed - apparently did not wish to switch to DOAC. She remains on coumadin. Echo was updated again.Noted to beresistant to allowing family support.  I last saw her last January of 2018 - she was doing well.   Comes back today. Herealone today.She says she is doing well. No pain. No chest pain. Breathing is good. Sounds like she has not gone to see PCP "in some time". Her only issue is urinary frequency. Tolerating her coumadin. No falls. No bleeding or excessive bruising. She feels like she is doing ok overall. She has lost 14 pounds since I last saw her - she has not really been trying - says she eats less.             Comes in today. Here with   Past Medical History:  Diagnosis Date  . Acute renal failure (HCC) 02/23/2007-03/22/2007   Now with normalization back to baseline  . Aphasia 2012  . CHF (congestive heart failure) (HCC)   . Chronic atrial fibrillation   . Chronic kidney disease   . Diverticulosis   . Dysnomia 12/2010  . Hypertension   . Ischemic bowel disease (HCC)    With GI bleeding  . Left ventricular dysfunction    EF is now 50-55% as of 2012  . Obesity   . Splenic infarct   . Stroke [name]) February 2012   Now back on  coumadin    Past Surgical History:  Procedure Laterality Date  . COLECTOMY  12/2010   partial  . ILEOSTOMY    . OMENTECTOMY     partial with ileostomy     Medications: No outpatient medications have been marked as taking for the 01/15/19 encounter (Appointment) with Joanna Macadamia, NP.     Allergies: No Known Allergies  Social History: The patient  reports that she has never smoked. She has never used smokeless tobacco. She reports that she does not drink alcohol or use drugs.   Family History: [name] ***family history includes Heart disease in her mother; Hypertension in her sister.   Review of Systems: Please see the history of present illness.    Otherwise, the review of systems is positive for {NONE DEFAULTED:18576::"none"}.   All other systems are reviewed and negative.   Physical Exam: VS:  There were no vitals taken for this visit. Marland Kitchen  BMI There is no height or weight on file to calculate BMI.  Wt Readings from Last 3 Encounters:  10/06/18 161 lb 2.5 oz (73.1 kg)  07/18/18 160 lb (72.6 kg)  01/10/18 174 lb 6.4 oz (79.1 kg)    General: Pleasant. Well developed, well nourished and in no acute distress.   HEENT: Normal.  Neck: Supple, no JVD, carotid bruits, or masses noted.  Cardiac: ***Regular rate and rhythm. No murmurs, rubs, or gallops. No edema.  Respiratory:  Lungs are clear to auscultation bilaterally with normal work of breathing.  GI: Soft and nontender.  MS: No deformity or atrophy. Gait and ROM intact.  Skin: Warm and dry. Color is normal.  Neuro:  Strength and sensation are intact and no gross focal deficits noted.  Psych: Alert, appropriate and with normal affect.   LABORATORY DATA:  EKG:  EKG {ACTION; IS/IS OLM:[id number]} ordered today. This demonstrates ***.  Lab Results  Component Value Date   WBC 4.9 10/10/2018   HGB 13.0 10/10/2018   HCT 40.7 10/10/2018   PLT 166 10/10/2018   GLUCOSE 85 10/10/2018   CHOL 87 10/07/2018   TRIG 60 10/07/2018   HDL 22 (L) 10/07/2018   LDLCALC 53 10/07/2018   ALT 9 01/23/2018   AST 22 01/23/2018   NA 137 10/10/2018   K 3.7 10/10/2018   CL 104 10/10/2018   CREATININE 0.88 10/10/2018   BUN 10 10/10/2018   CO2 27 10/10/2018   TSH 4.630 (H) 01/23/2018   INR 2.1 01/01/2019   HGBA1C 5.5 10/07/2018     BNP (last 3 results) No results for input(s): BNP in the last 8760 hours.  ProBNP (last 3 results) No results for input(s): PROBNP in the last 8760 hours.   Other Studies Reviewed Today:  Echocardiogram 11/28/2016 Study Conclusions - Left ventricle: The cavity size was normal. Wall thickness was increased in a pattern of mild LVH. Systolic function was  normal. The estimated ejection fraction was in the range of 55% to 60%. - Mitral valve: Calcified annulus. Mildly thickened leaflets . There was mild regurgitation. - Left atrium: The atrium was severely dilated. - Right ventricle: The cavity size was severely dilated. Systolic function was mildly reduced. - Right atrium: The atrium was massively dilated. - Tricuspid valve: There was moderate-severe regurgitation. - Pulmonic valve: There was moderate regurgitation. - Pulmonary arteries: PA peak pressure: 78 mm Hg (S).    Echo Study Conclusions from 10/2015 - Left ventricle: The cavity size was normal. Systolic function was  normal. The estimated ejection fraction was  in the range of 50%  to 55%. Wall motion was normal; there were no regional wall  motion abnormalities. - Aortic valve: Trileaflet; mildly thickened, mildly calcified  leaflets. - Mitral valve: Calcified annulus. There was mild to moderate  regurgitation. - Left atrium: The atrium was severely dilated. - Right ventricle: The cavity size was mildly dilated. Wall  thickness was normal. Systolic function was mildly reduced. - Right atrium: The atrium was massively dilated. - Tricuspid valve: There was severe regurgitation. - Pulmonary arteries: PA peak pressure: 56 mm Hg (S).  Impressions: - The right ventricular systolic pressure was increased consistent  with moderate pulmonary hypertension.  Assessment/Plan: 1. Lower leg edema   2. Permanent atrial fibrillation (HCC)   3. History of stroke   4. Essential (primary) hypertension   5. Chronic combined systolic and diastolic heart failure (HCC)   6. Bilateral carotid artery stenosis    PLAN:    In order of problems listed above:  Lower extremity edema: No dyspnea, orthopnea or PND.  The patient was previously ordered Lasix 40 mg every other day, but she has not been taking it for probably at least a year.  I will restart her Lasix 40 mg  every other day but advised her to take 40 mg daily for 4 days to try to reduce her lower extremity edema.  Also advised on low-sodium diet, less than 2 g/day. Elevate legs. Use compression stockings- her daughter will get them.   Permanent atrial fibrillation: Rate controlled. Asymptomatic.  Anticoagulated with warfarin for stroke risk reduction  Prior CVA: Patient is on warfarin for stroke risk reduction having refused to switching to DOAC in the past.  Hypertension: BP well controlled.   Chronic combined systolic and diastolic heart failure: Most recent echocardiogram in 11/2016 showed mild LVH with EF 55-60% with severely dilated bilateral atria and moderate-severe TR, moderate PR  Carotid artery stenosis: Carotid duplex 11/28/2016 showed bilateral 1-39% stenosis.  Currently does not need intervention.    Current medicines are reviewed with the patient today.  The patient does not have concerns regarding medicines other than what has been noted above.  The following changes have been made:  See above.  Labs/ tests ordered today include:   No orders of the defined types were placed in this encounter.    Disposition:   FU with *** in {gen number 5-37:[id number]} {Days to years:10300}.   Patient is agreeable to this plan and will call if any problems develop in the interim.   SignedNorma Fredrickson, NP  01/15/2019 7:20 AM  [facility] [address] [place], [place]  [address] Phone: [phone] Fax: [phone]

## 2019-01-16 ENCOUNTER — Encounter: Payer: Self-pay | Admitting: Nurse Practitioner

## 2019-01-22 ENCOUNTER — Ambulatory Visit (INDEPENDENT_AMBULATORY_CARE_PROVIDER_SITE_OTHER): Payer: Medicare HMO

## 2019-01-22 DIAGNOSIS — I4821 Permanent atrial fibrillation: Secondary | ICD-10-CM

## 2019-01-22 DIAGNOSIS — Z5181 Encounter for therapeutic drug level monitoring: Secondary | ICD-10-CM

## 2019-01-22 DIAGNOSIS — I4891 Unspecified atrial fibrillation: Secondary | ICD-10-CM | POA: Diagnosis not present

## 2019-01-22 LAB — POCT INR: INR: 1.5 — AB (ref 2.0–3.0)

## 2019-01-22 NOTE — Patient Instructions (Signed)
Description   Take 1.5 tablets today, then take 2 tablets tomorrow, then start taking 1.5 tablets everyday except 1 tablet on Tuesdays and Saturdays. Recheck INR in 10 days.  Call Coumadin Clinic  #[phone] with any changes

## 2019-01-28 ENCOUNTER — Ambulatory Visit: Payer: Medicare HMO | Admitting: Nurse Practitioner

## 2019-01-28 ENCOUNTER — Other Ambulatory Visit: Payer: Self-pay

## 2019-01-28 ENCOUNTER — Encounter: Payer: Self-pay | Admitting: Nurse Practitioner

## 2019-01-28 VITALS — BP 120/68 | HR 67 | Ht 63.0 in | Wt 143.4 lb

## 2019-01-28 DIAGNOSIS — I10 Essential (primary) hypertension: Secondary | ICD-10-CM

## 2019-01-28 DIAGNOSIS — I482 Chronic atrial fibrillation, unspecified: Secondary | ICD-10-CM

## 2019-01-28 DIAGNOSIS — I4821 Permanent atrial fibrillation: Secondary | ICD-10-CM

## 2019-01-28 MED ORDER — METOPROLOL SUCCINATE ER 50 MG PO TB24: 50.0000 mg | ORAL_TABLET | Freq: Every day | ORAL | 3 refills | Status: DC

## 2019-01-28 MED ORDER — DILTIAZEM HCL ER COATED BEADS 120 MG PO CP24: 120.0000 mg | ORAL_CAPSULE | Freq: Every day | ORAL | 3 refills | Status: DC

## 2019-01-28 NOTE — Progress Notes (Signed)
CARDIOLOGY OFFICE NOTE  Date:  01/28/2019    Joanna [name] Date of Birth: Mar 08, 1941 Medical Record #[id number]  PCP:  Juluis Rainier, MD  Cardiologist:  Tyrone Sage    Chief Complaint  Patient presents with  . Atrial Fibrillation    Follow up visit - seen for Dr. [place]    History of Present Illness: Joanna [name] is a 78 y.o. female who presents today for a follow up visit. Former patient of Dr. Pryor Curia Jordan's. Did notwish to transition to the [employer].   She has a past history of chronic permanent atrial fibrillation. She is a long-term warfarin. She's had a prior history of a middle cerebral artery CVA in 2012. She retired from work after that stroke. 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 Other issues include HTN, prior LV dysfunction.   Admitted back in [facility] 2016with recurrent stroke. Discharge summary reviewed - apparently did not wish to switch to DOAC. She remains on coumadin. Echo was updated again.Noted to beresistant to allowing family support.  I last saw her last February of 2019. She has been resistant to get PCP.   She was seen as a work in by [employer], NP in 07/2018 with lower extremity edema. Lots of sodium use noted.   She was admitted back in November - concern about worsening right sided hemiparesis and URi - thought to have recrudescence of prior stroke due to ongoing bronchitis. She had extensive neuro work up - no acute CVA. Was back to her baseline. She was quite deconditioned and SNF was recommended. Lopressor 100 mg was stopped and she was changed to Toprol 50.    Patient and daughter screened for recent travel, fever, URI symptoms and shortness of breath. Patient and daughter deny travel over the last 14 days and are currently without symptoms.    Comes in today. Here with her daughter. Daughter with mask on - not sick. Asking for a new PCP. Spent  about 3 weeks in rehab. Now back home. No chest pain. Breathing is ok. Balance is an issue. No falls. Coumadin has been hard to manage. She has had considerable weight loss - unclear as to why. Need medicines refilled. There were several phone notes from a few weeks ago - the dose of Lopressor was questionable and it was questionable as to what she was even taking.   Past Medical History:  Diagnosis Date  . Acute renal failure (HCC) 02/23/2007-03/22/2007   Now with normalization back to baseline  . Aphasia 2012  . CHF (congestive heart failure) (HCC)   . Chronic atrial fibrillation   . Chronic kidney disease   . Diverticulosis   . Dysnomia 12/2010  . Hypertension   . Ischemic bowel disease (HCC)    With GI bleeding  . Left ventricular dysfunction    EF is now 50-55% as of 2012  . Obesity   . Splenic infarct   . Stroke [facility]) February 2012   Now back on coumadin    Past Surgical History:  Procedure Laterality Date  . COLECTOMY  12/2010   partial  . ILEOSTOMY    . OMENTECTOMY     partial with ileostomy     Medications: Current Meds  Medication Sig  . diltiazem (CARTIA XT) 120 MG 24 hr capsule Take 1 capsule (120 mg total) by mouth daily.  . furosemide (LASIX) 40 MG tablet Take 1 tablet (40 mg total) by  mouth every other day. Take 40 mg daily for 4 days, then take 40 mg every other day. (Patient taking differently: Take 40 mg by mouth daily. )  . metoprolol succinate (TOPROL-XL) 50 MG 24 hr tablet Take 1 tablet (50 mg total) by mouth daily.  Marland Kitchen. warfarin (COUMADIN) 5 MG tablet TAKE 1 TO 1 AND 1/2 TABLETS BY MOUTH ONCE DAILY AS DIRECTED BY  COUMADIN  CLINIC  . [DISCONTINUED] diltiazem (CARTIA XT) 120 MG 24 hr capsule Take 1 capsule (120 mg total) by mouth daily.  . [DISCONTINUED] metoprolol succinate (TOPROL-XL) 50 MG 24 hr tablet Take 1 tablet (50 mg total) by mouth daily.     Allergies: No Known Allergies  Social History: The patient  reports that she has never smoked. She has  never used smokeless tobacco. She reports that she does not drink alcohol or use drugs.   Family History: [name] family history includes Heart disease in her mother; Hypertension in her sister.   Review of Systems: Please see the history of present illness.   Otherwise, the review of systems is positive for none.   All other systems are reviewed and negative.   Physical Exam: VS:  BP 120/68 (BP Location: Left Arm, Patient Position: Sitting, Cuff Size: Normal)   Pulse 67   Ht 5\' 3"  (1.6 m)   Wt 143 lb 6.4 oz (65 kg)   SpO2 99% Comment: at rest  BMI 25.40 kg/m  .  BMI Body mass index is 25.4 kg/m.  Wt Readings from Last 3 Encounters:  01/28/19 143 lb 6.4 oz (65 kg)  10/06/18 161 lb 2.5 oz (73.1 kg)  07/18/18 160 lb (72.6 kg)    General: Elderly. Alert. She is no acute distress. She is in a wheelchair.  HEENT: Normal.  Neck: Supple, no JVD, carotid bruits, or masses noted.  Cardiac: Irregular irregular rhythm. Regular rate and rhythm. No murmurs, rubs, or gallops. No edema.  Respiratory:  Lungs are clear to auscultation bilaterally with normal work of breathing.  GI: Soft and nontender.  MS: No deformity or atrophy. Gait and ROM intact.  Skin: Warm and dry. Color is normal.  Neuro:  Strength and sensation are intact and no gross focal deficits noted.  Psych: Alert, appropriate and with normal affect.   LABORATORY DATA:  EKG:  EKG is not ordered today.   Lab Results  Component Value Date   WBC 4.9 10/10/2018   HGB 13.0 10/10/2018   HCT 40.7 10/10/2018   PLT 166 10/10/2018   GLUCOSE 85 10/10/2018   CHOL 87 10/07/2018   TRIG 60 10/07/2018   HDL 22 (L) 10/07/2018   LDLCALC 53 10/07/2018   ALT 9 01/23/2018   AST 22 01/23/2018   NA 137 10/10/2018   K 3.7 10/10/2018   CL 104 10/10/2018   CREATININE 0.88 10/10/2018   BUN 10 10/10/2018   CO2 27 10/10/2018   TSH 4.630 (H) 01/23/2018   INR 1.5 (A) 01/22/2019   HGBA1C 5.5 10/07/2018     BNP (last 3 results)  No results for input(s): BNP in the last 8760 hours.  ProBNP (last 3 results) No results for input(s): PROBNP in the last 8760 hours.   Other Studies Reviewed Today:  Echo Study Conclusions 09/2018  - Study data: Rapid atrial fibrillation throughout exam. - Left ventricle: The cavity size was normal. Systolic function was   normal. The estimated ejection fraction was in the range of 50%   to 55%. Although no diagnostic  regional wall motion abnormality   was identified, this possibility cannot be completely excluded on   the basis of this study. The study was not technically sufficient   to allow evaluation of LV diastolic dysfunction due to atrial   fibrillation. - Aortic valve: Valve mobility was moderately restricted.   Transvalvular velocity was within the normal range. There was no   stenosis. There was no regurgitation. - Mitral valve: Mildly thickened leaflets . Chordal calcification.   Transvalvular velocity was within the normal range. There was no   evidence for stenosis. There was moderate to severe   regurgitation. - Left atrium: The atrium was severely dilated. - Right ventricle: The cavity size was severely dilated. Wall   thickness was normal. Systolic function was mildly to moderately   reduced. RV systolic pressure (S, est): 57 mm Hg. - Right atrium: The atrium was massively dilated. - Tricuspid valve: Severely dilated annulus with incomplete   tricuspid valve leaflet coaptation. Mildly thickened leaflets.   There was severe regurgitation. - Pulmonic valve: There was moderate regurgitation. - Pulmonary arteries: Systolic pressure was severely increased. PA   peak pressure: 57 mm Hg (S). - Inferior vena cava: The vessel was severely dilated.   Respirophasic changes in dimension were absent, consistent with   elevated central venous pressure. - Hepatic veins: The flow pattern showed systolic flow reversal,   suggestive of severe tricuspid regurgitation. -  Pericardium, extracardiac: A trivial pericardial effusion was   identified.  Impressions:  - No grossly evident valvular vegetations.     Torrential tricuspid valve regurgitation with annular dilatation   and lack of coaptation of tricuspid valve leaflets. Massive right   atrial diatilation. Severe RV dilatation with mild-moderately   reduced systolic function. Moderate-severe mitral valve   regurgutation. Severe left atrial dilatation. RVSP 57 mmHg, RA   pressure estimate at least 20 mmHg. Moderate pulmonary valve   regurgitation.     Compared to the exam from 11/28/2016, RV size appears larger and   RV function is slightly reduced. This may account for drop in   RVSP, suggesting worsening RV function in the setting of elevated   right sided heart pressures. Side by side comparison of images   performed.   Assessment/Plan:  1. Permanent atrial fibrillation - she is managed with rate control and anticoagulation with Coumadin. HR is controlled. Medicines refilled - now on Toprol XL with her CCB.   2.PriorCVA - refused switching to DOAC - she has elected to stay on coumadin. Cost would probably be big factor.  3. HTN - BPlooks good - no changes made.   4. Prior left ventricular dysfunction but has improved - last echo from November noted with EF of 50%.   5. Chronic anticoagulation- needs lab today.   6. Weight loss - needs labs  Current medicines are reviewed with the patient today.  The patient does not have concerns regarding medicines other than what has been noted above.  The following changes have been made:  See above.  Labs/ tests ordered today include:    Orders Placed This Encounter  Procedures  . Basic metabolic panel  . CBC  . TSH     Disposition:   FU with [place] in 6 months.    Patient is agreeable to this plan and will call if any problems develop in the interim.   SignedNorma Fredrickson, NP  01/28/2019 3:58 PM  [facility]  [facility] [address] [place], [place]  [address] Phone: [phone] Fax: [phone]

## 2019-01-28 NOTE — Patient Instructions (Addendum)
We will be checking the following labs today - BMET, CBC and TSH   Medication Instructions:    Continue with your current medicines.    If you need a refill on your cardiac medications before your next appointment, please call your pharmacy.     Testing/Procedures To Be Arranged:  N/A  Follow-Up:   See [place] back in 6 months.     At [facility], you and your health needs are our priority.  As part of our continuing mission to provide you with exceptional heart care, we have created designated Provider Care Teams.  These Care Teams include your primary Cardiologist (physician) and Advanced Practice Providers (APPs -  Physician Assistants and Nurse Practitioners) who all work together to provide you with the care you need, when you need it.  Special Instructions:  . None  Call the [facility] office at [phone] if you have any questions, problems or concerns.

## 2019-01-30 LAB — BASIC METABOLIC PANEL
BUN/Creatinine Ratio: 14 (ref 12–28)
BUN: 14 mg/dL (ref 8–27)
CO2: 25 mmol/L (ref 20–29)
Calcium: 9.3 mg/dL (ref 8.7–10.3)
Chloride: 105 mmol/L (ref 96–106)
Creatinine, Ser: 0.99 mg/dL (ref 0.57–1.00)
GFR calc Af Amer: 63 mL/min/{1.73_m2} (ref >59)
GFR calc non Af Amer: 55 mL/min/{1.73_m2} — ABNORMAL LOW (ref >59)
Glucose: 92 mg/dL (ref 65–99)
Potassium: 4.3 mmol/L (ref 3.5–5.2)
Sodium: 141 mmol/L (ref 134–144)

## 2019-01-30 LAB — CBC
Hematocrit: 39.8 % (ref 34.0–46.6)
Hemoglobin: 13.1 g/dL (ref 11.1–15.9)
MCH: 32.9 pg (ref 26.6–33.0)
MCHC: 32.9 g/dL (ref 31.5–35.7)
MCV: 100 fL — ABNORMAL HIGH (ref 79–97)
Platelets: 194 10*3/uL (ref 150–450)
RBC: 3.98 x10E6/uL (ref 3.77–5.28)
RDW: 11.8 % (ref 11.7–15.4)
WBC: 4 10*3/uL (ref 3.4–10.8)

## 2019-01-30 LAB — TSH: TSH: 2.93 u[IU]/mL (ref 0.450–4.500)

## 2019-01-31 ENCOUNTER — Telehealth: Payer: Self-pay | Admitting: *Deleted

## 2019-01-31 ENCOUNTER — Telehealth: Payer: Self-pay

## 2019-01-31 NOTE — Telephone Encounter (Signed)
Pt's daughter call back per [facility]) is aware of lab results. Stated lab results were given to pt as well.

## 2019-01-31 NOTE — Telephone Encounter (Signed)

## 2019-02-01 ENCOUNTER — Ambulatory Visit (INDEPENDENT_AMBULATORY_CARE_PROVIDER_SITE_OTHER): Payer: Medicare HMO | Admitting: *Deleted

## 2019-02-01 ENCOUNTER — Other Ambulatory Visit: Payer: Self-pay

## 2019-02-01 DIAGNOSIS — I4891 Unspecified atrial fibrillation: Secondary | ICD-10-CM | POA: Diagnosis not present

## 2019-02-01 DIAGNOSIS — Z5181 Encounter for therapeutic drug level monitoring: Secondary | ICD-10-CM | POA: Diagnosis not present

## 2019-02-01 DIAGNOSIS — I4821 Permanent atrial fibrillation: Secondary | ICD-10-CM | POA: Diagnosis not present

## 2019-02-01 LAB — POCT INR: INR: 2.3 (ref 2.0–3.0)

## 2019-02-01 NOTE — Patient Instructions (Addendum)
  Description   Today take 2 tablets then start taking 1.5 tablets everyday except 1 tablet on Saturdays. Recheck INR in 2 weeks.  Call Coumadin Clinic  #[phone] with any changes

## 2019-02-10 ENCOUNTER — Other Ambulatory Visit: Payer: Self-pay | Admitting: Cardiology

## 2019-02-14 ENCOUNTER — Telehealth: Payer: Self-pay

## 2019-02-14 NOTE — Telephone Encounter (Signed)
LMOM FOR PRESCREEN/DRIVE THRU 

## 2019-02-15 NOTE — Telephone Encounter (Signed)
Patient returned call

## 2019-02-15 NOTE — Telephone Encounter (Signed)
RESCHEDULED FOR 02/18/19 1. Do you currently have a fever? NO (yes = cancel and refer to pcp for e-visit) 2. Have you recently travelled on a cruise, internationally, or to [place], [place], [place], [place], [place], or [place], [place] [employer]) ? NO (yes = cancel, stay home, monitor symptoms, and contact pcp or initiate e-visit if symptoms develop) 3. Have you been in contact with someone that is currently pending confirmation of Covid19 testing or has been confirmed to have the Covid19 virus?  NO (yes = cancel, stay home, away from tested individual, monitor symptoms, and contact pcp or initiate e-visit if symptoms develop) 4. Are you currently experiencing fatigue or cough? NO (yes = pt should be prepared to have a mask placed at the time of their visit).  Pt. Advised that we are restricting visitors at this time and anyone present in the vehicle should meet the above criteria as well. Advised that visit will be at curbside for finger stick ONLY and will receive call with instructions. Pt also advised to please bring own pen for signature of arrival document.

## 2019-02-25 ENCOUNTER — Telehealth: Payer: Self-pay

## 2019-02-25 NOTE — Telephone Encounter (Signed)
lmom for prescreen  

## 2019-02-26 ENCOUNTER — Other Ambulatory Visit: Payer: Self-pay

## 2019-02-26 ENCOUNTER — Ambulatory Visit (INDEPENDENT_AMBULATORY_CARE_PROVIDER_SITE_OTHER): Payer: Medicare HMO

## 2019-02-26 DIAGNOSIS — I4891 Unspecified atrial fibrillation: Secondary | ICD-10-CM

## 2019-02-26 DIAGNOSIS — Z5181 Encounter for therapeutic drug level monitoring: Secondary | ICD-10-CM

## 2019-02-26 DIAGNOSIS — I4821 Permanent atrial fibrillation: Secondary | ICD-10-CM

## 2019-02-26 LAB — POCT INR: INR: 2.3 (ref 2.0–3.0)

## 2019-02-26 NOTE — Patient Instructions (Signed)
Description   Called spoke with pt and pt's daughter, advised to start taking 1.5 tablets everyday.  Recheck INR in 3 weeks.  Call Coumadin Clinic  #[phone] with any changes

## 2019-03-17 ENCOUNTER — Other Ambulatory Visit: Payer: Self-pay | Admitting: Cardiology

## 2019-03-18 ENCOUNTER — Telehealth: Payer: Self-pay

## 2019-03-18 NOTE — Telephone Encounter (Signed)

## 2019-03-26 ENCOUNTER — Telehealth: Payer: Self-pay

## 2019-03-26 NOTE — Telephone Encounter (Signed)

## 2019-03-27 ENCOUNTER — Ambulatory Visit (INDEPENDENT_AMBULATORY_CARE_PROVIDER_SITE_OTHER): Payer: Medicare HMO

## 2019-03-27 ENCOUNTER — Other Ambulatory Visit: Payer: Self-pay

## 2019-03-27 DIAGNOSIS — Z5181 Encounter for therapeutic drug level monitoring: Secondary | ICD-10-CM | POA: Diagnosis not present

## 2019-03-27 DIAGNOSIS — I4891 Unspecified atrial fibrillation: Secondary | ICD-10-CM | POA: Diagnosis not present

## 2019-03-27 LAB — POCT INR: INR: 3.4 — AB (ref 2.0–3.0)

## 2019-03-27 NOTE — Patient Instructions (Addendum)
  Description   Called spoke with pt and pt's daughter, advised to continue on same dosage 1.5 tablets everyday.  Recheck INR in 4 weeks.  Call Coumadin Clinic  #[phone] with any changes

## 2019-04-17 ENCOUNTER — Telehealth: Payer: Self-pay

## 2019-04-17 NOTE — Telephone Encounter (Signed)
Called to screen for covid but pt stated that they want to call back and confirm after they verify when they can come

## 2019-04-29 ENCOUNTER — Ambulatory Visit (INDEPENDENT_AMBULATORY_CARE_PROVIDER_SITE_OTHER): Payer: Medicare HMO | Admitting: Pharmacist

## 2019-04-29 ENCOUNTER — Other Ambulatory Visit: Payer: Self-pay

## 2019-04-29 DIAGNOSIS — Z5181 Encounter for therapeutic drug level monitoring: Secondary | ICD-10-CM

## 2019-04-29 DIAGNOSIS — I4891 Unspecified atrial fibrillation: Secondary | ICD-10-CM | POA: Diagnosis not present

## 2019-04-29 LAB — POCT INR: INR: 4.2 — AB (ref 2.0–3.0)

## 2019-04-29 NOTE — Patient Instructions (Addendum)
  Description   Skip your Coumadin today, then continue on same dosage 1.5 tablets every day.  Recheck INR in 3 weeks.  Call Coumadin Clinic  [phone] with any changes

## 2019-05-13 ENCOUNTER — Telehealth: Payer: Self-pay

## 2019-05-13 NOTE — Telephone Encounter (Signed)

## 2019-05-20 ENCOUNTER — Ambulatory Visit: Payer: Medicare HMO | Admitting: Podiatry

## 2019-05-29 ENCOUNTER — Other Ambulatory Visit: Payer: Self-pay

## 2019-05-29 ENCOUNTER — Ambulatory Visit (INDEPENDENT_AMBULATORY_CARE_PROVIDER_SITE_OTHER): Payer: Medicare HMO | Admitting: Pharmacist

## 2019-05-29 DIAGNOSIS — Z5181 Encounter for therapeutic drug level monitoring: Secondary | ICD-10-CM | POA: Diagnosis not present

## 2019-05-29 DIAGNOSIS — I4891 Unspecified atrial fibrillation: Secondary | ICD-10-CM

## 2019-05-29 LAB — POCT INR: INR: 4 — AB (ref 2.0–3.0)

## 2019-05-29 NOTE — Patient Instructions (Signed)
Skip your Coumadin today, then start taking 1.5 tablets every day except 1 tablet on Saturdays.  Recheck INR in 2-3 weeks.  Call Coumadin Clinic  [phone] with any changes

## 2019-06-08 ENCOUNTER — Other Ambulatory Visit: Payer: Self-pay | Admitting: Cardiology

## 2019-06-10 NOTE — Telephone Encounter (Signed)
Refill Request.  

## 2019-06-11 ENCOUNTER — Telehealth: Payer: Self-pay | Admitting: *Deleted

## 2019-06-11 NOTE — Telephone Encounter (Signed)
Attempted to call pt to prescreen pt for appointment on 7/31.

## 2019-06-14 ENCOUNTER — Ambulatory Visit (INDEPENDENT_AMBULATORY_CARE_PROVIDER_SITE_OTHER): Payer: Medicare HMO | Admitting: Pharmacist

## 2019-06-14 ENCOUNTER — Other Ambulatory Visit: Payer: Self-pay

## 2019-06-14 DIAGNOSIS — Z5181 Encounter for therapeutic drug level monitoring: Secondary | ICD-10-CM

## 2019-06-14 DIAGNOSIS — I4891 Unspecified atrial fibrillation: Secondary | ICD-10-CM | POA: Diagnosis not present

## 2019-06-14 LAB — POCT INR: INR: 4 — AB (ref 2.0–3.0)

## 2019-06-14 NOTE — Patient Instructions (Signed)
Description   Skip your Coumadin today, then start taking 1.5 tablets every day except 1 tablet on Tuesdays and Saturdays.  Recheck INR in 2 weeks.  Call Coumadin Clinic  [phone] with any changes

## 2019-07-07 ENCOUNTER — Other Ambulatory Visit: Payer: Self-pay | Admitting: Cardiology

## 2019-07-24 ENCOUNTER — Other Ambulatory Visit: Payer: Self-pay

## 2019-07-24 ENCOUNTER — Ambulatory Visit (INDEPENDENT_AMBULATORY_CARE_PROVIDER_SITE_OTHER): Payer: Medicare HMO | Admitting: Pharmacist

## 2019-07-24 DIAGNOSIS — Z5181 Encounter for therapeutic drug level monitoring: Secondary | ICD-10-CM

## 2019-07-24 DIAGNOSIS — I4891 Unspecified atrial fibrillation: Secondary | ICD-10-CM

## 2019-07-24 LAB — POCT INR: INR: 2.2 (ref 2.0–3.0)

## 2019-07-24 NOTE — Patient Instructions (Signed)
Description   Take 2 tablets today, then continue taking 1.5 tablets every day except 1 tablet on Tuesdays and Saturdays.  Recheck INR in 2 weeks. Call Coumadin Clinic  [phone] with any changes

## 2019-08-01 NOTE — Progress Notes (Deleted)
CARDIOLOGY OFFICE NOTE  Date:  08/01/2019    [name] Date of Birth: 1941/05/03 Medical Record #[id number]  PCP:  Leighton Ruff, MD  Cardiologist:  Servando Snare & ***    No chief complaint on file.   History of Present Illness: Joanna [name] is a 78 y.o. female who presents today for a 6 month check. Former patient of Dr. Bjorn Pippin Jordan's. Did notwish to transition to the [employer].   She has a past history of chronic permanent atrial fibrillation. She is a long-term warfarin. She's had a prior history of a middle cerebral artery CVA in 2012. She retired from work after that stroke. 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 Other issues include HTN, prior LV dysfunction.   Admitted back in [facility] 2016with recurrent stroke. Discharge summary reviewed - apparently did not wish to switch to [place]. She remains on coumadin. Echo was updated again.Noted to beresistant to allowing family support.  I last saw herlast February of 2019. She has been resistant to get PCP at that time. She was then seen as a work in by [employer], NP in 07/2018 with lower extremity edema. Lots of sodium use noted.   She was admitted back in November - concern about worsening right sided hemiparesis and URi - thought to have recrudescence of prior stroke due to ongoing bronchitis. She had extensive neuro work up - no acute CVA. Was back to her baseline. She was quite deconditioned and SNF was recommended. Lopressor 100 mg was stopped and she was changed to Toprol 50.  My last visit was back in March - she had had considerable weight loss, coumadin noted to be hard to regulate, was taking different dose of Toprol than recommended but overall was felt to be stable from our standpoint.   The patient {does/does not:200015} have symptoms concerning for COVID-19 infection (fever, chills, cough, or new shortness of breath).    Comes in today. Here with   Past Medical History:  Diagnosis Date  . Acute renal failure ([place]) 02/23/2007-03/22/2007   Now with normalization back to baseline  . Aphasia 2012  . CHF (congestive heart failure) ([place])   . Chronic atrial fibrillation   . Chronic kidney disease   . Diverticulosis   . Dysnomia 12/2010  . Hypertension   . Ischemic bowel disease ([place])    With GI bleeding  . Left ventricular dysfunction    EF is now 50-55% as of 2012  . Obesity   . Splenic infarct   . Stroke [facility]) February 2012   Now back on coumadin    Past Surgical History:  Procedure Laterality Date  . COLECTOMY  12/2010   partial  . ILEOSTOMY    . OMENTECTOMY     partial with ileostomy     Medications: No outpatient medications have been marked as taking for the 08/05/19 encounter (Appointment) with Burtis Junes, NP.     Allergies: No Known Allergies  Social History: The patient  reports that she has never smoked. She has never used smokeless tobacco. She reports that she does not drink alcohol or use drugs.   Family History: [name] ***family history includes Heart disease in her mother; Hypertension in her sister.   Review of Systems: Please see the history of present illness.   All other systems are reviewed and negative.   Physical Exam: VS:  There were no vitals taken for this visit. Marland Kitchen  BMI There is no height or weight on file to calculate BMI.  Wt Readings from Last 3 Encounters:  01/28/19 143 lb 6.4 oz (65 kg)  10/06/18 161 lb 2.5 oz (73.1 kg)  07/18/18 160 lb (72.6 kg)    General: Pleasant. Well developed, well nourished and in no acute distress.   HEENT: Normal.  Neck: Supple, no JVD, carotid bruits, or masses noted.  Cardiac: ***Regular rate and rhythm. No murmurs, rubs, or gallops. No edema.  Respiratory:  Lungs are clear to auscultation bilaterally with normal work of breathing.  GI: Soft and nontender.  MS: No deformity or atrophy. Gait and ROM intact.   Skin: Warm and dry. Color is normal.  Neuro:  Strength and sensation are intact and no gross focal deficits noted.  Psych: Alert, appropriate and with normal affect.   LABORATORY DATA:  EKG:  EKG {ACTION; IS/IS HOO:[id number]} ordered today. This demonstrates ***.  Lab Results  Component Value Date   WBC 4.0 01/28/2019   HGB 13.1 01/28/2019   HCT 39.8 01/28/2019   PLT 194 01/28/2019   GLUCOSE 92 01/28/2019   CHOL 87 10/07/2018   TRIG 60 10/07/2018   HDL 22 (L) 10/07/2018   LDLCALC 53 10/07/2018   ALT 9 01/23/2018   AST 22 01/23/2018   NA 141 01/28/2019   K 4.3 01/28/2019   CL 105 01/28/2019   CREATININE 0.99 01/28/2019   BUN 14 01/28/2019   CO2 25 01/28/2019   TSH 2.930 01/28/2019   INR 2.2 07/24/2019   HGBA1C 5.5 10/07/2018     BNP (last 3 results) No results for input(s): BNP in the last 8760 hours.  ProBNP (last 3 results) No results for input(s): PROBNP in the last 8760 hours.   Other Studies Reviewed Today:  Echo Study Conclusions 09/2018  - Study data: Rapid atrial fibrillation throughout exam. - Left ventricle: The cavity size was normal. Systolic function was normal. The estimated ejection fraction was in the range of 50% to 55%. Although no diagnostic regional wall motion abnormality was identified, this possibility cannot be completely excluded on the basis of this study. The study was not technically sufficient to allow evaluation of LV diastolic dysfunction due to atrial fibrillation. - Aortic valve: Valve mobility was moderately restricted. Transvalvular velocity was within the normal range. There was no stenosis. There was no regurgitation. - Mitral valve: Mildly thickened leaflets . Chordal calcification. Transvalvular velocity was within the normal range. There was no evidence for stenosis. There was moderate to severe regurgitation. - Left atrium: The atrium was severely dilated. - Right ventricle: The cavity size  was severely dilated. Wall thickness was normal. Systolic function was mildly to moderately reduced. RV systolic pressure (S, est): 57 mm Hg. - Right atrium: The atrium was massively dilated. - Tricuspid valve: Severely dilated annulus with incomplete tricuspid valve leaflet coaptation. Mildly thickened leaflets. There was severe regurgitation. - Pulmonic valve: There was moderate regurgitation. - Pulmonary arteries: Systolic pressure was severely increased. PA peak pressure: 57 mm Hg (S). - Inferior vena cava: The vessel was severely dilated. Respirophasic changes in dimension were absent, consistent with elevated central venous pressure. - Hepatic veins: The flow pattern showed systolic flow reversal, suggestive of severe tricuspid regurgitation. - Pericardium, extracardiac: A trivial pericardial effusion was identified.  Impressions:  - No grossly evident valvular vegetations.  Torrential tricuspid valve regurgitation with annular dilatation and lack of coaptation of tricuspid valve leaflets. Massive right atrial diatilation. Severe RV dilatation with mild-moderately reduced systolic function.  Moderate-severe mitral valve regurgutation. Severe left atrial dilatation. RVSP 57 mmHg, RA pressure estimate at least 20 mmHg. Moderate pulmonary valve regurgitation.  Compared to the exam from 11/28/2016, RV size appears larger and RV function is slightly reduced. This may account for drop in RVSP, suggesting worsening RV function in the setting of elevated right sided heart pressures. Side by side comparison of images performed.   Assessment/Plan:  1. Permanent atrial fibrillation - she is managed with rate control and anticoagulation with Coumadin. HR is controlled. Medicines refilled - now on Toprol XL with her CCB.   2.PriorCVA - refused switching to DOAC - she has elected to stay on coumadin. Cost would probably be big  factor.  3. HTN - BPlooks good - no changes made.   4. Prior left ventricular dysfunction but has improved - last echo from November noted with EF of 50%.   5. Chronic anticoagulation- needs lab today.   6. Weight loss - needs labs  7.COVID-19 Education: The signs and symptoms of COVID-19 were discussed with the patient and how to seek care for testing (follow up with PCP or arrange E-visit).  The importance of social distancing, staying at home, hand hygiene and wearing a mask when out in public were discussed today.  Current medicines are reviewed with the patient today.  The patient does not have concerns regarding medicines other than what has been noted above.  The following changes have been made:  See above.  Labs/ tests ordered today include:   No orders of the defined types were placed in this encounter.    Disposition:   FU with *** in {gen number [id number]}0-10:310397} {Days to years:10300}.   Patient is agreeable to this plan and will call if any problems develop in the interim.   SignedNorma Fredrickson: Datrell Dunton, NP  08/01/2019 7:42 PM  [facility] [address] [place], [place]  [address] Phone: [phone] Fax: [phone]

## 2019-08-05 ENCOUNTER — Ambulatory Visit: Payer: Medicare HMO | Admitting: Nurse Practitioner

## 2019-08-16 NOTE — Progress Notes (Signed)
CARDIOLOGY OFFICE NOTE  Date:  08/20/2019    Joanna [name] Date of Birth: May 23, 1941 Medical Record #[id number]  PCP:  Juluis Rainier, MD  Cardiologist:  Tyrone Sage    Chief Complaint  Patient presents with  . Follow-up    History of Present Illness: Joanna [name] is a 78 y.o. female who presents today for a 7 month check. Former patient of Dr. Pryor Curia Jordan's. Did notwish to transition to the [employer].   She has a past history of chronic permanent atrial fibrillation. She is a long-term warfarin. She's had a prior history of a middle cerebral artery CVA in 2012. She retired from work after that stroke. 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 Other issues include HTN, prior LV dysfunction.   Admitted back in [facility] 2016with recurrent stroke. Discharge summary reviewed - apparently did not wish to switch to DOAC. She remains on coumadin. Echo was updated again.Noted to beresistant to allowing family support.  She was seen as a work in by [employer], NP in 07/2018 with lower extremity edema. Lots of sodium use noted.   She was admitted back in November of 2019 - concern about worsening right sided hemiparesis and URI - thought to have recrudescence of prior stroke due to ongoing bronchitis. She had extensive neuro work up - no acute CVA. Was back to her baseline. She was quite deconditioned and SNF was recommended. Lopressor 100 mg was stopped and she was changed to Toprol 50.    I last saw her in March of this year - asking for a new PCP - had gotten back home after a stay in rehab - balance was an issue. Dose of Lopressor questionable as well as if she was even taking. She has wished to stay on Coumadin and not transition to DOAC therapy.   The patient does not have symptoms concerning for COVID-19 infection (fever, chills, cough, or new shortness of breath).   Comes in today.  Here with her daughter. Her balance remains poor but she has not fallen. No chest pain. Breathing has been ok. Swelling has been stable with her current regimen of Lasix. Needs flu shot. Has been staying in for the most part. No problems with her coumadin. They both feel like she has been doing ok. She has gained some weight back. She likes to stay up late at night and sleep in the mornings. Watching lots of TV.    Past Medical History:  Diagnosis Date  . Acute renal failure ([facility]) 02/23/2007-03/22/2007   Now with normalization back to baseline  . Aphasia 2012  . CHF (congestive heart failure) ([facility])   . Chronic atrial fibrillation ([facility])   . Chronic kidney disease   . Diverticulosis   . Dysnomia 12/2010  . Hypertension   . Ischemic bowel disease ([facility])    With GI bleeding  . Left ventricular dysfunction    EF is now 50-55% as of 2012  . Obesity   . Splenic infarct   . Stroke [facility]) February 2012   Now back on coumadin    Past Surgical History:  Procedure Laterality Date  . COLECTOMY  12/2010   partial  . ILEOSTOMY    . OMENTECTOMY     partial with ileostomy     Medications: Current Meds  Medication Sig  . diltiazem (CARTIA XT) 120 MG 24 hr capsule Take 1 capsule (120 mg total) by mouth daily.  Marland Kitchen  furosemide (LASIX) 40 MG tablet Take 1 tablet (40 mg total) by mouth every other day. Take 40 mg daily for 4 days, then take 40 mg every other day.  . metoprolol succinate (TOPROL-XL) 50 MG 24 hr tablet Take 1 tablet (50 mg total) by mouth daily.  Marland Kitchen. warfarin (COUMADIN) 5 MG tablet TAKE 1 TO 1 & 1/2 (ONE TO ONE & ONE-HALF) TABLETS BY MOUTH ONCE DAILY AS DIRECTED BY  COUMADIN  CLINIC     Allergies: No Known Allergies  Social History: The patient  reports that she has never smoked. She has never used smokeless tobacco. She reports that she does not drink alcohol or use drugs.   Family History: [name] family history includes Heart disease in her mother; Hypertension in her sister.    Review of Systems: Please see the history of present illness.   All other systems are reviewed and negative.   Physical Exam: VS:  BP 130/80   Pulse 72   Ht 5\' 3"  (1.6 m)   Wt 152 lb (68.9 kg)   SpO2 99%   BMI 26.93 kg/m  .  BMI Body mass index is 26.93 kg/m.  Wt Readings from Last 3 Encounters:  08/20/19 152 lb (68.9 kg)  01/28/19 143 lb 6.4 oz (65 kg)  10/06/18 161 lb 2.5 oz (73.1 kg)    General: Pleasant. Alert and in no acute distress.  She is in a wheelchair.  HEENT: Normal.  Neck: Supple, no JVD, carotid bruits, or masses noted.  Cardiac: Irregular rhythm. Her rate is ok. Systolic murmur noted.  No edema.  Respiratory:  Lungs are clear to auscultation bilaterally with normal work of breathing.  GI: Soft and nontender.  MS: No deformity or atrophy. Gait not tested. She is able to stand to weigh.  Skin: Warm and dry. Color is normal.  Neuro:  Strength and sensation are intact and no gross focal deficits noted.  Psych: Alert, appropriate and with normal affect.   LABORATORY DATA:  EKG:  EKG is not ordered today.   Lab Results  Component Value Date   WBC 4.0 01/28/2019   HGB 13.1 01/28/2019   HCT 39.8 01/28/2019   PLT 194 01/28/2019   GLUCOSE 92 01/28/2019   CHOL 87 10/07/2018   TRIG 60 10/07/2018   HDL 22 (L) 10/07/2018   LDLCALC 53 10/07/2018   ALT 9 01/23/2018   AST 22 01/23/2018   NA 141 01/28/2019   K 4.3 01/28/2019   CL 105 01/28/2019   CREATININE 0.99 01/28/2019   BUN 14 01/28/2019   CO2 25 01/28/2019   TSH 2.930 01/28/2019   INR 2.2 08/20/2019   HGBA1C 5.5 10/07/2018   Lab Results  Component Value Date   INR 2.2 08/20/2019   INR 2.2 07/24/2019   INR 4.0 (A) 06/14/2019     BNP (last 3 results) No results for input(s): BNP in the last 8760 hours.  ProBNP (last 3 results) No results for input(s): PROBNP in the last 8760 hours.   Other Studies Reviewed Today:  Echo Study Conclusions 09/2018  - Study data: Rapid atrial  fibrillation throughout exam. - Left ventricle: The cavity size was normal. Systolic function was normal. The estimated ejection fraction was in the range of 50% to 55%. Although no diagnostic regional wall motion abnormality was identified, this possibility cannot be completely excluded on the basis of this study. The study was not technically sufficient to allow evaluation of LV diastolic dysfunction due to atrial fibrillation. -  Aortic valve: Valve mobility was moderately restricted. Transvalvular velocity was within the normal range. There was no stenosis. There was no regurgitation. - Mitral valve: Mildly thickened leaflets . Chordal calcification. Transvalvular velocity was within the normal range. There was no evidence for stenosis. There was moderate to severe regurgitation. - Left atrium: The atrium was severely dilated. - Right ventricle: The cavity size was severely dilated. Wall thickness was normal. Systolic function was mildly to moderately reduced. RV systolic pressure (S, est): 57 mm Hg. - Right atrium: The atrium was massively dilated. - Tricuspid valve: Severely dilated annulus with incomplete tricuspid valve leaflet coaptation. Mildly thickened leaflets. There was severe regurgitation. - Pulmonic valve: There was moderate regurgitation. - Pulmonary arteries: Systolic pressure was severely increased. PA peak pressure: 57 mm Hg (S). - Inferior vena cava: The vessel was severely dilated. Respirophasic changes in dimension were absent, consistent with elevated central venous pressure. - Hepatic veins: The flow pattern showed systolic flow reversal, suggestive of severe tricuspid regurgitation. - Pericardium, extracardiac: A trivial pericardial effusion was identified.  Impressions:  - No grossly evident valvular vegetations.  Torrential tricuspid valve regurgitation with annular dilatation and lack of coaptation of  tricuspid valve leaflets. Massive right atrial diatilation. Severe RV dilatation with mild-moderately reduced systolic function. Moderate-severe mitral valve regurgutation. Severe left atrial dilatation. RVSP 57 mmHg, RA pressure estimate at least 20 mmHg. Moderate pulmonary valve regurgitation.  Compared to the exam from 11/28/2016, RV size appears larger and RV function is slightly reduced. This may account for drop in RVSP, suggesting worsening RV function in the setting of elevated right sided heart pressures. Side by side comparison of images performed.   Assessment/Plan:  1. Persistent AF - managed with rate control and anticoagulation with coumadin - doing ok. HR is stable. No changes made today.   2. Prior CVA - refused to switch to [place] which would probably be too costly for her.   3. HTN - BP is ok by me today. No changes made.   4. Prior LV dysfunction - improved by echo from 09/2018 with EF of 50%. She is on diuretic therapy. Symptoms are stable. Lab today.   5. Chronic anticoagulation - no falls - needs labs today.   6. Health maintenance - flu shot given today.   7. COVID-19 Education: The signs and symptoms of COVID-19 were discussed with the patient and how to seek care for testing (follow up with PCP or arrange E-visit).  The importance of social distancing, staying at home, hand hygiene and wearing a mask when out in public were discussed today.  Current medicines are reviewed with the patient today.  The patient does not have concerns regarding medicines other than what has been noted above.  The following changes have been made:  See above.  Labs/ tests ordered today include:   No orders of the defined types were placed in this encounter.    Disposition:   FU with me in about 6 months.    Patient is agreeable to this plan and will call if any problems develop in the interim.   SignedTruitt Merle, NP  08/20/2019 4:33 PM   [place] [address] [place], [place]  [address] Phone: [phone] Fax: [phone]

## 2019-08-20 ENCOUNTER — Encounter: Payer: Self-pay | Admitting: Nurse Practitioner

## 2019-08-20 ENCOUNTER — Other Ambulatory Visit: Payer: Self-pay | Admitting: *Deleted

## 2019-08-20 ENCOUNTER — Ambulatory Visit (INDEPENDENT_AMBULATORY_CARE_PROVIDER_SITE_OTHER): Payer: Medicare HMO | Admitting: Nurse Practitioner

## 2019-08-20 ENCOUNTER — Ambulatory Visit (INDEPENDENT_AMBULATORY_CARE_PROVIDER_SITE_OTHER): Payer: Medicare HMO | Admitting: *Deleted

## 2019-08-20 ENCOUNTER — Other Ambulatory Visit: Payer: Self-pay

## 2019-08-20 VITALS — BP 130/80 | HR 72 | Ht 63.0 in | Wt 152.0 lb

## 2019-08-20 DIAGNOSIS — I10 Essential (primary) hypertension: Secondary | ICD-10-CM

## 2019-08-20 DIAGNOSIS — Z23 Encounter for immunization: Secondary | ICD-10-CM

## 2019-08-20 DIAGNOSIS — Z7189 Other specified counseling: Secondary | ICD-10-CM | POA: Diagnosis not present

## 2019-08-20 DIAGNOSIS — I519 Heart disease, unspecified: Secondary | ICD-10-CM | POA: Diagnosis not present

## 2019-08-20 DIAGNOSIS — Z8673 Personal history of transient ischemic attack (TIA), and cerebral infarction without residual deficits: Secondary | ICD-10-CM

## 2019-08-20 DIAGNOSIS — I4821 Permanent atrial fibrillation: Secondary | ICD-10-CM | POA: Diagnosis not present

## 2019-08-20 DIAGNOSIS — I4891 Unspecified atrial fibrillation: Secondary | ICD-10-CM

## 2019-08-20 DIAGNOSIS — Z5181 Encounter for therapeutic drug level monitoring: Secondary | ICD-10-CM

## 2019-08-20 DIAGNOSIS — Z79899 Other long term (current) drug therapy: Secondary | ICD-10-CM

## 2019-08-20 LAB — POCT INR: INR: 2.2 (ref 2.0–3.0)

## 2019-08-20 MED ORDER — WARFARIN SODIUM 5 MG PO TABS: ORAL_TABLET | ORAL | 2 refills | Status: DC

## 2019-08-20 NOTE — Patient Instructions (Signed)
Description   Start taking 1.5 tablets every day except 1 tablet on Saturdays. Recheck INR in 2 weeks. Call Coumadin Clinic  [phone] with any changes

## 2019-08-20 NOTE — Addendum Note (Signed)
Addended by: Burtis Junes on: 08/20/2019 04:42 PM   Modules accepted: Orders

## 2019-08-20 NOTE — Patient Instructions (Addendum)
After Visit Summary:  We will be checking the following labs today - BMET, CBC, and HPF   Medication Instructions:    Continue with your current medicines.    If you need a refill on your cardiac medications before your next appointment, please call your pharmacy.     Testing/Procedures To Be Arranged:  N/A  Follow-Up:   See me in about 6 months.     At [facility], you and your health needs are our priority.  As part of our continuing mission to provide you with exceptional heart care, we have created designated Provider Care Teams.  These Care Teams include your primary Cardiologist (physician) and Advanced Practice Providers (APPs -  Physician Assistants and Nurse Practitioners) who all work together to provide you with the care you need, when you need it.  Special Instructions:  . Stay safe, stay home, wash your hands for at least 20 seconds and wear a mask when out in public.  . It was good to talk with you today.    Call the [place] office at [phone] if you have any questions, problems or concerns.

## 2019-08-21 LAB — CBC
Hematocrit: 37.9 % (ref 34.0–46.6)
Hemoglobin: 12.8 g/dL (ref 11.1–15.9)
MCH: 32.8 pg (ref 26.6–33.0)
MCHC: 33.8 g/dL (ref 31.5–35.7)
MCV: 97 fL (ref 79–97)
Platelets: 174 10*3/uL (ref 150–450)
RBC: 3.9 x10E6/uL (ref 3.77–5.28)
RDW: 12.3 % (ref 11.7–15.4)
WBC: 3.9 10*3/uL (ref 3.4–10.8)

## 2019-08-21 LAB — HEPATIC FUNCTION PANEL
ALT: 12 IU/L (ref 0–32)
AST: 25 IU/L (ref 0–40)
Albumin: 4.2 g/dL (ref 3.7–4.7)
Alkaline Phosphatase: 239 IU/L — ABNORMAL HIGH (ref 39–117)
Bilirubin Total: 0.8 mg/dL (ref 0.0–1.2)
Bilirubin, Direct: 0.42 mg/dL — ABNORMAL HIGH (ref 0.00–0.40)
Total Protein: 7.5 g/dL (ref 6.0–8.5)

## 2019-08-21 LAB — BASIC METABOLIC PANEL
BUN/Creatinine Ratio: 15 (ref 12–28)
BUN: 13 mg/dL (ref 8–27)
CO2: 25 mmol/L (ref 20–29)
Calcium: 9.5 mg/dL (ref 8.7–10.3)
Chloride: 104 mmol/L (ref 96–106)
Creatinine, Ser: 0.87 mg/dL (ref 0.57–1.00)
GFR calc Af Amer: 74 mL/min/{1.73_m2} (ref >59)
GFR calc non Af Amer: 64 mL/min/{1.73_m2} (ref >59)
Glucose: 81 mg/dL (ref 65–99)
Potassium: 4.8 mmol/L (ref 3.5–5.2)
Sodium: 140 mmol/L (ref 134–144)

## 2019-11-01 DIAGNOSIS — Z7901 Long term (current) use of anticoagulants: Secondary | ICD-10-CM | POA: Diagnosis not present

## 2019-11-01 DIAGNOSIS — I509 Heart failure, unspecified: Secondary | ICD-10-CM | POA: Diagnosis not present

## 2019-11-01 DIAGNOSIS — R69 Illness, unspecified: Secondary | ICD-10-CM | POA: Diagnosis not present

## 2019-11-01 DIAGNOSIS — Z87891 Personal history of nicotine dependence: Secondary | ICD-10-CM | POA: Diagnosis not present

## 2019-11-01 DIAGNOSIS — I4891 Unspecified atrial fibrillation: Secondary | ICD-10-CM | POA: Diagnosis not present

## 2019-11-01 DIAGNOSIS — I110 Hypertensive heart disease with heart failure: Secondary | ICD-10-CM | POA: Diagnosis not present

## 2019-11-01 DIAGNOSIS — Z8673 Personal history of transient ischemic attack (TIA), and cerebral infarction without residual deficits: Secondary | ICD-10-CM | POA: Diagnosis not present

## 2019-11-23 ENCOUNTER — Other Ambulatory Visit: Payer: Self-pay | Admitting: Cardiology

## 2019-12-10 ENCOUNTER — Other Ambulatory Visit: Payer: Self-pay

## 2019-12-10 ENCOUNTER — Ambulatory Visit (INDEPENDENT_AMBULATORY_CARE_PROVIDER_SITE_OTHER): Payer: Medicare HMO | Admitting: Pharmacist

## 2019-12-10 DIAGNOSIS — I4891 Unspecified atrial fibrillation: Secondary | ICD-10-CM | POA: Diagnosis not present

## 2019-12-10 DIAGNOSIS — Z5181 Encounter for therapeutic drug level monitoring: Secondary | ICD-10-CM | POA: Diagnosis not present

## 2019-12-10 LAB — POCT INR: INR: 3.5 — AB (ref 2.0–3.0)

## 2019-12-10 NOTE — Patient Instructions (Signed)
Start taking 1.5 tablets every day except 1 tablet on Saturdays. Recheck INR in 3 weeks. Call Coumadin Clinic  #[phone] with any changes

## 2019-12-27 ENCOUNTER — Other Ambulatory Visit: Payer: Self-pay | Admitting: Nurse Practitioner

## 2019-12-27 ENCOUNTER — Other Ambulatory Visit: Payer: Self-pay | Admitting: Cardiology

## 2019-12-30 ENCOUNTER — Other Ambulatory Visit: Payer: Self-pay | Admitting: Cardiology

## 2020-01-01 ENCOUNTER — Other Ambulatory Visit: Payer: Self-pay | Admitting: Cardiology

## 2020-01-07 ENCOUNTER — Telehealth: Payer: Self-pay | Admitting: Nurse Practitioner

## 2020-01-07 ENCOUNTER — Other Ambulatory Visit: Payer: Self-pay

## 2020-01-07 ENCOUNTER — Ambulatory Visit (INDEPENDENT_AMBULATORY_CARE_PROVIDER_SITE_OTHER): Payer: Medicare Other | Admitting: *Deleted

## 2020-01-07 DIAGNOSIS — I4891 Unspecified atrial fibrillation: Secondary | ICD-10-CM | POA: Diagnosis not present

## 2020-01-07 DIAGNOSIS — Z5181 Encounter for therapeutic drug level monitoring: Secondary | ICD-10-CM | POA: Diagnosis not present

## 2020-01-07 LAB — POCT INR: INR: 2.6 (ref 2.0–3.0)

## 2020-01-07 NOTE — Telephone Encounter (Signed)
No message needed °

## 2020-01-07 NOTE — Patient Instructions (Addendum)
Description   Continue taking 1.5 tablets every day except 1 tablet on Saturdays. Recheck INR in 4 weeks. Call Coumadin Clinic  #[phone] with any changes

## 2020-01-29 ENCOUNTER — Other Ambulatory Visit: Payer: Self-pay | Admitting: Cardiology

## 2020-02-04 ENCOUNTER — Telehealth: Payer: Self-pay | Admitting: Nurse Practitioner

## 2020-02-04 NOTE — Telephone Encounter (Signed)
Patient called to confirm appt today with the coumadin clinic at 3:45pm

## 2020-02-12 NOTE — Progress Notes (Addendum)
CARDIOLOGY OFFICE NOTE  Date:  02/18/2020    Joanna [name] Date of Birth: 1941-07-09 Medical Record #[id number]  PCP:  Leighton Ruff, MD  Cardiologist:  Servando Snare   Chief Complaint  Patient presents with  . Follow-up    History of Present Illness: Joanna [name] is a 79 y.o. female who presents today for a follow up visit. Former patient of Dr. Bjorn Pippin Jordan's. Did notwish to transition to the [employer].   She has a past history of chronic permanent atrial fibrillation. She is a long-term warfarin. She's had a prior history of a middle cerebral artery CVA in 2012. She retired from work after that stroke. 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 Other issues include HTN, prior LV dysfunction.   Admitted back in [facility] 2016with recurrent stroke. Discharge summary reviewed - apparently did not wish to switch to [place]. Remained on coumadin. Echo was updated again.Noted to beresistant to allowing family support.  She was seen as a work in by [employer], NP in 07/2018 with lower extremity edema. Lots of sodium use noted.  She was admitted back in November of 2019 - concern about worsening right sided hemiparesis and URI - thought to have recrudescence of prior stroke due to ongoing bronchitis. She had extensive neuro work up - no acute CVA. Was back to her baseline. She was quite deconditioned and SNF was recommended. She has continued to opt for Coumadin.   Last seen by me back in October - balance remains poor. No chest pain. Labs showed alk phos to be quite elevated - referred and encouraged her to go back to PCP for evaluation.  The patient does not have symptoms concerning for COVID-19 infection (fever, chills, cough, or new shortness of breath).   Comes in today. Here with her daughter. They both provide the history. Sounds like she is getting new primary care. Not sure of the name  of the provider. Says there was concern about her heart?? She did not see her prior PCP about the elevated alk phos. Asking again what this means. Needs labs today. Sounds like she has had recent UTI - still with lots of urinary frequency - urine looks dark. She notes that her Diltiazem was refilled - looks different than what she had before - this one makes her feel worse - makes her head hurt - her stomach hurt, etc. She wants to take the previous one - says that one worked for her. No bleeding. Has gained some weight. Some lower extremity edema. Unclear about her salt use. Will have some shortness of breath with exertion. She has had one COVID vaccine.   Past Medical History:  Diagnosis Date  . Acute renal failure ([place]) 02/23/2007-03/22/2007   Now with normalization back to baseline  . Aphasia 2012  . CHF (congestive heart failure) ([place])   . Chronic atrial fibrillation ([place])   . Chronic kidney disease   . Diverticulosis   . Dysnomia 12/2010  . Hypertension   . Ischemic bowel disease ([place])    With GI bleeding  . Left ventricular dysfunction    EF is now 50-55% as of 2012  . Obesity   . Splenic infarct   . Stroke [facility]) February 2012   Now back on coumadin    Past Surgical History:  Procedure Laterality Date  . COLECTOMY  12/2010   partial  . ILEOSTOMY    . OMENTECTOMY  partial with ileostomy     Medications: Current Meds  Medication Sig  . furosemide (LASIX) 40 MG tablet Take 1 tablet (40 mg total) by mouth every other day. Take 40 mg daily for 4 days, then take 40 mg every other day.  . metoprolol succinate (TOPROL-XL) 50 MG 24 hr tablet Take 1 tablet (50 mg total) by mouth daily.  Marland Kitchen warfarin (COUMADIN) 5 MG tablet TAKE 1 TO 1 & 1/2 (ONE & ONE-HALF) TABLET BY MOUTH ONCE DAILY USE AS DIRECTED BY  COUMADIN  CLINIC  . [DISCONTINUED] diltiazem (CARDIZEM CD) 120 MG 24 hr capsule Take 1 capsule by mouth once daily     Allergies: No Known Allergies  Social History: The  patient  reports that she has never smoked. She has never used smokeless tobacco. She reports that she does not drink alcohol or use drugs.   Family History: [name] family history includes Heart disease in her mother; Hypertension in her sister.   Review of Systems: Please see the history of present illness.   All other systems are reviewed and negative.   Physical Exam: VS:  BP 124/78   Pulse 81   Ht '5\' 3"'  (1.6 m)   Wt 159 lb (72.1 kg)   SpO2 97%   BMI 28.17 kg/m  .  BMI Body mass index is 28.17 kg/m.  Wt Readings from Last 3 Encounters:  02/18/20 159 lb (72.1 kg)  08/20/19 152 lb (68.9 kg)  01/28/19 143 lb 6.4 oz (65 kg)    General: Pleasant. Elderly. Alert and in no acute distress. Her weight is up.   HEENT: Normal.  Neck: Supple, no JVD, carotid bruits, or masses noted.  Cardiac: Irregular rhythm. Rate is ok. She has an outflow murmur. 1+ edema.  Respiratory:  Lungs are fairlyclear to auscultation bilaterally with normal work of breathing.  GI: Soft and nontender.  MS: No deformity or atrophy. Gait not tested.  Skin: Warm and dry. Color is normal.  Neuro:  Strength and sensation are intact and no gross focal deficits noted.  Psych: Alert, appropriate and with normal affect.   LABORATORY DATA:  EKG:  EKG is ordered today.  Personally reviewed by me. This demonstrates AF with controlled VR - HR today is 81.  Lab Results  Component Value Date   WBC 3.9 08/20/2019   HGB 12.8 08/20/2019   HCT 37.9 08/20/2019   PLT 174 08/20/2019   GLUCOSE 81 08/20/2019   CHOL 87 10/07/2018   TRIG 60 10/07/2018   HDL 22 (L) 10/07/2018   LDLCALC 53 10/07/2018   ALT 12 08/20/2019   AST 25 08/20/2019   NA 140 08/20/2019   K 4.8 08/20/2019   CL 104 08/20/2019   CREATININE 0.87 08/20/2019   BUN 13 08/20/2019   CO2 25 08/20/2019   TSH 2.930 01/28/2019   INR 3.6 (A) 02/18/2020   HGBA1C 5.5 10/07/2018     BNP (last 3 results) No results for input(s): BNP in the last 8760  hours.  ProBNP (last 3 results) No results for input(s): PROBNP in the last 8760 hours.   Other Studies Reviewed Today:  EchoStudy Conclusions11/2019  - Study data: Rapid atrial fibrillation throughout exam. - Left ventricle: The cavity size was normal. Systolic function was normal. The estimated ejection fraction was in the range of 50% to 55%. Although no diagnostic regional wall motion abnormality was identified, this possibility cannot be completely excluded on the basis of this study. The study was not technically  sufficient to allow evaluation of LV diastolic dysfunction due to atrial fibrillation. - Aortic valve: Valve mobility was moderately restricted. Transvalvular velocity was within the normal range. There was no stenosis. There was no regurgitation. - Mitral valve: Mildly thickened leaflets . Chordal calcification. Transvalvular velocity was within the normal range. There was no evidence for stenosis. There was moderate to severe regurgitation. - Left atrium: The atrium was severely dilated. - Right ventricle: The cavity size was severely dilated. Wall thickness was normal. Systolic function was mildly to moderately reduced. RV systolic pressure (S, est): 57 mm Hg. - Right atrium: The atrium was massively dilated. - Tricuspid valve: Severely dilated annulus with incomplete tricuspid valve leaflet coaptation. Mildly thickened leaflets. There was severe regurgitation. - Pulmonic valve: There was moderate regurgitation. - Pulmonary arteries: Systolic pressure was severely increased. PA peak pressure: 57 mm Hg (S). - Inferior vena cava: The vessel was severely dilated. Respirophasic changes in dimension were absent, consistent with elevated central venous pressure. - Hepatic veins: The flow pattern showed systolic flow reversal, suggestive of severe tricuspid regurgitation. - Pericardium, extracardiac: A trivial pericardial  effusion was identified.  Impressions:  - No grossly evident valvular vegetations.  Torrential tricuspid valve regurgitation with annular dilatation and lack of coaptation of tricuspid valve leaflets. Massive right atrial diatilation. Severe RV dilatation with mild-moderately reduced systolic function. Moderate-severe mitral valve regurgutation. Severe left atrial dilatation. RVSP 57 mmHg, RA pressure estimate at least 20 mmHg. Moderate pulmonary valve regurgitation.  Compared to the exam from 11/28/2016, RV size appears larger and RV function is slightly reduced. This may account for drop in RVSP, suggesting worsening RV function in the setting of elevated right sided heart pressures. Side by side comparison of images performed.   Assessment/Plan:  1. Persistent AF - rate is ok - remains on her Warfarin - checking INR today. Needs surveillance labs.   2. Prior CVA - has not wished to switch over to [place] therapy.   3. HTN - BP is fair today.   4. Valvular heart disease - will get her echo updated. Unclear how much her symptoms are related to her cardiac situation or just her overall condition. All in all, probably just conservative management.   5. Prior LV dysfunction - had improved by echo from 2019 - remains on chronic diuretic - not on ideal therapy. Overall compliance unclear to me.   6. Chronic anticoagulation  7. Elevated alk phos - recheck her lab today - she did not follow up - explained the importance of this again.   8. Prior UTI - still with some symptoms - will recheck urine today.   9. COVID-19 Education: The signs and symptoms of COVID-19 were discussed with the patient and how to seek care for testing (follow up with PCP or arrange E-visit).  The importance of social distancing, staying at home, hand hygiene and wearing a mask when out in public were discussed today.  Current medicines are reviewed with the patient today.  The  patient does not have concerns regarding medicines other than what has been noted above.  The following changes have been made:  See above.  Labs/ tests ordered today include:    Orders Placed This Encounter  Procedures  . Urine Culture  . Urinalysis, Routine w reflex microscopic  . Comprehensive metabolic panel  . CBC  . Hepatic function panel  . EKG 12-Lead  . ECHOCARDIOGRAM COMPLETE     Disposition:   FU with me in 6 months.  Patient is agreeable to this plan and will call if any problems develop in the interim.   SignedTruitt Merle, NP  02/18/2020 4:35 PM  [place] [address] [place], [place]  [address] Phone: [phone] Fax: [phone]      Addendum: 05/13/20  Sherren Mocha, MD sent to Burtis Junes, NP; Leighton Ruff, MD Bary Castilla - thanks for referring Ms Rosana Hoes. See note attached. She prefers conservative care which is reasonable I think. Let's touch base again after her 6 month echo. Thx Ronalee Belts

## 2020-02-18 ENCOUNTER — Ambulatory Visit: Payer: Medicare Other

## 2020-02-18 ENCOUNTER — Other Ambulatory Visit: Payer: Self-pay

## 2020-02-18 ENCOUNTER — Ambulatory Visit: Payer: Medicare Other | Admitting: Nurse Practitioner

## 2020-02-18 ENCOUNTER — Encounter: Payer: Self-pay | Admitting: Nurse Practitioner

## 2020-02-18 VITALS — BP 124/78 | HR 81 | Ht 63.0 in | Wt 159.0 lb

## 2020-02-18 DIAGNOSIS — Z5181 Encounter for therapeutic drug level monitoring: Secondary | ICD-10-CM

## 2020-02-18 DIAGNOSIS — I38 Endocarditis, valve unspecified: Secondary | ICD-10-CM

## 2020-02-18 DIAGNOSIS — I4821 Permanent atrial fibrillation: Secondary | ICD-10-CM | POA: Diagnosis not present

## 2020-02-18 DIAGNOSIS — I4891 Unspecified atrial fibrillation: Secondary | ICD-10-CM

## 2020-02-18 DIAGNOSIS — Z79899 Other long term (current) drug therapy: Secondary | ICD-10-CM | POA: Diagnosis not present

## 2020-02-18 DIAGNOSIS — R300 Dysuria: Secondary | ICD-10-CM

## 2020-02-18 LAB — POCT INR: INR: 3.6 — AB (ref 2.0–3.0)

## 2020-02-18 MED ORDER — DILTIAZEM HCL ER COATED BEADS 120 MG PO CP24: 120.0000 mg | ORAL_CAPSULE | Freq: Every day | ORAL | 3 refills | Status: DC

## 2020-02-18 NOTE — Patient Instructions (Addendum)
After Visit Summary:  We will be checking the following labs today - Urine, CMET, HPF and CBC   Medication Instructions:    Continue with your current medicines.   I have sent a new RX for the Diltiazem - take the old bottle (tell them this is the one you liked)   If you need a refill on your cardiac medications before your next appointment, please call your pharmacy.     Testing/Procedures To Be Arranged:  Echocardiogram  Follow-Up:   See me in 6 months    At [facility], you and your health needs are our priority.  As part of our continuing mission to provide you with exceptional heart care, we have created designated Provider Care Teams.  These Care Teams include your primary Cardiologist (physician) and Advanced Practice Providers (APPs -  Physician Assistants and Nurse Practitioners) who all work together to provide you with the care you need, when you need it.  Special Instructions:  . Stay safe, stay home, wash your hands for at least 20 seconds and wear a mask when out in public.  . It was good to talk with you today.    Call the [facility] office at [phone] if you have any questions, problems or concerns.

## 2020-02-18 NOTE — Patient Instructions (Signed)
Description   Take 1 tablet today, then resume same dosage 1.5 tablets every day except 1 tablet on Saturdays. Recheck INR in 4 weeks. Call Coumadin Clinic  #[phone] with any changes

## 2020-02-19 LAB — COMPREHENSIVE METABOLIC PANEL
ALT: 7 IU/L (ref 0–32)
AST: 23 IU/L (ref 0–40)
Albumin/Globulin Ratio: 1.3 (ref 1.2–2.2)
Albumin: 4 g/dL (ref 3.7–4.7)
Alkaline Phosphatase: 213 IU/L — ABNORMAL HIGH (ref 39–117)
BUN/Creatinine Ratio: 13 (ref 12–28)
BUN: 13 mg/dL (ref 8–27)
Bilirubin Total: 1 mg/dL (ref 0.0–1.2)
CO2: 23 mmol/L (ref 20–29)
Calcium: 9 mg/dL (ref 8.7–10.3)
Chloride: 108 mmol/L — ABNORMAL HIGH (ref 96–106)
Creatinine, Ser: 1.01 mg/dL — ABNORMAL HIGH (ref 0.57–1.00)
GFR calc Af Amer: 61 mL/min/{1.73_m2} (ref >59)
GFR calc non Af Amer: 53 mL/min/{1.73_m2} — ABNORMAL LOW (ref >59)
Globulin, Total: 3.2 g/dL (ref 1.5–4.5)
Glucose: 92 mg/dL (ref 65–99)
Potassium: 4.5 mmol/L (ref 3.5–5.2)
Sodium: 143 mmol/L (ref 134–144)
Total Protein: 7.2 g/dL (ref 6.0–8.5)

## 2020-02-19 LAB — CBC
Hematocrit: 38.1 % (ref 34.0–46.6)
Hemoglobin: 12.4 g/dL (ref 11.1–15.9)
MCH: 32.8 pg (ref 26.6–33.0)
MCHC: 32.5 g/dL (ref 31.5–35.7)
MCV: 101 fL — ABNORMAL HIGH (ref 79–97)
Platelets: 153 10*3/uL (ref 150–450)
RBC: 3.78 x10E6/uL (ref 3.77–5.28)
RDW: 11.7 % (ref 11.7–15.4)
WBC: 3.7 10*3/uL (ref 3.4–10.8)

## 2020-02-19 LAB — HEPATIC FUNCTION PANEL: Bilirubin, Direct: 0.52 mg/dL — ABNORMAL HIGH (ref 0.00–0.40)

## 2020-02-21 ENCOUNTER — Telehealth: Payer: Self-pay | Admitting: Pharmacist

## 2020-02-21 ENCOUNTER — Other Ambulatory Visit: Payer: Self-pay | Admitting: Nurse Practitioner

## 2020-02-21 LAB — URINALYSIS, ROUTINE W REFLEX MICROSCOPIC
Bilirubin, UA: NEGATIVE
Glucose, UA: NEGATIVE
Ketones, UA: NEGATIVE
Leukocytes,UA: NEGATIVE
Nitrite, UA: NEGATIVE
Protein,UA: NEGATIVE
RBC, UA: NEGATIVE
Specific Gravity, UA: 1.02 (ref 1.005–1.030)
Urobilinogen, Ur: 1 mg/dL (ref 0.2–1.0)
pH, UA: 5 (ref 5.0–7.5)

## 2020-02-21 LAB — URINE CULTURE

## 2020-02-21 MED ORDER — CIPROFLOXACIN HCL 250 MG PO TABS: 250.0000 mg | ORAL_TABLET | Freq: Two times a day (BID) | ORAL | 0 refills | Status: AC

## 2020-02-21 NOTE — Telephone Encounter (Signed)
Patient to take cipro for 3 days. Since pt INR was on the higher side a few days ago I have called patient and advised her to eat a little more greens for the next few days. Will keep follow up appointment in May. Patient appreciative of the call.

## 2020-03-04 ENCOUNTER — Other Ambulatory Visit: Payer: Self-pay | Admitting: Cardiology

## 2020-03-17 ENCOUNTER — Ambulatory Visit (INDEPENDENT_AMBULATORY_CARE_PROVIDER_SITE_OTHER): Payer: Medicare Other | Admitting: *Deleted

## 2020-03-17 ENCOUNTER — Ambulatory Visit (HOSPITAL_COMMUNITY): Payer: Medicare Other | Attending: Cardiology

## 2020-03-17 ENCOUNTER — Other Ambulatory Visit: Payer: Self-pay

## 2020-03-17 DIAGNOSIS — I4891 Unspecified atrial fibrillation: Secondary | ICD-10-CM

## 2020-03-17 DIAGNOSIS — Z5181 Encounter for therapeutic drug level monitoring: Secondary | ICD-10-CM | POA: Diagnosis not present

## 2020-03-17 DIAGNOSIS — I38 Endocarditis, valve unspecified: Secondary | ICD-10-CM

## 2020-03-17 LAB — POCT INR: INR: 4.9 — AB (ref 2.0–3.0)

## 2020-03-17 NOTE — Patient Instructions (Signed)
Description   Hold warfarin today and tomorrow, then start taking 1.5 tablets daily except for 1 tablet on Tuesday and Saturday. Recheck INR in 2 weeks. Call Coumadin Clinic  #[phone] with any changes

## 2020-03-23 ENCOUNTER — Telehealth: Payer: Self-pay | Admitting: Nurse Practitioner

## 2020-03-23 NOTE — Telephone Encounter (Signed)
      I went in pt's chart to see who called pt today 

## 2020-03-31 ENCOUNTER — Other Ambulatory Visit: Payer: Self-pay

## 2020-03-31 ENCOUNTER — Ambulatory Visit: Payer: Medicare Other | Admitting: *Deleted

## 2020-03-31 DIAGNOSIS — Z5181 Encounter for therapeutic drug level monitoring: Secondary | ICD-10-CM

## 2020-03-31 DIAGNOSIS — I4891 Unspecified atrial fibrillation: Secondary | ICD-10-CM

## 2020-03-31 LAB — POCT INR: INR: 3.3 — AB (ref 2.0–3.0)

## 2020-03-31 MED ORDER — WARFARIN SODIUM 5 MG PO TABS: ORAL_TABLET | ORAL | 0 refills | Status: DC

## 2020-03-31 NOTE — Patient Instructions (Signed)
Description   Continue taking 1.5 tablets daily except for 1 tablet on Saturdays. Recheck INR in 3 weeks. Call Coumadin Clinic  #[phone] with any changes

## 2020-04-06 ENCOUNTER — Institutional Professional Consult (permissible substitution): Payer: Medicare Other | Admitting: Cardiovascular Disease

## 2020-04-17 ENCOUNTER — Institutional Professional Consult (permissible substitution): Payer: Medicare Other | Admitting: Cardiovascular Disease

## 2020-04-22 ENCOUNTER — Other Ambulatory Visit: Payer: Self-pay

## 2020-04-22 ENCOUNTER — Ambulatory Visit (INDEPENDENT_AMBULATORY_CARE_PROVIDER_SITE_OTHER): Payer: Medicare Other | Admitting: *Deleted

## 2020-04-22 ENCOUNTER — Telehealth: Payer: Self-pay

## 2020-04-22 DIAGNOSIS — Z5181 Encounter for therapeutic drug level monitoring: Secondary | ICD-10-CM | POA: Diagnosis not present

## 2020-04-22 DIAGNOSIS — I4891 Unspecified atrial fibrillation: Secondary | ICD-10-CM | POA: Diagnosis not present

## 2020-04-22 LAB — POCT INR: INR: 4.9 — AB (ref 2.0–3.0)

## 2020-04-22 NOTE — Patient Instructions (Signed)
Description   Do not take anyy Warfarin today and No Warfarin tomorrow then continue taking 1.5 tablets daily except for 1 tablet on Saturdays. Recheck INR in 2 weeks. Call Coumadin Clinic  #[phone] with any changes

## 2020-04-22 NOTE — Telephone Encounter (Signed)
The patient no-showed to her consult with [name] 6/4. She was rescheduled incorrectly. Called to arrange consult.  Left message to call back.

## 2020-04-23 NOTE — Telephone Encounter (Signed)
The patient has been rescheduled to 6/25 at 3:40PM with [name]. She understands to arrive by 3:20PM for this visit. She was grateful for assistance.

## 2020-04-24 ENCOUNTER — Other Ambulatory Visit: Payer: Self-pay | Admitting: Nurse Practitioner

## 2020-05-06 ENCOUNTER — Ambulatory Visit (INDEPENDENT_AMBULATORY_CARE_PROVIDER_SITE_OTHER): Payer: Medicare Other

## 2020-05-06 ENCOUNTER — Other Ambulatory Visit: Payer: Self-pay

## 2020-05-06 DIAGNOSIS — Z5181 Encounter for therapeutic drug level monitoring: Secondary | ICD-10-CM

## 2020-05-06 DIAGNOSIS — I4891 Unspecified atrial fibrillation: Secondary | ICD-10-CM

## 2020-05-06 LAB — POCT INR: INR: 3.5 — AB (ref 2.0–3.0)

## 2020-05-06 NOTE — Patient Instructions (Signed)
Continue taking 1.5 tablets daily except for 1 tablet on Saturdays. Recheck INR in 4 weeks. Call Coumadin Clinic  #[phone] with any changes

## 2020-05-08 ENCOUNTER — Ambulatory Visit (INDEPENDENT_AMBULATORY_CARE_PROVIDER_SITE_OTHER): Payer: Medicare Other | Admitting: Cardiovascular Disease

## 2020-05-08 ENCOUNTER — Encounter: Payer: Self-pay | Admitting: Cardiovascular Disease

## 2020-05-08 ENCOUNTER — Other Ambulatory Visit: Payer: Self-pay

## 2020-05-08 VITALS — BP 130/70 | HR 70 | Ht 63.0 in | Wt 155.4 lb

## 2020-05-08 DIAGNOSIS — I340 Nonrheumatic mitral (valve) insufficiency: Secondary | ICD-10-CM | POA: Diagnosis not present

## 2020-05-08 NOTE — Progress Notes (Signed)
Cardiology Office Note:    Date:  05/13/2020   ID:  Joanna [name], DOB 1941-04-17, MRN [id number]  PCP:  Juluis Rainier, MD  [facility] Cardiologist:  Peter Swazil[place], MD  [facility] Electrophysiologist:  None   Referring MD: Juluis Rainier, MD   Chief Complaint  Patient presents with  . Mitral Regurgitation    History of Present Illness:    Joanna [name] is a 79 y.o. female with a hx of permanent atrial fibrillation, presenting for evaluation of severe mitral regurgitation, referred by Norma Fredrickson.  The patient has been on long-term warfarin and has had atrial fibrillation for many years.  The patient is here with her daughter today. She provides many of the details of her history. She reports that her mother was first diagnosed with atrial fibrillation when she presented with a stroke in 2008, and that she has had a total of 4 strokes. She remains remarkably independent after her strokes. She is here in a wheelchair today, but reports that she walks on her own without an aide. She is able to cook and does a little bit of light housework. Her daughter reports that the patient is dyspneic with activity, but the patient denies problems with this. She denies chest pain or pressure. Occasional lightheadedness but no syncope.  The patient has no other complaints.  She generally feels well.  Past Medical History:  Diagnosis Date  . Acute renal failure ([facility]) 02/23/2007-03/22/2007   Now with normalization back to baseline  . Aphasia 2012  . CHF (congestive heart failure) ([facility])   . Chronic atrial fibrillation ([facility])   . Chronic kidney disease   . Diverticulosis   . Dysnomia 12/2010  . Hypertension   . Ischemic bowel disease ([facility])    With GI bleeding  . Left ventricular dysfunction    EF is now 50-55% as of 2012  . Obesity   . Splenic infarct   . Stroke [facility]) February 2012   Now back on coumadin    Past Surgical History:  Procedure Laterality Date  . COLECTOMY   12/2010   partial  . ILEOSTOMY    . OMENTECTOMY     partial with ileostomy    Current Medications: Current Meds  Medication Sig  . diltiazem (CARDIZEM CD) 120 MG 24 hr capsule Take 1 capsule (120 mg total) by mouth daily.  . furosemide (LASIX) 40 MG tablet Take 1 tablet (40 mg total) by mouth every other day. Take 40 mg daily for 4 days, then take 40 mg every other day.  . metoprolol succinate (TOPROL-XL) 50 MG 24 hr tablet Take 1 tablet by mouth once daily  . [DISCONTINUED] warfarin (COUMADIN) 5 MG tablet TAKE 1 TO 1 & 1/2 (ONE & ONE-HALF) TABLETS BY MOUTH ONCE DAILY. USE AS DIRECTED BY COUMADIN CLINIC     Allergies:   Patient has no known allergies.   Social History   Socioeconomic History  . Marital status: Widowed    Spouse name: Not on file  . Number of children: 3  . Years of education: Not on file  . Highest education level: Not on file  Occupational History    Employer: RETIRED  Tobacco Use  . Smoking status: Never Smoker  . Smokeless tobacco: Never Used  Vaping Use  . Vaping Use: Never used  Substance and Sexual Activity  . Alcohol use: No  . Drug use: No  . Sexual activity: Not on file  Other Topics Concern  . Not on  file  Social History Narrative  . Not on file   Social Determinants of Health   Financial Resource Strain:   . Difficulty of Paying Living Expenses:   Food Insecurity:   . Worried About Charity fundraiser in the Last Year:   . Arboriculturist in the Last Year:   Transportation Needs:   . Film/video editor (Medical):   Marland Kitchen Lack of Transportation (Non-Medical):   Physical Activity:   . Days of Exercise per Week:   . Minutes of Exercise per Session:   Stress:   . Feeling of Stress :   Social Connections:   . Frequency of Communication with Friends and Family:   . Frequency of Social Gatherings with Friends and Family:   . Attends Religious Services:   . Active Member of Clubs or Organizations:   . Attends Archivist  Meetings:   Marland Kitchen Marital Status:      Family History: [name] family history includes Heart disease in her mother; Hypertension in her sister.  ROS:   Please see the history of present illness.    All other systems reviewed and are negative.  EKGs/Labs/Other Studies Reviewed:    The following studies were reviewed today: Echo 03/17/2020: IMPRESSIONS    1. Left ventricular ejection fraction, by estimation, is 50 to 55%. The  left ventricle has low normal function. The left ventricle demonstrates  global hypokinesis. There is mild left ventricular hypertrophy. Left  ventricular diastolic parameters are  indeterminate.  2. Right ventricular systolic function is normal. The right ventricular  size is moderately enlarged. There is mildly elevated pulmonary artery  systolic pressure.  3. Left atrial size was severely dilated.  4. Right atrial size was severely dilated.  5. The mitral valve is normal in structure. Severe mitral valve  regurgitation. No evidence of mitral stenosis.  6. Tricuspid valve regurgitation is severe.  7. The aortic valve is normal in structure. Aortic valve regurgitation is  not visualized. No aortic stenosis is present.  8. Pulmonic valve regurgitation is moderate.  9. The inferior vena cava is dilated in size with <50% respiratory  variability, suggesting right atrial pressure of 15 mmHg.   EKG:  EKG is not ordered today.    Recent Labs: 02/18/2020: ALT 7; BUN 13; Creatinine, Ser 1.01; Hemoglobin 12.4; Platelets 153; Potassium 4.5; Sodium 143  Recent Lipid Panel    Component Value Date/Time   CHOL 87 10/07/2018 0546   TRIG 60 10/07/2018 0546   HDL 22 (L) 10/07/2018 0546   CHOLHDL 4.0 10/07/2018 0546   VLDL 12 10/07/2018 0546   LDLCALC 53 10/07/2018 0546    Physical Exam:    VS:  BP 130/70   Pulse 70   Ht 5\' 3"  (1.6 m)   Wt 155 lb 6.4 oz (70.5 kg)   SpO2 97%   BMI 27.53 kg/m     Wt Readings from Last 3 Encounters:  05/08/20 155  lb 6.4 oz (70.5 kg)  02/18/20 159 lb (72.1 kg)  08/20/19 152 lb (68.9 kg)     GEN:  Well nourished, well developed in no acute distress HEENT: Normal NECK: No JVD; No carotid bruits LYMPHATICS: No lymphadenopathy CARDIAC: Irregular, 3/6 holosystolic murmur at the left lower sternal border and apex RESPIRATORY:  Clear to auscultation without rales, wheezing or rhonchi  ABDOMEN: Soft, non-tender, non-distended MUSCULOSKELETAL:  No edema; No deformity  SKIN: Warm and dry NEUROLOGIC:  Alert and oriented x 3 PSYCHIATRIC:  Normal  affect   ASSESSMENT:    1. Severe mitral regurgitation    PLAN:    In order of problems listed above:  1. The patient has severe, stage D1 mitral valve and tricuspid valve regurgitation.  She has functional limitation related to her age, but probably has New York Heart Association functional class IIb symptoms.  I have personally reviewed her echocardiogram which demonstrates fairly marked biatrial enlargement as well as severe central mitral and tricuspid regurgitation.  The patient has mild to moderate global LV systolic dysfunction considering the presence of severe mitral regurgitation with LVEF in the range of 50 to 55%.  I reviewed the natural history of mitral regurgitation with the patient, specifically the importance of LV dysfunction is an indication for intervention.  The patient and her daughter are counseled at length.  They understand that further evaluation would include a transesophageal echocardiogram to better define the functional anatomy of the patient's mitral and tricuspid valves as well as a right and left heart catheterization to evaluate for the presence of coronary artery disease and to assess hemodynamics.  The patient is not inclined to proceed with further evaluation or treatment at this point as she feels minimally symptomatic.  I think at a minimum we should continue with close surveillance.  I have recommended a repeat echocardiogram in 6  months.  I agree that she clearly has a component of functional limitation related to her prior strokes and advanced age.  It is reasonable to continue with surveillance program at this time.  We will follow up after her echocardiogram in 6 months and she will continue to follow closely with Norma Fredrickson, NP.    Medication Adjustments/Labs and Tests Ordered: Current medicines are reviewed at length with the patient today.  Concerns regarding medicines are outlined above.  No orders of the defined types were placed in this encounter.  No orders of the defined types were placed in this encounter.   Patient Instructions  Medication Instructions:  Your provider recommends that you continue on your current medications as directed. Please refer to the Current Medication list given to you today.  *If you need a refill on your cardiac medications before your next appointment, please call your pharmacy*  Testing/Procedures: Your provider has requested that you have an echocardiogram in 6 months. Echocardiography is a painless test that uses sound waves to create images of your heart. It provides your doctor with information about the size and shape of your heart and how well your heart's chambers and valves are working. This procedure takes approximately one hour. There are no restrictions for this procedure.  Follow-Up: Please keep your appointment with [name] as scheduled.     Signed, Tonny Bollman, MD  05/13/2020 8:12 AM    [facility]

## 2020-05-08 NOTE — Patient Instructions (Signed)
Medication Instructions:  Your provider recommends that you continue on your current medications as directed. Please refer to the Current Medication list given to you today.  *If you need a refill on your cardiac medications before your next appointment, please call your pharmacy*  Testing/Procedures: Your provider has requested that you have an echocardiogram in 6 months. Echocardiography is a painless test that uses sound waves to create images of your heart. It provides your doctor with information about the size and shape of your heart and how well your heart's chambers and valves are working. This procedure takes approximately one hour. There are no restrictions for this procedure.  Follow-Up: Please keep your appointment with [name] as scheduled.

## 2020-05-09 ENCOUNTER — Other Ambulatory Visit: Payer: Self-pay | Admitting: Cardiology

## 2020-05-13 ENCOUNTER — Encounter: Payer: Self-pay | Admitting: Cardiovascular Disease

## 2020-05-19 IMAGING — DX DG CHEST 2V
2 series · 2 of 2 positions shown · non-contrast
Comparison: 10/26/2014

CLINICAL DATA: Weakness starting 4 days prior to imaging.

EXAM:
CHEST - 2 VIEW

[w chest lat]
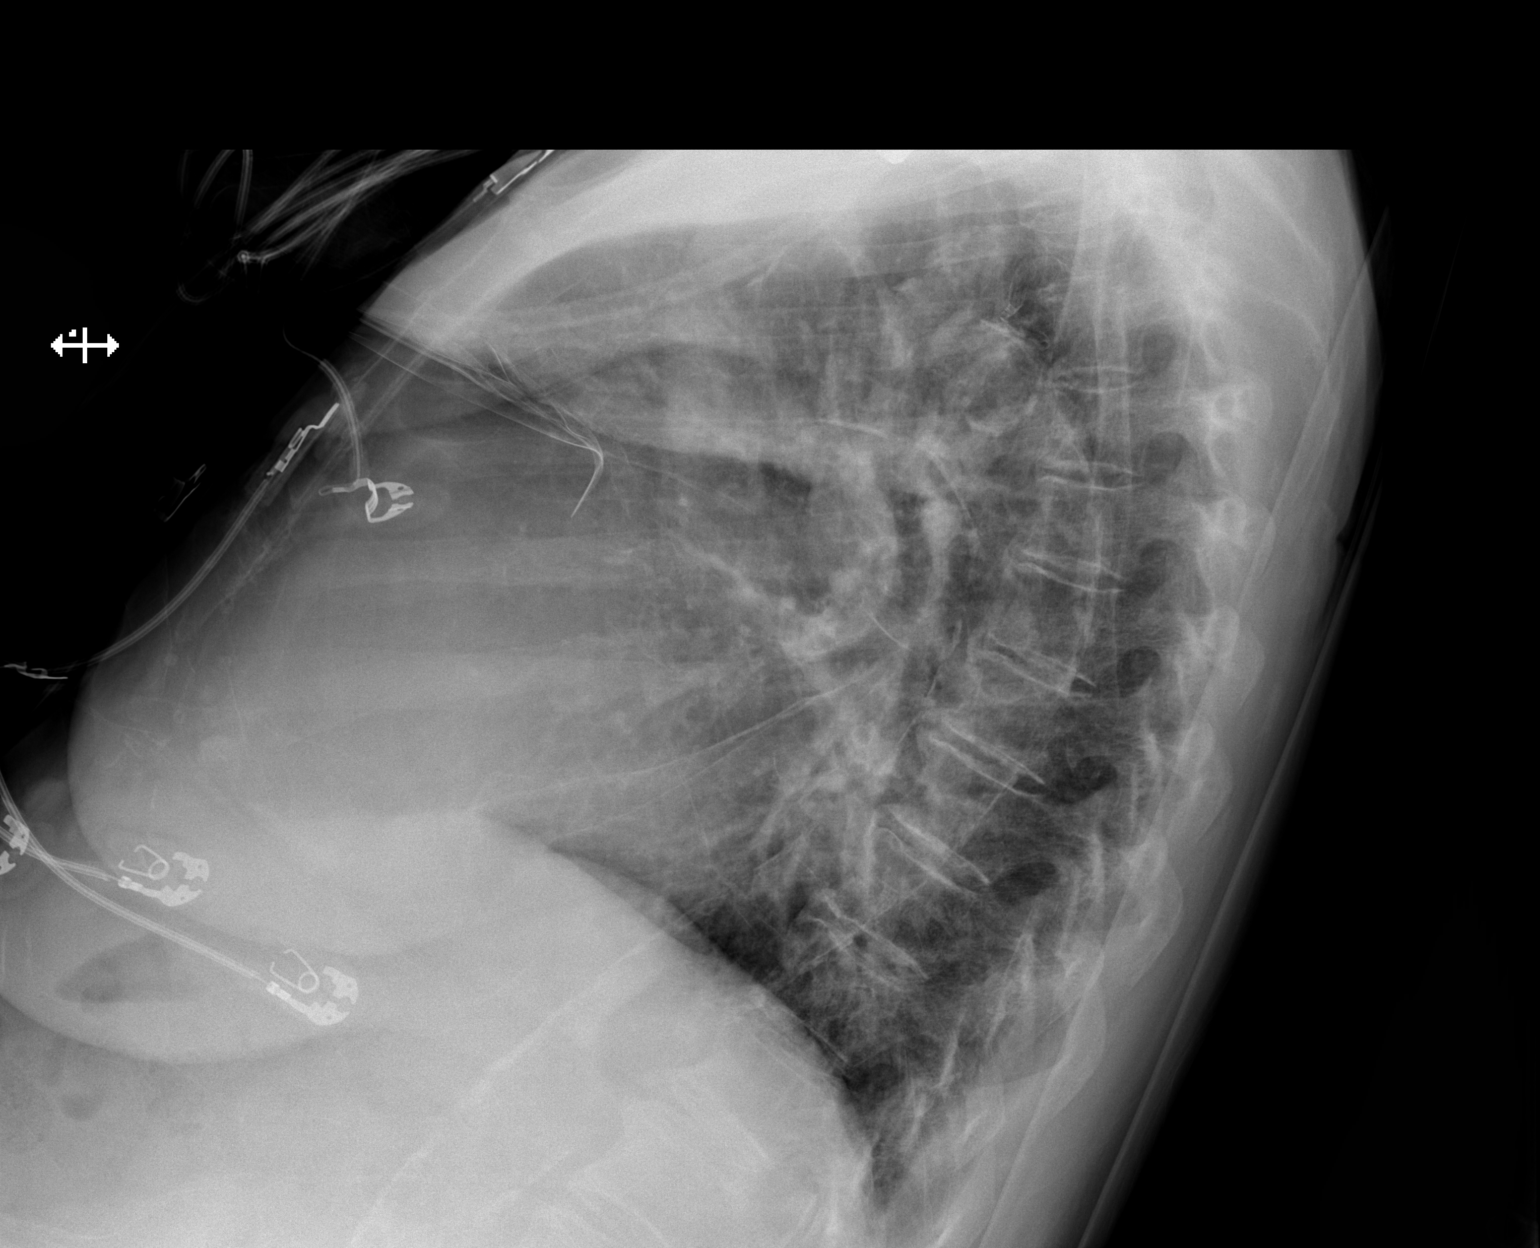

[x chest ap]
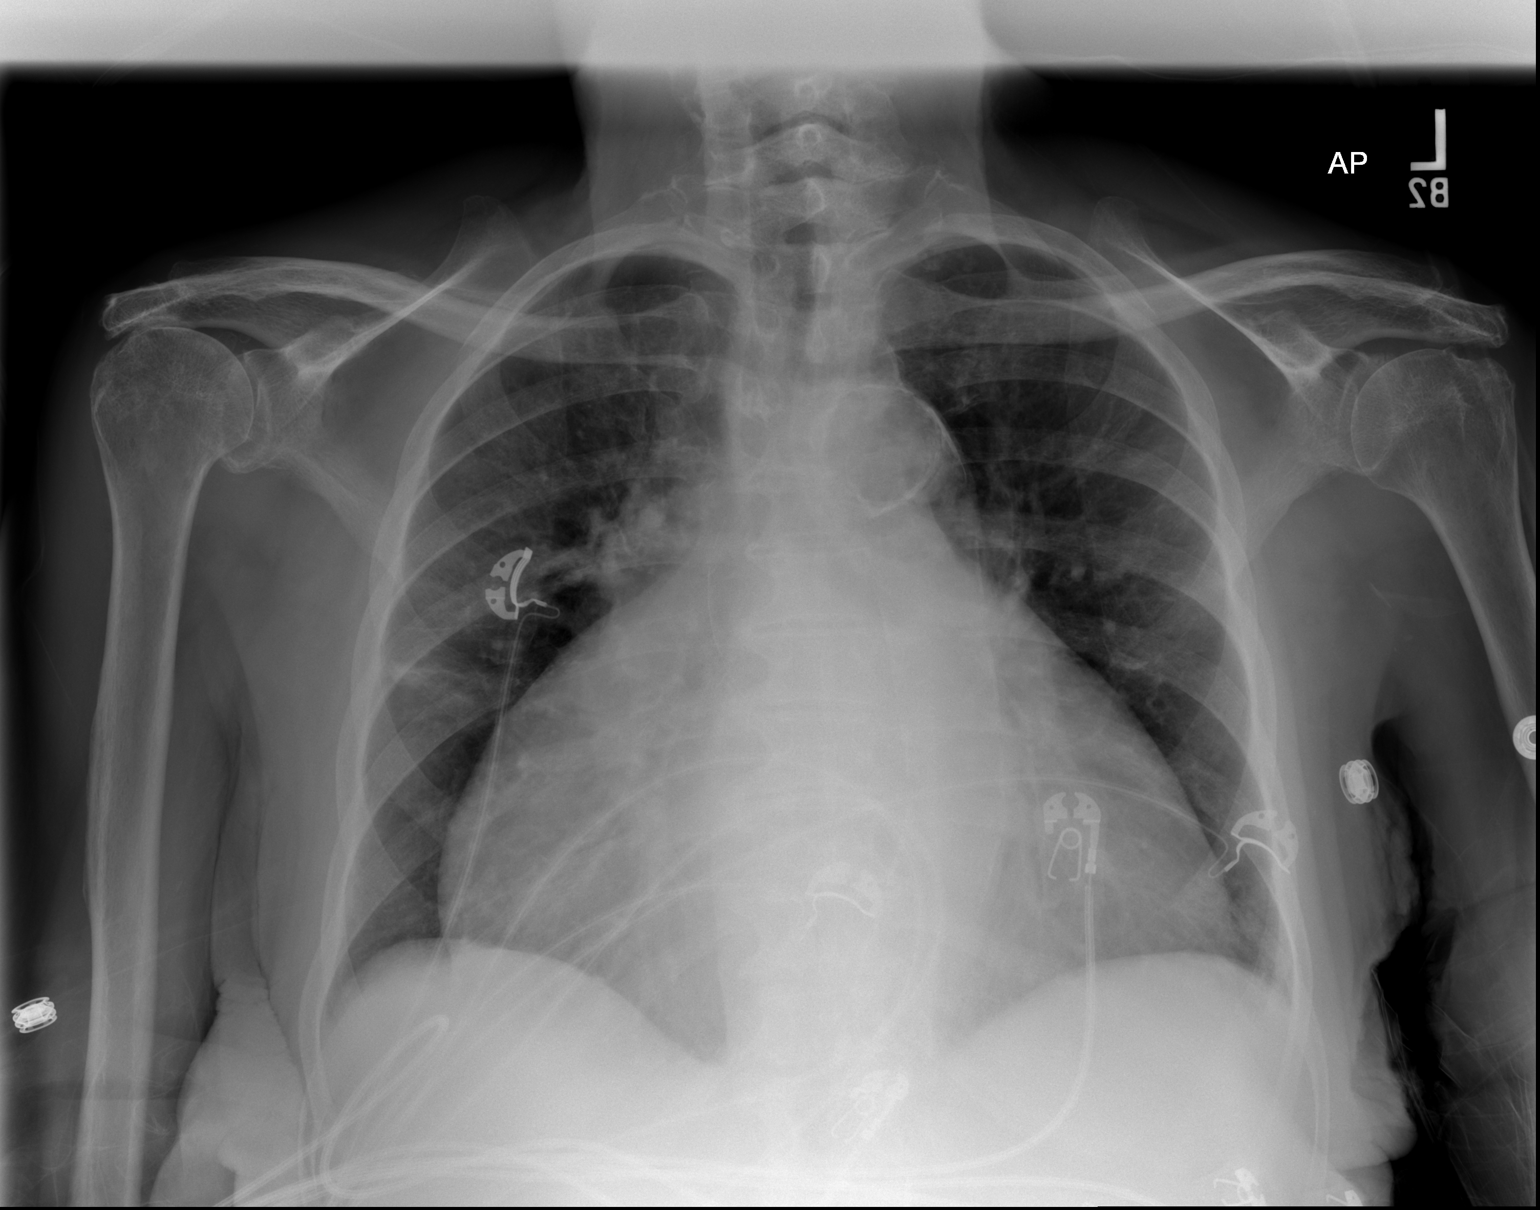

[2 of 2 positions shown; findings below may reference images not displayed]

FINDINGS: Prominent enlargement of the cardiopericardial silhouette, similar
to prior. The right heart is particularly enlarged. Atherosclerotic
calcification of the aortic arch.

Bandlike density in the right mid lung likely from atelectasis or
scarring. The lungs appear otherwise clear. Mild thoracic
spondylosis. No blunting of the costophrenic angles. Reduced right
acromial humeral space implies potential rotator cuff tear.
IMPRESSION: 1. Prominent but chronic enlargement of the cardiopericardial
silhouette, without edema.
2. Right mid lung atelectasis or scarring.
3.  Aortic Atherosclerosis ([id number]).
4. Thoracic spondylosis.

## 2020-05-20 IMAGING — CT CT ANGIO CHEST
1 of 2 series · 5 of 8 positions shown · IV contrast (iopamidol)
Comparison: Neck CTA on 10/06/2018; no prior chest CT

CLINICAL DATA: Worsening dyspnea. Abnormal vascular structure in
superior mediastinum on recent neck CTA.

EXAM:
CT ANGIOGRAPHY CHEST WITH CONTRAST
TECHNIQUE: Multidetector CT imaging of the chest was performed using the
standard protocol during bolus administration of intravenous
contrast. Multiplanar CT image reconstructions and MIPs were
obtained to evaluate the vascular anatomy.
CONTRAST:  75mL XKH2ET-8DS IOPAMIDOL (XKH2ET-8DS) INJECTION 76%

[Series 5: monitoring 10.0 b30f · axial · 0.59mm/px · z∈[+1339,+1339]mm · 5 of 7 slices shown]
[im 2/7  lung]
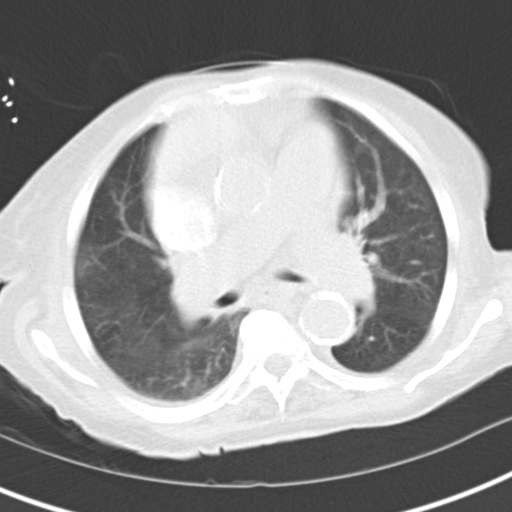
[im 3/7  soft-tissue]
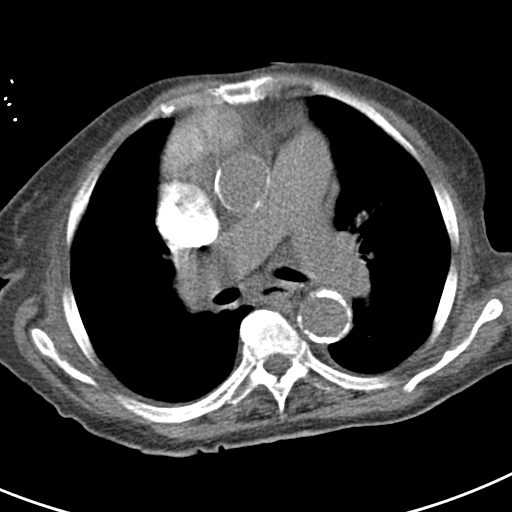
[im 4/7  lung]
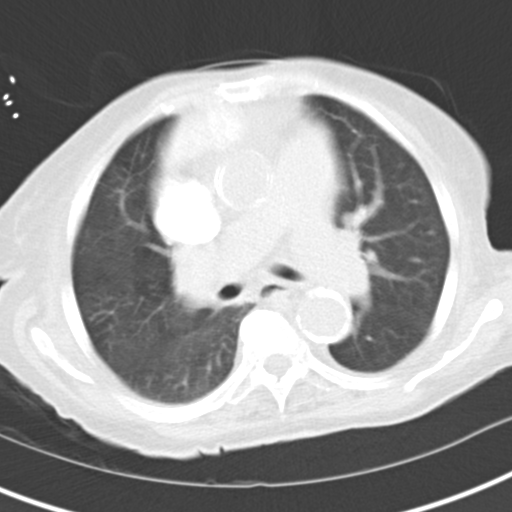
[im 5/7  soft-tissue]
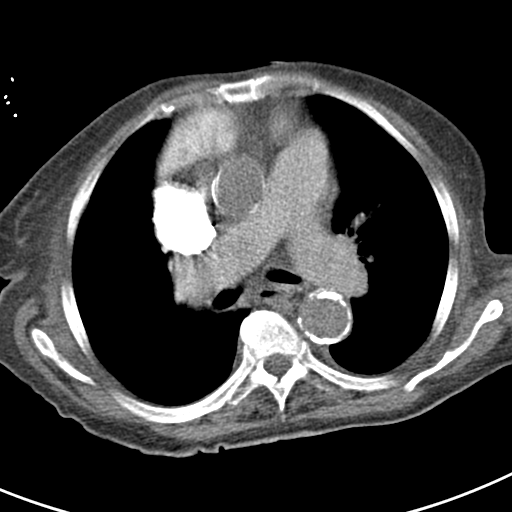
[im 6/7  lung]
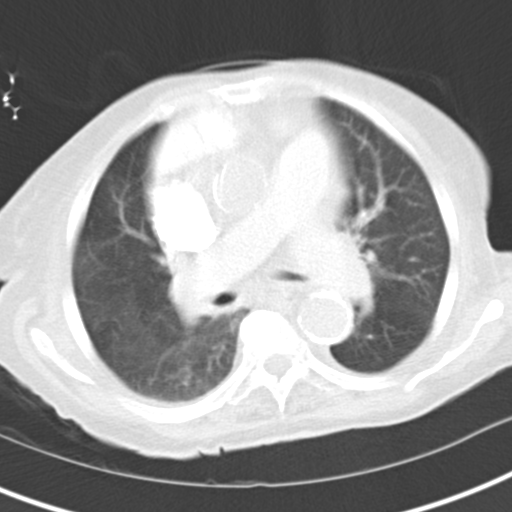

[5 of 8 positions shown; findings below may reference images not displayed]

FINDINGS: Cardiovascular: Marked cardiomegaly is seen with severe right atrial
enlargement. Dilated right atrial appendage corresponds to the
mediastinal abnormality seen on recent neck CTA. Aberrant origin of
right subclavian artery incidentally noted.

Satisfactory opacification of pulmonary arteries noted, and no
pulmonary emboli identified. No evidence of thoracic aorticaneurysm.
Aortic and coronary arterial atherosclerosis.

Mediastinum/Nodes: No masses or pathologically enlarged lymph nodes
identified.

Lungs/Pleura: No pulmonary mass, infiltrate, or effusion.

Upper abdomen: Distension and contrast opacification of IVC and
hepatic veins is consistent with right heart insufficiency.

Musculoskeletal: No suspicious bone lesions identified.

Review of the MIP images confirms the above findings.
IMPRESSION: No evidence of pulmonary embolism or other acute findings.

Marked cardiomegaly with severe right atrial enlargement, consistent
with tricuspid insufficiency. Distention of IVC and hepatic veins is
also consistent with right heart insufficiency.

Incidentally noted aberrant origin of right subclavian artery.

Aortic and coronary arterial atherosclerosis.

## 2020-06-11 ENCOUNTER — Other Ambulatory Visit: Payer: Self-pay | Admitting: Cardiology

## 2020-06-12 NOTE — Telephone Encounter (Signed)
No show to INR check on 7/21 and not re-scheduled

## 2020-06-12 NOTE — Telephone Encounter (Signed)
Overdue for INR check, scheduled an appointment for Monday 8/2 to get INR checked. Will send in refill for a 1 month supply because pt only has 2 tablets left of Warfarin.

## 2020-06-15 ENCOUNTER — Ambulatory Visit (INDEPENDENT_AMBULATORY_CARE_PROVIDER_SITE_OTHER): Payer: Medicare Other | Admitting: *Deleted

## 2020-06-15 ENCOUNTER — Other Ambulatory Visit: Payer: Self-pay

## 2020-06-15 DIAGNOSIS — Z5181 Encounter for therapeutic drug level monitoring: Secondary | ICD-10-CM

## 2020-06-15 DIAGNOSIS — I4891 Unspecified atrial fibrillation: Secondary | ICD-10-CM | POA: Diagnosis not present

## 2020-06-15 LAB — POCT INR: INR: 3.6 — AB (ref 2.0–3.0)

## 2020-06-15 NOTE — Patient Instructions (Addendum)
Description    Take 1 tablet today, then start taking 1.5 tablets daily except for 1 tablet on Mondays and Fridays.  Recheck INR in 3 weeks. Call Coumadin Clinic  #[phone] with any changes

## 2020-07-20 ENCOUNTER — Other Ambulatory Visit: Payer: Self-pay | Admitting: Cardiology

## 2020-07-21 ENCOUNTER — Other Ambulatory Visit: Payer: Self-pay

## 2020-07-21 ENCOUNTER — Ambulatory Visit (INDEPENDENT_AMBULATORY_CARE_PROVIDER_SITE_OTHER): Payer: Medicare Other | Admitting: *Deleted

## 2020-07-21 DIAGNOSIS — I4891 Unspecified atrial fibrillation: Secondary | ICD-10-CM

## 2020-07-21 DIAGNOSIS — Z5181 Encounter for therapeutic drug level monitoring: Secondary | ICD-10-CM

## 2020-07-21 LAB — POCT INR: INR: 4.4 — AB (ref 2.0–3.0)

## 2020-07-21 NOTE — Patient Instructions (Signed)
Description   Hold today's dose then start taking the dose you should be taking, which is 1.5 tablets daily except 1 tablet on Mondays and Saturdays. Recheck INR in 3 weeks. Call Coumadin Clinic  #[phone] with any changes

## 2020-08-10 ENCOUNTER — Institutional Professional Consult (permissible substitution): Payer: Medicare Other | Admitting: Cardiovascular Disease

## 2020-08-14 ENCOUNTER — Ambulatory Visit (INDEPENDENT_AMBULATORY_CARE_PROVIDER_SITE_OTHER): Payer: Medicare Other | Admitting: Pharmacist

## 2020-08-14 ENCOUNTER — Other Ambulatory Visit: Payer: Self-pay

## 2020-08-14 DIAGNOSIS — I4891 Unspecified atrial fibrillation: Secondary | ICD-10-CM

## 2020-08-14 DIAGNOSIS — Z5181 Encounter for therapeutic drug level monitoring: Secondary | ICD-10-CM

## 2020-08-14 LAB — POCT INR: INR: 3.2 — AB (ref 2.0–3.0)

## 2020-08-14 NOTE — Patient Instructions (Signed)
Description   Continue taking 1.5 tablets daily except 1 tablet on Mondays and Saturdays. Recheck INR in 3 weeks. Call Coumadin Clinic  #[phone] with any changes

## 2020-08-16 NOTE — Progress Notes (Signed)
CARDIOLOGY OFFICE NOTE  Date:  08/26/2020    Joanna [name] Date of Birth: 1941-08-22 Medical Record #[id number]  PCP:  Leighton Ruff, MD  Cardiologist:  Servando Snare   Chief Complaint  Patient presents with  . Follow-up    History of Present Illness: Joanna [name] is a 79 y.o. female who presents today for a follow up visit. Former patient of Dr. Sherryl Barters.   She has a history of permanent AF - has severe MR - on Coumadin.  She's had a prior history of a middle cerebral artery CVA in 2012.  Other issues include HTN, prior LV dysfunction.   Admitted back in [facility] 2016with recurrent stroke. Discharge summary reviewed - apparently did not wish to switch to [place]. Remained on coumadin. Echo was updated again.Noted to beresistant to allowing family support.  Last seen by me in April - her MR has been progressive - she was referred to the structural heart team - saw Dr. Burt Knack in June - she has declined intervention. She was to have repeat echo in 6 months (November).  Comes in today. Here with her daughter who augments the history. She is in a wheelchair.  She says she is "fine". Not short of breath but is having more swelling - weight is up a bit. Only on her Lasix every other day. Getting too much salt - she likes food delivered/take out. No chest pain. No bleeding. Needs her Warfarin refilled.   Past Medical History:  Diagnosis Date  . Acute renal failure ([place]) 02/23/2007-03/22/2007   Now with normalization back to baseline  . Aphasia 2012  . CHF (congestive heart failure) ([place])   . Chronic atrial fibrillation ([place])   . Chronic kidney disease   . Diverticulosis   . Dysnomia 12/2010  . Hypertension   . Ischemic bowel disease ([place])    With GI bleeding  . Left ventricular dysfunction    EF is now 50-55% as of 2012  . Obesity   . Splenic infarct   . Stroke [facility]) February 2012   Now back on coumadin    Past Surgical History:  Procedure Laterality  Date  . COLECTOMY  12/2010   partial  . ILEOSTOMY    . OMENTECTOMY     partial with ileostomy     Medications: Current Meds  Medication Sig  . furosemide (LASIX) 40 MG tablet Take 40 mg by mouth every other day.  . metoprolol succinate (TOPROL-XL) 50 MG 24 hr tablet Take 1 tablet by mouth once daily  . warfarin (COUMADIN) 5 MG tablet TAKE 1 TO 1 & 1/2 (ONE & ONE-HALF) TABLETS BY MOUTH ONCE DAILY **USE  AS  DIRECTED  BY  COUMADIN  CLINIC**     Allergies: No Known Allergies  Social History: The patient  reports that she has never smoked. She has never used smokeless tobacco. She reports that she does not drink alcohol and does not use drugs.   Family History: [name] family history includes Heart disease in her mother; Hypertension in her sister.   Review of Systems: Please see the history of present illness.   All other systems are reviewed and negative.   Physical Exam: VS:  BP 124/80   Pulse 77   Ht '5\' 3"'  (1.6 m)   Wt 160 lb 9.6 oz (72.8 kg)   SpO2 98%   BMI 28.45 kg/m  .  BMI Body mass index is 28.45 kg/m.  Wt Readings from Last 3 Encounters:  08/26/20 160 lb 9.6 oz (72.8 kg)  05/08/20 155 lb 6.4 oz (70.5 kg)  02/18/20 159 lb (72.1 kg)    General: Elderly. Alert and in no acute distress. She is in a wheelchair.   Cardiac: Irregular irregular rhythm. Her rate is ok. III/VI holosystolic murmur. Seems to have visible lift to me. She has 1+ bilateral ankle edema.  Respiratory:  Lungs are clear to auscultation bilaterally with normal work of breathing.  GI: Soft and nontender.  MS: No deformity or atrophy. Gait not tested.  Skin: Warm and dry. Color is normal.  Neuro:  Strength and sensation are intact and no gross focal deficits noted.  Psych: Alert, appropriate and with normal affect.   LABORATORY DATA:  EKG:  EKG is not ordered today.     Lab Results  Component Value Date   WBC 3.7 02/18/2020   HGB 12.4 02/18/2020   HCT 38.1 02/18/2020   PLT 153  02/18/2020   GLUCOSE 92 02/18/2020   CHOL 87 10/07/2018   TRIG 60 10/07/2018   HDL 22 (L) 10/07/2018   LDLCALC 53 10/07/2018   ALT 7 02/18/2020   AST 23 02/18/2020   NA 143 02/18/2020   K 4.5 02/18/2020   CL 108 (H) 02/18/2020   CREATININE 1.01 (H) 02/18/2020   BUN 13 02/18/2020   CO2 23 02/18/2020   TSH 2.930 01/28/2019   INR 3.2 (A) 08/14/2020   HGBA1C 5.5 10/07/2018     BNP (last 3 results) No results for input(s): BNP in the last 8760 hours.  ProBNP (last 3 results) No results for input(s): PROBNP in the last 8760 hours.   Other Studies Reviewed Today:  Echo 03/17/2020: IMPRESSIONS   1. Left ventricular ejection fraction, by estimation, is 50 to 55%. The  left ventricle has low normal function. The left ventricle demonstrates  global hypokinesis. There is mild left ventricular hypertrophy. Left  ventricular diastolic parameters are  indeterminate.  2. Right ventricular systolic function is normal. The right ventricular  size is moderately enlarged. There is mildly elevated pulmonary artery  systolic pressure.  3. Left atrial size was severely dilated.  4. Right atrial size was severely dilated.  5. The mitral valve is normal in structure. Severe mitral valve  regurgitation. No evidence of mitral stenosis.  6. Tricuspid valve regurgitation is severe.  7. The aortic valve is normal in structure. Aortic valve regurgitation is  not visualized. No aortic stenosis is present.  8. Pulmonic valve regurgitation is moderate.  9. The inferior vena cava is dilated in size with <50% respiratory  variability, suggesting right atrial pressure of 15 mmHg.     ASSESSMENT & PLAN:    1. Severe MR - with TR as well - currently in surveillance mode and has not wanted to proceed with further testing - she does have some functional limitations related to age/prior stroke, etc. She says her breathing is stable. She does have more swelling on exam. Would advise to take the  Lasix every day. BMET with next INR. Echo to be updated next month and will review with Dr. Burt Knack per his prior recommendation.    2. Persistent AF - rate is ok - she remains anticoagulated with Coumadin.   3. Chronic anticoagulation with Coumadin - no bleeding noted. Compliance with checking remains an issue. She has declined DOAC therapy  4. Prior stroke.  5. Poor functional status  6. Prior LV dysfunction - EF has improved - she has never been on ideal therapy -  overall compliance has been chronic issue. Recommended daily Lasix now.   7. Prior elevated alk phos - I do not think this was worked up.   Current medicines are reviewed with the patient today.  The patient does not have concerns regarding medicines other than what has been noted above.  The following changes have been made:  See above.  Labs/ tests ordered today include:   No orders of the defined types were placed in this encounter.    Disposition:   FU with echo in November - will review with Dr. Burt Knack. Will transition her to Dr. Marlou Porch in 6 months. They are aware I am leaving in February.      Patient is agreeable to this plan and will call if any problems develop in the interim.   SignedTruitt Merle, NP  08/26/2020 4:09 PM  [facility] [address] [place], [place]  [address] Phone: [phone] Fax: [phone]

## 2020-08-26 ENCOUNTER — Ambulatory Visit (INDEPENDENT_AMBULATORY_CARE_PROVIDER_SITE_OTHER): Payer: Medicare Other | Admitting: Nurse Practitioner

## 2020-08-26 ENCOUNTER — Other Ambulatory Visit: Payer: Self-pay

## 2020-08-26 ENCOUNTER — Encounter: Payer: Self-pay | Admitting: Nurse Practitioner

## 2020-08-26 ENCOUNTER — Other Ambulatory Visit: Payer: Self-pay | Admitting: *Deleted

## 2020-08-26 VITALS — BP 124/80 | HR 77 | Ht 63.0 in | Wt 160.6 lb

## 2020-08-26 DIAGNOSIS — I340 Nonrheumatic mitral (valve) insufficiency: Secondary | ICD-10-CM

## 2020-08-26 DIAGNOSIS — Z79899 Other long term (current) drug therapy: Secondary | ICD-10-CM

## 2020-08-26 DIAGNOSIS — I38 Endocarditis, valve unspecified: Secondary | ICD-10-CM | POA: Diagnosis not present

## 2020-08-26 DIAGNOSIS — I4821 Permanent atrial fibrillation: Secondary | ICD-10-CM

## 2020-08-26 MED ORDER — WARFARIN SODIUM 5 MG PO TABS: ORAL_TABLET | ORAL | 0 refills | Status: DC

## 2020-08-26 NOTE — Patient Instructions (Addendum)
After Visit Summary:  We will be checking the following labs today - NONE  BMET with next INR check    Medication Instructions:    Continue with your current medicines. BUT  Take your Lasix every day - this will help the swelling.  We will ask the Coumadin staff to refill this medicine   If you need a refill on your cardiac medications before your next appointment, please call your pharmacy.     Testing/Procedures To Be Arranged:  Echocardiogram in November - will review with [name]  Follow-Up:   See [name] in 6 months (as new patient)    At [facility], you and your health needs are our priority.  As part of our continuing mission to provide you with exceptional heart care, we have created designated Provider Care Teams.  These Care Teams include your primary Cardiologist (physician) and Advanced Practice Providers (APPs -  Physician Assistants and Nurse Practitioners) who all work together to provide you with the care you need, when you need it.  Special Instructions:  . Stay safe, wash your hands for at least 20 seconds and wear a mask when needed.  . It was good to talk with you both today.    Call the [facility] office at [phone] if you have any questions, problems or concerns.

## 2020-09-04 ENCOUNTER — Other Ambulatory Visit: Payer: Medicare Other

## 2020-09-07 ENCOUNTER — Other Ambulatory Visit: Payer: Self-pay

## 2020-09-07 MED ORDER — FUROSEMIDE 40 MG PO TABS: 40.0000 mg | ORAL_TABLET | Freq: Every day | ORAL | 3 refills | Status: AC

## 2020-09-08 ENCOUNTER — Telehealth: Payer: Self-pay | Admitting: *Deleted

## 2020-09-08 NOTE — Telephone Encounter (Signed)
Pt missed 09/04/2020 appt; called pt and had to leave a message for the pt to call back to get rescheduled. Will await for pt/dtr to return the call.

## 2020-09-10 ENCOUNTER — Telehealth: Payer: Self-pay | Admitting: *Deleted

## 2020-09-10 NOTE — Telephone Encounter (Signed)
Pt is overdue to have INR checked. Called pt and LMOM for pt call coumadin clinic back to schedule an appointment to have INR checked.

## 2020-09-28 ENCOUNTER — Other Ambulatory Visit (HOSPITAL_COMMUNITY): Payer: Medicare Other

## 2020-09-28 ENCOUNTER — Telehealth: Payer: Self-pay | Admitting: *Deleted

## 2020-09-28 NOTE — Telephone Encounter (Signed)
Pt missed her Anticoagulation Appt again today; called pt and left a message for her to call back.  Called the pt's daughter, Donia Guiles, and she made an appt for the pt. She states she will reschedule the echo appt while they are here Wednesday.

## 2020-09-29 ENCOUNTER — Encounter (HOSPITAL_COMMUNITY): Payer: Self-pay | Admitting: Nurse Practitioner

## 2020-09-29 ENCOUNTER — Other Ambulatory Visit: Payer: Self-pay | Admitting: Nurse Practitioner

## 2020-09-30 ENCOUNTER — Ambulatory Visit (INDEPENDENT_AMBULATORY_CARE_PROVIDER_SITE_OTHER): Payer: Medicare Other

## 2020-09-30 DIAGNOSIS — Z5181 Encounter for therapeutic drug level monitoring: Secondary | ICD-10-CM | POA: Diagnosis not present

## 2020-09-30 DIAGNOSIS — I4891 Unspecified atrial fibrillation: Secondary | ICD-10-CM | POA: Diagnosis not present

## 2020-09-30 LAB — POCT INR: INR: 4.6 — AB (ref 2.0–3.0)

## 2020-09-30 NOTE — Telephone Encounter (Signed)
Pt pending appt today (no showed to other appts). Will address refill at the time of appt.

## 2020-09-30 NOTE — Patient Instructions (Signed)
Description   Skip today's dosage of Warfarin, then start taking 1.5 tablets daily except 1 tablet on Mondays, Wednesdays and Saturdays. Recheck INR in 2 weeks. Call Coumadin Clinic  #[phone] with any changes

## 2020-10-14 ENCOUNTER — Other Ambulatory Visit: Payer: Self-pay

## 2020-10-14 ENCOUNTER — Ambulatory Visit (INDEPENDENT_AMBULATORY_CARE_PROVIDER_SITE_OTHER): Payer: Medicare Other | Admitting: *Deleted

## 2020-10-14 DIAGNOSIS — I4891 Unspecified atrial fibrillation: Secondary | ICD-10-CM | POA: Diagnosis not present

## 2020-10-14 DIAGNOSIS — Z5181 Encounter for therapeutic drug level monitoring: Secondary | ICD-10-CM

## 2020-10-14 LAB — POCT INR: INR: 4.5 — AB (ref 2.0–3.0)

## 2020-10-14 NOTE — Patient Instructions (Signed)
Description   Skip today's dosage of Warfarin, then start taking 1 tablet daily except for 1. 5 tablets on Sunday, Tuesday and Thursday. Recheck INR in 2 weeks. Call Coumadin Clinic  #[phone] with any changes

## 2020-10-22 ENCOUNTER — Ambulatory Visit (HOSPITAL_COMMUNITY): Payer: Medicare Other | Attending: Cardiovascular Disease

## 2020-10-22 ENCOUNTER — Other Ambulatory Visit: Payer: Self-pay

## 2020-10-22 DIAGNOSIS — Z79899 Other long term (current) drug therapy: Secondary | ICD-10-CM

## 2020-10-22 DIAGNOSIS — I340 Nonrheumatic mitral (valve) insufficiency: Secondary | ICD-10-CM

## 2020-10-22 LAB — ECHOCARDIOGRAM COMPLETE: S' Lateral: 3.4 cm

## 2020-11-10 ENCOUNTER — Telehealth: Payer: Self-pay | Admitting: *Deleted

## 2020-11-10 NOTE — Telephone Encounter (Signed)
Called pt since she is overdue for her Anticoagulation Appt. Left a message for the pt to call back to get scheduled.

## 2020-12-08 ENCOUNTER — Telehealth: Payer: Self-pay | Admitting: *Deleted

## 2020-12-08 NOTE — Telephone Encounter (Signed)
Called pt since she is overdue for her Anticoagulation Appt; there was no answer so left her a message to call back to set an appt.

## 2020-12-09 ENCOUNTER — Telehealth: Payer: Self-pay | Admitting: *Deleted

## 2020-12-09 NOTE — Telephone Encounter (Signed)
Called pt since she is overdue for INR check; called the dtr [name] and she states her mom is getting older and unsteady, then after she placed me on hold multiple times she set an appt for tomorrow at 3pm. She states pt has enough warfarin at this time.

## 2020-12-15 ENCOUNTER — Telehealth: Payer: Self-pay | Admitting: *Deleted

## 2020-12-15 MED ORDER — WARFARIN SODIUM 5 MG PO TABS
ORAL_TABLET | ORAL | 0 refills | Status: DC
Start: 2020-12-15 — End: 2020-12-16

## 2020-12-15 NOTE — Telephone Encounter (Signed)
Pt has missed and rescheduled multiple Anticoagulation Clinic appointments; called pt today since she missed today's appt and had to leave a message for her to call back to reschedule again.   Called pt's dtr Donia Guiles and advised that the pt is overdue and she is well aware of this. She states that the pt has been unwilling to come to appts but she will ensure she is present tomorrow. So we set an appt for tomorrow. Also, the pt is out of her Warfarin as of today, advised that I can send in a dose for tonight so she does not miss any. 2 pills sent and dtr is aware that cannot send more since pt is overdue and noncompliant. Also, made her aware that with pt going unmonitored for months that it is not safe to continue because she is at risk of bleeding, stroke, and blood clots. She asked about home monitoring and advised that her PCP would have to manage if that were to be approved. She verbalized understanding and stated she would rather her come in to be checked. Advised that pt is usually on a 4-6 week schedule, therefore, we need to continue monitoring until she is able to reach out to her PCP to assist and she verbalized understanding.

## 2020-12-16 ENCOUNTER — Ambulatory Visit (INDEPENDENT_AMBULATORY_CARE_PROVIDER_SITE_OTHER): Payer: Medicare Other | Admitting: *Deleted

## 2020-12-16 ENCOUNTER — Other Ambulatory Visit: Payer: Self-pay

## 2020-12-16 DIAGNOSIS — Z5181 Encounter for therapeutic drug level monitoring: Secondary | ICD-10-CM | POA: Diagnosis not present

## 2020-12-16 DIAGNOSIS — I4891 Unspecified atrial fibrillation: Secondary | ICD-10-CM | POA: Diagnosis not present

## 2020-12-16 LAB — POCT INR: INR: 3.7 — AB (ref 2.0–3.0)

## 2020-12-16 MED ORDER — WARFARIN SODIUM 5 MG PO TABS
ORAL_TABLET | ORAL | 0 refills | Status: DC
Start: 2020-12-16 — End: 2021-01-21

## 2020-12-16 NOTE — Patient Instructions (Signed)
Description   Skip today's dosage of Warfarin, then start taking the dose you should be taking 1 tablet daily except for 1.5 tablets on Sunday, Tuesday and Thursday. Recheck INR in 3 weeks. Call Coumadin Clinic  #[phone] with any changes

## 2021-01-13 ENCOUNTER — Other Ambulatory Visit: Payer: Self-pay

## 2021-01-13 ENCOUNTER — Telehealth: Payer: Self-pay | Admitting: *Deleted

## 2021-01-13 NOTE — Telephone Encounter (Signed)
Called pt since she is overdue for an appt; left a message for her to call back to reschedule her missed appt (was due on 01/06/2021).

## 2021-01-21 ENCOUNTER — Ambulatory Visit (INDEPENDENT_AMBULATORY_CARE_PROVIDER_SITE_OTHER): Payer: Medicare Other

## 2021-01-21 ENCOUNTER — Other Ambulatory Visit: Payer: Self-pay

## 2021-01-21 DIAGNOSIS — Z5181 Encounter for therapeutic drug level monitoring: Secondary | ICD-10-CM | POA: Diagnosis not present

## 2021-01-21 DIAGNOSIS — I4891 Unspecified atrial fibrillation: Secondary | ICD-10-CM | POA: Diagnosis not present

## 2021-01-21 LAB — POCT INR: INR: 2.1 (ref 2.0–3.0)

## 2021-01-21 MED ORDER — WARFARIN SODIUM 5 MG PO TABS
ORAL_TABLET | ORAL | 0 refills | Status: DC
Start: 2021-01-21 — End: 2021-03-04

## 2021-01-21 NOTE — Patient Instructions (Addendum)
  Description   Take 2 tablets today, then resume same dosage 1 tablet daily except for 1.5 tablets on Sundays, Tuesdays and Thursdays. Recheck INR in 3 weeks. Call Coumadin Clinic  #[phone] with any changes  Remind pt to schedule follow up appt with [facility] in April 2022.

## 2021-02-10 ENCOUNTER — Other Ambulatory Visit: Payer: Self-pay

## 2021-02-10 MED ORDER — METOPROLOL SUCCINATE ER 50 MG PO TB24
50.0000 mg | ORAL_TABLET | Freq: Every day | ORAL | 0 refills | Status: DC
Start: 2021-02-10 — End: 2021-05-20

## 2021-02-16 ENCOUNTER — Other Ambulatory Visit: Payer: Self-pay

## 2021-02-16 ENCOUNTER — Ambulatory Visit (INDEPENDENT_AMBULATORY_CARE_PROVIDER_SITE_OTHER): Payer: Medicare Other | Admitting: *Deleted

## 2021-02-16 DIAGNOSIS — Z5181 Encounter for therapeutic drug level monitoring: Secondary | ICD-10-CM

## 2021-02-16 DIAGNOSIS — I4891 Unspecified atrial fibrillation: Secondary | ICD-10-CM

## 2021-02-16 LAB — POCT INR: INR: 1.8 — AB (ref 2.0–3.0)

## 2021-02-16 NOTE — Patient Instructions (Signed)
Description   Take 2 tablets today, then start taking 1.5 tablets daily except 1 tablet Mondays, Wednesdays and Fridays. Recheck INR in 2 weeks. Call Coumadin Clinic  #[phone] with any changes  Remind pt to schedule follow up appt with [facility] in April 2022.

## 2021-03-02 ENCOUNTER — Telehealth: Payer: Self-pay | Admitting: *Deleted

## 2021-03-02 NOTE — Telephone Encounter (Signed)
Called pt since she missed her Anticoagulation Clinic Appt; left a message for her to call back and reschedule appt.

## 2021-03-04 ENCOUNTER — Ambulatory Visit (INDEPENDENT_AMBULATORY_CARE_PROVIDER_SITE_OTHER): Payer: Medicare Other | Admitting: *Deleted

## 2021-03-04 ENCOUNTER — Other Ambulatory Visit: Payer: Self-pay

## 2021-03-04 DIAGNOSIS — I4891 Unspecified atrial fibrillation: Secondary | ICD-10-CM

## 2021-03-04 DIAGNOSIS — Z5181 Encounter for therapeutic drug level monitoring: Secondary | ICD-10-CM | POA: Diagnosis not present

## 2021-03-04 LAB — POCT INR: INR: 2.7 (ref 2.0–3.0)

## 2021-03-04 MED ORDER — WARFARIN SODIUM 5 MG PO TABS: ORAL_TABLET | ORAL | 0 refills | Status: DC

## 2021-03-04 NOTE — Patient Instructions (Signed)
Description   Continue taking 1.5 tablets daily except 1 tablet Mondays, Wednesdays and Fridays. Recheck INR in 2 weeks with [name] appt. Call Coumadin Clinic  #[phone] with any changes

## 2021-03-17 ENCOUNTER — Ambulatory Visit (INDEPENDENT_AMBULATORY_CARE_PROVIDER_SITE_OTHER): Payer: Medicare Other | Admitting: *Deleted

## 2021-03-17 ENCOUNTER — Other Ambulatory Visit: Payer: Self-pay

## 2021-03-17 ENCOUNTER — Encounter: Payer: Self-pay | Admitting: Cardiology

## 2021-03-17 ENCOUNTER — Ambulatory Visit (INDEPENDENT_AMBULATORY_CARE_PROVIDER_SITE_OTHER): Payer: Medicare Other | Admitting: Cardiology

## 2021-03-17 VITALS — BP 130/70 | HR 74 | Ht 63.0 in | Wt 139.0 lb

## 2021-03-17 DIAGNOSIS — I4821 Permanent atrial fibrillation: Secondary | ICD-10-CM | POA: Diagnosis not present

## 2021-03-17 DIAGNOSIS — I4891 Unspecified atrial fibrillation: Secondary | ICD-10-CM

## 2021-03-17 DIAGNOSIS — I340 Nonrheumatic mitral (valve) insufficiency: Secondary | ICD-10-CM

## 2021-03-17 DIAGNOSIS — Z5181 Encounter for therapeutic drug level monitoring: Secondary | ICD-10-CM | POA: Diagnosis not present

## 2021-03-17 LAB — POCT INR: INR: 2.4 (ref 2.0–3.0)

## 2021-03-17 MED ORDER — DILTIAZEM HCL ER COATED BEADS 120 MG PO CP24: 120.0000 mg | ORAL_CAPSULE | Freq: Every day | ORAL | 2 refills | Status: AC

## 2021-03-17 MED ORDER — DILTIAZEM HCL ER COATED BEADS 120 MG PO CP24: 120.0000 mg | ORAL_CAPSULE | Freq: Every day | ORAL | 2 refills | Status: DC

## 2021-03-17 NOTE — Patient Instructions (Signed)

## 2021-03-17 NOTE — Patient Instructions (Signed)
Description   Today take 1.5 tablets then continue taking 1.5 tablets daily except 1 tablet Mondays, Wednesdays and Fridays. Recheck INR in 3 weeks. Call Coumadin Clinic  #[phone] with any changes

## 2021-03-17 NOTE — Progress Notes (Signed)
Cardiology Office Note:    Date:  03/17/2021   ID:  Joanna [name], DOB [birth year], MRN [id number]  PCP:  Margot Ables, MD   [facility] Providers Cardiologist:  Donato Schultz, MD     Referring MD: Margot Ables*    History of Present Illness:    Joanna [name] is a 80 y.o. female here for a follow up of CHF, hypertension, severe mitral regurgitation and atrial fibrillation. She is accompanied by her nurse. She is checking her blood levels every two weeks due to being on 5 mg Coumadin PO daily. She denies having any new changes or symptoms. She is tolerating her medications well and she is taking them consistently. She denies any exertional shortness of breath, chest pain, tightness, or pressure. She has no blood in stool, sporadic bleeding, lightheadedness.  Prior creatinine 1.0 potassium 4.5 TSH 2.9 hemoglobin 12.4 LDL 53  Past Medical History:  Diagnosis Date  . Acute renal failure (HCC) 02/23/2007-03/22/2007   Now with normalization back to baseline  . Aphasia 2012  . CHF (congestive heart failure) (HCC)   . Chronic atrial fibrillation (HCC)   . Chronic kidney disease   . Diverticulosis   . Dysnomia 12/2010  . Hypertension   . Ischemic bowel disease (HCC)    With GI bleeding  . Left ventricular dysfunction    EF is now 50-55% as of 2012  . Obesity   . Splenic infarct   . Stroke [facility]) February 2012   Now back on coumadin    Past Surgical History:  Procedure Laterality Date  . COLECTOMY  12/2010   partial  . ILEOSTOMY    . OMENTECTOMY     partial with ileostomy    Current Medications: Current Meds  Medication Sig  . furosemide (LASIX) 40 MG tablet Take 1 tablet (40 mg total) by mouth daily.  . metoprolol succinate (TOPROL-XL) 50 MG 24 hr tablet Take 1 tablet (50 mg total) by mouth daily. Take with or immediately following a meal. Please keep upcoming appt in May 2022 with [name] before anymore refills. Thank you  . warfarin (COUMADIN)  5 MG tablet Take 1 to 1.5 tablets once daily or as directed by Coumadin Clinic  . [DISCONTINUED] diltiazem (CARDIZEM CD) 120 MG 24 hr capsule Take 1 capsule (120 mg total) by mouth daily.     Allergies:   Patient has no known allergies.   Social History   Socioeconomic History  . Marital status: Widowed    Spouse name: Not on file  . Number of children: 3  . Years of education: Not on file  . Highest education level: Not on file  Occupational History    Employer: RETIRED  Tobacco Use  . Smoking status: Never Smoker  . Smokeless tobacco: Never Used  Vaping Use  . Vaping Use: Never used  Substance and Sexual Activity  . Alcohol use: No  . Drug use: No  . Sexual activity: Not on file  Other Topics Concern  . Not on file  Social History Narrative  . Not on file   Social Determinants of Health   Financial Resource Strain: Not on file  Food Insecurity: Not on file  Transportation Needs: Not on file  Physical Activity: Not on file  Stress: Not on file  Social Connections: Not on file     Family History: [name] family history includes Heart disease in her mother; Hypertension in her sister.  ROS:   Please see the history of  present illness. All other systems reviewed and are negative.  EKGs/Labs/Other Studies Reviewed:    The following studies were reviewed today: ECHO(10/22/2020) 1. Left ventricular ejection fraction, by estimation, is 55%. The left ventricle has normal function. The left ventricle has no regional wall motion abnormalities. Left ventricular diastolic parameters were normal. 2. Significant RV failure with volume overload . Right ventricular systolic function is moderately reduced. The right ventricular size is moderately enlarged. 3. Left atrial size was severely dilated. 4. Right atrial size was Massively Dilated. 5. The mitral valve is degenerative. Moderate to severe mitral valve regurgitation. No evidence of mitral stenosis. Moderate  mitral annular calcification. 6. Severe TR with non copatation of septal and lateral leaflets . The tricuspid valve is abnormal. Tricuspid valve regurgitation is severe. 7. The aortic valve is tricuspid. Aortic valve regurgitation is not visualized. Mild to moderate aortic valve sclerosis/calcification is present, without any evidence of aortic stenosis. 8. Pulmonic valve regurgitation is moderate. 9. The inferior vena cava is dilated in size with <50% respiratory variability, suggesting right atrial pressure of 15 mmHg.  EKG:   03/17/21- EKG is  ordered today.  It shows atrial fibrillation heart rate 74 bpm with nonspecific ST-T wave changes.  Recent Labs: No results found for requested labs within last 8760 hours.  Recent Lipid Panel    Component Value Date/Time   CHOL 87 10/07/2018 0546   TRIG 60 10/07/2018 0546   HDL 22 (L) 10/07/2018 0546   CHOLHDL 4.0 10/07/2018 0546   VLDL 12 10/07/2018 0546   LDLCALC 53 10/07/2018 0546     Risk Assessment/Calculations:      Physical Exam:    VS:  BP 130/70 (BP Location: Left Arm, Patient Position: Sitting, Cuff Size: Normal)   Pulse 74   Ht 5\' 3"  (1.6 m)   Wt 139 lb (63 kg)   SpO2 95%   BMI 24.62 kg/m     Wt Readings from Last 3 Encounters:  03/17/21 139 lb (63 kg)  08/26/20 160 lb 9.6 oz (72.8 kg)  05/08/20 155 lb 6.4 oz (70.5 kg)     GEN: Well nourished, well developed in no acute distress HEENT: Normal NECK: No JVD; No carotid bruits LYMPHATICS: No lymphadenopathy CARDIAC: Irreg, 3/6 holosystolic murmur, no rubs or gallops RESPIRATORY:  Clear to auscultation without rales, wheezing or rhonchi  ABDOMEN: Soft, non-tender, non-distended MUSCULOSKELETAL:  No edema; No deformity  SKIN: Warm and dry NEUROLOGIC:  Alert and oriented x 3 PSYCHIATRIC:  Normal affect   ASSESSMENT:    1. Permanent atrial fibrillation (HCC)   2. Severe mitral regurgitation    PLAN:    In order of problems listed above:  Severe mitral  regurgitation and tricuspid regurgitation - Does not wish to have any further testing according to 05/10/20 Gerhardt's last note.  Did not wish to proceed with any MitraClip for instance.  Overall she has been feeling fairly well.  NYHA class I.  No significant shortness of breath.  She will sometimes skip her Lasix but overall does well.  Once again reiterated with her, she does not wish to have any further procedures done.  Persistent atrial fibrillation - Normal heart rate, on anticoagulation with Coumadin.  Did not wish to switch to DOAC.  She is watching her Coumadin every 2 weeks.  -She does not wish to have generic substitutes for her Cardizem CD. Prior stroke - Continue with Coumadin  Prior LV dysfunction - Thankfully LV has improved.  Daily Lasix recommended.  Follow up in 6 months  Medication Adjustments/Labs and Tests Ordered: Current medicines are reviewed at length with the patient today.  Concerns regarding medicines are outlined above.  Orders Placed This Encounter  Procedures  . EKG 12-Lead   Meds ordered this encounter  Medications  . diltiazem (CARDIZEM CD) 120 MG 24 hr capsule    Sig: Take 1 capsule (120 mg total) by mouth daily.    Dispense:  90 capsule    Refill:  2    Patient requests brand name Cardiazem only    Patient Instructions  Medication Instructions:  The current medical regimen is effective;  continue present plan and medications.  *If you need a refill on your cardiac medications before your next appointment, please call your pharmacy*  Follow-Up: At [facility], you and your health needs are our priority.  As part of our continuing mission to provide you with exceptional heart care, we have created designated Provider Care Teams.  These Care Teams include your primary Cardiologist (physician) and Advanced Practice Providers (APPs -  Physician Assistants and Nurse Practitioners) who all work together to provide you with the care you need, when you  need it.  We recommend signing up for the patient portal called "MyChart".  Sign up information is provided on this After Visit Summary.  MyChart is used to connect with patients for Virtual Visits (Telemedicine).  Patients are able to view lab/test results, encounter notes, upcoming appointments, etc.  Non-urgent messages can be sent to your provider as well.   To learn more about what you can do with MyChart, go to [email or web address].    Your next appointment:   6 month(s)  The format for your next appointment:   In Person  Provider:   Donato Schultz, MD   Thank you for choosing [facility]!!          I,Alexis Bryant,acting as a scribe for Donato Schultz, MD.,have documented all relevant documentation on the behalf of Donato Schultz, MD,as directed by  Donato Schultz, MD while in the presence of Donato Schultz, MD.  I, Donato Schultz, MD, have reviewed all documentation for this visit. The documentation on 03/17/21 for the exam, diagnosis, procedures, and orders are all accurate and complete.  Signed, Donato Schultz, MD  03/17/2021 2:59 PM    [facility]

## 2021-04-07 ENCOUNTER — Other Ambulatory Visit: Payer: Self-pay | Admitting: *Deleted

## 2021-04-07 MED ORDER — WARFARIN SODIUM 5 MG PO TABS: ORAL_TABLET | ORAL | 0 refills | Status: DC

## 2021-04-07 NOTE — Telephone Encounter (Signed)
[name] called and stated pt isn't feeling well and needed one tablet sent until tomorrow; since she had to change appt from today

## 2021-04-08 ENCOUNTER — Other Ambulatory Visit: Payer: Self-pay

## 2021-04-08 ENCOUNTER — Ambulatory Visit (INDEPENDENT_AMBULATORY_CARE_PROVIDER_SITE_OTHER): Payer: Medicare Other

## 2021-04-08 DIAGNOSIS — I4891 Unspecified atrial fibrillation: Secondary | ICD-10-CM | POA: Diagnosis not present

## 2021-04-08 DIAGNOSIS — Z5181 Encounter for therapeutic drug level monitoring: Secondary | ICD-10-CM | POA: Diagnosis not present

## 2021-04-08 LAB — POCT INR: INR: 2.9 (ref 2.0–3.0)

## 2021-04-08 MED ORDER — WARFARIN SODIUM 5 MG PO TABS: ORAL_TABLET | ORAL | 0 refills | Status: DC

## 2021-04-08 NOTE — Patient Instructions (Signed)
Description   Continue taking 1.5 tablets daily except 1 tablet Mondays, Wednesdays and Fridays. Recheck INR in 4 weeks. Call Coumadin Clinic  #[phone] with any changes

## 2021-05-05 ENCOUNTER — Other Ambulatory Visit: Payer: Self-pay | Admitting: *Deleted

## 2021-05-05 MED ORDER — WARFARIN SODIUM 5 MG PO TABS: ORAL_TABLET | ORAL | 0 refills | Status: DC

## 2021-05-14 ENCOUNTER — Ambulatory Visit (INDEPENDENT_AMBULATORY_CARE_PROVIDER_SITE_OTHER): Payer: Medicare Other | Admitting: *Deleted

## 2021-05-14 ENCOUNTER — Other Ambulatory Visit: Payer: Self-pay

## 2021-05-14 DIAGNOSIS — Z5181 Encounter for therapeutic drug level monitoring: Secondary | ICD-10-CM

## 2021-05-14 DIAGNOSIS — I4891 Unspecified atrial fibrillation: Secondary | ICD-10-CM

## 2021-05-14 LAB — POCT INR: INR: 3.8 — AB (ref 2.0–3.0)

## 2021-05-14 MED ORDER — WARFARIN SODIUM 5 MG PO TABS: ORAL_TABLET | ORAL | 0 refills | Status: DC

## 2021-05-14 NOTE — Patient Instructions (Signed)
Description   Hold warfarin today and then continue taking 1.5 tablets daily except 1 tablet Mondays, Wednesdays and Fridays. Recheck INR in 3 weeks. Call Coumadin Clinic  #[phone] with any changes

## 2021-05-19 ENCOUNTER — Other Ambulatory Visit: Payer: Self-pay | Admitting: *Deleted

## 2021-05-19 NOTE — Telephone Encounter (Signed)
Pt needs refill of Toprol-XL sent to the [facility] pharmacy.

## 2021-05-20 ENCOUNTER — Other Ambulatory Visit: Payer: Self-pay | Admitting: *Deleted

## 2021-05-20 MED ORDER — METOPROLOL SUCCINATE ER 50 MG PO TB24: 50.0000 mg | ORAL_TABLET | Freq: Every day | ORAL | 3 refills | Status: AC

## 2021-05-20 NOTE — Telephone Encounter (Signed)
Outpatient Medication Detail   Disp Refills Start End   metoprolol succinate (TOPROL-XL) 50 MG 24 hr tablet 90 tablet 3 05/20/2021    Sig - Route: Take 1 tablet (50 mg total) by mouth daily. Take with or immediately following a meal. - Oral   Sent to pharmacy as: metoprolol succinate (TOPROL-XL) 50 MG 24 hr tablet   E-Prescribing Status: Receipt confirmed by pharmacy (05/20/2021  6:59 AM EDT)     Pharmacy  [id number] - [place], [place] - [address]

## 2021-06-04 ENCOUNTER — Telehealth: Payer: Self-pay | Admitting: *Deleted

## 2021-06-04 NOTE — Telephone Encounter (Signed)
Called pt since she has an appointment in the Anticoagulation Clinic today at 330pm (pt has shown up later than appt time in the past or no showed); there was no answer/no voicemail on one line and left a message on the other line-dtr's line to remind her of the appt and to be on time for the 3330pm appt.

## 2021-06-11 ENCOUNTER — Telehealth: Payer: Self-pay | Admitting: *Deleted

## 2021-06-11 NOTE — Telephone Encounter (Signed)
Pt overdue to have INR check called pt- no answer.

## 2021-06-21 ENCOUNTER — Other Ambulatory Visit: Payer: Self-pay | Admitting: *Deleted

## 2021-06-21 MED ORDER — WARFARIN SODIUM 5 MG PO TABS: ORAL_TABLET | ORAL | 0 refills | Status: DC

## 2021-06-21 NOTE — Telephone Encounter (Signed)
Dtr called and stated they have been having car trouble and now it is fixed. She states they are able to come to Anticoagulation Appt on Friday. Advised that we will need to ensure pt keeps appts and if she is late she will have to reschedule. She states pt needs a refill until appt and a small supply was sent. Note placed on appt that if late will need to reschedule over the phone due to an interaction at another visit with other staff.

## 2021-06-25 ENCOUNTER — Other Ambulatory Visit: Payer: Self-pay

## 2021-06-25 ENCOUNTER — Ambulatory Visit (INDEPENDENT_AMBULATORY_CARE_PROVIDER_SITE_OTHER): Payer: Medicare Other | Admitting: *Deleted

## 2021-06-25 ENCOUNTER — Other Ambulatory Visit: Payer: Self-pay | Admitting: Cardiology

## 2021-06-25 DIAGNOSIS — I4891 Unspecified atrial fibrillation: Secondary | ICD-10-CM | POA: Diagnosis not present

## 2021-06-25 DIAGNOSIS — Z5181 Encounter for therapeutic drug level monitoring: Secondary | ICD-10-CM

## 2021-06-25 LAB — POCT INR: INR: 3.6 — AB (ref 2.0–3.0)

## 2021-06-25 NOTE — Patient Instructions (Signed)
Description   Take 1/2 a tablet of warfarin today and then continue to take 1 tablet daily except for 1.5 tablets on Monday, Wednesday and Fridays. Recheck INR in 3 weeks.

## 2021-06-28 NOTE — Telephone Encounter (Addendum)
Prescription refill request received for warfarin Lov:  skains, 03/17/2021 Next INR check: 9/2 Warfarin tablet strength: 5mg 

## 2021-06-28 NOTE — Telephone Encounter (Signed)
Pt has been managed at [facility] I will route to the [facility] anticoag pool for review

## 2021-06-29 ENCOUNTER — Other Ambulatory Visit: Payer: Self-pay | Admitting: Cardiology

## 2021-06-30 NOTE — Telephone Encounter (Signed)
Will route to [facility] as they have been managing her coumadin Prescription refill request received for warfarin Lov:  skains, 03/17/2021 Next INR check: 9/2 Warfarin tablet strength: 5mg  Warfarin schedule: continue to take 1 tablet daily except for 1.5 tablets on Monday, Wednesday and Fridays.

## 2021-07-16 ENCOUNTER — Emergency Department (HOSPITAL_COMMUNITY): Payer: Medicare Other

## 2021-07-16 ENCOUNTER — Ambulatory Visit
Admission: EM | Admit: 2021-07-16 | Discharge: 2021-07-16 | Disposition: A | Discharge status: Home / Self Care | Payer: Medicare Other

## 2021-07-16 ENCOUNTER — Other Ambulatory Visit: Payer: Self-pay

## 2021-07-16 ENCOUNTER — Encounter (HOSPITAL_COMMUNITY): Payer: Self-pay

## 2021-07-16 ENCOUNTER — Inpatient Hospital Stay (HOSPITAL_COMMUNITY)
Admission: EM | Admit: 2021-07-16 | Discharge: 2021-08-14 | DRG: 291 | Disposition: E | Payer: Medicare Other | Attending: Student in an Organized Health Care Education/Training Program | Admitting: Student in an Organized Health Care Education/Training Program

## 2021-07-16 DIAGNOSIS — I4891 Unspecified atrial fibrillation: Secondary | ICD-10-CM

## 2021-07-16 DIAGNOSIS — Z515 Encounter for palliative care: Secondary | ICD-10-CM

## 2021-07-16 DIAGNOSIS — R0600 Dyspnea, unspecified: Secondary | ICD-10-CM | POA: Diagnosis not present

## 2021-07-16 DIAGNOSIS — E669 Obesity, unspecified: Secondary | ICD-10-CM | POA: Diagnosis present

## 2021-07-16 DIAGNOSIS — I4821 Permanent atrial fibrillation: Secondary | ICD-10-CM | POA: Diagnosis present

## 2021-07-16 DIAGNOSIS — Z8249 Family history of ischemic heart disease and other diseases of the circulatory system: Secondary | ICD-10-CM

## 2021-07-16 DIAGNOSIS — Z20822 Contact with and (suspected) exposure to covid-19: Secondary | ICD-10-CM | POA: Diagnosis present

## 2021-07-16 DIAGNOSIS — I130 Hypertensive heart and chronic kidney disease with heart failure and stage 1 through stage 4 chronic kidney disease, or unspecified chronic kidney disease: Secondary | ICD-10-CM | POA: Diagnosis not present

## 2021-07-16 DIAGNOSIS — I482 Chronic atrial fibrillation, unspecified: Secondary | ICD-10-CM | POA: Diagnosis not present

## 2021-07-16 DIAGNOSIS — R791 Abnormal coagulation profile: Secondary | ICD-10-CM | POA: Diagnosis present

## 2021-07-16 DIAGNOSIS — I509 Heart failure, unspecified: Secondary | ICD-10-CM | POA: Diagnosis not present

## 2021-07-16 DIAGNOSIS — R0789 Other chest pain: Secondary | ICD-10-CM | POA: Diagnosis not present

## 2021-07-16 DIAGNOSIS — Z7901 Long term (current) use of anticoagulants: Secondary | ICD-10-CM

## 2021-07-16 DIAGNOSIS — R509 Fever, unspecified: Secondary | ICD-10-CM | POA: Diagnosis present

## 2021-07-16 DIAGNOSIS — Z79899 Other long term (current) drug therapy: Secondary | ICD-10-CM

## 2021-07-16 DIAGNOSIS — D539 Nutritional anemia, unspecified: Secondary | ICD-10-CM | POA: Diagnosis present

## 2021-07-16 DIAGNOSIS — F0390 Unspecified dementia without behavioral disturbance: Secondary | ICD-10-CM | POA: Diagnosis present

## 2021-07-16 DIAGNOSIS — I50813 Acute on chronic right heart failure: Secondary | ICD-10-CM | POA: Diagnosis present

## 2021-07-16 DIAGNOSIS — Z9049 Acquired absence of other specified parts of digestive tract: Secondary | ICD-10-CM

## 2021-07-16 DIAGNOSIS — I340 Nonrheumatic mitral (valve) insufficiency: Secondary | ICD-10-CM | POA: Diagnosis present

## 2021-07-16 DIAGNOSIS — J9601 Acute respiratory failure with hypoxia: Secondary | ICD-10-CM | POA: Diagnosis not present

## 2021-07-16 DIAGNOSIS — I5082 Biventricular heart failure: Secondary | ICD-10-CM | POA: Diagnosis present

## 2021-07-16 DIAGNOSIS — Z8673 Personal history of transient ischemic attack (TIA), and cerebral infarction without residual deficits: Secondary | ICD-10-CM

## 2021-07-16 DIAGNOSIS — Z7189 Other specified counseling: Secondary | ICD-10-CM

## 2021-07-16 DIAGNOSIS — I5033 Acute on chronic diastolic (congestive) heart failure: Secondary | ICD-10-CM | POA: Diagnosis present

## 2021-07-16 DIAGNOSIS — Z9114 Patient's other noncompliance with medication regimen: Secondary | ICD-10-CM

## 2021-07-16 DIAGNOSIS — I081 Rheumatic disorders of both mitral and tricuspid valves: Secondary | ICD-10-CM | POA: Diagnosis present

## 2021-07-16 DIAGNOSIS — Z66 Do not resuscitate: Secondary | ICD-10-CM | POA: Diagnosis not present

## 2021-07-16 DIAGNOSIS — N189 Chronic kidney disease, unspecified: Secondary | ICD-10-CM | POA: Diagnosis present

## 2021-07-16 DIAGNOSIS — Z6828 Body mass index (BMI) 28.0-28.9, adult: Secondary | ICD-10-CM

## 2021-07-16 DIAGNOSIS — D696 Thrombocytopenia, unspecified: Secondary | ICD-10-CM | POA: Diagnosis present

## 2021-07-16 DIAGNOSIS — I959 Hypotension, unspecified: Secondary | ICD-10-CM | POA: Diagnosis not present

## 2021-07-16 HISTORY — DX: Unspecified dementia, unspecified severity, without behavioral disturbance, psychotic disturbance, mood disturbance, and anxiety: F03.90

## 2021-07-16 LAB — CBC
HCT: 35.7 % — ABNORMAL LOW (ref 36.0–46.0)
Hemoglobin: 11.2 g/dL — ABNORMAL LOW (ref 12.0–15.0)
MCH: 32.4 pg (ref 26.0–34.0)
MCHC: 31.4 g/dL (ref 30.0–36.0)
MCV: 103.2 fL — ABNORMAL HIGH (ref 80.0–100.0)
Platelets: 148 10*3/uL — ABNORMAL LOW (ref 150–400)
RBC: 3.46 MIL/uL — ABNORMAL LOW (ref 3.87–5.11)
RDW: 14.6 % (ref 11.5–15.5)
WBC: 6.4 10*3/uL (ref 4.0–10.5)
nRBC: 0 % (ref 0.0–0.2)

## 2021-07-16 LAB — BASIC METABOLIC PANEL
Anion gap: 9 (ref 5–15)
BUN: 12 mg/dL (ref 8–23)
CO2: 25 mmol/L (ref 22–32)
Calcium: 8.7 mg/dL — ABNORMAL LOW (ref 8.9–10.3)
Chloride: 106 mmol/L (ref 98–111)
Creatinine, Ser: 0.98 mg/dL (ref 0.44–1.00)
GFR, Estimated: 58 mL/min — ABNORMAL LOW (ref >60)
Glucose, Bld: 95 mg/dL (ref 70–99)
Potassium: 3.6 mmol/L (ref 3.5–5.1)
Sodium: 140 mmol/L (ref 135–145)

## 2021-07-16 LAB — BRAIN NATRIURETIC PEPTIDE: B Natriuretic Peptide: 1307.3 pg/mL — ABNORMAL HIGH (ref 0.0–100.0)

## 2021-07-16 LAB — PROTIME-INR
INR: 3.9 — ABNORMAL HIGH (ref 0.8–1.2)
Prothrombin Time: 38.3 seconds — ABNORMAL HIGH (ref 11.4–15.2)

## 2021-07-16 LAB — TROPONIN I (HIGH SENSITIVITY)
Troponin I (High Sensitivity): 17 ng/L (ref <18)
Troponin I (High Sensitivity): 19 ng/L — ABNORMAL HIGH (ref <18)

## 2021-07-16 MED ORDER — SODIUM CHLORIDE 0.9% FLUSH: 3.0000 mL | Freq: Once | INTRAVENOUS | Status: DC

## 2021-07-16 MED ORDER — POTASSIUM CHLORIDE CRYS ER 20 MEQ PO TBCR
40.0000 meq | EXTENDED_RELEASE_TABLET | Freq: Once | ORAL | Status: AC
  Administered 2021-07-16: 40 meq via ORAL
  Filled 2021-07-16: qty 2

## 2021-07-16 MED ORDER — ACETAMINOPHEN 650 MG RE SUPP: 650.0000 mg | Freq: Four times a day (QID) | RECTAL | Status: DC | PRN

## 2021-07-16 MED ORDER — FUROSEMIDE 10 MG/ML IJ SOLN: 40.0000 mg | Freq: Two times a day (BID) | INTRAMUSCULAR | Status: DC

## 2021-07-16 MED ORDER — FUROSEMIDE 10 MG/ML IJ SOLN
40.0000 mg | Freq: Once | INTRAMUSCULAR | Status: AC
  Administered 2021-07-16: 40 mg via INTRAVENOUS
  Filled 2021-07-16: qty 4

## 2021-07-16 MED ORDER — ACETAMINOPHEN 325 MG PO TABS
650.0000 mg | ORAL_TABLET | Freq: Four times a day (QID) | ORAL | Status: DC | PRN
  Administered 2021-07-17 – 2021-07-23 (×4): 650 mg via ORAL
  Filled 2021-07-16 (×4): qty 2

## 2021-07-16 MED ORDER — DILTIAZEM HCL ER COATED BEADS 120 MG PO CP24
120.0000 mg | ORAL_CAPSULE | Freq: Every evening | ORAL | Status: DC
  Administered 2021-07-16 – 2021-07-17 (×2): 120 mg via ORAL
  Filled 2021-07-16 (×2): qty 1

## 2021-07-16 MED ORDER — METOPROLOL SUCCINATE ER 50 MG PO TB24
50.0000 mg | ORAL_TABLET | Freq: Every evening | ORAL | Status: DC
  Administered 2021-07-16 – 2021-07-20 (×5): 50 mg via ORAL
  Filled 2021-07-16: qty 2
  Filled 2021-07-16 (×4): qty 1

## 2021-07-16 NOTE — ED Triage Notes (Signed)
Patient is being discharged from the Urgent Care and sent to the Emergency Department via EMS. Per Phillip Heal, patient is in need of higher level of care due to patient medical history and symptoms. Patient is aware and verbalizes understanding of plan of care.   Vitals:   2021-07-27 1452  BP: (!) 141/80  Pulse: 96  Resp: 20  Temp: 98.9 F (37.2 C)  SpO2: 98%

## 2021-07-16 NOTE — H&P (Addendum)
Date: 07/17/2021               Patient Name:  Joanna [name] MRN: [id number]  DOB: 1940-11-25 Age / Sex: 80 y.o., female   PCP: Joanna Ables, MD         Medical Service: Internal Medicine Teaching Service         Attending Physician: Dr. Oswaldo [name], Joanna [name], *    First Contact: Dr. Montel [name] Pager: [phone]  Second Contact: Dr. Salena [name] Pager: [phone]       After Hours (After 5p/  First Contact Pager: [phone]  weekends / holidays): Second Contact Pager: [phone]   Chief Complaint: Shortness of breath  History of Present Illness:  Patient is a 80 y.o. F with a PMH of HFpEF, severe mitral and tricuspid regurgitation, atrial fibrillation on coumadin, HTN, and prior CVAs complaining of increasing shortness of breath for the past couple of weeks, accompanied by chest pain that is centrally located for the past 4 days described as tightness. Does not radiate, not associated with any N/V. Tightness occurred with deep breaths, but has improved upon time of interview. Also endorsing increased swelling in her legs, did not response to home lasix dose x2 which she took several days ago. She takes lasix as needed infrequently, a few times a month.  No changes occurred in the last two weeks to her medications but states that she did not take her lasix in the past two days due to her legs being heavy. Woke up short of breath this morning with an associated cough producing some phlegm. Her daughter took her to urgent care and they sent the patient to the ED. Daughter notes lower extremity swelling is the worst it has ever been. She has had difficulty ambulating due to the severity of swelling and has had difficulty performing ADL's. Usually sleeps on two pillows, has not changed over the past few days. Dry weight of 164 lbs. Has not taken any of her home medications today.   Also noticed increasing fatigue. Denies difficulty lying flat. Endorses frequent snoring in the evening.  Last admission for CHF exacerbation was 6 years ago. Urination decreased three days ago. Eating/drinking has decreased past few days. Hx of headaches, taking tylenol regularly. Constipated recently. Memory has worsened over past week. Endorses episode of chills. Denies fever.  Prior CVA deficit of memory difficulties. Denies weakness. Last CVA was two years ago.    Meds:  Current Meds  Medication Sig   acetaminophen (TYLENOL) 500 MG tablet Take 1,000 mg by mouth every 6 (six) hours as needed for headache or mild pain.   diltiazem (CARDIZEM CD) 120 MG 24 hr capsule Take 1 capsule (120 mg total) by mouth daily. (Patient taking differently: Take 120 mg by mouth every evening.)   furosemide (LASIX) 40 MG tablet Take 1 tablet (40 mg total) by mouth daily. (Patient taking differently: Take 40 mg by mouth daily as needed for fluid.)   metoprolol succinate (TOPROL-XL) 50 MG 24 hr tablet Take 1 tablet (50 mg total) by mouth daily. Take with or immediately following a meal. (Patient taking differently: Take 50 mg by mouth every evening. Take with or immediately following a meal.)   warfarin (COUMADIN) 5 MG tablet TAKE 1 TO 1 & 1/2 (ONE & ONE-HALF) TABLETS BY MOUTH ONCE DAILY AS DIRECTED BY COUMADIN CLINIC (Patient taking differently: No sig reported)    Allergies: Allergies as of 08/10/21   (No Known Allergies)   Past  Medical History:  Diagnosis Date   Acute renal failure (HCC) 02/23/2007-03/22/2007   Now with normalization back to baseline   Aphasia 2012   CHF (congestive heart failure) (HCC)    Chronic atrial fibrillation (HCC)    Chronic kidney disease    Dementia (HCC)    Diverticulosis    Dysnomia 12/2010   Hypertension    Ischemic bowel disease (HCC)    With GI bleeding   Left ventricular dysfunction    EF is now 50-55% as of 2012   Obesity    Splenic infarct    Stroke (HCC) 12/2010   Now back on coumadin   Family History:  Denies family history of heart disease or CVA.   Social  History:  Lives with daughter who is her caretaker and helps patient with her medical care.  She uses a walker and is able to perform her ADL's. Denies tobacco products, denies alcohol use.   Review of Systems: A complete ROS was negative except as per HPI.   Physical Exam: Blood pressure 110/62, pulse 78, temperature 98.4 F (36.9 C), temperature source Oral, resp. rate 18, height 5\' 4"  (1.626 m), weight 72 kg, SpO2 98 %. Gen: Elderly woman resting comfortably in bed in NAD HEENT: MMM, neck supple, EOM intact CV: irregularly irregular rhythm, tachycardic, holosystolic 2/6 murmur is present, 2+ pitting edema, JVD up to the level of the mandible Resp: Increased work of breathing, on 4L [place], mild bibasilar crackles Abd: soft distended, bowel sounds present, mild tenderness to deep palpation MSK: moves extremities without difficulty Skin: Warm dry, chronic stasis changes of the bilateral LE Neuro:alert, answering questions appropriately Psych: normal affect  EKG: personally reviewed my interpretation is atrial fibrillation, rate of 97, T wave inversions II, III, AVF, similar to prior from May 2022  CXR: personally reviewed my interpretation is cardiomegaly, mild pulmonary edema and small right pleural effusion.  Assessment & Plan by Problem: Active Problems:   Acute exacerbation of CHF (congestive heart failure) (HCC)  Joanna [name] is a 80 y.o. female with a PMH of HFpEF, severe mitral and tricuspid regurgitation, atrial fibrillation on coumadin, HTN, and prior CVAs presenting with chest pain and SOB found to have an acute HF exacerbation.  #Acute on Chronic HF exacerbation with preserved EF #Severe mitral regurgitation and tricuspid regurgitation Patient coming in with a history of two weeks of increasing SOB and leg swelling in the setting of known severe mitral regurg and tricuspid regurg that she does not wish to have any further interventions/procedures on. Last cardiology notes  states NYHA class I. BNP elevated at 1307. Suspect patient skipping lasix is a partial driver factor of current presentation but  will order echo to assess for overall worsening of patient's heart function with known valvular disease. Last Echo in 10/2020 showed E of 55%. Do not have high suspicion for infectious etiology driving current exacerbation, troponins flat at 17 and 19, do not suspect ischemia. Patient on full dose anticoagulation, low suspicion for PE. Patient appears to be responding well to dose of IV lasix, will continue to monitor UOP overnight. - s/p 40  IV lasix in the ED - 40 IV lasix BID - F/u Echo - New oxygen requirement of 4L; wean as able - Skin warm and dry, restarting home medications: Cardizem 120 mg daily, metoprolol succinate 50 mg daily - daily BMP; K>4, Mg >2 - telemetry - daily weights - CTM UOP - strict I/O  #Atrial Fibrillation on coumadin Patient tachycardic to the  low 100s in the setting of missed medication today.  - CHADSVASC 7 - Continue home coumadin dosing per pharmacy - continue home metoprolol and diltiazem as above  #History of multiple prior CVAs No residual physical deficits per daughter except for impaired memory. Per chart review patient was recommended to be on statin therapy but is not currently taking this. Last lipid panel and A1c in 2019.  #Macrocytic Anemia MCV 103, Transfuse <7,  -f/u B12 and folate levels  #Hypertension Continue home cardizem and metoprolol as above  Dispo: Admit patient to Inpatient with expected length of stay greater than 2 midnights.  Signed: Ilene Qua MD Pager: [phone]  After 5pm on weekdays and 1pm on weekends: On Call pager: [phone]

## 2021-07-16 NOTE — ED Notes (Signed)
Received verbal report from Christina C RN at this time 

## 2021-07-16 NOTE — ED Notes (Signed)
Pt moved to room at this time 

## 2021-07-16 NOTE — ED Notes (Addendum)
Pt felt warm to the touch took temperature and was 98.9

## 2021-07-16 NOTE — ED Notes (Signed)
Report called to ED Charge

## 2021-07-16 NOTE — ED Notes (Signed)
Attempted to call report and was advised that charge needed to look at it and to call back

## 2021-07-16 NOTE — ED Notes (Signed)
Called lab to check on lab status. Labs were sent to lab at 1622. They stated, "we have it and it will be ran shortly".

## 2021-07-16 NOTE — ED Provider Notes (Signed)
[facility] EMERGENCY DEPARTMENT Provider Note   CSN: [id number] Arrival date & time: July 31, 2021  1554     History Chief Complaint  Patient presents with   Chest Pain    Joanna [name] is a 80 y.o. female.  HPI 80 year old female presents with chest pain or shortness of breath.  Symptoms started yesterday and have been on and off.  No obvious cause for why her symptoms come on but they last a couple hours.  She went to urgent care where she was found to have O2 sats in the high 80s/low 90s and was sent here on oxygen.  She is not currently having chest pain.  She cannot really describe what it feels like.  No fevers or cough.  She does have leg swelling this been getting despite taking her diuretic.  Past Medical History:  Diagnosis Date   Acute renal failure (HCC) 02/23/2007-03/22/2007   Now with normalization back to baseline   Aphasia 2012   CHF (congestive heart failure) (HCC)    Chronic atrial fibrillation (HCC)    Chronic kidney disease    Dementia (HCC)    Diverticulosis    Dysnomia 12/2010   Hypertension    Ischemic bowel disease (HCC)    With GI bleeding   Left ventricular dysfunction    EF is now 50-55% as of 2012   Obesity    Splenic infarct    Stroke (HCC) 12/2010   Now back on coumadin    Patient Active Problem List   Diagnosis Date Noted   Acute exacerbation of CHF (congestive heart failure) (HCC) Jul 31, 2021   TIA (transient ischemic attack) 10/06/2018   Weakness    Chronic kidney disease    Hypertension    CHF (congestive heart failure) (HCC)    Stroke-like symptoms 11/27/2016   Stroke (cerebrum) (HCC) 10/27/2015   CVA (cerebral infarction) 10/26/2015   Acute ischemic left MCA stroke (HCC) 10/26/2015   Chronic atrial fibrillation 10/26/2015   Encounter for therapeutic drug monitoring 01/03/2014   Left ventricular dysfunction    Atrial fibrillation (HCC) 02/04/2011   Stroke (HCC) 12/15/2010    Past Surgical History:  Procedure  Laterality Date   COLECTOMY  12/2010   partial   ILEOSTOMY     OMENTECTOMY     partial with ileostomy     OB History   No obstetric history on file.     Family History  Problem Relation Age of Onset   Heart disease Mother    Hypertension Sister     Social History   Tobacco Use   Smoking status: Never   Smokeless tobacco: Never  Vaping Use   Vaping Use: Never used  Substance Use Topics   Alcohol use: No   Drug use: No    Home Medications Prior to Admission medications   Medication Sig Start Date End Date Taking? Authorizing Provider  acetaminophen (TYLENOL) 500 MG tablet Take 1,000 mg by mouth every 6 (six) hours as needed for headache or mild pain.   Yes [provider]  diltiazem (CARDIZEM CD) 120 MG 24 hr capsule Take 1 capsule (120 mg total) by mouth daily. Patient taking differently: Take 120 mg by mouth every evening. 03/17/21  Yes Jake Bathe, MD  furosemide (LASIX) 40 MG tablet Take 1 tablet (40 mg total) by mouth daily. Patient taking differently: Take 40 mg by mouth daily as needed for fluid. 09/07/20  Yes Rosalio Macadamia, NP  metoprolol succinate (TOPROL-XL) 50 MG 24  hr tablet Take 1 tablet (50 mg total) by mouth daily. Take with or immediately following a meal. Patient taking differently: Take 50 mg by mouth every evening. Take with or immediately following a meal. 05/20/21  Yes Skains, Veverly Fells, MD  warfarin (COUMADIN) 5 MG tablet TAKE 1 TO 1 & 1/2 (ONE & ONE-HALF) TABLETS BY MOUTH ONCE DAILY AS DIRECTED BY COUMADIN CLINIC Patient taking differently: No sig reported 06/30/21  Yes Jake Bathe, MD    Allergies    Patient has no known allergies.  Review of Systems   Review of Systems  Constitutional:  Negative for fever.  Respiratory:  Positive for shortness of breath. Negative for cough.   Cardiovascular:  Positive for chest pain and leg swelling.  All other systems reviewed and are negative.  Physical Exam Updated Vital Signs BP 138/84    Pulse (!) 108   Temp 98.9 F (37.2 C) (Oral)   Resp (!) 29   Ht 5\' 3"  (1.6 m)   Wt 74.4 kg   SpO2 96%   BMI 29.05 kg/m   Physical Exam Vitals and nursing note reviewed.  Constitutional:      General: She is not in acute distress.    Appearance: She is well-developed. She is not ill-appearing or diaphoretic.  HENT:     Head: Normocephalic and atraumatic.     Right Ear: External ear normal.     Left Ear: External ear normal.     Nose: Nose normal.  Eyes:     General:        Right eye: No discharge.        Left eye: No discharge.  Cardiovascular:     Rate and Rhythm: Tachycardia present. Rhythm irregular.     Heart sounds: Normal heart sounds.     Comments: HR low 100s Pulmonary:     Effort: Tachypnea present. No accessory muscle usage or respiratory distress.     Breath sounds: Examination of the right-lower field reveals decreased breath sounds. Examination of the left-lower field reveals decreased breath sounds. Decreased breath sounds present.  Abdominal:     Palpations: Abdomen is soft.     Tenderness: There is no abdominal tenderness.  Musculoskeletal:     Right lower leg: Edema present.     Left lower leg: Edema present.     Comments: Pitting edema to BLE  Skin:    General: Skin is warm and dry.  Neurological:     Mental Status: She is alert.  Psychiatric:        Mood and Affect: Mood is not anxious.    ED Results / Procedures / Treatments   Labs (all labs ordered are listed, but only abnormal results are displayed) Labs Reviewed  BASIC METABOLIC PANEL - Abnormal; Notable for the following components:      Result Value   Calcium 8.7 (*)    GFR, Estimated 58 (*)    All other components within normal limits  CBC - Abnormal; Notable for the following components:   RBC 3.46 (*)    Hemoglobin 11.2 (*)    HCT 35.7 (*)    MCV 103.2 (*)    Platelets 148 (*)    All other components within normal limits  BRAIN NATRIURETIC PEPTIDE - Abnormal; Notable for the  following components:   B Natriuretic Peptide 1,307.3 (*)    All other components within normal limits  PROTIME-INR - Abnormal; Notable for the following components:   Prothrombin Time 38.3 (*)  INR 3.9 (*)    All other components within normal limits  TROPONIN I (HIGH SENSITIVITY) - Abnormal; Notable for the following components:   Troponin I (High Sensitivity) 19 (*)    All other components within normal limits  SARS CORONAVIRUS 2 (TAT 6-24 HRS)  MAGNESIUM  TSH  CBC WITH DIFFERENTIAL/PLATELET  BASIC METABOLIC PANEL  MAGNESIUM  PROTIME-INR  TROPONIN I (HIGH SENSITIVITY)    EKG EKG Interpretation  Date/Time:  Friday 08/15/2021 15:56:07 EDT Ventricular Rate:  97 PR Interval:    QRS Duration: 106 QT Interval:  334 QTC Calculation: 425 R Axis:   56 Text Interpretation: Atrial fibrillation Borderline repolarization abnormality ST/T changes similar to 2019 Confirmed by Pricilla Loveless [phone]) on 15-Aug-2021 4:11:30 PM  Radiology DG Chest Portable 1 View  Result Date: 2021/08/15 CLINICAL DATA:  Chest pain EXAM: PORTABLE CHEST 1 VIEW COMPARISON:  Radiograph 10/06/2018, chest CT 10/07/2018 FINDINGS: Unchanged markedly enlarged cardiac silhouette. Aortic calcifications. There is mild diffuse interstitial opacities and thickening of the minor fissure. There is a small right pleural effusion. There is no visible pneumothorax. Bilateral shoulder degenerative changes. No acute osseous abnormality. Thoracic spondylosis. IMPRESSION: Unchanged markedly enlarged heart. Mild pulmonary edema and small right pleural effusion. Electronically Signed   By: Caprice Renshaw M.D.   On: Aug 15, 2021 16:54    Procedures Procedures   Medications Ordered in ED Medications  sodium chloride flush (NS) 0.9 % injection 3 mL (3 mLs Intravenous Not Given Aug 15, 2021 1625)  diltiazem (CARDIZEM CD) 24 hr capsule 120 mg (120 mg Oral Given 08/15/21 2012)  metoprolol succinate (TOPROL-XL) 24 hr tablet 50 mg (50 mg Oral  Given 2021-08-15 2012)  furosemide (LASIX) injection 40 mg (has no administration in time range)  acetaminophen (TYLENOL) tablet 650 mg (has no administration in time range)    Or  acetaminophen (TYLENOL) suppository 650 mg (has no administration in time range)  potassium chloride SA (KLOR-CON) CR tablet 40 mEq (has no administration in time range)  furosemide (LASIX) injection 40 mg (40 mg Intravenous Given 15-Aug-2021 1857)    ED Course  I have reviewed the triage vital signs and the nursing notes.  Pertinent labs & imaging results that were available during my care of the patient were reviewed by me and considered in my medical decision making (see chart for details).    MDM Rules/Calculators/A&P                           Patient appears to have acute CHF.  She is not in distress and is not currently hypoxic though apparently she was hypoxic at urgent care.  However she has symptomatic CHF and I think it would be best to admit her for IV diuresis.  Discussed with internal medicine for admission.  She does have a mildly elevated troponin that is likely from the CHF rather than ACS. Final Clinical Impression(s) / ED Diagnoses Final diagnoses:  Acute on chronic congestive heart failure, unspecified heart failure type [facility])    Rx / DC Orders ED Discharge Orders     None        Pricilla Loveless, MD Aug 15, 2021 2100

## 2021-07-16 NOTE — ED Triage Notes (Addendum)
Daughter states that mother has been SOB today, and c/o chest pain. States patient has CHF and has not been taking her Lasix. Patient o2 sat in high 80s (88-90) with no o2. O2 @ 2L applied via Nasal cannula now in high 90s (98-99%). There is swelling to bilateral lower extremities. Patient displays no signs of distress at this time.   Provider assessed patient and EMS activated.

## 2021-07-16 NOTE — Progress Notes (Addendum)
ANTICOAGULATION CONSULT NOTE - Initial Consult  Pharmacy Consult for warfarin (PTA medication) Indication: atrial fibrillation   No Known Allergies  Patient Measurements: Height: 5\' 3"  (160 cm) Weight: 74.4 kg (164 lb) IBW/kg (Calculated) : 52.4  Vital Signs: Temp: 98.9 F (37.2 C) (09/02 1911) Temp Source: Oral (09/02 1911) BP: 144/86 (09/02 2012) Pulse Rate: 110 (09/02 2012)  Labs: Recent Labs    07/15/2021 1619 07/31/2021 1806  HGB 11.2*  --   HCT 35.7*  --   PLT 148*  --   LABPROT  --  38.3*  INR  --  3.9*  CREATININE 0.98  --   TROPONINIHS 17 19*    Estimated Creatinine Clearance: 44.2 mL/min (by C-G formula based on SCr of 0.98 mg/dL).   Medical History: Past Medical History:  Diagnosis Date   Acute renal failure (HCC) 02/23/2007-03/22/2007   Now with normalization back to baseline   Aphasia 2012   CHF (congestive heart failure) (HCC)    Chronic atrial fibrillation (HCC)    Chronic kidney disease    Dementia (HCC)    Diverticulosis    Dysnomia 12/2010   Hypertension    Ischemic bowel disease (HCC)    With GI bleeding   Left ventricular dysfunction    EF is now 50-55% as of 2012   Obesity    Splenic infarct    Stroke (HCC) 12/2010   Now back on coumadin    Medications: see MAR  Assessment: 80 yo F with SOB, CXR with mild pulm edema and small R pleural effusion. PMHx of CAD, CVA (memory deficits), Afib (PTA warfarin), and HTN  PTA warfarin regimen: 7.5mg  daily on Monday's, Wednesday's and Friday's. 5mg  daily on Tuesday's, Thursday's, Saturday's, and Sunday's. Last dose 9/1 per med rec.  Goal of Therapy:  INR 2-3 Monitor platelets by anticoagulation protocol: Yes   Plan: Warfarin held since INR on admit is 3.9, f/u INR in AM and dose accordingly.  Monitor for s/sx of bleeding and daily CBC.  [birth year]1987, PharmD, [facility] Emergency Medicine Clinical Pharmacist ED RPh Phone: [phone] Main RX: [phone]

## 2021-07-16 NOTE — ED Provider Notes (Signed)
[facility]    CSN: [id number] Arrival date & time: Jul 24, 2021  1427      History   Chief Complaint Chief Complaint  Patient presents with   Chest Pain   Shortness of Breath    HPI Joanna [name] is a 80 y.o. female.   Patient presents with concerns shortness of breath and chest pressure as well as swelling in both legs. She started noticing this about a week ago but it has been gradually worsening. She has history of CHF as well as A fib. She tried taking her Lasix two days in a row but didn't notice much improvement. She states she only takes the Lasix as needed for lower leg swelling and usually one day of taking it will clear things up. The patient denies recent sickness such as URI symptoms, fever, or any changes in diet or medications. The patient does not normally use oxygen at all and denies prior similar symptoms.   The history is provided by the patient.  Chest Pain Associated symptoms: cough and shortness of breath   Associated symptoms: no dizziness, no fatigue, no fever, no headache, no nausea and no vomiting   Shortness of Breath Associated symptoms: chest pain and cough   Associated symptoms: no fever, no headaches, no rash and no vomiting    Past Medical History:  Diagnosis Date   Acute renal failure (HCC) 02/23/2007-03/22/2007   Now with normalization back to baseline   Aphasia 2012   CHF (congestive heart failure) (HCC)    Chronic atrial fibrillation (HCC)    Chronic kidney disease    Diverticulosis    Dysnomia 12/2010   Hypertension    Ischemic bowel disease (HCC)    With GI bleeding   Left ventricular dysfunction    EF is now 50-55% as of 2012   Obesity    Splenic infarct    Stroke [facility]) February 2012   Now back on coumadin    Patient Active Problem List   Diagnosis Date Noted   TIA (transient ischemic attack) 10/06/2018   Weakness    Chronic kidney disease    Hypertension    CHF (congestive heart failure) (HCC)    Stroke-like  symptoms 11/27/2016   Stroke (cerebrum) (HCC) 10/27/2015   CVA (cerebral infarction) 10/26/2015   Acute ischemic left MCA stroke (HCC) 10/26/2015   Chronic atrial fibrillation 10/26/2015   Encounter for therapeutic drug monitoring 01/03/2014   Left ventricular dysfunction    Atrial fibrillation (HCC) 02/04/2011   Stroke (HCC) 12/15/2010    Past Surgical History:  Procedure Laterality Date   COLECTOMY  12/2010   partial   ILEOSTOMY     OMENTECTOMY     partial with ileostomy    OB History   No obstetric history on file.      Home Medications    Prior to Admission medications   Medication Sig Start Date End Date Taking? Authorizing Provider  diltiazem (CARDIZEM CD) 120 MG 24 hr capsule Take 1 capsule (120 mg total) by mouth daily. 03/17/21   Jake Bathe, MD  furosemide (LASIX) 40 MG tablet Take 1 tablet (40 mg total) by mouth daily. 09/07/20   Rosalio Macadamia, NP  metoprolol succinate (TOPROL-XL) 50 MG 24 hr tablet Take 1 tablet (50 mg total) by mouth daily. Take with or immediately following a meal. 05/20/21   Jake Bathe, MD  warfarin (COUMADIN) 5 MG tablet TAKE 1 TO 1 & 1/2 (ONE & ONE-HALF) TABLETS BY MOUTH  ONCE DAILY AS DIRECTED BY COUMADIN CLINIC 06/30/21   Jake Bathe, MD    Family History Family History  Problem Relation Age of Onset   Heart disease Mother    Hypertension Sister     Social History Social History   Tobacco Use   Smoking status: Never   Smokeless tobacco: Never  Vaping Use   Vaping Use: Never used  Substance Use Topics   Alcohol use: No   Drug use: No     Allergies   Patient has no known allergies.   Review of Systems Review of Systems  Constitutional:  Negative for fatigue and fever.  Respiratory:  Positive for cough, chest tightness and shortness of breath.   Cardiovascular:  Positive for chest pain and leg swelling.  Gastrointestinal:  Negative for nausea and vomiting.  Skin:  Negative for rash.  Neurological:  Negative  for dizziness, syncope and headaches.  Psychiatric/Behavioral:  Negative for confusion.     Physical Exam Triage Vital Signs ED Triage Vitals [07/19/2021 1452]  Enc Vitals Group     BP (!) 141/80     Pulse Rate 96     Resp 20     Temp 98.9 F (37.2 C)     Temp Source Oral     SpO2 98 % on 2L [place]     Weight      Height      Head Circumference      Peak Flow      Pain Score      Pain Loc      Pain Edu?      Excl. in GC?    No data found.  Updated Vital Signs BP (!) 141/80 (BP Location: Right Arm)   Pulse 96   Temp 98.9 F (37.2 C) (Oral)   Resp 20   SpO2 98%   Visual Acuity Right Eye Distance:   Left Eye Distance:   Bilateral Distance:    Right Eye Near:   Left Eye Near:    Bilateral Near:     Physical Exam Vitals and nursing note reviewed.  HENT:     Head: Normocephalic.  Eyes:     Pupils: Pupils are equal, round, and reactive to light.  Cardiovascular:     Rate and Rhythm: Rhythm irregularly irregular.     Heart sounds: Murmur heard.  Pulmonary:     Effort: Tachypnea present. No accessory muscle usage or retractions.     Breath sounds: Rales present.     Comments: Increased work of breathing, diffuse crackles Musculoskeletal:     Right lower leg: 2+ Pitting Edema (up to mid thigh) present.     Left lower leg: 2+ Pitting Edema (up to mid thigh) present.  Skin:    Findings: No rash.  Neurological:     Mental Status: She is alert.  Psychiatric:        Mood and Affect: Mood normal.     UC Treatments / Results  Labs (all labs ordered are listed, but only abnormal results are displayed) Labs Reviewed - No data to display  EKG   Radiology No results found.  Procedures Procedures (including critical care time)  Medications Ordered in UC Medications - No data to display  Initial Impression / Assessment and Plan / UC Course  I have reviewed the triage vital signs and the nursing notes.  Pertinent labs & imaging results that were available  during my care of the patient were reviewed by me and considered  in my medical decision making (see chart for details).    SOB, chest pressure, pitting b/l edema with hx of CHF, bilateral pulm crackles and A fib. S/s consistent with CHF exacerbation causing possible pulm edema. Stable O2 sat of upper 90s on 2L [place], was in mid/upper 80s without. Pt reported usually has normal O2 sats of upper 90s. Improvement on work of breathing with O2 supplementation. EMS transport for further evaluation and mgmt - did not do EKG due to limited time before EMS arrived.   E/M: 1 acute on chronic illness that poses a threat to life or bodily function, no data, high risk ER transfer   Final Clinical Impressions(s) / UC Diagnoses   Final diagnoses:  Acute on chronic congestive heart failure, unspecified heart failure type (HCC)  Dyspnea, unspecified type  Chest pressure  Atrial fibrillation, unspecified type [facility])   Discharge Instructions   None    ED Prescriptions   None    PDMP not reviewed this encounter.   Estanislado Pandy, [place] 2021/08/05 1515

## 2021-07-17 ENCOUNTER — Inpatient Hospital Stay (HOSPITAL_COMMUNITY): Payer: Medicare Other

## 2021-07-17 DIAGNOSIS — Z8249 Family history of ischemic heart disease and other diseases of the circulatory system: Secondary | ICD-10-CM | POA: Diagnosis not present

## 2021-07-17 DIAGNOSIS — Z8673 Personal history of transient ischemic attack (TIA), and cerebral infarction without residual deficits: Secondary | ICD-10-CM | POA: Diagnosis not present

## 2021-07-17 DIAGNOSIS — Z6828 Body mass index (BMI) 28.0-28.9, adult: Secondary | ICD-10-CM | POA: Diagnosis not present

## 2021-07-17 DIAGNOSIS — R509 Fever, unspecified: Secondary | ICD-10-CM | POA: Diagnosis present

## 2021-07-17 DIAGNOSIS — I361 Nonrheumatic tricuspid (valve) insufficiency: Secondary | ICD-10-CM | POA: Diagnosis not present

## 2021-07-17 DIAGNOSIS — Z66 Do not resuscitate: Secondary | ICD-10-CM | POA: Diagnosis not present

## 2021-07-17 DIAGNOSIS — I509 Heart failure, unspecified: Secondary | ICD-10-CM | POA: Diagnosis present

## 2021-07-17 DIAGNOSIS — I50813 Acute on chronic right heart failure: Secondary | ICD-10-CM | POA: Diagnosis present

## 2021-07-17 DIAGNOSIS — I5082 Biventricular heart failure: Secondary | ICD-10-CM | POA: Diagnosis present

## 2021-07-17 DIAGNOSIS — N189 Chronic kidney disease, unspecified: Secondary | ICD-10-CM | POA: Diagnosis present

## 2021-07-17 DIAGNOSIS — I482 Chronic atrial fibrillation, unspecified: Secondary | ICD-10-CM | POA: Diagnosis not present

## 2021-07-17 DIAGNOSIS — Z9114 Patient's other noncompliance with medication regimen: Secondary | ICD-10-CM | POA: Diagnosis not present

## 2021-07-17 DIAGNOSIS — Z79899 Other long term (current) drug therapy: Secondary | ICD-10-CM | POA: Diagnosis not present

## 2021-07-17 DIAGNOSIS — I5033 Acute on chronic diastolic (congestive) heart failure: Secondary | ICD-10-CM | POA: Diagnosis present

## 2021-07-17 DIAGNOSIS — I340 Nonrheumatic mitral (valve) insufficiency: Secondary | ICD-10-CM | POA: Diagnosis present

## 2021-07-17 DIAGNOSIS — Z9049 Acquired absence of other specified parts of digestive tract: Secondary | ICD-10-CM | POA: Diagnosis not present

## 2021-07-17 DIAGNOSIS — J9601 Acute respiratory failure with hypoxia: Secondary | ICD-10-CM | POA: Diagnosis not present

## 2021-07-17 DIAGNOSIS — D696 Thrombocytopenia, unspecified: Secondary | ICD-10-CM | POA: Diagnosis present

## 2021-07-17 DIAGNOSIS — E669 Obesity, unspecified: Secondary | ICD-10-CM | POA: Diagnosis present

## 2021-07-17 DIAGNOSIS — F0390 Unspecified dementia without behavioral disturbance: Secondary | ICD-10-CM | POA: Diagnosis present

## 2021-07-17 DIAGNOSIS — Z7901 Long term (current) use of anticoagulants: Secondary | ICD-10-CM | POA: Diagnosis not present

## 2021-07-17 DIAGNOSIS — I4821 Permanent atrial fibrillation: Secondary | ICD-10-CM | POA: Diagnosis present

## 2021-07-17 DIAGNOSIS — D539 Nutritional anemia, unspecified: Secondary | ICD-10-CM | POA: Diagnosis present

## 2021-07-17 DIAGNOSIS — I959 Hypotension, unspecified: Secondary | ICD-10-CM | POA: Diagnosis not present

## 2021-07-17 DIAGNOSIS — Z515 Encounter for palliative care: Secondary | ICD-10-CM | POA: Diagnosis not present

## 2021-07-17 DIAGNOSIS — Z7189 Other specified counseling: Secondary | ICD-10-CM | POA: Diagnosis not present

## 2021-07-17 DIAGNOSIS — I081 Rheumatic disorders of both mitral and tricuspid valves: Secondary | ICD-10-CM | POA: Diagnosis present

## 2021-07-17 DIAGNOSIS — I130 Hypertensive heart and chronic kidney disease with heart failure and stage 1 through stage 4 chronic kidney disease, or unspecified chronic kidney disease: Secondary | ICD-10-CM | POA: Diagnosis present

## 2021-07-17 DIAGNOSIS — Z20822 Contact with and (suspected) exposure to covid-19: Secondary | ICD-10-CM | POA: Diagnosis present

## 2021-07-17 LAB — MAGNESIUM
Magnesium: 1.8 mg/dL (ref 1.7–2.4)
Magnesium: 1.9 mg/dL (ref 1.7–2.4)

## 2021-07-17 LAB — BASIC METABOLIC PANEL
Anion gap: 7 (ref 5–15)
BUN: 13 mg/dL (ref 8–23)
CO2: 27 mmol/L (ref 22–32)
Calcium: 8.8 mg/dL — ABNORMAL LOW (ref 8.9–10.3)
Chloride: 105 mmol/L (ref 98–111)
Creatinine, Ser: 1.1 mg/dL — ABNORMAL HIGH (ref 0.44–1.00)
GFR, Estimated: 51 mL/min — ABNORMAL LOW (ref >60)
Glucose, Bld: 105 mg/dL — ABNORMAL HIGH (ref 70–99)
Potassium: 3.9 mmol/L (ref 3.5–5.1)
Sodium: 139 mmol/L (ref 135–145)

## 2021-07-17 LAB — CBC WITH DIFFERENTIAL/PLATELET
Abs Immature Granulocytes: 0.06 10*3/uL (ref 0.00–0.07)
Basophils Absolute: 0 10*3/uL (ref 0.0–0.1)
Basophils Relative: 0 %
Eosinophils Absolute: 0 10*3/uL (ref 0.0–0.5)
Eosinophils Relative: 0 %
HCT: 34.6 % — ABNORMAL LOW (ref 36.0–46.0)
Hemoglobin: 11.1 g/dL — ABNORMAL LOW (ref 12.0–15.0)
Immature Granulocytes: 1 %
Lymphocytes Relative: 12 %
Lymphs Abs: 0.9 10*3/uL (ref 0.7–4.0)
MCH: 32.6 pg (ref 26.0–34.0)
MCHC: 32.1 g/dL (ref 30.0–36.0)
MCV: 101.5 fL — ABNORMAL HIGH (ref 80.0–100.0)
Monocytes Absolute: 1.2 10*3/uL — ABNORMAL HIGH (ref 0.1–1.0)
Monocytes Relative: 15 %
Neutro Abs: 5.7 10*3/uL (ref 1.7–7.7)
Neutrophils Relative %: 72 %
Platelets: 137 10*3/uL — ABNORMAL LOW (ref 150–400)
RBC: 3.41 MIL/uL — ABNORMAL LOW (ref 3.87–5.11)
RDW: 14.6 % (ref 11.5–15.5)
WBC: 8 10*3/uL (ref 4.0–10.5)
nRBC: 0 % (ref 0.0–0.2)

## 2021-07-17 LAB — ECHOCARDIOGRAM COMPLETE
AR max vel: 1.33 cm2
AV Area VTI: 1.21 cm2
AV Area mean vel: 1.39 cm2
AV Mean grad: 5 mmHg
AV Peak grad: 9.4 mmHg
Ao pk vel: 1.53 m/s
Height: 64 in
S' Lateral: 3.3 cm
Weight: 2539.7 oz

## 2021-07-17 LAB — TSH: TSH: 1.82 u[IU]/mL (ref 0.350–4.500)

## 2021-07-17 LAB — PROTIME-INR
INR: 3.8 — ABNORMAL HIGH (ref 0.8–1.2)
Prothrombin Time: 37.3 seconds — ABNORMAL HIGH (ref 11.4–15.2)

## 2021-07-17 LAB — FOLATE: Folate: 7.6 ng/mL (ref >5.9)

## 2021-07-17 LAB — VITAMIN B12: Vitamin B-12: 273 pg/mL (ref 180–914)

## 2021-07-17 LAB — SARS CORONAVIRUS 2 (TAT 6-24 HRS): SARS Coronavirus 2: NEGATIVE

## 2021-07-17 MED ORDER — FUROSEMIDE 10 MG/ML IJ SOLN: 20.0000 mg | Freq: Two times a day (BID) | INTRAMUSCULAR | Status: DC

## 2021-07-17 MED ORDER — SENNOSIDES-DOCUSATE SODIUM 8.6-50 MG PO TABS: 1.0000 | ORAL_TABLET | Freq: Every evening | ORAL | Status: DC | PRN

## 2021-07-17 MED ORDER — FUROSEMIDE 10 MG/ML IJ SOLN
40.0000 mg | Freq: Two times a day (BID) | INTRAMUSCULAR | Status: DC
  Administered 2021-07-17 – 2021-07-18 (×3): 40 mg via INTRAVENOUS
  Filled 2021-07-17 (×3): qty 4

## 2021-07-17 NOTE — Progress Notes (Signed)
ANTICOAGULATION CONSULT NOTE - Follow Up Consult  Pharmacy Consult for PTA warfarin Indication: atrial fibrillation  No Known Allergies  Patient Measurements: Height: 5\' 4"  (162.6 cm) Weight: 72 kg (158 lb 11.7 oz) IBW/kg (Calculated) : 54.7  Vital Signs: Temp: 98.4 F (36.9 C) (09/03 0836) Temp Source: Oral (09/03 0836) BP: 108/75 (09/03 0836) Pulse Rate: 90 (09/03 0836)  Labs: Recent Labs    Aug 03, 2021 1619 2021/08/03 1806 07/17/21 0026  HGB 11.2*  --  11.1*  HCT 35.7*  --  34.6*  PLT 148*  --  137*  LABPROT  --  38.3* 37.3*  INR  --  3.9* 3.8*  CREATININE 0.98  --  1.10*  TROPONINIHS 17 19*  --     Estimated Creatinine Clearance: 39.7 mL/min (A) (by C-G formula based on SCr of 1.1 mg/dL (H)).   Medications:  Scheduled:   diltiazem  120 mg Oral QPM   furosemide  40 mg Intravenous BID   metoprolol succinate  50 mg Oral QPM   sodium chloride flush  3 mL Intravenous Once    Assessment: 80 yo F with SOB, CXR with mild pulm edema and small R pleural effusion. PMHx of CAD, CVA (memory deficits), Afib (PTA warfarin), and HTN.   PTA warfarin regimen: 7.5mg  daily MWF, 5 mg all other days. Last dose 9/1 per med rec.  INR still elevated at 3.8 after holding warfarin 9/2. CBC stable, no bleeding noted.  Goal of Therapy:  INR 2-3 Monitor platelets by anticoagulation protocol: Yes   Plan:  Warfarin held for INR of 3.8  Daily INR, CBC Monitor for s/sx of bleeding  11/2, PharmD PGY1 Pharmacy Resident 07/17/2021  10:16 AM  Please check [email or web address] for unit-specific pharmacy phone numbers.

## 2021-07-17 NOTE — Progress Notes (Signed)
   Subjective:  [name]  Patient evaluated at bedside this AM. She states she is feeling well, denies any chest pain. Mentions breathing has improved since arrival. We further discussed code status. She reports that in the event of cardiac arrest, she would want full scope of care, including CPR, shock, medications, and intubation. Therefore will remain FULL CODE.   Objective:  Vital signs in last 24 hours: Vitals:   07/30/21 2234 30-Jul-2021 2309 07/17/21 0407 07/17/21 0836  BP:  122/84 110/62 108/75  Pulse:  86 78 90  Resp:  18 18 18   Temp: 98.8 F (37.1 C) 98.9 F (37.2 C) 98.4 F (36.9 C) 98.4 F (36.9 C)  TempSrc: Oral Oral Oral Oral  SpO2:  100% 98% 98%  Weight:  72 kg 72 kg   Height:  5\' 4"  (1.626 m)     General: Chronically ill-appearing, no acute distress CV: Regular rate, rhythm. 3/6 holosystolic murmurs appreciated throughout. Elevated JVD to mandible. Pulm: Normal work of breathing on room air. Bibasilar rales appreciated. MSK: Bilateral 2+ pitting edema with chronic venous stasis changes. Skin: Warm, dry throughout. Neuro: Awake, alert, conversing appropriately.  Assessment/Plan:  Joanna [name] is 80yo person living with valvular atrial fibrillation, chronic right heart failure, prior CVA's admitted 9/2 with acute on chronic heart failure.  Principal Problem:   Acute on chronic right heart failure (HCC) Active Problems:   Chronic atrial fibrillation   Mitral valve regurgitation  #Acute on chronic right heart failure #Severe mitral and tricuspid regurgitation Most likely etiology is medication non-adherence. Per patient's daughter (who she lives with), she takes lasix 2-3 times monthly. Cardiac biomarkers normal, ECG without acute ischemic changes. This morning, patient clinically appears improved since arriving to ED yesterday as she is currently without supplemental oxygen, sating well on room air. She received first dose of IV lasix overnight. On exam, she  continues to appear hypervolemic and will continue to require IV diuresis. Will obtain Echo (last Echo 10/2020) and continue to diurese. Will continue with home medications as well. - IV lasix 40mg  BID - Metoprolol succinate 50mg  daily - Cardizem 120mg  daily - F/u Echo - Daily BMP w/ goal K>4, Mg>2 - Strict I/O - PT/OT  #Valvular atrial fibrillation on coumadin Since initiating home metoprolol, patient's heart rate has improved to 80-90's. TSH normal. INR 3.9 on arrival, goal 2-3. Appreciate pharmacy's assistance with warfarin dosing. - Warfarin per pharmacy - Continue metoprolol succinate 50mg  daily - Cardizem 120mg  daily  #Macrocytic anemia Vitamin B12, folate normal. No signs of bleeding on exam. Likely component of anemia of chronic inflammation. Will continue to monitor CBC. - Daily CBC  DIET: HH/CM IVF: n/a DVT PPX: warfarin BOWEL: Senokot PRN CODE: FULL FAM COM: Admitting team discussed with daughter, will touch base with daughter tomorrow  Prior to Admission Living Arrangement: Home Anticipated Discharge Location: pending PT/OT Barriers to Discharge: medical management Dispo: Anticipated discharge in approximately 2-3 day(s).   11/2, MD 07/17/2021, 11:31 AM Pager: [phone] After 5pm on weekdays and 1pm on weekends: On Call pager [phone]

## 2021-07-17 NOTE — Progress Notes (Signed)
  Echocardiogram 2D Echocardiogram has been performed.  Roosvelt Maser F 07/17/2021, 10:06 AM

## 2021-07-17 NOTE — Plan of Care (Signed)

## 2021-07-18 ENCOUNTER — Inpatient Hospital Stay (HOSPITAL_COMMUNITY): Payer: Medicare Other

## 2021-07-18 DIAGNOSIS — I340 Nonrheumatic mitral (valve) insufficiency: Secondary | ICD-10-CM | POA: Diagnosis not present

## 2021-07-18 DIAGNOSIS — I361 Nonrheumatic tricuspid (valve) insufficiency: Secondary | ICD-10-CM

## 2021-07-18 DIAGNOSIS — I4821 Permanent atrial fibrillation: Secondary | ICD-10-CM

## 2021-07-18 DIAGNOSIS — I482 Chronic atrial fibrillation, unspecified: Secondary | ICD-10-CM | POA: Diagnosis not present

## 2021-07-18 DIAGNOSIS — I50813 Acute on chronic right heart failure: Secondary | ICD-10-CM | POA: Diagnosis not present

## 2021-07-18 LAB — RESPIRATORY PANEL BY PCR

## 2021-07-18 LAB — URINALYSIS, ROUTINE W REFLEX MICROSCOPIC
Bilirubin Urine: NEGATIVE
Glucose, UA: NEGATIVE mg/dL
Ketones, ur: NEGATIVE mg/dL
Nitrite: NEGATIVE
Protein, ur: NEGATIVE mg/dL
Specific Gravity, Urine: 1.009 (ref 1.005–1.030)
pH: 6 (ref 5.0–8.0)

## 2021-07-18 LAB — BASIC METABOLIC PANEL
Anion gap: 9 (ref 5–15)
BUN: 20 mg/dL (ref 8–23)
CO2: 26 mmol/L (ref 22–32)
Calcium: 8.6 mg/dL — ABNORMAL LOW (ref 8.9–10.3)
Chloride: 100 mmol/L (ref 98–111)
Creatinine, Ser: 1.19 mg/dL — ABNORMAL HIGH (ref 0.44–1.00)
GFR, Estimated: 46 mL/min — ABNORMAL LOW (ref >60)
Glucose, Bld: 100 mg/dL — ABNORMAL HIGH (ref 70–99)
Potassium: 4 mmol/L (ref 3.5–5.1)
Sodium: 135 mmol/L (ref 135–145)

## 2021-07-18 LAB — CBC
HCT: 34.1 % — ABNORMAL LOW (ref 36.0–46.0)
Hemoglobin: 10.9 g/dL — ABNORMAL LOW (ref 12.0–15.0)
MCH: 32.5 pg (ref 26.0–34.0)
MCHC: 32 g/dL (ref 30.0–36.0)
MCV: 101.8 fL — ABNORMAL HIGH (ref 80.0–100.0)
Platelets: 126 10*3/uL — ABNORMAL LOW (ref 150–400)
RBC: 3.35 MIL/uL — ABNORMAL LOW (ref 3.87–5.11)
RDW: 14.4 % (ref 11.5–15.5)
WBC: 8 10*3/uL (ref 4.0–10.5)
nRBC: 0 % (ref 0.0–0.2)

## 2021-07-18 LAB — PROTIME-INR
INR: 3.8 — ABNORMAL HIGH (ref 0.8–1.2)
Prothrombin Time: 37.1 seconds — ABNORMAL HIGH (ref 11.4–15.2)

## 2021-07-18 LAB — LACTIC ACID, PLASMA
Lactic Acid, Venous: 1.1 mmol/L (ref 0.5–1.9)
Lactic Acid, Venous: 1.7 mmol/L (ref 0.5–1.9)

## 2021-07-18 LAB — MAGNESIUM: Magnesium: 1.8 mg/dL (ref 1.7–2.4)

## 2021-07-18 MED ORDER — FUROSEMIDE 10 MG/ML IJ SOLN
40.0000 mg | Freq: Every day | INTRAMUSCULAR | Status: DC
  Administered 2021-07-19: 40 mg via INTRAVENOUS
  Filled 2021-07-18: qty 4

## 2021-07-18 MED ORDER — POLYETHYLENE GLYCOL 3350 17 G PO PACK
17.0000 g | PACK | Freq: Every day | ORAL | Status: DC
  Administered 2021-07-18 – 2021-07-25 (×4): 17 g via ORAL
  Filled 2021-07-18 (×7): qty 1

## 2021-07-18 MED ORDER — ONDANSETRON HCL 4 MG/2ML IJ SOLN
4.0000 mg | Freq: Every day | INTRAMUSCULAR | Status: DC | PRN
  Filled 2021-07-18: qty 2

## 2021-07-18 MED ORDER — DIGOXIN 125 MCG PO TABS
0.1250 mg | ORAL_TABLET | Freq: Every day | ORAL | Status: DC
  Administered 2021-07-18 – 2021-07-25 (×8): 0.125 mg via ORAL
  Filled 2021-07-18 (×8): qty 1

## 2021-07-18 MED ORDER — MAGNESIUM SULFATE 2 GM/50ML IV SOLN
2.0000 g | Freq: Once | INTRAVENOUS | Status: AC
  Administered 2021-07-18: 2 g via INTRAVENOUS
  Filled 2021-07-18: qty 50

## 2021-07-18 NOTE — Progress Notes (Signed)
ANTICOAGULATION CONSULT NOTE - Follow Up Consult  Pharmacy Consult for PTA warfarin Indication: atrial fibrillation  No Known Allergies  Patient Measurements: Height: 5\' 4"  (162.6 cm) Weight: 71.3 kg (157 lb 3 oz) IBW/kg (Calculated) : 54.7  Vital Signs: Temp: 98.5 F (36.9 C) (09/04 1057) Temp Source: Oral (09/04 1057) BP: 93/61 (09/04 1057) Pulse Rate: 73 (09/04 1057)  Labs: Recent Labs    07-17-2021 1619 07-17-2021 1806 07/17/21 0026 07/18/21 0244  HGB 11.2*  --  11.1* 10.9*  HCT 35.7*  --  34.6* 34.1*  PLT 148*  --  137* 126*  LABPROT  --  38.3* 37.3* 37.1*  INR  --  3.9* 3.8* 3.8*  CREATININE 0.98  --  1.10* 1.19*  TROPONINIHS 17 19*  --   --      Estimated Creatinine Clearance: 36.5 mL/min (A) (by C-G formula based on SCr of 1.19 mg/dL (H)).   Medications:  Scheduled:   diltiazem  120 mg Oral QPM   metoprolol succinate  50 mg Oral QPM   sodium chloride flush  3 mL Intravenous Once    Assessment: 80 yo F with SOB, CXR with mild pulm edema and small R pleural effusion. PMHx of CAD, CVA (memory deficits), Afib (PTA warfarin), and HTN.   PTA warfarin regimen: 7.5mg  daily MWF, 5 mg all other days. Last dose 9/1 per med rec.  INR still elevated at 3.8 after holding warfarin 9/2 and 9/3. CBC stable, no bleeding noted.  Goal of Therapy:  INR 2-3 Monitor platelets by anticoagulation protocol: Yes   Plan:  Warfarin held for INR of 3.8  Daily INR, CBC Monitor for s/sx of bleeding  11/3, PharmD PGY1 Pharmacy Resident 07/18/2021  11:18 AM  Please check [email or web address] for unit-specific pharmacy phone numbers.

## 2021-07-18 NOTE — Consult Note (Addendum)
Cardiology Consultation:   Patient ID: Joanna [name] MRN: [id number]; DOB: 01/26/1941  Admit date: 08/06/2021 Date of Consult: 07/18/2021  PCP:  Margot Ables, MD   [facility] Providers Cardiologist:  Donato Schultz, MD        Patient Profile:   Joanna [name] is a 80 y.o. female with a hx of chronic right heart failure, severe mitral regurgitation, severe tricuspid regurgitation, permanent atrial fibrillation on Coumadin, hypertension, obesity, hx of MCA CVA 2012, who is being seen 07/18/2021 for the evaluation of decompensated heart failure at the request of [name].   History of Present Illness:   [name] follows [name] outpatient primarily. She had a severe MR and TR, was referred to structural heart team and saw [name] in the past, had declined surgical interventions.  She is medically managed Lasix 40 mg daily.  She is chronically anticoagulated on Coumadin for permanent atrial fibrillation and rate controlled on diltiazem.  Last outpatient echo was done 10/22/2020 which revealed EF 55%, no regional wall motion abnormality, significant RV failure with volume overload, severe LA dilation massive RA dilatation, moderate to severe MR, severe TR.  It was reportedly that she had prior LV dysfunction that EF has improved, although never was on ideal GDMT.  She was last seen in the office on 03/17/2021 by [name], she had NYHA class I heart failure symptoms, doing fairly well, skips Lasix occasionally, continue to not wish any valve replacement.  Patient presented to the ER on 07/17/2019 evening complaining increased shortness of breath, centrally located chest pain, increased lower extremity swelling over the past couple weeks.  She had taken Lasix x2 doses which did not seem to help her symptoms.  Upon encounter, patient appears to be a poor historian, alert and oriented to place/person, states she came to the hospital because her legs has been swollen for couple days.   She does not recall having any chest pain or shortness of breath.  She was able to ambulate at home independently, does not recall any significant dyspnea.  She denies any dizziness or syncope.  She states that she has been urinating a lot and felt her leg swelling is improving.  She is unable to elaborate how often she takes Lasix at home.  She states that she does not wish any surgical intervention for her heart valves.  Physical therapy at bedside, reports patient did stand and a few steps ambulation within the room, appeared unsteady with gait, had drop in oxygen to 85% on room air.   Admission agnostic revealed grossly unremarkable BMP.  CBC revealed hemoglobin 11.2 and platelet 148k.  High sensitive troponin 17>19.  BNP elevated 1307.  INR 3.9.  TSH WNL.  COVID-19 negative. Chest x-ray revealed cardiomegaly, mild pulmonary edema and small right pleural effusion. EKG revealed atrial fibrillation with ventricular rate of 97, generalized ST depression of all leads unchanged.  Is concerned for decompensated heart failure started on IV Lasix 40 mg twice daily and admitted to hospital medicine service.  Echocardiogram repeated 07/17/2021 which revealed LVEF 55 to 60%, no RWMA, grade 1 DD, RV pressure and volume overload, normal PASP with RVSP estimated 34.9 mmHg. severe LA dilatation.  Massive RA dilatation.  Small pericardial effusion.  Moderate to severe MR.  Severe TR.  Moderate PR.  Overall no significant change from prior study.  Cardiology consult is requested for further input today    Past Medical History:  Diagnosis Date   Acute renal failure (HCC) 02/23/2007-03/22/2007  Now with normalization back to baseline   Aphasia 2012   CHF (congestive heart failure) (HCC)    Chronic atrial fibrillation (HCC)    Chronic kidney disease    Dementia (HCC)    Diverticulosis    Dysnomia 12/2010   Hypertension    Ischemic bowel disease (HCC)    With GI bleeding   Left ventricular dysfunction    EF is  now 50-55% as of 2012   Obesity    Splenic infarct    Stroke (HCC) 12/2010   Now back on coumadin    Past Surgical History:  Procedure Laterality Date   COLECTOMY  12/2010   partial   ILEOSTOMY     OMENTECTOMY     partial with ileostomy     Home Medications:  Prior to Admission medications   Medication Sig Start Date End Date Taking? Authorizing Provider  acetaminophen (TYLENOL) 500 MG tablet Take 1,000 mg by mouth every 6 (six) hours as needed for headache or mild pain.   Yes [provider]  diltiazem (CARDIZEM CD) 120 MG 24 hr capsule Take 1 capsule (120 mg total) by mouth daily. Patient taking differently: Take 120 mg by mouth every evening. 03/17/21  Yes Jake BatheSkains, Mark C, MD  furosemide (LASIX) 40 MG tablet Take 1 tablet (40 mg total) by mouth daily. Patient taking differently: Take 40 mg by mouth daily as needed for fluid. 09/07/20  Yes Rosalio MacadamiaGerhardt, Lori C, NP  metoprolol succinate (TOPROL-XL) 50 MG 24 hr tablet Take 1 tablet (50 mg total) by mouth daily. Take with or immediately following a meal. Patient taking differently: Take 50 mg by mouth every evening. Take with or immediately following a meal. 05/20/21  Yes Skains, Veverly FellsMark C, MD  warfarin (COUMADIN) 5 MG tablet TAKE 1 TO 1 & 1/2 (ONE & ONE-HALF) TABLETS BY MOUTH ONCE DAILY AS DIRECTED BY COUMADIN CLINIC Patient taking differently: No sig reported 06/30/21  Yes Jake BatheSkains, Mark C, MD    Inpatient Medications: Scheduled Meds:  digoxin  0.125 mg Oral Daily   metoprolol succinate  50 mg Oral QPM   sodium chloride flush  3 mL Intravenous Once   Continuous Infusions:  PRN Meds: acetaminophen **OR** acetaminophen, senna-docusate  Allergies:   No Known Allergies  Social History:   Social History   Socioeconomic History   Marital status: Widowed    Spouse name: Not on file   Number of children: 3   Years of education: Not on file   Highest education level: Not on file  Occupational History    Employer: RETIRED   Tobacco Use   Smoking status: Never   Smokeless tobacco: Never  Vaping Use   Vaping Use: Never used  Substance and Sexual Activity   Alcohol use: No   Drug use: No   Sexual activity: Not on file  Other Topics Concern   Not on file  Social History Narrative   Not on file   Social Determinants of Health   Financial Resource Strain: Not on file  Food Insecurity: Not on file  Transportation Needs: Not on file  Physical Activity: Not on file  Stress: Not on file  Social Connections: Not on file  Intimate Partner Violence: Not on file    Family History:    Family History  Problem Relation Age of Onset   Heart disease Mother    Hypertension Sister      ROS:  Constitutional: febrile this AM  Eyes: Denied vision change or loss Ears/Nose/Mouth/Throat: Denied  ear ache, sore throat, sinus pain Cardiovascular: See HPI Respiratory: See HPI Gastrointestinal: Denied nausea, vomiting, abdominal pain, diarrhea Genital/Urinary: Denied dysuria, hematuria, urinary frequency/urgency Musculoskeletal: Denied muscle ache, joint pain Skin: Denied rash, wound Neuro: Denied headache, dizziness, syncope Psych: Denied history of depression/anxiety  Endocrine: Denied history of diabetes    Physical Exam/Data:   Vitals:   07/17/21 2039 07/18/21 0436 07/18/21 0933 07/18/21 1057  BP: 118/68 106/65 (!) 95/54 93/61  Pulse: (!) 107 85 71 73  Resp: Temp: 98.3 F (36.8 C) (!) 100.9 F (38.3 C) 99 F (37.2 C) 98.5 F (36.9 C)  TempSrc: Oral Oral Oral Oral  SpO2: 96% 97% 99% 100%  Weight:  71.3 kg    Height:        Intake/Output Summary (Last 24 hours) at 07/18/2021 1248 Last data filed at 07/18/2021 0900 Gross per 24 hour  Intake 360 ml  Output 1350 ml  Net -990 ml   Last 3 Weights 07/18/2021 07/17/2021 2021-07-17  Weight (lbs) 157 lb 3 oz 158 lb 11.7 oz 158 lb 11.7 oz  Weight (kg) 71.3 kg 72 kg 72 kg     Body mass index is 26.98 kg/m.   Vitals:  Vitals:   07/18/21 0933  07/18/21 1057  BP: (!) 95/54 93/61  Pulse: 71 73  Resp: 18 16  Temp: 99 F (37.2 C) 98.5 F (36.9 C)  SpO2: 99% 100%   General Appearance: In no apparent distress, sitting in recliner HEENT: Normocephalic, atraumatic.  Neck: Supple, trachea midline, JVD 10cm while sitting at 90 degree  Cardiovascular: Irregularly irregular, grade III murmur noted at apex Respiratory: Resting breathing unlabored, lungs sounds clear but diminished at base to auscultation bilaterally, no use of accessory muscles.  2 L nasal cannula oxygen with pulse ox 95 Gastrointestinal: Bowel sounds positive, abdomen soft, non-tender, non-distended.  Extremities: Able to move all extremities without difficulty, 1+ edema of bilateral lower extremity  Genitourinary: External suction Foley in place, urine amber color Musculoskeletal: Generalized muscular atrophy, no focal weakness  Skin: Intact, warm, dry. No rashes or petechiae noted in exposed areas.  Neurologic: Alert, oriented to person, place. Slow speech, mild memory and cognitive deficit, no gross focal neuro deficit, follow commands appropriately Psychiatric: Normal affect. Mood is appropriate.    EKG:  The EKG was personally reviewed and demonstrates: Atrial fibrillation with ventricular rate of 97, generalized ST depression of all leads unchanged.  Telemetry:  Telemetry was personally reviewed and demonstrates: Persistent atrial fibrillation with rate 80-110s, occasional PVCs   Relevant CV Studies:  Echocardiogram from 07/17/2021:   1. Left ventricular ejection fraction, by estimation, is 55 to 60%. The  left ventricle has normal function. The left ventricle has no regional  wall motion abnormalities. Left ventricular diastolic parameters are  consistent with Grade I diastolic  dysfunction (impaired relaxation). There is the interventricular septum is  flattened in systole and diastole, consistent with right ventricular  pressure and volume overload.   2.  Right ventricular systolic function is mildly reduced. The right  ventricular size is severely enlarged. There is normal pulmonary artery  systolic pressure. The estimated right ventricular systolic pressure is  34.9 mmHg.   3. Left atrial size was severely dilated.   4. Right atrial size was massively dilated.   5. A small pericardial effusion is present. The pericardial effusion is  localized near the right atrium.   6. The mitral valve is normal in structure. Moderate to severe mitral  valve regurgitation. No evidence of mitral stenosis.   7. Tricuspid valve regurgitation is severe.   8. The aortic valve is normal in structure. Aortic valve regurgitation is  not visualized. No aortic stenosis is present.   9. Pulmonic valve regurgitation is moderate.  10. The inferior vena cava is dilated in size with <50% respiratory  variability, suggesting right atrial pressure of 15 mmHg.   Comparison(s): No significant change from prior study. Prior images  reviewed side by side.     Laboratory Data:  High Sensitivity Troponin:   Recent Labs  Lab 08-02-21 1619 2021/08/02 1806  TROPONINIHS 17 19*     Chemistry Recent Labs  Lab Aug 02, 2021 1619 07/17/21 0026 07/18/21 0244  NA 140 139 135  K 3.6 3.9 4.0  CL 106 105 100  CO2 25 27 26   GLUCOSE 95 105* 100*  BUN 12 13 20   CREATININE 0.98 1.10* 1.19*  CALCIUM 8.7* 8.8* 8.6*  GFRNONAA 58* 51* 46*  ANIONGAP 9 7 9     No results for input(s): PROT, ALBUMIN, AST, ALT, ALKPHOS, BILITOT in the last 168 hours. Hematology Recent Labs  Lab 08/02/2021 1619 07/17/21 0026 07/18/21 0244  WBC 6.4 8.0 8.0  RBC 3.46* 3.41* 3.35*  HGB 11.2* 11.1* 10.9*  HCT 35.7* 34.6* 34.1*  MCV 103.2* 101.5* 101.8*  MCH 32.4 32.6 32.5  MCHC 31.4 32.1 32.0  RDW 14.6 14.6 14.4  PLT 148* 137* 126*   BNP Recent Labs  Lab Aug 02, 2021 1623  BNP 1,307.3*    DDimer No results for input(s): DDIMER in the last 168 hours.   Radiology/Studies:  DG Chest Port 1  View  Result Date: 07/18/2021 CLINICAL DATA:  Chest pain and shortness of breath EXAM: PORTABLE CHEST 1 VIEW COMPARISON:  Radiograph 08-02-21, chest CT 10/07/2018 FINDINGS: Unchanged, markedly enlarged cardiac silhouette. Unchanged mild pulmonary edema. Decreased small right pleural effusion. No visible pneumothorax. Bones are unchanged. IMPRESSION: Unchanged marked cardiomegaly with pulmonary vascular congestion/mild edema. Decreased small right pleural effusion. Electronically Signed   By: 09/17/2021 M.D.   On: 07/18/2021 11:06   DG Chest Portable 1 View  Result Date: 2021/08/02 CLINICAL DATA:  Chest pain EXAM: PORTABLE CHEST 1 VIEW COMPARISON:  Radiograph 10/06/2018, chest CT 10/07/2018 FINDINGS: Unchanged markedly enlarged cardiac silhouette. Aortic calcifications. There is mild diffuse interstitial opacities and thickening of the minor fissure. There is a small right pleural effusion. There is no visible pneumothorax. Bilateral shoulder degenerative changes. No acute osseous abnormality. Thoracic spondylosis. IMPRESSION: Unchanged markedly enlarged heart. Mild pulmonary edema and small right pleural effusion. Electronically Signed   By: 09/15/2021 M.D.   On: 08/02/2021 16:54   ECHOCARDIOGRAM COMPLETE  Result Date: 07/17/2021    ECHOCARDIOGRAM REPORT   Patient Name:   Anhar A Seth Date of Exam: 07/17/2021 Medical Rec #:  09/15/2021        Height:       64.0 in Accession #:    09/16/2021       Weight:       158.7 lb Date of Birth:  30-Sep-1941       BSA:          1.773 m Patient Age:    79 years         BP:           108/75 mmHg Patient Gender: F                HR:  77 bpm. Exam Location:  Inpatient Procedure: 2D Echo, Cardiac Doppler and Color Doppler Indications:    CHF I 50.9  History:        Patient has prior history of Echocardiogram examinations, most                 recent 10/22/2020.  Sonographer:    Roosvelt Maser RDCS Referring Phys: [phone] SCOTT GOLDSTON IMPRESSIONS  1. Left ventricular  ejection fraction, by estimation, is 55 to 60%. The left ventricle has normal function. The left ventricle has no regional wall motion abnormalities. Left ventricular diastolic parameters are consistent with Grade I diastolic dysfunction (impaired relaxation). There is the interventricular septum is flattened in systole and diastole, consistent with right ventricular pressure and volume overload.  2. Right ventricular systolic function is mildly reduced. The right ventricular size is severely enlarged. There is normal pulmonary artery systolic pressure. The estimated right ventricular systolic pressure is 34.9 mmHg.  3. Left atrial size was severely dilated.  4. Right atrial size was massively dilated.  5. A small pericardial effusion is present. The pericardial effusion is localized near the right atrium.  6. The mitral valve is normal in structure. Moderate to severe mitral valve regurgitation. No evidence of mitral stenosis.  7. Tricuspid valve regurgitation is severe.  8. The aortic valve is normal in structure. Aortic valve regurgitation is not visualized. No aortic stenosis is present.  9. Pulmonic valve regurgitation is moderate. 10. The inferior vena cava is dilated in size with <50% respiratory variability, suggesting right atrial pressure of 15 mmHg. Comparison(s): No significant change from prior study. Prior images reviewed side by side. FINDINGS  Left Ventricle: Left ventricular ejection fraction, by estimation, is 55 to 60%. The left ventricle has normal function. The left ventricle has no regional wall motion abnormalities. The left ventricular internal cavity size was normal in size. There is  no left ventricular hypertrophy. The interventricular septum is flattened in systole and diastole, consistent with right ventricular pressure and volume overload. Left ventricular diastolic parameters are consistent with Grade I diastolic dysfunction (impaired relaxation). Right Ventricle: The right ventricular  size is severely enlarged. No increase in right ventricular wall thickness. Right ventricular systolic function is mildly reduced. There is normal pulmonary artery systolic pressure. The tricuspid regurgitant velocity is 2.23 m/s, and with an assumed right atrial pressure of 15 mmHg, the estimated right ventricular systolic pressure is 34.9 mmHg. Left Atrium: Left atrial size was severely dilated. Right Atrium: Right atrial size was massively dilated. Pericardium: A small pericardial effusion is present. The pericardial effusion is localized near the right atrium. Mitral Valve: The mitral valve is normal in structure. Moderate to severe mitral valve regurgitation, with anteriorly-directed jet. No evidence of mitral valve stenosis. Tricuspid Valve: The tricuspid valve is normal in structure. Tricuspid valve regurgitation is severe. No evidence of tricuspid stenosis. Aortic Valve: The aortic valve is normal in structure. Aortic valve regurgitation is not visualized. No aortic stenosis is present. Aortic valve mean gradient measures 5.0 mmHg. Aortic valve peak gradient measures 9.4 mmHg. Aortic valve area, by VTI measures 1.21 cm. Pulmonic Valve: The pulmonic valve was normal in structure. Pulmonic valve regurgitation is moderate. No evidence of pulmonic stenosis. Aorta: The aortic root is normal in size and structure. Venous: The inferior vena cava is dilated in size with less than 50% respiratory variability, suggesting right atrial pressure of 15 mmHg. IAS/Shunts: No atrial level shunt detected by color flow Doppler.  LEFT VENTRICLE PLAX 2D LVIDd:  4.50 cm LVIDs:         3.30 cm LV PW:         0.90 cm LV IVS:        0.90 cm LVOT diam:     2.00 cm LV SV:         30 LV SV Index:   17 LVOT Area:     3.14 cm  RIGHT VENTRICLE            IVC RV Basal diam:  6.10 cm    IVC diam: 5.20 cm RV Mid diam:    4.80 cm RV S prime:     9.57 cm/s TAPSE (M-mode): 1.0 cm LEFT ATRIUM              Index       RIGHT ATRIUM            Index LA diam:        5.10 cm  2.88 cm/m  RA Area:     65.90 cm LA Vol (A2C):   110.0 ml 62.03 ml/m RA Volume:   364.00 ml 205.26 ml/m LA Vol (A4C):   100.0 ml 56.39 ml/m LA Biplane Vol: 115.0 ml 64.85 ml/m  AORTIC VALVE AV Area (Vmax):    1.33 cm AV Area (Vmean):   1.39 cm AV Area (VTI):     1.21 cm AV Vmax:           153.00 cm/s AV Vmean:          101.000 cm/s AV VTI:            0.251 m AV Peak Grad:      9.4 mmHg AV Mean Grad:      5.0 mmHg LVOT Vmax:         64.60 cm/s LVOT Vmean:        44.800 cm/s LVOT VTI:          0.097 m LVOT/AV VTI ratio: 0.39  AORTA Ao Root diam: 3.20 cm Ao Asc diam:  2.30 cm TRICUSPID VALVE TR Peak grad:   19.9 mmHg TR Vmax:        223.00 cm/s  SHUNTS Systemic VTI:  0.10 m Systemic Diam: 2.00 cm Donato Schultz MD Electronically signed by Donato Schultz MD Signature Date/Time: 07/17/2021/12:59:03 PM    Final      Assessment and Plan:   Acute hypoxic respiratory failure -Wean off oxygen as tolerated  Acute on chronic right heart failure Severe MR/TR -Presented with several weeks worsening SOB, chest pain, leg swelling, pt denied SOB and CP today  -Chest x-ray with mild pulmonary edema and small pleural effusion, poa - BNP>1300, poa - HS trop negative x2 - Echo from 07/17/2021 with preserved EF, RV pressure and volume overload, severe MR and TR, no significant change in comparison -Started on IV Lasix 40 mg twice daily, reportedly she takes Lasix at home as needed, UOP 1350 mL over the past 24 hours. net - , weight is 164 >158 >157 ibs since admission -Clinically improving with hypervolemia, recommend reduce to IV Lasix to  daily today, continue monitor renal index and electrolytes daily  -She continues to refuse interventions for underlying valvular disease, also not interested in RHC/procedures   Permanent atrial fibrillation Supratherapeutic INR  -Remains in atrial fibrillation, rate controlled on diltiazem CD120 mg and metoprolol XL 50 mg, will DC  Diltiazem today and start digoxin 0.125mg  daily for rate control to allow BP room, check digoxin level ion 07/21/21 8AM  -  CHA2DS2-VASc score is 7 due to CHF, hypertension, stroke, age, gender; continue chronic anticoagulation with Coumadin,  INR is supratherapeutic at 3.8 today, no clinical bleeding, pharmacy to dose Coumadin  HTN -Blood pressure is currently low normal 93/61 -106/65 -DC PO Cardizem CD while giving IV diuresis to allow BP room  -Given fever of 38.3 early morning today, consider infectious work-up, defer to IM   Fever of unknown origin Anemia Thrombocytopenia History of CVA -Managed per IM    Risk Assessment/Risk Scores:    New York Heart Association (NYHA) Functional Class NYHA Class II  CHA2DS2-VASc Score = 7  This indicates a 11.2% annual risk of stroke. [name] score is based upon: CHF History: Yes HTN History: Yes Diabetes History: No Stroke History: Yes Vascular Disease History: No Age Score: 2 Gender Score: 1       For questions or updates, please contact [facility] Please consult [email or web address] for contact info under    Signed, Cyndi Bender, NP  07/18/2021 12:48 PM  I have seen and examined the patient along with Cyndi Bender, NP .  I have reviewed the chart, notes and new data.  I agree with PA/NP's note.  Key new complaints: She is delightful, but is clearly having some early cognitive issues (her son, Alinda Money confirmed this). Denies dyspnea at this time and does not acknowledge complaining of this earlier. Edema was her big problem and this is much better. Key examination changes: JVP to angle of jaw, 2+ bilateral pitting edema to the knees, but with some wrinkles developing, irregular rhythm, apical and LLSB holosystolic murmurs, clear lungs. Developing mild hypotension. Key new findings / data: echo reviewed. Although LVEF is in "normal" range, the EF of 55% is mildly decreased for the severity of MR (highly eccentric jet). More impressive is  the severe RV dysfunction with marked tricuspid annulus dilation and torrential TR. The right atrium and the inferior vena cava are massively dilated. BNP is high and above previous levels.  PLAN: End-stage valvular heart disease with R>L biventricular heart failure. Preload dependent RV function explains her tendency to hypotension. Slow down diuretic to once daily dosing. Stop diltiazem, replace with digoxin, tolerate HR up to 100 bpm at rest. Check dig level in a few days. I think we are beyond the point where aggressive interventions would help her. The major driver of her illness now is severe RV dysfunction. Even if we fixed her valve issues, that problem would remain. Her quality of life is likely to decline in the next few years with advancing age and cognitive decline. I spoke with her daughter Donia Guiles and her son Ron by phone and suggested a gradual transition towards a palliative care approach. I think a formal palliative care consultation would be a good idea, after this holiday weekend.  Thurmon Fair, MD, [facility] [phone] 07/18/2021, 1:03 PM

## 2021-07-18 NOTE — Progress Notes (Addendum)
HD#1 SUBJECTIVE:  Patient Summary: Joanna [name] is a 80 y.o. with a pertinent PMH of valvular atrial fibrillation, chronic right heart failure with mitral and tricuspid regurgitation, prior CVA's, who presented with increasing shortness of breath and leg swelling and admitted for acute on chronic heart failure.   Overnight Events: Joanna [name] had a fever of 100.9 overnight.  She states that she feels ok, but remains notably warm and her pillow is damp from sweat.  Patient remains on 3L [place].   OBJECTIVE:  Vital Signs: Vitals:   07/17/21 1136 07/17/21 1731 07/17/21 2039 07/18/21 0436  BP: 104/67 123/72 118/68 106/65  Pulse: 70 99 (!) 107 85  Resp:  18 18 17   Temp: 97.7 F (36.5 C) 97.7 F (36.5 C) 98.3 F (36.8 C) (!) 100.9 F (38.3 C)  TempSrc: Oral Oral Oral Oral  SpO2: 93% 98% 96% 97%  Weight:    71.3 kg  Height:       Supplemental O2: Nasal Cannula SpO2: 97 % O2 Flow Rate (L/min): 3 L/min  Filed Weights   07/19/2021 2309 07/17/21 0407 07/18/21 0436  Weight: 72 kg 72 kg 71.3 kg     Intake/Output Summary (Last 24 hours) at 07/18/2021 0645 Last data filed at 07/18/2021 0447 Gross per 24 hour  Intake 480 ml  Output 1350 ml  Net -870 ml   Net IO Since Admission: -1,270 mL [07/18/21 0645]  Physical Exam: General: well-developed, well-nourished, ill-appearing HEENT: NCAT, no rashes noted Eyes: sclera non-icteric, conjunctiva clear CV: Holosystolic murmur present, normal rate Pulm: rhonchi noted in R>L, pulmonary effort normal GI: no fluid distension, bowel sounds present GU: purewick in place MSK: 1+ pitting edema in lower extremities bilaterally, chronic venous stasis changes present Skin: warm and dry throughout Neuro: Awake, alert, conversing appropriately   Patient Lines/Drains/Airways Status     Active Line/Drains/Airways     Name Placement date Placement time Site Days   Peripheral IV 08/03/2021 20 G Left Antecubital 07/15/2021  1556  Antecubital  2    External Urinary Catheter 08/04/2021  2310  --  2            Pertinent Labs: CBC Latest Ref Rng & Units 07/18/2021 07/17/2021 07/17/2021  WBC 4.0 - 10.5 K/uL 8.0 8.0 6.4  Hemoglobin 12.0 - 15.0 g/dL 10.9(L) 11.1(L) 11.2(L)  Hematocrit 36.0 - 46.0 % 34.1(L) 34.6(L) 35.7(L)  Platelets 150 - 400 K/uL 126(L) 137(L) 148(L)    CMP Latest Ref Rng & Units 07/18/2021 07/17/2021 07/19/2021  Glucose 70 - 99 mg/dL 09/15/2021) 657(Q) 95  BUN 8 - 23 mg/dL 20 13 12   Creatinine 0.44 - 1.00 mg/dL 469(G) ) 2.95(M  Sodium 135 - 145 mmol/L 135 139 140  Potassium 3.5 - 5.1 mmol/L 4.0 3.9 3.6  Chloride 98 - 111 mmol/L 100 105 106  CO2 22 - 32 mmol/L 26 27 25   Calcium 8.9 - 10.3 mg/dL 8.41(L) 2.44) )  Total Protein 6.0 - 8.5 g/dL - - -  Total Bilirubin 0.0 - 1.2 mg/dL - - -  Alkaline Phos 39 - 117 IU/L - - -  AST 0 - 40 IU/L - - -  ALT 0 - 32 IU/L - - -    No results for input(s): GLUCAP in the last 72 hours.   Pertinent Imaging: Echo 07/17/21: EF 55-60%, Right ventricular systolic function is mildly  reduced, Left atrial size was severely dilated, right atrial massively dilated, Moderate to severe mitral valve regurgitation and Tricuspid valve regurgitation is  severe.  ASSESSMENT/PLAN:  Assessment: Principal Problem:   Acute on chronic right heart failure (HCC) Active Problems:   Chronic atrial fibrillation   Mitral valve regurgitation   Plan: #Acute on Chronic HF exacerbation with preserved EF #Severe mitral regurgitation and tricuspid regurgitation Patient presented with a history of two weeks of increasing SOB and leg swelling in the setting of known severe mitral and tricuspid regurgitation that she does not wish to have further interventions/procedures on. Since admission -1,150 mL, weight remains elevated at 71.3 kg this am.  Dry weight is 62 kg.  Patient received once dose of lasix this am, and holding second dose due to MAP trending down.  -IV Lasix 40 mg once today, holding second due to  patient's MAP of 68 -Echo showed preserved EF with severe mitral and tricuspid regurgitation as seen on previous Echo -Continue home medications: Cardizem 120 mg daily, metoprolol succinate 50 mg daily -daily BMP, K>4, MG>2 -Magnesium chloride 64 mg once -Strict I/Os -PT/OT  #Fever Patient had fever of 100.9 overnight with resolution after tylenol.  She was able to converse normally in room, but appeared more fatigued.  On auscultation rhonchi appreciated R>L. In setting of acute on chronic CHF, difficult to discern if due to fluid retention vs infective etiology.  Will repeat CXR.  Patient denies dysuria and currently is using purewick. -Follow-up on Blood and Urine cultures -Follow-up on U/A -Respiratory viral panel  #Valvular atrial fibrillation on coumadin Since initiating home metoprolol, patient's heart rate has improved to 80-90's. TSH normal. INR 3.9 on arrival, goal 2-3. Appreciate pharmacy's assistance with warfarin dosing. - Warfarin per pharmacy - Continue metoprolol succinate 50mg  daily - Cardizem 120mg  daily   #Macrocytic anemia#thrombocytopenia Hbg downtrending fom 11.2-->11,1->10.9, Platelet count of 126K.  Folate wnl, Vitamin B-12 wnl -Daily CBC   Best Practice: Diet: Cardiac diet IVF: Fluids: none VTE:  Code: Full AB: none Family Contact: will call and update daughter today DISPO: Anticipated discharge in 2-3 days to Home pending Medical stability.  Signature: , D.O. Internal Medicine Resident, PGY-1 Internal Medicine Residency  Pager: [phone] 6:45 AM, 07/18/2021   Please contact the on call pager after 5 pm and on weekends at [phone].

## 2021-07-18 NOTE — Evaluation (Signed)
Physical Therapy Evaluation Patient Details Name: Joanna [name] MRN: [id number] DOB: [birth year] Today's Date: 07/18/2021   History of Present Illness  Pt is an 80 y.o. female who presented 07/31/2021 with increased SOB. Pt admitted with volume overload, acute on chronic right heart failure complicated by mitral and tricuspid regurgitation, as well as atrial fibrillation. She has not been taking Lasix the past few months with noted subsequent lower extremity edema. PMH of HFpEF, severe mitral and tricuspid regurgitation, atrial fibrillation on coumadin, HTN, and prior CVAs   Clinical Impression  Pt presents with condition above and deficits mentioned below, see PT Problem List. PTA, she reports she was functioning independently. Later, she reported she has begun to use a RW intermittently. She lives with family in a 1-level house with 4 STE with bil handrails. Currently, pt is displaying a posterior bias impacting her balance and resulting in her requiring modA for transfers and to take several side steps with bil UE support this date. She has difficulty remaining on task and sequencing. Pt also demonstrates deficits in activity tolerance, aerobic endurance, and general strength that place her at risk for falls. Recommend follow-up with short-term rehab at a SNF to maximize her independence and safety with functional mobility prior to return home as her family is available to assist only intermittently per pt. Will continue to follow acutely.    Follow Up Recommendations SNF;Supervision/Assistance - 24 hour    Equipment Recommendations  3in1 (PT)    Recommendations for Other Services       Precautions / Restrictions Precautions Precautions: Fall Precaution Comments: monitor SpO2 Restrictions Weight Bearing Restrictions: No      Mobility  Bed Mobility Overal bed mobility: Needs Assistance Bed Mobility: Supine to Sit     Supine to sit: Min guard     General bed mobility comments: Bed  flat to simulate home. Pt needing extensive period of time to transition to sit and scoot to EOB, displaying a posterior lean needing cues to correct intermittently.    Transfers Overall transfer level: Needs assistance Equipment used: Rolling walker (2 wheeled);1 person hand held assist Transfers: Sit to/from [employer] Sit to Stand: Mod assist Stand pivot transfers: Mod assist       General transfer comment: Sit to stand 1x with pt holding onto PT and then 1x with RW, needing modA to power up, steady, and counteract posterior lean. ModA to steady and prevent posterior LOB due to posterior bias and to direct pt to R with stand step transfer bed > recliner.  Ambulation/Gait Ambulation/Gait assistance: Mod assist Gait Distance (Feet): 3 Feet (x2 bouts of ~3 ft each) Assistive device: 1 person hand held assist;Rolling walker (2 wheeled) Gait Pattern/deviations: Step-through pattern;Decreased stride length;Decreased weight shift to right;Decreased weight shift to left;Shuffle;Leaning posteriorly Gait velocity: reduced Gait velocity interpretation: <1.31 ft/sec, indicative of household ambulator General Gait Details: Initially attempting to ambulate without UE support to simulate home but pt unable to hold balance statically in stand without assistance, thus cued pt to hold onto PT and follow, needing modA to cue weight shifting, direct, and counteract posterior bias. 2nd attempt with RW with same needs and modA.  Stairs            Wheelchair Mobility    Modified Rankin (Stroke Patients Only)       Balance Overall balance assessment: Needs assistance Sitting-balance support: No upper extremity supported;Feet supported Sitting balance-Leahy Scale: Fair Sitting balance - Comments: Posterior lean intermittently, needing cues to correct.  Otherwise supervision for safety with static sitting EOB. Postural control: Posterior lean Standing balance support: Bilateral  upper extremity supported;During functional activity Standing balance-Leahy Scale: Poor Standing balance comment: Reliant on bil UE support and up to modA for standing.                             Pertinent Vitals/Pain Pain Assessment: Faces Faces Pain Scale: No hurt Pain Intervention(s): Monitored during session    Home Living Family/patient expects to be discharged to:: Private residence Living Arrangements: Children Available Help at Discharge: Family;Available PRN/intermittently Type of Home: House Home Access: Stairs to enter Entrance Stairs-Rails: Can reach both Entrance Stairs-Number of Steps: 4 Home Layout: One level Home Equipment: Toilet riser;Grab bars - tub/shower;Walker - 2 wheels      Prior Function Level of Independence: Independent         Comments: Pt reports she does not drive anymore. Pt independent with standin showers ADLs, and mobility without AD. Pt later reported she did have a RW and was beginning to use it intermittently, may be poor historian.     Hand Dominance   Dominant Hand: Right    Extremity/Trunk Assessment   Upper Extremity Assessment Upper Extremity Assessment: Defer to OT evaluation    Lower Extremity Assessment Lower Extremity Assessment: Generalized weakness    Cervical / Trunk Assessment Cervical / Trunk Assessment: Kyphotic  Communication   Communication: No difficulties  Cognition Arousal/Alertness: Awake/alert Behavior During Therapy: WFL for tasks assessed/performed Overall Cognitive Status: No family/caregiver present to determine baseline cognitive functioning Area of Impairment: Attention;Memory;Following commands;Safety/judgement;Awareness;Problem solving                   Current Attention Level: Sustained Memory: Decreased short-term memory Following Commands: Follows one step commands consistently;Follows one step commands with increased time Safety/Judgement: Decreased awareness of  safety;Decreased awareness of deficits Awareness: Emergent Problem Solving: Slow processing;Decreased initiation;Difficulty sequencing;Requires verbal cues;Requires tactile cues General Comments: No family present to confirm prior cognitive status. Pt with STM deficits, needing reminders to continue performing task at hand often. Poor sequencing, needing multi-modal cues. Poor awareness into deficits and safety, displaying a posterior bias.      General Comments General comments (skin integrity, edema, etc.): SpO2 down to 85% with RA-1L O2, increased to 3L with return to >/= 90%    Exercises     Assessment/Plan    PT Assessment Patient needs continued PT services  PT Problem List Decreased strength;Decreased range of motion;Decreased activity tolerance;Decreased balance;Decreased mobility;Decreased coordination;Decreased cognition;Decreased knowledge of use of DME;Decreased safety awareness;Cardiopulmonary status limiting activity       PT Treatment Interventions DME instruction;Gait training;Stair training;Functional mobility training;Therapeutic activities;Therapeutic exercise;Balance training;Neuromuscular re-education;Cognitive remediation;Patient/family education    PT Goals (Current goals can be found in the Care Plan section)  Acute Rehab PT Goals Patient Stated Goal: to improve PT Goal Formulation: With patient Time For Goal Achievement: 08/01/21 Potential to Achieve Goals: Good    Frequency Min 2X/week   Barriers to discharge        Co-evaluation               AM-PAC PT "6 Clicks" Mobility  Outcome Measure Help needed turning from your back to your side while in a flat bed without using bedrails?: A Little Help needed moving from lying on your back to sitting on the side of a flat bed without using bedrails?: A Little Help needed moving to and from a  bed to a chair (including a wheelchair)?: A Lot Help needed standing up from a chair using your arms (e.g.,  wheelchair or bedside chair)?: A Lot Help needed to walk in hospital room?: A Lot Help needed climbing 3-5 steps with a railing? : Total 6 Click Score: 13    End of Session Equipment Utilized During Treatment: Gait belt;Oxygen Activity Tolerance: Patient tolerated treatment well Patient left: in chair;with call bell/phone within reach;with chair alarm set Nurse Communication: Mobility status PT Visit Diagnosis: Unsteadiness on feet (R26.81);Other abnormalities of gait and mobility (R26.89);Muscle weakness (generalized) (M62.81);Difficulty in walking, not elsewhere classified (R26.2)    Time: 1200-1228 PT Time Calculation (min) (ACUTE ONLY): 28 min   Charges:   PT Evaluation $PT Eval Moderate Complexity: 1 Mod PT Treatments $Therapeutic Activity: 8-22 mins        Raymond Gurney, PT, DPT Acute Rehabilitation Services  Pager: [phone] Office: [phone]   Jewel Baize 07/18/2021, 1:54 PM

## 2021-07-19 DIAGNOSIS — I50813 Acute on chronic right heart failure: Secondary | ICD-10-CM | POA: Diagnosis not present

## 2021-07-19 LAB — BASIC METABOLIC PANEL
Anion gap: 10 (ref 5–15)
BUN: 23 mg/dL (ref 8–23)
CO2: 26 mmol/L (ref 22–32)
Calcium: 8.7 mg/dL — ABNORMAL LOW (ref 8.9–10.3)
Chloride: 99 mmol/L (ref 98–111)
Creatinine, Ser: 1.16 mg/dL — ABNORMAL HIGH (ref 0.44–1.00)
GFR, Estimated: 48 mL/min — ABNORMAL LOW (ref >60)
Glucose, Bld: 115 mg/dL — ABNORMAL HIGH (ref 70–99)
Potassium: 3.7 mmol/L (ref 3.5–5.1)
Sodium: 135 mmol/L (ref 135–145)

## 2021-07-19 LAB — CBC
HCT: 34.2 % — ABNORMAL LOW (ref 36.0–46.0)
Hemoglobin: 10.9 g/dL — ABNORMAL LOW (ref 12.0–15.0)
MCH: 32.2 pg (ref 26.0–34.0)
MCHC: 31.9 g/dL (ref 30.0–36.0)
MCV: 101.2 fL — ABNORMAL HIGH (ref 80.0–100.0)
Platelets: 151 10*3/uL (ref 150–400)
RBC: 3.38 MIL/uL — ABNORMAL LOW (ref 3.87–5.11)
RDW: 14.2 % (ref 11.5–15.5)
WBC: 6.6 10*3/uL (ref 4.0–10.5)
nRBC: 0 % (ref 0.0–0.2)

## 2021-07-19 LAB — PROTIME-INR
INR: 2.4 — ABNORMAL HIGH (ref 0.8–1.2)
Prothrombin Time: 26.4 seconds — ABNORMAL HIGH (ref 11.4–15.2)

## 2021-07-19 LAB — MAGNESIUM: Magnesium: 2.2 mg/dL (ref 1.7–2.4)

## 2021-07-19 MED ORDER — WARFARIN SODIUM 7.5 MG PO TABS: 7.5000 mg | ORAL_TABLET | Freq: Once | ORAL | Status: DC

## 2021-07-19 MED ORDER — FUROSEMIDE 10 MG/ML IJ SOLN
40.0000 mg | Freq: Two times a day (BID) | INTRAMUSCULAR | Status: DC
  Administered 2021-07-19 – 2021-07-20 (×2): 40 mg via INTRAVENOUS
  Filled 2021-07-19 (×2): qty 4

## 2021-07-19 MED ORDER — WARFARIN - PHARMACIST DOSING INPATIENT: Freq: Every day | Status: DC

## 2021-07-19 MED ORDER — POTASSIUM CHLORIDE CRYS ER 20 MEQ PO TBCR
40.0000 meq | EXTENDED_RELEASE_TABLET | Freq: Two times a day (BID) | ORAL | Status: DC
  Administered 2021-07-19 – 2021-07-21 (×5): 40 meq via ORAL
  Filled 2021-07-19 (×5): qty 2

## 2021-07-19 MED ORDER — WARFARIN SODIUM 5 MG PO TABS
5.0000 mg | ORAL_TABLET | Freq: Once | ORAL | Status: AC
  Administered 2021-07-19: 5 mg via ORAL
  Filled 2021-07-19: qty 1

## 2021-07-19 NOTE — Progress Notes (Signed)
Physical Therapy Treatment Patient Details Name: Joanna [name] MRN: [id number] DOB: 08/09/1941 Today's Date: 07/19/2021    History of Present Illness Pt is an 80 y.o. female who presented 07/20/2021 with increased SOB. Pt admitted with volume overload, acute on chronic right heart failure complicated by mitral and tricuspid regurgitation, as well as atrial fibrillation. She has not been taking Lasix the past few months with noted subsequent lower extremity edema. PMH of HFpEF, severe mitral and tricuspid regurgitation, atrial fibrillation on coumadin, HTN, and prior CVAs    PT Comments    Pt making good progress today.  She was able to ambulate 41' with RW and min A but mod cues.  Pt needs cues for sequencing and safety.  HR up to 135 bpm with ambulation but O2 stable on 2 L. Daughter present and very supportive.   Continue plan of care.    Follow Up Recommendations  SNF;Supervision/Assistance - 24 hour     Equipment Recommendations  3in1 (PT)    Recommendations for Other Services       Precautions / Restrictions Precautions Precautions: Fall Precaution Comments: monitor SpO2    Mobility  Bed Mobility Overal bed mobility: Needs Assistance Bed Mobility: Supine to Sit;Sit to Supine     Supine to sit: Min assist Sit to supine: Min assist   General bed mobility comments: increased time and cues    Transfers Overall transfer level: Needs assistance Equipment used: Rolling walker (2 wheeled) Transfers: Sit to/from Stand Sit to Stand: Min assist Stand pivot transfers: Mod assist       General transfer comment: Min A to rise; cues for hand placement  Ambulation/Gait Ambulation/Gait assistance: Min assist Gait Distance (Feet): 50 Feet Assistive device: Rolling walker (2 wheeled) Gait Pattern/deviations: Step-to pattern;Decreased stride length;Trunk flexed Gait velocity: reduced   General Gait Details: Multiple cues for RW proximity and posture.  Tends to drift R  requiring multiple episodes of assist to correct RW   Stairs             Wheelchair Mobility    Modified Rankin (Stroke Patients Only)       Balance Overall balance assessment: Needs assistance Sitting-balance support: No upper extremity supported;Feet supported Sitting balance-Leahy Scale: Good     Standing balance support: Bilateral upper extremity supported Standing balance-Leahy Scale: Poor Standing balance comment: Requiring RW but was stable with RW for ADLs, no posterior lean today                            Cognition Arousal/Alertness: Awake/alert Behavior During Therapy: Flat affect Overall Cognitive Status: Within Functional Limits for tasks assessed Area of Impairment: Attention;Memory;Following commands;Safety/judgement;Awareness;Problem solving                   Current Attention Level: Sustained Memory: Decreased short-term memory Following Commands: Follows one step commands consistently;Follows one step commands with increased time Safety/Judgement: Decreased awareness of safety;Decreased awareness of deficits Awareness: Emergent Problem Solving: Slow processing;Decreased initiation;Difficulty sequencing;Requires verbal cues;Requires tactile cues General Comments: Did need repeated cues and cues for sequencing      Exercises      General Comments General comments (skin integrity, edema, etc.): O2 sats stable on 2 L O2; HR 100 bpm rest up to 135 max with ambulation      Pertinent Vitals/Pain Pain Assessment: Faces Faces Pain Scale: No hurt    Home Living Family/patient expects to be discharged to:: Private residence Living Arrangements: Children  Available Help at Discharge: Family;Available PRN/intermittently Type of Home: House Home Access: Stairs to enter Entrance Stairs-Rails: Can reach both Home Layout: One level Home Equipment: Toilet riser;Grab bars - tub/shower;Walker - 2 wheels      Prior Function Level of  Independence: Independent with assistive device(s)      Comments: pt is a poor historian, reports using a RW and being independent in ADL and light meal prep   PT Goals (current goals can now be found in the care plan section) Acute Rehab PT Goals Patient Stated Goal: pt prefers home; dtr states still may need SNF first PT Goal Formulation: With patient/family Progress towards PT goals: Progressing toward goals    Frequency    Min 2X/week      PT Plan Current plan remains appropriate    Co-evaluation              AM-PAC PT "6 Clicks" Mobility   Outcome Measure  Help needed turning from your back to your side while in a flat bed without using bedrails?: A Little Help needed moving from lying on your back to sitting on the side of a flat bed without using bedrails?: A Little Help needed moving to and from a bed to a chair (including a wheelchair)?: A Little Help needed standing up from a chair using your arms (e.g., wheelchair or bedside chair)?: A Little Help needed to walk in hospital room?: A Lot Help needed climbing 3-5 steps with a railing? : Total 6 Click Score: 15    End of Session Equipment Utilized During Treatment: Gait belt;Oxygen Activity Tolerance: Patient tolerated treatment well Patient left: with call bell/phone within reach;in bed;with bed alarm set Nurse Communication: Mobility status PT Visit Diagnosis: Unsteadiness on feet (R26.81);Other abnormalities of gait and mobility (R26.89);Muscle weakness (generalized) (M62.81);Difficulty in walking, not elsewhere classified (R26.2)     Time: 1540-1606 PT Time Calculation (min) (ACUTE ONLY): 26 min  Charges:  $Gait Training: 8-22 mins $Therapeutic Activity: 8-22 mins                     Anise Salvo, PT Acute Rehab Services Pager [phone] [facility] [phone]    Rayetta Humphrey 07/19/2021, 4:16 PM

## 2021-07-19 NOTE — Evaluation (Signed)
Occupational Therapy Evaluation Patient Details Name: Joanna [name] MRN: [id number] DOB: [birth year] Today's Date: 07/19/2021    History of Present Illness Pt is an 80 y.o. female who presented 08/02/2021 with increased SOB. Pt admitted with volume overload, acute on chronic right heart failure complicated by mitral and tricuspid regurgitation, as well as atrial fibrillation. She has not been taking Lasix the past few months with noted subsequent lower extremity edema. PMH of HFpEF, severe mitral and tricuspid regurgitation, atrial fibrillation on coumadin, HTN, and prior CVAs   Clinical Impression   Pt reports living with her daughter who can provide intermittent supervision and walking with a RW. She states she was independent in self care and light meal preparation. No family available to determine accuracy of pt's report. Pt presents with generalized weakness, decreased standing balance and impaired cognition. She was incontinent of bowel upon standing. Pt requires mod assist to stand and transfer x 2. She needs min to total assist for ADL. Recommending SNF for ST rehab. Will follow acutely.     Follow Up Recommendations  SNF;Supervision/Assistance - 24 hour    Equipment Recommendations       Recommendations for Other Services       Precautions / Restrictions Precautions Precautions: Fall Precaution Comments: monitor SpO2      Mobility Bed Mobility Overal bed mobility: Needs Assistance Bed Mobility: Sit to Supine       Sit to supine: Mod assist   General bed mobility comments: assist for LEs back into bed    Transfers Overall transfer level: Needs assistance Equipment used: Rolling walker (2 wheeled) Transfers: Sit to/from [employer] Sit to Stand: Mod assist Stand pivot transfers: Mod assist       General transfer comment: assist to rise and steady, posterior bias, attempts to sit before she is aligned with sitting surface    Balance Overall  balance assessment: Needs assistance   Sitting balance-Leahy Scale: Fair     Standing balance support: Bilateral upper extremity supported;During functional activity Standing balance-Leahy Scale: Poor Standing balance comment: Reliant on bil UE support and up to modA for standing.                           ADL either performed or assessed with clinical judgement   ADL Overall ADL's : Needs assistance/impaired Eating/Feeding: Set up;Sitting   Grooming: Oral care;Sitting;Min guard   Upper Body Bathing: Moderate assistance;Sitting   Lower Body Bathing: Total assistance;Sit to/from stand   Upper Body Dressing : Minimal assistance;Sitting   Lower Body Dressing: Total assistance;Sit to/from stand   Toilet Transfer: Moderate assistance;Stand-pivot;BSC;RW   Toileting- Clothing Manipulation and Hygiene: Total assistance;Sit to/from stand Toileting - Clothing Manipulation Details (indicate cue type and reason): pt with bowel incontinence             Vision Baseline Vision/History: 1 Wears glasses Patient Visual Report: No change from baseline       Perception     Praxis      Pertinent Vitals/Pain Pain Assessment: Faces Faces Pain Scale: No hurt     Hand Dominance Right   Extremity/Trunk Assessment Upper Extremity Assessment Upper Extremity Assessment: Generalized weakness   Lower Extremity Assessment Lower Extremity Assessment: Defer to PT evaluation   Cervical / Trunk Assessment Cervical / Trunk Assessment: Kyphotic   Communication Communication Communication: No difficulties   Cognition Arousal/Alertness: Awake/alert Behavior During Therapy: WFL for tasks assessed/performed Overall Cognitive Status: No family/caregiver present to determine  baseline cognitive functioning Area of Impairment: Attention;Memory;Following commands;Safety/judgement;Awareness;Problem solving                   Current Attention Level: Sustained Memory: Decreased  short-term memory Following Commands: Follows one step commands consistently;Follows one step commands with increased time Safety/Judgement: Decreased awareness of safety;Decreased awareness of deficits Awareness: Emergent Problem Solving: Slow processing;Decreased initiation;Difficulty sequencing;Requires verbal cues;Requires tactile cues     General Comments       Exercises     Shoulder Instructions      Home Living Family/patient expects to be discharged to:: Private residence Living Arrangements: Children Available Help at Discharge: Family;Available PRN/intermittently Type of Home: House Home Access: Stairs to enter [employer] of Steps: 4 Entrance Stairs-Rails: Can reach both Home Layout: One level     Bathroom Shower/Tub: Chief Strategy Officer: Standard (with riser)     Home Equipment: Toilet riser;Grab bars - tub/shower;Walker - 2 wheels          Prior Functioning/Environment Level of Independence: Independent with assistive device(s)        Comments: pt is a poor historian, reports using a RW and being independent in ADL and light meal prep        OT Problem List: Decreased strength;Decreased activity tolerance;Impaired balance (sitting and/or standing);Decreased safety awareness;Decreased cognition;Decreased knowledge of use of DME or AE      OT Treatment/Interventions: Self-care/ADL training;DME and/or AE instruction;Therapeutic activities;Cognitive remediation/compensation;Patient/family education;Balance training    OT Goals(Current goals can be found in the care plan section) Acute Rehab OT Goals Patient Stated Goal: to go home OT Goal Formulation: With patient Time For Goal Achievement: 08/02/21 Potential to Achieve Goals: Fair ADL Goals Pt Will Perform Grooming: with min assist;standing (one activity) Pt Will Perform Upper Body Bathing: with min assist;sitting Pt Will Perform Upper Body Dressing: with min  assist;sitting Pt Will Transfer to Toilet: with min assist;bedside commode;ambulating Pt Will Perform Toileting - Clothing Manipulation and hygiene: with mod assist;sit to/from stand Additional ADL Goal #1: Pt will perform sit<>stand with min guard assist in preparation for ADL. Additional ADL Goal #2: Pt will perform bed mobility with min assist in preparation for ADL.  OT Frequency: Min 2X/week   Barriers to D/C:            Co-evaluation              AM-PAC OT "6 Clicks" Daily Activity     Outcome Measure Help from another person eating meals?: A Little Help from another person taking care of personal grooming?: A Little Help from another person toileting, which includes using toliet, bedpan, or urinal?: Total Help from another person bathing (including washing, rinsing, drying)?: A Lot Help from another person to put on and taking off regular upper body clothing?: A Little Help from another person to put on and taking off regular lower body clothing?: Total 6 Click Score: 13   End of Session Equipment Utilized During Treatment: Gait belt;Rolling walker  Activity Tolerance: Patient tolerated treatment well Patient left: in bed;with call bell/phone within reach;with bed alarm set  OT Visit Diagnosis: Unsteadiness on feet (R26.81);Other abnormalities of gait and mobility (R26.89);Muscle weakness (generalized) (M62.81);Other symptoms and signs involving cognitive function                Time: 1400-1423 OT Time Calculation (min): 23 min Charges:  OT General Charges $OT Visit: 1 Visit OT Evaluation $OT Eval Moderate Complexity: 1 Mod OT Treatments $Self Care/Home Management :  8-22 mins  Martie Round, OTR/L Acute Rehabilitation Services Pager: [phone] Office: [phone]   Evern Bio 07/19/2021, 2:31 PM

## 2021-07-19 NOTE — Progress Notes (Addendum)
ANTICOAGULATION CONSULT NOTE - Follow Up Consult  Pharmacy Consult for PTA warfarin Indication: atrial fibrillation  No Known Allergies  Patient Measurements: Height: 5\' 4"  (162.6 cm) Weight: 72 kg (158 lb 11.7 oz) IBW/kg (Calculated) : 54.7  Vital Signs: Temp: 98.2 F (36.8 C) (09/05 0530) Temp Source: Oral (09/05 0530) BP: 119/87 (09/05 0530) Pulse Rate: 109 (09/05 0530)  Labs: Recent Labs    07/29/2021 1619 08/07/2021 1806 08/08/2021 1806 07/17/21 0026 07/18/21 0244 07/19/21 0143  HGB 11.2*  --   --  11.1* 10.9* 10.9*  HCT 35.7*  --   --  34.6* 34.1* 34.2*  PLT 148*  --   --  137* 126* 151  LABPROT  --  38.3*   < > 37.3* 37.1* 26.4*  INR  --  3.9*   < > 3.8* 3.8* 2.4*  CREATININE 0.98  --   --  1.10* 1.19* 1.16*  TROPONINIHS 17 19*  --   --   --   --    < > = values in this interval not displayed.     Estimated Creatinine Clearance: 37.6 mL/min (A) (by C-G formula based on SCr of 1.16 mg/dL (H)).   Medications:  Scheduled:   digoxin  0.125 mg Oral Daily   furosemide  40 mg Intravenous Daily   metoprolol succinate  50 mg Oral QPM   polyethylene glycol  17 g Oral Daily   potassium chloride  40 mEq Oral BID   sodium chloride flush  3 mL Intravenous Once    Assessment: 80 yo F with SOB, CXR with mild pulm edema and small R pleural effusion. PMHx of CAD, CVA (memory deficits), Afib (PTA warfarin), and HTN.   PTA warfarin regimen: 7.5mg  daily MWF, 5 mg all other days. Last dose 9/1 per med rec.  INR is now therapeutic at 2.4 after holding warfarin for three days (admit INR of 3.9). CBC stable, no bleeding noted.  Goal of Therapy:  INR 2-3 Monitor platelets by anticoagulation protocol: Yes   Plan:  Give warfarin 5 mg x1 (lower dose than home regimen d/t unknown cause of INR elevation - will continue to monitor)  Daily INR, CBC Monitor for s/sx of bleeding  11/1, PharmD PGY1 Pharmacy Resident 07/19/2021  8:36 AM  Please check [email or web address] for  unit-specific pharmacy phone numbers.

## 2021-07-19 NOTE — TOC Initial Note (Addendum)
Transition of Care [facility]) - Initial/Assessment Note    Patient Details  Name: Joanna [name] MRN: [id number] Date of Birth: 1941/06/13  [facility]) CM/SW Contact:    Terrial Rhodes, LCSWA Phone Number: 07/19/2021, 12:51 PM  Clinical Narrative:                  CSW received consult for possible SNF placement at time of discharge. CSW spoke with patient regarding PT recommendation of SNF placement at time of discharge. Patient expressed understanding of PT recommendation and wants CSW to follow back up with her in am with dc plan.  CSW will follow back up with patient in the am.No further questions reported at this time. CSW to continue to follow and assist with discharge planning needs.  Expected Discharge Plan:  (to be determined)     Patient Goals and CMS Choice Patient states their goals for this hospitalization and ongoing recovery are:: to be determined CMS Medicare.gov Compare Post Acute Care list provided to:: Patient Choice offered to / list presented to : Patient  Expected Discharge Plan and Services Expected Discharge Plan:  (to be determined) In-house Referral: Clinical Social Work     Living arrangements for the past 2 months: Homeless                                      Prior Living Arrangements/Services Living arrangements for the past 2 months: Homeless   Patient language and need for interpreter reviewed:: Yes Do you feel safe going back to the place where you live?:  (will follow back with patient still deciding)      Need for Family Participation in Patient Care: Yes (Comment) Care giver support system in place?: Yes (comment)   Criminal Activity/Legal Involvement Pertinent to Current Situation/Hospitalization: No - Comment as needed  Activities of Daily Living Home Assistive Devices/Equipment: Walker (specify type) (front wheel) ADL Screening (condition at time of admission) Patient's cognitive ability adequate to safely  complete daily activities?: Yes Is the patient deaf or have difficulty hearing?: No Does the patient have difficulty seeing, even when wearing glasses/contacts?: Yes Does the patient have difficulty concentrating, remembering, or making decisions?: No Patient able to express need for assistance with ADLs?: No Does the patient have difficulty dressing or bathing?: No Independently performs ADLs?: Yes (appropriate for developmental age) Does the patient have difficulty walking or climbing stairs?: Yes Weakness of Legs: None Weakness of Arms/Hands: None  Permission Sought/Granted                  Emotional Assessment   Attitude/Demeanor/Rapport: Gracious Affect (typically observed): Calm Orientation: : Oriented to Self, Oriented to Place, Oriented to  Time, Oriented to Situation (WDL) Alcohol / Substance Use: Not Applicable Psych Involvement: No (comment)  Admission diagnosis:  Acute exacerbation of CHF (congestive heart failure) (HCC) [I50.9] Acute on chronic congestive heart failure, unspecified heart failure type [facility]) [I50.9] Patient Active Problem List   Diagnosis Date Noted   Mitral valve regurgitation 07/17/2021   Acute on chronic right heart failure (HCC) August 02, 2021   Chronic kidney disease    Hypertension    Chronic atrial fibrillation 10/26/2015   Encounter for therapeutic drug monitoring 01/03/2014   Left ventricular dysfunction    Atrial fibrillation (HCC) 02/04/2011   PCP:  Margot Ables, MD Pharmacy:   [facility] [address] [place] - [address]  [address] [place] [address] Phone: [phone] Fax: [phone]     Social Determinants of Health (SDOH) Interventions    Readmission Risk Interventions No flowsheet data found.

## 2021-07-19 NOTE — Progress Notes (Signed)
HD#2 SUBJECTIVE:  Patient Summary:  Joanna [name] is a 80 y.o. with a pertinent PMH of valvular atrial fibrillation, chronic right heart failure with mitral and tricuspid regurgitation, prior CVA's, who presented with increasing shortness of breath and leg swelling and admitted for acute on chronic heart failure.   Overnight Events: Afebrile overnight.  Patient states that she is feeling better after a bowel movement.   OBJECTIVE:  Vital Signs: Vitals:   07/18/21 1057 07/18/21 1655 07/18/21 1719 07/18/21 1955  BP: 93/61  118/73 (!) 129/91  Pulse: 73 81 88 90  Resp: 16  18 18   Temp: 98.5 F (36.9 C)  97.9 F (36.6 C) 97.8 F (36.6 C)  TempSrc: Oral  Oral Oral  SpO2: 100%  93% 97%  Weight:      Height:       Supplemental O2: Nasal Cannula SpO2: 97 % O2 Flow Rate (L/min): 2 L/min  Filed Weights   2021-08-04 2309 07/17/21 0407 07/18/21 0436  Weight: 72 kg 72 kg 71.3 kg     Intake/Output Summary (Last 24 hours) at 07/19/2021 0505 Last data filed at 07/19/2021 0042 Gross per 24 hour  Intake 420 ml  Output 500 ml  Net -80 ml   Net IO Since Admission: -1,350 mL [07/19/21 0505]  Physical Exam: General: Well-developed, well-nourished, in no acute distress HEENT: NCAT, conjunctiva clear CV: 3/6 Holosystolic murmur, no rubs, or gallops, Irregularly irregular mildly tachycardic Pulmonary: CTAB, normal pulmonary effort GI: no tenderness, bowel sounds present MSK: 1+ pitting edema in legs bilaterally, chronic venous stasis changes Psych: normal mood and affect  Patient Lines/Drains/Airways Status     Active Line/Drains/Airways     Name Placement date Placement time Site Days   Peripheral IV 2021-08-04 20 G Left Antecubital 04-Aug-2021  1556  Antecubital  3   External Urinary Catheter 07/18/21  1806  --  1            Pertinent Labs: CBC Latest Ref Rng & Units 07/19/2021 07/18/2021 07/17/2021  WBC 4.0 - 10.5 K/uL 6.6 8.0 8.0  Hemoglobin 12.0 - 15.0 g/dL 10.9(L) 10.9(L)  11.1(L)  Hematocrit 36.0 - 46.0 % 34.2(L) 34.1(L) 34.6(L)  Platelets 150 - 400 K/uL 151 126(L) 137(L)    CMP Latest Ref Rng & Units 07/19/2021 07/18/2021 07/17/2021  Glucose 70 - 99 mg/dL 09/16/2021) 130(Q) 657(Q)  BUN 8 - 23 mg/dL 23 20 13   Creatinine 0.44 - 1.00 mg/dL 469(G) ) 2.95(M)  Sodium 135 - 145 mmol/L 135 135 139  Potassium 3.5 - 5.1 mmol/L 3.7 4.0 3.9  Chloride 98 - 111 mmol/L 99 100 105  CO2 22 - 32 mmol/L 26 26 27   Calcium 8.9 - 10.3 mg/dL 8.41(L) 2.44(W) )  Total Protein 6.0 - 8.5 g/dL - - -  Total Bilirubin 0.0 - 1.2 mg/dL - - -  Alkaline Phos 39 - 117 IU/L - - -  AST 0 - 40 IU/L - - -  ALT 0 - 32 IU/L - - -    No results for input(s): GLUCAP in the last 72 hours.   Pertinent Imaging: DG Chest Port 1 View  Result Date: 07/18/2021 CLINICAL DATA:  Chest pain and shortness of breath EXAM: PORTABLE CHEST 1 VIEW COMPARISON:  Radiograph 08-04-2021, chest CT 10/07/2018 FINDINGS: Unchanged, markedly enlarged cardiac silhouette. Unchanged mild pulmonary edema. Decreased small right pleural effusion. No visible pneumothorax. Bones are unchanged. IMPRESSION: Unchanged marked cardiomegaly with pulmonary vascular congestion/mild edema. Decreased small right pleural effusion. Electronically Signed  By: Caprice Renshaw M.D.   On: 07/18/2021 11:06    ASSESSMENT/PLAN:  Assessment: Principal Problem:   Acute on chronic right heart failure (HCC) Active Problems:   Chronic atrial fibrillation   Mitral valve regurgitation   Plan: #Acute on Chronic HF exacerbation with preserved EF #Severe mitral regurgitation and tricuspid regurgitation Patient presented with a history of two weeks of increasing SOB and leg swelling in the setting of known severe mitral and tricuspid regurgitation that she does not wish to have further interventions/procedures on.  On 9/4, her blood pressure was trending down with lowest MAP of 68.  IV Lasix 40 mg decreased to once a day with urine output of .  She  also had a fever of 100.9, with WBC 8.0-->6.6.  Blood culture with no growth to date and urine grew out E. coli 100,000 colonies.  However patient has remained afebrile, denies dysuria, with white blood cell count of 6.6. We will continue to monitor.  Patient remains volume overloaded, but is pre-load dependent making diuresis difficult.  Cardiology consulted and recommended stopping home medication of Diltazem 120 mg and changing it to digoxin.  Blood pressure now elevated at MAP of 104.   At this point, patient is likely past the point that surgical interventions would be beneficial due to severe RV dysfunction.  Discussed goals of care with patient and her daughter, [name], and son, Alinda Money.  Patient states that she would like to remain at home.  PT evaluated patient and their recommendations are for short-term rehab.  At this point patient does not seem to want to go to rehab or to have help in the home such as home health due to concerns of losing her independence.   -IV Lasix 40 mg twice daily -Echo showed preserved EF with severe mitral and tricuspid regurgitation as seen on previous Echo -home medications: Discontinued Cardizem 120 mg daily and continued metoprolol succinate 50 mg daily -started digoxin -daily BMP, K>4, MG>2 -Magnesium chloride 64 mg once -Strict I/Os -PT/OT -digoxin levels -Work on clarifying goals of care  #Valvular atrial fibrillation on coumadin Since initiating home metoprolol, patient's heart rate has improved to 80-90's. TSH normal. INR 3.9 on arrival, goal 2-3. Appreciate pharmacy's assistance with warfarin dosing. - Warfarin per pharmacy - Continue metoprolol succinate 50mg  daily - discontinue Cardizem 120mg  daily - start digoxin and will check levels on 09/07   #Macrocytic anemia#thrombocytopenia Hbg downtrending fom 11.2-->11,1->10.9, Platelet count of 126K.  Folate wnl, Vitamin B-12 wnl -Daily CBC  Best Practice: Diet: Cardiac diet IVF: Fluids: none VTE:  warfarin Code: Full AB: none Family Contact: [name] and , her daughter and son DISPO: Anticipated discharge in 3-5 days to Home pending Medical stability.  Signature: 11/07, D.O. Internal Medicine Resident, PGY-1 [facility] Res[facility]  Pager: [phone] 5:05 AM, 07/19/2021   Please contact the on call pager after 5 pm and on weekends at [phone].

## 2021-07-19 NOTE — Progress Notes (Signed)
Progress Note  Patient Name: Joanna [name] Date of Encounter: 07/19/2021  [facility] Cardiologist: Donato Schultz, MD   Subjective   Laying in bed fairly comfortable, decreased shortness of breath, no chest pain  Inpatient Medications    Scheduled Meds:  digoxin  0.125 mg Oral Daily   furosemide  40 mg Intravenous Daily   metoprolol succinate  50 mg Oral QPM   polyethylene glycol  17 g Oral Daily   potassium chloride  40 mEq Oral BID   sodium chloride flush  3 mL Intravenous Once   warfarin  5 mg Oral ONCE-1600   Warfarin - Pharmacist Dosing Inpatient   Does not apply q1600   Continuous Infusions:  PRN Meds: acetaminophen **OR** acetaminophen, ondansetron (ZOFRAN) IV, senna-docusate   Vital Signs    Vitals:   07/18/21 1719 07/18/21 1955 07/19/21 0530 07/19/21 0843  BP: 118/73 (!) 129/91 119/87 112/72  Pulse: 88 90 (!) 109   Resp: 18 18 18 16   Temp: 97.9 F (36.6 C) 97.8 F (36.6 C) 98.2 F (36.8 C)   TempSrc: Oral Oral Oral   SpO2: 93% 97% 96% 95%  Weight:   72 kg   Height:        Intake/Output Summary (Last 24 hours) at 07/19/2021 0920 Last data filed at 07/19/2021 0845 Gross per 24 hour  Intake 360 ml  Output 510 ml  Net -150 ml   Last 3 Weights 07/19/2021 07/18/2021 07/17/2021  Weight (lbs) 158 lb 11.7 oz 157 lb 3 oz 158 lb 11.7 oz  Weight (kg) 72 kg 71.3 kg 72 kg      Telemetry    Atrial fibrillation heart rate approximately 100- Personally Reviewed  ECG    No new- Personally Reviewed  Physical Exam   GEN: No acute distress.   Neck: No JVD Cardiac: Irregularly irregular mildly tachycardic, 3/6 holosystolic murmur, no rubs, or gallops.  Respiratory: Clear to auscultation bilaterally. GI: Soft, nontender, non-distended  MS: Improved lower extremity edema; No deformity. Neuro:  Nonfocal  Psych: Normal affect   Labs    High Sensitivity Troponin:   Recent Labs  Lab 2021/08/01 1619 2021-08-01 1806  TROPONINIHS 17 19*      Chemistry Recent  Labs  Lab 07/17/21 0026 07/18/21 0244 07/19/21 0143  NA 139 135 135  K 3.9 4.0 3.7  CL 105 100 99  CO2 27 26 26   GLUCOSE 105* 100* 115*  BUN 13 20 23   CREATININE 1.10* 1.19* 1.16*  CALCIUM 8.8* 8.6* 8.7*  GFRNONAA 51* 46* 48*  ANIONGAP 7 9 10      Hematology Recent Labs  Lab 07/17/21 0026 07/18/21 0244 07/19/21 0143  WBC 8.0 8.0 6.6  RBC 3.41* 3.35* 3.38*  HGB 11.1* 10.9* 10.9*  HCT 34.6* 34.1* 34.2*  MCV 101.5* 101.8* 101.2*  MCH 32.6 32.5 32.2  MCHC 32.1 32.0 31.9  RDW 14.6 14.4 14.2  PLT 137* 126* 151    BNP Recent Labs  Lab 08-01-21 1623  BNP 1,307.3*     DDimer No results for input(s): DDIMER in the last 168 hours.   Radiology    DG Chest Port 1 View  Result Date: 07/18/2021 CLINICAL DATA:  Chest pain and shortness of breath EXAM: PORTABLE CHEST 1 VIEW COMPARISON:  Radiograph Aug 01, 2021, chest CT 10/07/2018 FINDINGS: Unchanged, markedly enlarged cardiac silhouette. Unchanged mild pulmonary edema. Decreased small right pleural effusion. No visible pneumothorax. Bones are unchanged. IMPRESSION: Unchanged marked cardiomegaly with pulmonary vascular congestion/mild edema. Decreased small right pleural  effusion. Electronically Signed   By: Caprice Renshaw M.D.   On: 07/18/2021 11:06   ECHOCARDIOGRAM COMPLETE  Result Date: 07/17/2021    ECHOCARDIOGRAM REPORT   Patient Name:   Joanna [name] Date of Exam: 07/17/2021 Medical Rec #:  [id number]        Height:       64.0 in Accession #:    [id number]       Weight:       158.7 lb Date of Birth:  1941-05-03       BSA:          1.773 m Patient Age:    79 years         BP:           108/75 mmHg Patient Gender: F                HR:           77 bpm. Exam Location:  Inpatient Procedure: 2D Echo, Cardiac Doppler and Color Doppler Indications:    CHF I 50.9  History:        Patient has prior history of Echocardiogram examinations, most                 recent 10/22/2020.  Sonographer:    Roosvelt Maser RDCS Referring Phys: [phone] SCOTT  [place] IMPRESSIONS  1. Left ventricular ejection fraction, by estimation, is 55 to 60%. The left ventricle has normal function. The left ventricle has no regional wall motion abnormalities. Left ventricular diastolic parameters are consistent with Grade I diastolic dysfunction (impaired relaxation). There is the interventricular septum is flattened in systole and diastole, consistent with right ventricular pressure and volume overload.  2. Right ventricular systolic function is mildly reduced. The right ventricular size is severely enlarged. There is normal pulmonary artery systolic pressure. The estimated right ventricular systolic pressure is 34.9 mmHg.  3. Left atrial size was severely dilated.  4. Right atrial size was massively dilated.  5. A small pericardial effusion is present. The pericardial effusion is localized near the right atrium.  6. The mitral valve is normal in structure. Moderate to severe mitral valve regurgitation. No evidence of mitral stenosis.  7. Tricuspid valve regurgitation is severe.  8. The aortic valve is normal in structure. Aortic valve regurgitation is not visualized. No aortic stenosis is present.  9. Pulmonic valve regurgitation is moderate. 10. The inferior vena cava is dilated in size with <50% respiratory variability, suggesting right atrial pressure of 15 mmHg. Comparison(s): No significant change from prior study. Prior images reviewed side by side. FINDINGS  Left Ventricle: Left ventricular ejection fraction, by estimation, is 55 to 60%. The left ventricle has normal function. The left ventricle has no regional wall motion abnormalities. The left ventricular internal cavity size was normal in size. There is  no left ventricular hypertrophy. The interventricular septum is flattened in systole and diastole, consistent with right ventricular pressure and volume overload. Left ventricular diastolic parameters are consistent with Grade I diastolic dysfunction (impaired  relaxation). Right Ventricle: The right ventricular size is severely enlarged. No increase in right ventricular wall thickness. Right ventricular systolic function is mildly reduced. There is normal pulmonary artery systolic pressure. The tricuspid regurgitant velocity is 2.23 m/s, and with an assumed right atrial pressure of 15 mmHg, the estimated right ventricular systolic pressure is 34.9 mmHg. Left Atrium: Left atrial size was severely dilated. Right Atrium: Right atrial size was massively dilated. Pericardium: A small pericardial effusion is  present. The pericardial effusion is localized near the right atrium. Mitral Valve: The mitral valve is normal in structure. Moderate to severe mitral valve regurgitation, with anteriorly-directed jet. No evidence of mitral valve stenosis. Tricuspid Valve: The tricuspid valve is normal in structure. Tricuspid valve regurgitation is severe. No evidence of tricuspid stenosis. Aortic Valve: The aortic valve is normal in structure. Aortic valve regurgitation is not visualized. No aortic stenosis is present. Aortic valve mean gradient measures 5.0 mmHg. Aortic valve peak gradient measures 9.4 mmHg. Aortic valve area, by VTI measures 1.21 cm. Pulmonic Valve: The pulmonic valve was normal in structure. Pulmonic valve regurgitation is moderate. No evidence of pulmonic stenosis. Aorta: The aortic root is normal in size and structure. Venous: The inferior vena cava is dilated in size with less than 50% respiratory variability, suggesting right atrial pressure of 15 mmHg. IAS/Shunts: No atrial level shunt detected by color flow Doppler.  LEFT VENTRICLE PLAX 2D LVIDd:         4.50 cm LVIDs:         3.30 cm LV PW:         0.90 cm LV IVS:        0.90 cm LVOT diam:     2.00 cm LV SV:         30 LV SV Index:   17 LVOT Area:     3.14 cm  RIGHT VENTRICLE            IVC RV Basal diam:  6.10 cm    IVC diam: 5.20 cm RV Mid diam:    4.80 cm RV S prime:     9.57 cm/s TAPSE (M-mode): 1.0 cm LEFT  ATRIUM              Index       RIGHT ATRIUM           Index LA diam:        5.10 cm  2.88 cm/m  RA Area:     65.90 cm LA Vol (A2C):   110.0 ml 62.03 ml/m RA Volume:   364.00 ml 205.26 ml/m LA Vol (A4C):   100.0 ml 56.39 ml/m LA Biplane Vol: 115.0 ml 64.85 ml/m  AORTIC VALVE AV Area (Vmax):    1.33 cm AV Area (Vmean):   1.39 cm AV Area (VTI):     1.21 cm AV Vmax:           153.00 cm/s AV Vmean:          101.000 cm/s AV VTI:            0.251 m AV Peak Grad:      9.4 mmHg AV Mean Grad:      5.0 mmHg LVOT Vmax:         64.60 cm/s LVOT Vmean:        44.800 cm/s LVOT VTI:          0.097 m LVOT/AV VTI ratio: 0.39  AORTA Ao Root diam: 3.20 cm Ao Asc diam:  2.30 cm TRICUSPID VALVE TR Peak grad:   19.9 mmHg TR Vmax:        223.00 cm/s  SHUNTS Systemic VTI:  0.10 m Systemic Diam: 2.00 cm Donato Schultz MD Electronically signed by Donato Schultz MD Signature Date/Time: 07/17/2021/12:59:03 PM    Final     Cardiac Studies   Echocardiogram from 07/17/2021:    1. Left ventricular ejection fraction, by estimation, is 55 to 60%. The  left ventricle has  normal function. The left ventricle has no regional  wall motion abnormalities. Left ventricular diastolic parameters are  consistent with Grade I diastolic  dysfunction (impaired relaxation). There is the interventricular septum is  flattened in systole and diastole, consistent with right ventricular  pressure and volume overload.   2. Right ventricular systolic function is mildly reduced. The right  ventricular size is severely enlarged. There is normal pulmonary artery  systolic pressure. The estimated right ventricular systolic pressure is  34.9 mmHg.   3. Left atrial size was severely dilated.   4. Right atrial size was massively dilated.   5. A small pericardial effusion is present. The pericardial effusion is  localized near the right atrium.   6. The mitral valve is normal in structure. Moderate to severe mitral  valve regurgitation. No evidence of mitral  stenosis.   7. Tricuspid valve regurgitation is severe.   8. The aortic valve is normal in structure. Aortic valve regurgitation is  not visualized. No aortic stenosis is present.   9. Pulmonic valve regurgitation is moderate.  10. The inferior vena cava is dilated in size with <50% respiratory  variability, suggesting right atrial pressure of 15 mmHg.   Comparison(s): No significant change from prior study. Prior images  reviewed side by side.     Patient Profile     80 y.o. female with a hx of chronic right heart failure, severe mitral regurgitation, severe tricuspid regurgitation, permanent atrial fibrillation on Coumadin, hypertension, obesity, hx of MCA CVA 2012, who is being seen 07/18/2021 for the evaluation of decompensated heart failure at the request of Dr. Sloan LeiterMasters.   Assessment & Plan    Acute on chronic right heart failure with severe MR/TR - Several weeks of worsening shortness of breath leg edema.  Echo demonstrates RV pressure and volume overload with severe valvular disease. - He was started on IV Lasix 40 mg twice a day.  Lasix at home did not seem to affect her edema.  Weight came down nicely from 1 64-1 58. -She once again reiterated that she does not desire to have any invasive procedures done for her valvular disease.  She has seen our structural heart team in consultation.  She still stands by this. - Continue with best medical management.  Obviously this does not correct the underlying condition.  Agree with palliative care consult to discuss goals of care in the future.  Permanent atrial fibrillation - Rate controlled.  Diltiazem CD1 20 mg was stopped.  Digoxin was started to allow room for blood pressure given her RV failure.  Recommend checking digoxin level on 07/21/2021.  Chronic anticoagulation - High CHA2DS2-VASc score of 7.  On Coumadin.  Pharmacy adjusting.  Transient hypotension - Careful with IV diuresis given her preload dependency with RV failure.   Diltiazem 120 mg CD has been discontinued.    For questions or updates, please contact [facility] Please consult [email or web address] for contact info under        Signed, Donato SchultzMark Saja Bartolini, MD  07/19/2021, 9:20 AM

## 2021-07-20 ENCOUNTER — Encounter (HOSPITAL_COMMUNITY): Payer: Self-pay | Admitting: Student in an Organized Health Care Education/Training Program

## 2021-07-20 DIAGNOSIS — I482 Chronic atrial fibrillation, unspecified: Secondary | ICD-10-CM | POA: Diagnosis not present

## 2021-07-20 DIAGNOSIS — I50813 Acute on chronic right heart failure: Secondary | ICD-10-CM | POA: Diagnosis not present

## 2021-07-20 LAB — BASIC METABOLIC PANEL
Anion gap: 6 (ref 5–15)
BUN: 24 mg/dL — ABNORMAL HIGH (ref 8–23)
CO2: 26 mmol/L (ref 22–32)
Calcium: 8.3 mg/dL — ABNORMAL LOW (ref 8.9–10.3)
Chloride: 102 mmol/L (ref 98–111)
Creatinine, Ser: 0.93 mg/dL (ref 0.44–1.00)
GFR, Estimated: >60 mL/min (ref >60)
Glucose, Bld: 96 mg/dL (ref 70–99)
Potassium: 4.3 mmol/L (ref 3.5–5.1)
Sodium: 134 mmol/L — ABNORMAL LOW (ref 135–145)

## 2021-07-20 LAB — CBC
HCT: 33.7 % — ABNORMAL LOW (ref 36.0–46.0)
Hemoglobin: 11.3 g/dL — ABNORMAL LOW (ref 12.0–15.0)
MCH: 33.1 pg (ref 26.0–34.0)
MCHC: 33.5 g/dL (ref 30.0–36.0)
MCV: 98.8 fL (ref 80.0–100.0)
Platelets: 160 10*3/uL (ref 150–400)
RBC: 3.41 MIL/uL — ABNORMAL LOW (ref 3.87–5.11)
RDW: 14 % (ref 11.5–15.5)
WBC: 5.1 10*3/uL (ref 4.0–10.5)
nRBC: 0 % (ref 0.0–0.2)

## 2021-07-20 LAB — MAGNESIUM: Magnesium: 1.8 mg/dL (ref 1.7–2.4)

## 2021-07-20 LAB — URINE CULTURE
Culture: >=100000 — AB
Special Requests: NORMAL

## 2021-07-20 LAB — PROTIME-INR
INR: 1.9 — ABNORMAL HIGH (ref 0.8–1.2)
Prothrombin Time: 21.8 seconds — ABNORMAL HIGH (ref 11.4–15.2)

## 2021-07-20 MED ORDER — FUROSEMIDE 40 MG PO TABS
40.0000 mg | ORAL_TABLET | Freq: Every day | ORAL | Status: DC
  Administered 2021-07-21 – 2021-07-25 (×5): 40 mg via ORAL
  Filled 2021-07-20 (×5): qty 1

## 2021-07-20 MED ORDER — WARFARIN SODIUM 7.5 MG PO TABS
7.5000 mg | ORAL_TABLET | Freq: Once | ORAL | Status: AC
  Administered 2021-07-20: 7.5 mg via ORAL
  Filled 2021-07-20: qty 1

## 2021-07-20 MED ORDER — MAGNESIUM SULFATE 2 GM/50ML IV SOLN
2.0000 g | Freq: Once | INTRAVENOUS | Status: AC
  Administered 2021-07-20: 2 g via INTRAVENOUS
  Filled 2021-07-20: qty 50

## 2021-07-20 NOTE — Progress Notes (Signed)
  Mobility Specialist Criteria Algorithm Info.  Mobility Team: [facility] elevated:Self regulated Activity: Ambulated in room; Transferred:  Bed to chair Range of motion: Active; All extremities Level of assistance: Moderate assist, patient does 50-74% Assistive device: None (HHA) Minutes sitting in chair:  Minutes stood: 1 minutes Minutes ambulated: 1 minutes Distance ambulated (ft): 5 ft Mobility response: Tolerated well Bed Position: Chair  Patient agreed to participate in mobility. Requested to sit in recliner but declined further ambulation. Required Mod A to EOB supine>sit and Mod A sit>stand + cues for hand placement and sequencing. Ambulated 5 feet with min A to chair. Tolerated ambulation well without complaint or incident.   07/20/2021 4:46 PM

## 2021-07-20 NOTE — Progress Notes (Addendum)
Progress Note  Patient Name: Joanna [name] Date of Encounter: 07/20/2021  Primary Cardiologist:  Donato Schultz, MD   Subjective   No specific complaints today.   Inpatient Medications    Scheduled Meds:  digoxin  0.125 mg Oral Daily   furosemide  40 mg Intravenous BID   metoprolol succinate  50 mg Oral QPM   polyethylene glycol  17 g Oral Daily   potassium chloride  40 mEq Oral BID   sodium chloride flush  3 mL Intravenous Once   Warfarin - Pharmacist Dosing Inpatient   Does not apply q1600   Continuous Infusions:  PRN Meds: acetaminophen **OR** acetaminophen, ondansetron (ZOFRAN) IV, senna-docusate   Vital Signs    Vitals:   07/19/21 0843 07/19/21 1124 07/19/21 2049 07/20/21 0611  BP: 112/72 (!) 133/91 (!) 109/59 119/84  Pulse:  (!) 107 (!) 104 (!) 102  Resp: 16 15 (!) 22 20  Temp:  98.8 F (37.1 C) 98.6 F (37 C) 98.5 F (36.9 C)  TempSrc:  Oral Oral Oral  SpO2: 95% 99% 100% 99%  Weight:    73.9 kg  Height:        Intake/Output Summary (Last 24 hours) at 07/20/2021 1013 Last data filed at 07/20/2021 0500 Gross per 24 hour  Intake 240 ml  Output 1925 ml  Net -1685 ml   Filed Weights   07/18/21 0436 07/19/21 0530 07/20/21 0611  Weight: 71.3 kg 72 kg 73.9 kg    Physical Exam   General: Well developed, well nourished, NAD Neck: Negative for carotid bruits. No JVD Lungs:Clear to ausculation bilaterally. Breathing is unlabored. Cardiovascular: Irregularly irregular. No murmurs Extremities: 1+BLE edema. Radial pulses 2+ bilaterally Neuro: Alert and oriented. No focal deficits. No facial asymmetry. MAE spontaneously. Psych: Responds to questions appropriately with normal affect.    Labs    Chemistry Recent Labs  Lab 07/18/21 0244 07/19/21 0143 07/20/21 0328  NA 135 135 134*  K 4.0 3.7 4.3  CL 100 99 102  CO2 26 26 26   GLUCOSE 100* 115* 96  BUN 20 23 24*  CREATININE 1.19* 1.16* 0.93  CALCIUM 8.6* 8.7* 8.3*  GFRNONAA 46* 48* >60  ANIONGAP  9 10 6      Hematology Recent Labs  Lab 07/18/21 0244 07/19/21 0143 07/20/21 0328  WBC 8.0 6.6 5.1  RBC 3.35* 3.38* 3.41*  HGB 10.9* 10.9* 11.3*  HCT 34.1* 34.2* 33.7*  MCV 101.8* 101.2* 98.8  MCH 32.5 32.2 33.1  MCHC 32.0 31.9 33.5  RDW 14.4 14.2 14.0  PLT 126* 151 160    Cardiac EnzymesNo results for input(s): TROPONINI in the last 168 hours. No results for input(s): TROPIPOC in the last 168 hours.   BNP Recent Labs  Lab 08/06/2021 1623  BNP 1,307.3*     DDimer No results for input(s): DDIMER in the last 168 hours.   Radiology    No results found.  Telemetry    07/20/21 AF with stable rates, some spikes to the 140's felt to be with movement/ambulation Personally Reviewed  ECG     EKG ordered for today however not yet obtained- Personally Reviewed  Cardiac Studies   Echocardiogram from 07/17/2021:    1. Left ventricular ejection fraction, by estimation, is 55 to 60%. The  left ventricle has normal function. The left ventricle has no regional  wall motion abnormalities. Left ventricular diastolic parameters are  consistent with Grade I diastolic  dysfunction (impaired relaxation). There is the interventricular septum is  flattened in systole and diastole, consistent with right ventricular  pressure and volume overload.   2. Right ventricular systolic function is mildly reduced. The right  ventricular size is severely enlarged. There is normal pulmonary artery  systolic pressure. The estimated right ventricular systolic pressure is  34.9 mmHg.   3. Left atrial size was severely dilated.   4. Right atrial size was massively dilated.   5. A small pericardial effusion is present. The pericardial effusion is  localized near the right atrium.   6. The mitral valve is normal in structure. Moderate to severe mitral  valve regurgitation. No evidence of mitral stenosis.   7. Tricuspid valve regurgitation is severe.   8. The aortic valve is normal in structure. Aortic  valve regurgitation is  not visualized. No aortic stenosis is present.   9. Pulmonic valve regurgitation is moderate.  10. The inferior vena cava is dilated in size with <50% respiratory  variability, suggesting right atrial pressure of 15 mmHg.   Comparison(s): No significant change from prior study. Prior images  reviewed side by side.   Patient Profile     80 y.o. female with a hx of chronic right heart failure, severe mitral regurgitation, severe tricuspid regurgitation, permanent atrial fibrillation on Coumadin, hypertension, obesity, hx of MCA CVA 2012, who is being seen 07/18/2021 for the evaluation of decompensated heart failure at the request of [name].   Assessment & Plan    1. Acute on chronic RV failure with severe MR/TR: -Presented with worsening SOB and BLE with echocardiogram showing elevated RV pressures and volume overload with severe valvular disease. She has been been evaluated by structural heart team with no plans for Mitraclip intervention. She has been diuresed with IV Lasix with improvement  -Continue with medical management at this time -Will transition IV Lasix to PO today  -Consider palliative consult for goals of care discussion  2.  Permanent atrial fibrillation: -Rates remain controlled -On PTA diltiazem which was stopped and digoxin started to allow for BP room given RV failure -Dig level planned for tomorrow 07/21/21 -Anticoagulated with Coumadin  -Pharmacy following and adjusting  -INR, 1.9  3. Hypotension: -Stable, 119/84>>109/59>>>133/91 -Attempting to avoid hypotension due to preload dependent RV failure -Continue current regimen    Signed, Georgie Chard NP-C [facility] Pager: [phone] 07/20/2021, 10:13 AM     For questions or updates, please contact   Please consult [email or web address] for contact info under Cardiology/STEMI.  Personally seen and examined. Agree with above.  79 year old female with acute on chronic RV failure/severe  MR/severe TR. - Overall feeling better after diuresis.  Remember she is preload dependent given her RV failure.  I think it is reasonable to switch her over to p.o. Lasix. - Checking dig level tomorrow. - INR 1.9, rate controlled atrial fibrillation. -Systolic murmur appreciated on exam. -Not a candidate for invasive therapies at this time.  Previously evaluated by structural heart team, she did not wish to proceed with any invasive therapies.  Donato Schultz, MD

## 2021-07-20 NOTE — Progress Notes (Signed)
ANTICOAGULATION CONSULT NOTE - Follow Up Consult  Pharmacy Consult for PTA warfarin Indication: atrial fibrillation  No Known Allergies  Patient Measurements: Height: 5\' 4"  (162.6 cm) Weight: 73.9 kg (162 lb 14.7 oz) IBW/kg (Calculated) : 54.7  Vital Signs: Temp: 98.4 F (36.9 C) (09/06 1116) Temp Source: Oral (09/06 1116) BP: 122/75 (09/06 1116) Pulse Rate: 105 (09/06 1116)  Labs: Recent Labs    07/18/21 0244 07/19/21 0143 07/20/21 0328  HGB 10.9* 10.9* 11.3*  HCT 34.1* 34.2* 33.7*  PLT 126* 151 160  LABPROT 37.1* 26.4* 21.8*  INR 3.8* 2.4* 1.9*  CREATININE 1.19* 1.16* 0.93     Estimated Creatinine Clearance: 47.5 mL/min (by C-G formula based on SCr of 0.93 mg/dL).   Medications:  Scheduled:   digoxin  0.125 mg Oral Daily   furosemide  40 mg Oral Daily   metoprolol succinate  50 mg Oral QPM   polyethylene glycol  17 g Oral Daily   potassium chloride  40 mEq Oral BID   sodium chloride flush  3 mL Intravenous Once   Warfarin - Pharmacist Dosing Inpatient   Does not apply q1600    Assessment: 80 yo F with SOB, CXR with mild pulm edema and small R pleural effusion. PMHx of CAD, CVA (memory deficits), Afib (PTA warfarin), and HTN.   PTA warfarin regimen: 7.5mg  daily MWF, 5 mg all other days. PTA LD 9/1.  INR 9/6: 1.9, subtherapeutic  No s/s of bleeding noted. H/H stable.   Goal of Therapy:  INR 2-3 Monitor platelets by anticoagulation protocol: Yes   Plan:  Give warfarin 7.5 mg x1 Daily INR, CBC Monitor for s/sx of bleeding F/u palliative care goals  Thank you for allowing pharmacy to participate in this patient's care.  11/1, PharmD PGY1 Acute Care Resident  07/20/2021,12:16 PM

## 2021-07-20 NOTE — Progress Notes (Signed)
Heart Failure Nurse Navigator Progress Note  Attempted to interview for HV TOC readiness. Pt unable to answer many questions, request to complete interview when daughter is present. Pt stated her daughter lives with her but she is able to complete ADLs independently. Pt also stated she needs to have a BM. NT able to assist.   Ozella Rocks, MSN, RN Heart Failure Nurse Navigator [phone]

## 2021-07-20 NOTE — TOC Progression Note (Signed)
Transition of Care [facility]) - Progression Note    Patient Details  Name: Joanna [name] MRN: [id number] Date of Birth: [birth year]  Transition of [facility]) CM/SW Contact  Terrial Rhodes, LCSWA Phone Number: 07/20/2021, 4:46 PM  Clinical Narrative:     CSW spoke with patient and patients daughter [name] at bedside regarding PT recommendation of SNF placement at time of discharge.  Patient expressed understanding of PT recommendation and is agreeable to SNF placement at time of discharge. Patient comes from home with daughter [name]. Patient and patients daughter gave permission for CSW to fax out initial referral near the [place] area. First choice would be [facility].Patient has received the COVID vaccines as well as booster. No further questions reported at this time. CSW to continue to follow and assist with discharge planning needs.   Expected Discharge Plan: Skilled Nursing Facility Barriers to Discharge: Continued Medical Work up  Expected Discharge Plan and Services Expected Discharge Plan: Skilled Nursing Facility In-house Referral: Clinical Social Work     Living arrangements for the past 2 months: Single Family Home                                       Social Determinants of Health (SDOH) Interventions    Readmission Risk Interventions No flowsheet data found.

## 2021-07-20 NOTE — Progress Notes (Signed)
HD#3 SUBJECTIVE:  Patient Summary: Joanna [name] is a 80 y.o. with a pertinent PMH of valvular atrial fibrillatin, chronic right heart failure with mitral and tricuspid regurgitation, prior CVA's, who presented with increasing shortness of breath and leg swelling and admitted for acute on chronic heart failure.   Overnight Events: Patient states that she rested well overnight and is hungry for breakfast.  OBJECTIVE:  Vital Signs: Vitals:   07/19/21 0530 07/19/21 0843 07/19/21 1124 07/19/21 2049  BP: 119/87 112/72 (!) 133/91 (!) 109/59  Pulse: (!) 109  (!) 107 (!) 104  Resp: 18 16 15  (!) 22  Temp: 98.2 F (36.8 C)  98.8 F (37.1 C) 98.6 F (37 C)  TempSrc: Oral  Oral Oral  SpO2: 96% 95% 99% 100%  Weight: 72 kg     Height:       Supplemental O2: Nasal Cannula SpO2: 100 % O2 Flow Rate (L/min): 2 L/min  Filed Weights   07/17/21 0407 07/18/21 0436 07/19/21 0530  Weight: 72 kg 71.3 kg 72 kg     Intake/Output Summary (Last 24 hours) at 07/20/2021 0546 Last data filed at 07/19/2021 1837 Gross per 24 hour  Intake 300 ml  Output 1325 ml  Net -1025 ml   Net IO Since Admission: -2,385 mL [07/20/21 0546]  Physical Exam: General: Well-developed, well-nourished, in no acute distress HEENT: NCAT, conjunctiva clear CV: 3/6 Holosystolic murmur, no rubs, or gallops, Irregularly irregular mildly tachycardic Pulmonary: CTAB, normal pulmonary effort GI: no tenderness, bowel sounds present MSK: trace pitting edema in legs bilaterally with wrinkling, chronic venous stasis changes Psych: normal mood and affect    Patient Lines/Drains/Airways Status     Active Line/Drains/Airways     Name Placement date Placement time Site Days   Peripheral IV 07/21/2021 20 G Left Antecubital 07/22/2021  1556  Antecubital  4   External Urinary Catheter 07/19/21  0950  --  1            Pertinent Labs: CBC Latest Ref Rng & Units 07/20/2021 07/19/2021 07/18/2021  WBC 4.0 - 10.5 K/uL 5.1 6.6 8.0   Hemoglobin 12.0 - 15.0 g/dL 11.3(L) 10.9(L) 10.9(L)  Hematocrit 36.0 - 46.0 % 33.7(L) 34.2(L) 34.1(L)  Platelets 150 - 400 K/uL 160 151 126(L)    CMP Latest Ref Rng & Units 07/20/2021 07/19/2021 07/18/2021  Glucose 70 - 99 mg/dL 96 09/17/2021) 540(G)  BUN 8 - 23 mg/dL 867(Y) 23 20  Creatinine 0.44 - 1.00 mg/dL 19(J 0.93) 2.67(T)  Sodium 135 - 145 mmol/L 134(L) 135 135  Potassium 3.5 - 5.1 mmol/L 4.3 3.7 4.0  Chloride 98 - 111 mmol/L 102 99 100  CO2 22 - 32 mmol/L 26 26 26   Calcium 8.9 - 10.3 mg/dL 8.3(L) 8.7(L) 8.6(L)  Total Protein 6.0 - 8.5 g/dL - - -  Total Bilirubin 0.0 - 1.2 mg/dL - - -  Alkaline Phos 39 - 117 IU/L - - -  AST 0 - 40 IU/L - - -  ALT 0 - 32 IU/L - - -    No results for input(s): GLUCAP in the last 72 hours.   Pertinent Imaging: DG Chest Port 1 View   Result Date: 07/18/2021 CLINICAL DATA:  Chest pain and shortness of breath EXAM: PORTABLE CHEST 1 VIEW COMPARISON:  Radiograph 07/23/2021, chest CT 10/07/2018 FINDINGS: Unchanged, markedly enlarged cardiac silhouette. Unchanged mild pulmonary edema. Decreased small right pleural effusion. No visible pneumothorax. Bones are unchanged. IMPRESSION: Unchanged marked cardiomegaly with pulmonary vascular congestion/mild edema. Decreased  small right pleural effusion. Electronically Signed   By: Caprice Renshaw M.D.   On: 07/18/2021 11:06      ASSESSMENT/PLAN:  Assessment: Principal Problem:   Acute on chronic right heart failure (HCC) Active Problems:   Chronic atrial fibrillation   Mitral valve regurgitation   Plan: #Acute on Chronic HF exacerbation with preserved EF #Severe mitral regurgitation and tricuspid regurgitation Patient presented with a history of two weeks of increasing SOB and leg swelling in the setting of known severe mitral and tricuspid regurgitation that she does not wish to have further interventions/procedures on.  On 9/4, her blood pressure was trending down with lowest MAP of 68. Patient remains volume  overloaded with dry weight of 64 kg.  Currently at 72-->73.9kg for hospital admission with urine output of -1625 mL yesterday. She is pre-load dependent making diuresis difficult.  Cardiology consulted and recommended stopping home medication of Diltazem 120 mg and changing it to digoxin.   At this point, patient is likely past the point that surgical interventions would be beneficial due to severe RV dysfunction.  Discussed goals of care with patient and her daughter, Joanna [name], and son, Joanna [name].  Patient states that she would like to remain at home.  PT evaluated patient and their recommendations are for short-term rehab.  At this point patient does not seem to want to go to rehab or to have help in the home such as home health due to concerns of losing her independence.  Consulted to palliative to help facilitate longer conversation with patient about her next steps. -IV Lasix 40 mg twice daily -Echo showed preserved EF with severe mitral and tricuspid regurgitation as seen on previous Echo -home medications: Discontinued Cardizem 120 mg daily and continued metoprolol succinate 50 mg daily -started digoxin -daily BMP, K>4, MG>2 -Strict I/Os -PT/OT -digoxin levels -palliative consult- Work on clarifying goals of care   #Valvular atrial fibrillation on coumadin Since initiating home metoprolol, patient's heart rate has improved to 80-90's. TSH normal. INR 3.9 on arrival, goal 2-3. Appreciate pharmacy's assistance with warfarin dosing.  Today INR at 1.9. - Warfarin per pharmacy, has been held three days - Continue metoprolol succinate 50mg  daily - discontinue Cardizem 120mg  daily - start digoxin and will check levels on 09/07   #Macrocytic anemia#thrombocytopenia-resolved Hbg downtrending fom 11.2-->11,1->10.9-->11.3, Platelet count of 160K.  Folate wnl, Vitamin B-12 wnl -Daily CBC   Best Practice: Diet: Cardiac diet IVF: Fluids: none VTE: warfarin Code: Full AB: none Family Contact: Joanna [name]  and , her daughter and son DISPO: Anticipated discharge in 3-5 days to Home pending Medical stability.  Signature: 11/07, D.O. Internal Medicine Resident, PGY-1 [name]  Pager: [phone] 5:46 AM, 07/20/2021   Please contact the on call pager after 5 pm and on weekends at [phone].

## 2021-07-20 NOTE — NC FL2 (Signed)
[place] MEDICAID FL2 LEVEL OF CARE SCREENING TOOL     IDENTIFICATION  Patient Name: Joanna [name] Birthdate: Nov 25, 1940 Sex: female Admission Date (Current Location): 07/29/2021  [facility] and [place] Number:  Producer, television/film/video and Address:  [facility], [address] [place], [place] [address]      Provider Number: [id number]  Attending Physician Name and Address:  Tyson Alias, *  Relative Name and Phone Number:  [name] [phone]    Current Level of Care: Hospital Recommended Level of Care: Skilled Nursing Facility Prior Approval Number:    Date Approved/Denied:   PASRR Number: [id number]  Discharge Plan: SNF    Current Diagnoses: Patient Active Problem List   Diagnosis Date Noted   Mitral valve regurgitation 07/17/2021   Acute on chronic right heart failure (HCC) 07/29/2021   Chronic kidney disease    Hypertension    Chronic atrial fibrillation 10/26/2015   Encounter for therapeutic drug monitoring 01/03/2014   Left ventricular dysfunction    Atrial fibrillation (HCC) 02/04/2011    Orientation RESPIRATION BLADDER Height & Weight     Self, Time, Situation, Place  O2 (Nasal Cannula 2 liters) Incontinent, External catheter (External Urinary Catheter) Weight: 162 lb 14.7 oz (73.9 kg) Height:  5\' 4"  (162.6 cm)  BEHAVIORAL SYMPTOMS/MOOD NEUROLOGICAL BOWEL NUTRITION STATUS      Incontinent Diet (Please see discharge summary)  AMBULATORY STATUS COMMUNICATION OF NEEDS Skin   Limited Assist Verbally Other (Comment) (cracking heel bilateral)                       Personal Care Assistance Level of Assistance  Bathing, Feeding, Dressing Bathing Assistance: Limited assistance Feeding assistance: Independent Dressing Assistance: Limited assistance     Functional Limitations Info  Sight, Hearing, Speech   Hearing Info: Adequate Speech Info: Adequate    SPECIAL CARE FACTORS FREQUENCY  PT (By licensed PT), OT (By  licensed OT)     PT Frequency: 5x min weekly OT Frequency: 5x min weekly            Contractures Contractures Info: Not present    Additional Factors Info  Code Status, Allergies Code Status Info: FULL Allergies Info: No known allergies           Current Medications (07/20/2021):  This is the current hospital active medication list Current Facility-Administered Medications  Medication Dose Route Frequency Provider Last Rate Last Admin   acetaminophen (TYLENOL) tablet 650 mg  650 mg Oral Q6H PRN Katsadouros, Vasilios, MD   650 mg at 07/18/21 09/17/21   Or   acetaminophen (TYLENOL) suppository 650 mg  650 mg Rectal Q6H PRN Katsadouros, Vasilios, MD       digoxin (LANOXIN) tablet 0.125 mg  0.125 mg Oral Daily Croitoru, Mihai, MD   0.125 mg at 07/20/21 0831   furosemide (LASIX) tablet 40 mg  40 mg Oral Daily 09/19/21 D, NP       metoprolol succinate (TOPROL-XL) 24 hr tablet 50 mg  50 mg Oral QPM Katsadouros, Vasilios, MD   50 mg at 07/20/21 1705   ondansetron (ZOFRAN) injection 4 mg  4 mg Intravenous Daily PRN Masters, Katie, DO       polyethylene glycol (MIRALAX / GLYCOLAX) packet 17 g  17 g Oral Daily Masters, Katie, DO   17 g at 07/18/21 1822   potassium chloride SA (KLOR-CON) CR tablet 40 mEq  40 mEq Oral BID Masters, Katie, DO   40  mEq at 07/20/21 0831   senna-docusate (Senokot-S) tablet 1 tablet  1 tablet Oral QHS PRN Evlyn Kanner, MD       sodium chloride flush (NS) 0.9 % injection 3 mL  3 mL Intravenous Once Pricilla Loveless, MD       Warfarin - Pharmacist Dosing Inpatient   Does not apply [id number] Belva Agee, MD   Given at 07/19/21 1720     Discharge Medications: Please see discharge summary for a list of discharge medications.  Relevant Imaging Results:  Relevant Lab Results:   Additional Information [id number]  Terrial Rhodes, LCSWA

## 2021-07-20 NOTE — Progress Notes (Signed)
Patient's heart rate is 110's to 140's.  EKG obtained. MD is aware

## 2021-07-20 NOTE — Progress Notes (Signed)
Palliative progress note-   Chart reviewed. Consult received for: ""Right Heart Failure with acute exacerbationRight Heart Failure with acute exacerbation.  Difficulty with diuresis due to pre-load dependence.  Patient states she does not want further interventions."  Called patient's daughterKathie Rhodes to schedule meeting.   Planned meeting for tomorrow 9/7 and 11am.   Ocie Bob, AGNP-C Palliative Medicine  Please call Palliative Medicine team phone with any questions [phone]. For individual providers please see AMION.  No charge

## 2021-07-21 DIAGNOSIS — I340 Nonrheumatic mitral (valve) insufficiency: Secondary | ICD-10-CM | POA: Diagnosis not present

## 2021-07-21 DIAGNOSIS — I50813 Acute on chronic right heart failure: Secondary | ICD-10-CM | POA: Diagnosis not present

## 2021-07-21 DIAGNOSIS — I509 Heart failure, unspecified: Secondary | ICD-10-CM | POA: Diagnosis not present

## 2021-07-21 DIAGNOSIS — Z515 Encounter for palliative care: Secondary | ICD-10-CM

## 2021-07-21 DIAGNOSIS — Z7189 Other specified counseling: Secondary | ICD-10-CM

## 2021-07-21 LAB — PROTIME-INR
INR: 1.9 — ABNORMAL HIGH (ref 0.8–1.2)
Prothrombin Time: 22.1 seconds — ABNORMAL HIGH (ref 11.4–15.2)

## 2021-07-21 LAB — BASIC METABOLIC PANEL
Anion gap: 5 (ref 5–15)
BUN: 23 mg/dL (ref 8–23)
CO2: 29 mmol/L (ref 22–32)
Calcium: 8.3 mg/dL — ABNORMAL LOW (ref 8.9–10.3)
Chloride: 99 mmol/L (ref 98–111)
Creatinine, Ser: 0.84 mg/dL (ref 0.44–1.00)
GFR, Estimated: >60 mL/min (ref >60)
Glucose, Bld: 107 mg/dL — ABNORMAL HIGH (ref 70–99)
Potassium: 4.9 mmol/L (ref 3.5–5.1)
Sodium: 133 mmol/L — ABNORMAL LOW (ref 135–145)

## 2021-07-21 LAB — CBC
HCT: 33.7 % — ABNORMAL LOW (ref 36.0–46.0)
Hemoglobin: 10.8 g/dL — ABNORMAL LOW (ref 12.0–15.0)
MCH: 32.3 pg (ref 26.0–34.0)
MCHC: 32 g/dL (ref 30.0–36.0)
MCV: 100.9 fL — ABNORMAL HIGH (ref 80.0–100.0)
Platelets: 178 10*3/uL (ref 150–400)
RBC: 3.34 MIL/uL — ABNORMAL LOW (ref 3.87–5.11)
RDW: 14 % (ref 11.5–15.5)
WBC: 4.8 10*3/uL (ref 4.0–10.5)
nRBC: 0 % (ref 0.0–0.2)

## 2021-07-21 LAB — DIGOXIN LEVEL: Digoxin Level: 0.4 ng/mL — ABNORMAL LOW (ref 0.8–2.0)

## 2021-07-21 MED ORDER — WARFARIN SODIUM 3 MG PO TABS
9.0000 mg | ORAL_TABLET | Freq: Once | ORAL | Status: AC
  Administered 2021-07-21: 9 mg via ORAL
  Filled 2021-07-21: qty 3

## 2021-07-21 MED ORDER — MAGNESIUM CHLORIDE 64 MG PO TBEC
1.0000 | DELAYED_RELEASE_TABLET | Freq: Once | ORAL | Status: AC
  Administered 2021-07-21: 64 mg via ORAL
  Filled 2021-07-21: qty 1

## 2021-07-21 MED ORDER — METOPROLOL SUCCINATE ER 100 MG PO TB24
100.0000 mg | ORAL_TABLET | Freq: Every evening | ORAL | Status: DC
  Administered 2021-07-21 – 2021-07-24 (×4): 100 mg via ORAL
  Filled 2021-07-21 (×4): qty 1

## 2021-07-21 NOTE — Progress Notes (Signed)
Daughter would like to speak with nurse case manager about discharge. She states that she needs "things" in place for the patient to go home. Family states they would like to go home and not rehab.

## 2021-07-21 NOTE — Care Management Important Message (Signed)
Important Message  Patient Details  Name: Joanna [name] MRN: [id number] Date of Birth: 1941-03-11   Medicare Important Message Given:  Yes     Renie Ora 07/21/2021, 9:31 AM

## 2021-07-21 NOTE — Progress Notes (Signed)
ANTICOAGULATION CONSULT NOTE  Pharmacy Consult:  Coumadin Indication: atrial fibrillation  No Known Allergies  Patient Measurements: Height: 5\' 4"  (162.6 cm) Weight: 71.7 kg (158 lb 1.1 oz) IBW/kg (Calculated) : 54.7  Vital Signs: Temp: 97.9 F (36.6 C) (09/07 0653) Temp Source: Oral (09/07 0653) BP: 132/88 (09/07 0653) Pulse Rate: 104 (09/07 0653)  Labs: Recent Labs    07/19/21 0143 07/20/21 0328 07/21/21 0138  HGB 10.9* 11.3* 10.8*  HCT 34.2* 33.7* 33.7*  PLT 151 160 178  LABPROT 26.4* 21.8* 22.1*  INR 2.4* 1.9* 1.9*  CREATININE 1.16* 0.93 0.84     Estimated Creatinine Clearance: 51.9 mL/min (by C-G formula based on SCr of 0.84 mg/dL).  Assessment: 80 yo F with SOB, CXR with mild pulm edema and small R pleural effusion.  Pharmacy consulted to continue Coumadin from PTA for history of Afib.  INR slightly sub-therapeutic today at 1.9; no bleeding reported.   PTA regimen: 7.5mg  daily MWF, 5 mg all other days. INR 3.9 on admit.  Goal of Therapy:  INR 2-3 Monitor platelets by anticoagulation protocol: Yes   Plan:  Coumadin 9mg  PO today Daily PT / INR F/u Go[facility]  Foye Haggart D. 80, PharmD, BCPS, BCCCP 07/21/2021, 10:38 AM

## 2021-07-21 NOTE — Progress Notes (Signed)
Physical Therapy Treatment Patient Details Name: Joanna [name] MRN: [id number] DOB: 22-Jul-1941 Today's Date: 07/21/2021    History of Present Illness Pt is an 80 y.o. female who presented 07-23-21 with increased SOB. Pt admitted with volume overload, acute on chronic right heart failure complicated by mitral and tricuspid regurgitation, as well as atrial fibrillation. She has not been taking Lasix the past few months with noted subsequent lower extremity edema. PMH of HFpEF, severe mitral and tricuspid regurgitation, atrial fibrillation on coumadin, HTN, and prior CVAs    PT Comments    Pt with significant decline since last visit.  Pt was asleep at arrival -question if this related to decline in performance but nurse tech present and reports pt also very difficult to move yesterday.  She required mod-max A for transfers and only shuffled a few steps to chair and then put back in bed due to difficult transfer and pt lethargic.  Pt also seemed to have increased confusion with increased time to respond compared to treatment on Monday.  Did note pt with palliative consult and changes in medications, but did not note anything that would cause this decline in performance. Continue to advance as able.     Follow Up Recommendations  SNF;Supervision/Assistance - 24 hour     Equipment Recommendations  3in1 (PT)    Recommendations for Other Services       Precautions / Restrictions Precautions Precautions: Fall Precaution Comments: monitor SpO2    Mobility  Bed Mobility Overal bed mobility: Needs Assistance Bed Mobility: Rolling Rolling: Mod assist   Supine to sit: Mod assist Sit to supine: Max assist   General bed mobility comments: increased time and cues - assist for trunk and legs    Transfers Overall transfer level: Needs assistance Equipment used: Rolling walker (2 wheeled) Transfers: Sit to/from Stand Sit to Stand: Mod assist         General transfer comment: Performed  x 3 with heavy mod A to rise.  Pt wanting to stand straight up requiring cues for hand placement and visual cues to lean forward - had to have pt place her head on therapist hand to get her far forward enough  Ambulation/Gait Ambulation/Gait assistance: Mod assist Gait Distance (Feet): 3 Feet (3'then 1') Assistive device: Rolling walker (2 wheeled) Gait Pattern/deviations: Step-to pattern;Decreased stride length;Trunk flexed;Shuffle     General Gait Details: Pt walking 3' to chair very slowly, mod A, assist to weight shift, tactile cues to move feet.  On way back to bed slid chair close to bed and pt only taking 1-2 steps then requiring assist to pivot.  Pt freezing when standing and unable to shuffle feet   Stairs             Wheelchair Mobility    Modified Rankin (Stroke Patients Only)       Balance Overall balance assessment: Needs assistance Sitting-balance support: Feet supported;Bilateral upper extremity supported Sitting balance-Leahy Scale: Poor Sitting balance - Comments: Pt with posterior lean requirng assist and reapeated cues to lean forward.  Had her reach for chair armrest to get her to lean forward   Standing balance support: Bilateral upper extremity supported Standing balance-Leahy Scale: Poor Standing balance comment: Requiring RW and min-mod A with posterior lean                            Cognition Arousal/Alertness: Awake/alert Behavior During Therapy: Flat affect Overall Cognitive Status: Impaired/Different from baseline  Area of Impairment: Attention;Memory;Following commands;Safety/judgement;Awareness;Problem solving                   Current Attention Level: Sustained Memory: Decreased short-term memory Following Commands: Follows one step commands with increased time;Follows one step commands inconsistently Safety/Judgement: Decreased awareness of safety;Decreased awareness of deficits Awareness: Emergent Problem Solving:  Slow processing;Decreased initiation;Difficulty sequencing;Requires verbal cues;Requires tactile cues General Comments: Repeated cues for sequencing.  Pt not speaking much initially and throughout transfers , once back in bed noted pt needing to spit. She spit and adjusted her teeth and then began communicating - decreased cognition to notify therapist that she needed to spit during entire treatment.      Exercises      General Comments General comments (skin integrity, edema, etc.): VSS on 2 L O2      Pertinent Vitals/Pain Pain Assessment: No/denies pain    Home Living                      Prior Function            PT Goals (current goals can now be found in the care plan section) Progress towards PT goals: Not progressing toward goals - comment (not moving as well today)    Frequency    Min 2X/week      PT Plan Current plan remains appropriate    Co-evaluation              AM-PAC PT "6 Clicks" Mobility   Outcome Measure  Help needed turning from your back to your side while in a flat bed without using bedrails?: A Lot Help needed moving from lying on your back to sitting on the side of a flat bed without using bedrails?: A Lot Help needed moving to and from a bed to a chair (including a wheelchair)?: A Lot Help needed standing up from a chair using your arms (e.g., wheelchair or bedside chair)?: A Lot Help needed to walk in hospital room?: Total Help needed climbing 3-5 steps with a railing? : Total 6 Click Score: 10    End of Session Equipment Utilized During Treatment: Gait belt;Oxygen Activity Tolerance: Patient tolerated treatment well Patient left: with call bell/phone within reach;in bed;with bed alarm set Nurse Communication: Mobility status PT Visit Diagnosis: Unsteadiness on feet (R26.81);Other abnormalities of gait and mobility (R26.89);Muscle weakness (generalized) (M62.81);Difficulty in walking, not elsewhere classified (R26.2)      Time: 7353-2992 PT Time Calculation (min) (ACUTE ONLY): 29 min  Charges:  $Gait Training: 8-22 mins $Therapeutic Activity: 8-22 mins                    Anise Salvo, PT Acute Rehab Services Pager [phone] [facility] [phone]    Rayetta Humphrey 07/21/2021, 4:01 PM

## 2021-07-21 NOTE — Progress Notes (Signed)
Heart Failure Navigator Progress Note  Assessed for Heart & Vascular TOC clinic readiness.  Patient does not meet criteria due to pt declined.   Navigator available for reassessment of patient.   Ozella Rocks, MSN, RN Heart Failure Nurse Navigator [phone]

## 2021-07-21 NOTE — Consult Note (Addendum)
Consultation Note Date: 07/21/2021   Patient Name: Joanna [name]  DOB: [birth year]  MRN: [id number]  Age / Sex: 80 y.o., female  PCP: Buzzy Han, MD Referring Physician: Axel Filler, *  Reason for Consultation:  GOC  HPI/Patient Profile: 80 y.o. female  with past medical history of  structural heart disease, valvular atrial fibrillation, mitral regurgitation, tricuspid regurgitation, right heart dysfunction, and ischemic vascular disease with prior cerebral infarctions, permanent a-fib admitted on 07/29/2021 with SOB, chest pain and leg swelling in the setting of not taking her lasix at home. She is admitted and being treated for heart failure exacerbation with severe mitral and severe tricuspid regurgitation.  She in not a candidate nor does she choose to have further invasive tx for her heart failure. Palliative medicine consulted for  "Right Heart Failure with acute exacerbationRight Heart Failure with acute exacerbation.  Difficulty with diuresis due to pre-load dependence.  Patient states she does not want further interventions." This is her first hospitalization for this problem since 2019.   Primary Decision Maker NEXT OF KIN- daughter and son  Discussion: I have reviewed medical records including EPIC notes, labs and imaging, received report from RN, assessed the patient and then met at the bedside along with patient's daughter and son to discuss diagnosis prognosis, [place], EOL wishes, disposition and options.  I introduced Palliative Medicine as specialized medical care for people living with serious illness. It focuses on providing relief from the symptoms and stress of a serious illness. The goal is to improve quality of life for both the patient and the family.   We discussed a brief life review of the patient and then focused on their current illness. She is retired from working  for [employer]. She enjoys watching the [employer].  When I asked Christinamarie about her heart condition and what the doctors had discussed with there she was unable to elicit any information- her daughter conversed from there about her illness and goals of care.  The natural disease trajectory was discussed.  Advanced directives, concepts specific to code status, and rehospitalization were considered and discussed..  Patient's daughter shares that she believes patient should not be on life support and not undergo CPR as this would not reverse her overall illness and would lead to more suffering.   Hospice and Palliative Care services outpatient were explained and offered.  Discussed the importance of continued conversation with family and the medical providers regarding overall plan of care and treatment options, ensuring decisions are within the context of the patient's values and GOCs.    Questions and concerns were addressed.  Hard Choices booklet left for review. The family was encouraged to call with questions or concerns.    SUMMARY OF RECOMMENDATIONS - Family is considering option of Palliative vs Hospice at home- they are going to meet tonight to discuss further with each other -Family is considering change in code status- they are also going to discuss this tonight -PMT will followup regarding decisions    Code Status/Advance Care  Planning: Full code   Prognosis:   Unable to determine  Discharge Planning: To Be Determined  Primary Diagnoses: Present on Admission:  Acute on chronic right heart failure (HCC)  Chronic atrial fibrillation  Mitral valve regurgitation   Review of Systems  Physical Exam Vitals and nursing note reviewed.  Cardiovascular:     Rate and Rhythm: Tachycardia present.  Pulmonary:     Comments: Increased effort at rest Neurological:     Mental Status: She is alert.    Vital Signs: BP 132/88 (BP Location: Left Arm)   Pulse (!) 104   Temp 97.9  F (36.6 C) (Oral)   Resp 20   Ht _0  (1.626 m)   Wt 71.7 kg   SpO2 98%   BMI 27.13 kg/m  Pain Scale: 0-10   Pain Score: 0-No pain   SpO2: SpO2: 98 % O2 Device:SpO2: 98 % O2 Flow Rate: .O2 Flow Rate (L/min): 2 L/min  IO: Intake/output summary:  Intake/Output Summary (Last 24 hours) at 07/21/2021 1108 Last data filed at 07/21/2021 0910 Gross per 24 hour  Intake 476 ml  Output 900 ml  Net -424 ml    LBM: Last BM Date: 07/20/21 Baseline Weight: Weight: 74.4 kg Most recent weight: Weight: 71.7 kg     Palliative Assessment/Data: PPS: 50%     Thank you for this consult. Palliative medicine will continue to follow and assist as needed.   Time In: 1101 Time Out: 1221 Time Total: 81 minutes Greater than 50%  of this time was spent counseling and coordinating care related to the above assessment and plan.  Signed by: Mariana Kaufman, AGNP-C Palliative Medicine    Please contact Palliative Medicine Team phone at [phone] for questions and concerns.  For individual provider: See Shea Evans

## 2021-07-21 NOTE — Progress Notes (Signed)
HD#4 SUBJECTIVE:  Patient Summary: Joanna [name] is a 80 y.o. with a pertinent PMH of valvular atrial fibrillatin, chronic right heart failure with mitral and tricuspid regurgitation, prior CVA's, who presented with increasing shortness of breath and leg swelling and admitted for acute on chronic heart failure.     Overnight Events: Patient states that she rested well overnight. She denies shortness of breath, but states that she gets upset when she has to discuss next steps for her.  OBJECTIVE:  Vital Signs: Vitals:   07/19/21 2049 07/20/21 0611 07/20/21 1116 07/20/21 2020  BP: (!) 109/59 119/84 122/75 116/79  Pulse: (!) 104 (!) 102 (!) 105 96  Resp: (!) 22 20 19    Temp: 98.6 F (37 C) 98.5 F (36.9 C) 98.4 F (36.9 C) 98.4 F (36.9 C)  TempSrc: Oral Oral Oral Oral  SpO2: 100% 99% 98% 100%  Weight:  73.9 kg    Height:       Supplemental O2: Nasal Cannula SpO2: 100 % O2 Flow Rate (L/min): 2 L/min  Filed Weights   07/18/21 0436 07/19/21 0530 07/20/21 0611  Weight: 71.3 kg 72 kg 73.9 kg     Intake/Output Summary (Last 24 hours) at 07/21/2021 0559 Last data filed at 07/20/2021 2100 Gross per 24 hour  Intake 236 ml  Output 1120 ml  Net -884 ml   Net IO Since Admission: -3,869 mL [07/21/21 0559]  Physical Exam: General: Well-developed, well-nourished, in no acute distress HEENT: NCAT, conjunctiva clear CV: 3/6 Holosystolic murmur, no rubs, or gallops, Irregularly irregular mildly tachycardic Pulmonary: CTAB, normal pulmonary effort GI: no tenderness, bowel sounds present MSK: trace pitting edema in legs bilaterally with wrinkling, chronic venous stasis changes Psych: normal mood and affect  Patient Lines/Drains/Airways Status     Active Line/Drains/Airways     Name Placement date Placement time Site Days   Peripheral IV 07/19/2021 20 G Left Antecubital 07/15/2021  1556  Antecubital  5   External Urinary Catheter 07/20/21  1528  --  1            Pertinent  Labs: CBC Latest Ref Rng & Units 07/21/2021 07/20/2021 07/19/2021  WBC 4.0 - 10.5 K/uL 4.8 5.1 6.6  Hemoglobin 12.0 - 15.0 g/dL 10.8(L) 11.3(L) 10.9(L)  Hematocrit 36.0 - 46.0 % 33.7(L) 33.7(L) 34.2(L)  Platelets 150 - 400 K/uL 178 160 151    CMP Latest Ref Rng & Units 07/21/2021 07/20/2021 07/19/2021  Glucose 70 - 99 mg/dL 09/18/2021) 96 017(P)  BUN 8 - 23 mg/dL 23 102(H) 23  Creatinine 0.44 - 1.00 mg/dL 85(I 7.78 2.42)  Sodium 135 - 145 mmol/L 133(L) 134(L) 135  Potassium 3.5 - 5.1 mmol/L 4.9 4.3 3.7  Chloride 98 - 111 mmol/L 99 102 99  CO2 22 - 32 mmol/L 29 26 26   Calcium 8.9 - 10.3 mg/dL 8.3(L) 8.3(L) 8.7(L)  Total Protein 6.0 - 8.5 g/dL - - -  Total Bilirubin 0.0 - 1.2 mg/dL - - -  Alkaline Phos 39 - 117 IU/L - - -  AST 0 - 40 IU/L - - -  ALT 0 - 32 IU/L - - -    No results for input(s): GLUCAP in the last 72 hours.   Pertinent Imaging: DG Chest Port 1 View   Result Date: 07/18/2021 CLINICAL DATA:  Chest pain and shortness of breath EXAM: PORTABLE CHEST 1 VIEW COMPARISON:  Radiograph 07/15/2021, chest CT 10/07/2018 FINDINGS: Unchanged, markedly enlarged cardiac silhouette. Unchanged mild pulmonary edema. Decreased small right pleural  effusion. No visible pneumothorax. Bones are unchanged. IMPRESSION: Unchanged marked cardiomegaly with pulmonary vascular congestion/mild edema. Decreased small right pleural effusion. Electronically Signed   By: Caprice Renshaw M.D.   On: 07/18/2021 11:06    Echocardiogram from 07/17/2021:    1. Left ventricular ejection fraction, by estimation, is 55 to 60%. The  left ventricle has normal function. The left ventricle has no regional  wall motion abnormalities. Left ventricular diastolic parameters are  consistent with Grade I diastolic  dysfunction (impaired relaxation). There is the interventricular septum is  flattened in systole and diastole, consistent with right ventricular  pressure and volume overload.   2. Right ventricular systolic function is mildly  reduced. The right  ventricular size is severely enlarged. There is normal pulmonary artery  systolic pressure. The estimated right ventricular systolic pressure is  34.9 mmHg.   3. Left atrial size was severely dilated.   4. Right atrial size was massively dilated.   5. A small pericardial effusion is present. The pericardial effusion is  localized near the right atrium.   6. The mitral valve is normal in structure. Moderate to severe mitral  valve regurgitation. No evidence of mitral stenosis.   7. Tricuspid valve regurgitation is severe.   8. The aortic valve is normal in structure. Aortic valve regurgitation is  not visualized. No aortic stenosis is present.   9. Pulmonic valve regurgitation is moderate.  10. The inferior vena cava is dilated in size with <50% respiratory  variability, suggesting right atrial pressure of 15 mmHg.    ASSESSMENT/PLAN:  Assessment: Principal Problem:   Acute on chronic right heart failure (HCC) Active Problems:   Chronic atrial fibrillation   Mitral valve regurgitation   Plan: #Acute on Chronic HF exacerbation with preserved EF #Severe mitral regurgitation and tricuspid regurgitation Patient presented due to 2 weeks of increasing shortness of breath and leg swelling.  She has a known history of severe mitral and tricuspid regurgitation that she has previously refused interventions/procedures on.  Echo showed preserved EF with severe mitral and tricuspid regurgitation as seen on previous Echo.  During admission, patient has had lower blood pressures with diuresis.  This is due to preload dependence in the setting of right heart failure.  Cardiology consulted and stopped home medication of diltiazem 120 mg and changed it to digoxin.  This allowed [place] to continue with diuresis.  IV Lasix switched to p.o. Lasix.  Patient has intermittent episodes of Atrial Fibrillation with rates into the 140s.  Will increase metoprolol from 50 mg to 100 mg to help with rate  control.  Patient is unable to tolerate we will add amiodarone for further rate control..  Patient's dry weight thought to be around 64 kg, on admission she was at 72 kg.  Today, she is at 71.7 kg. Family meeting held with daughter Joanna [name] and son Joanna [name] with patient to discuss outlook of heart failure progression.  Patient states that she wishes to remain at home and be independent as possible.  Palliative care consulted and saw patient with daughter and son to discuss next steps.  He is considering option of palliative versus hospice at home.  Family is considering changing CODE STATUS.  We will follow-up tomorrow. -P.o. Lasix 40 mg once daily -home medications: Discontinued Cardizem 120 mg daily and metoprolol succinate 50 mg increased to 100 mg. -digoxin level low, pharmacy adjusting -daily BMP, K>4, MG>2 -Strict I/Os -palliative following -Work on clarifying goals of care   #Valvular atrial fibrillation on  coumadin Patient has had intermittent runs of atrial fibrillation with rates in the 140s.  We will increase metoprolol to help with rate control. TSH normal. INR 3.9 on arrival, goal 2-3. Appreciate pharmacy's assistance with warfarin dosing.  Today INR at 1.9. - Warfarin per pharmacy - Increased metoprolol succinate 50mg  to 100 mg daily - discontinue Cardizem 120mg  daily - Digoxin level low, will adjust   #Macrocytic anemia#thrombocytopenia-resolved Hbg downtrending fom 11.2-->11,1->10.9-->11.3-->10.8, Platelet count of 160K-->178K.  Folate wnl, Vitamin B-12 wnl -Daily CBC   Best Practice: Diet: Cardiac diet IVF: Fluids: none VTE: warfarin Code: Full AB: none Family Contact: Joanna [name] and , her daughter and son DISPO: Anticipated discharge in 1-2 days to SNF pending Medical stability.  Signature: , D.O. Internal Medicine Resident, PGY-1 [name]  Pager: [phone] 5:59 AM, 07/21/2021   Please contact the on call pager after 5 pm  and on weekends at [phone].

## 2021-07-21 NOTE — Progress Notes (Signed)
Progress Note  Patient Name: Joanna [name] Date of Encounter: 07/21/2021  [facility] Cardiologist: Donato Schultz, MD   Subjective   Overall no significant increases in shortness of breath or chest pain.  Inpatient Medications    Scheduled Meds:  digoxin  0.125 mg Oral Daily   furosemide  40 mg Oral Daily   metoprolol succinate  50 mg Oral QPM   polyethylene glycol  17 g Oral Daily   potassium chloride  40 mEq Oral BID   sodium chloride flush  3 mL Intravenous Once   Warfarin - Pharmacist Dosing Inpatient   Does not apply q1600   Continuous Infusions:  PRN Meds: acetaminophen **OR** acetaminophen, ondansetron (ZOFRAN) IV, senna-docusate   Vital Signs    Vitals:   07/20/21 0611 07/20/21 1116 07/20/21 2020 07/21/21 0653  BP: 119/84 122/75 116/79 132/88  Pulse: (!) 102 (!) 105 96 (!) 104  Resp: 20 19  20   Temp: 98.5 F (36.9 C) 98.4 F (36.9 C) 98.4 F (36.9 C) 97.9 F (36.6 C)  TempSrc: Oral Oral Oral Oral  SpO2: 99% 98% 100% 98%  Weight: 73.9 kg   71.7 kg  Height:        Intake/Output Summary (Last 24 hours) at 07/21/2021 0947 Last data filed at 07/21/2021 0910 Gross per 24 hour  Intake 476 ml  Output 1620 ml  Net -1144 ml   Last 3 Weights 07/21/2021 07/20/2021 07/19/2021  Weight (lbs) 158 lb 1.1 oz 162 lb 14.7 oz 158 lb 11.7 oz  Weight (kg) 71.7 kg 73.9 kg 72 kg      Telemetry    Atrial fibrillation with heart rates in the low 100s currently- Personally Reviewed  ECG    EKG from 07/20/2021 shows atrial fibrillation heart rate 116- Personally Reviewed  Physical Exam   GEN: No acute distress.   Neck: No JVD Cardiac: Irregularly irregular mildly tachycardic, 2/6 holosystolic murmur, no rubs, or gallops.  Respiratory: Clear to auscultation bilaterally. GI: Soft, nontender, non-distended  MS: Mild lower extremity edema; No deformity. Neuro:  Nonfocal  Psych: Normal affect   Labs    High Sensitivity Troponin:   Recent Labs  Lab 08/12/2021 1619  08/09/2021 1806  TROPONINIHS 17 19*      Chemistry Recent Labs  Lab 07/19/21 0143 07/20/21 0328 07/21/21 0138  NA 135 134* 133*  K 3.7 4.3 4.9  CL 99 102 99  CO2 26 26 29   GLUCOSE 115* 96 107*  BUN 23 24* 23  CREATININE 1.16* 0.93 0.84  CALCIUM 8.7* 8.3* 8.3*  GFRNONAA 48* >60 >60  ANIONGAP 10 6 5      Hematology Recent Labs  Lab 07/19/21 0143 07/20/21 0328 07/21/21 0138  WBC 6.6 5.1 4.8  RBC 3.38* 3.41* 3.34*  HGB 10.9* 11.3* 10.8*  HCT 34.2* 33.7* 33.7*  MCV 101.2* 98.8 100.9*  MCH 32.2 33.1 32.3  MCHC 31.9 33.5 32.0  RDW 14.2 14.0 14.0  PLT 151 160 178    BNP Recent Labs  Lab 08/06/2021 1623  BNP 1,307.3*     DDimer No results for input(s): DDIMER in the last 168 hours.   Radiology    No results found.  Cardiac Studies   Echocardiogram from 07/17/2021:    1. Left ventricular ejection fraction, by estimation, is 55 to 60%. The  left ventricle has normal function. The left ventricle has no regional  wall motion abnormalities. Left ventricular diastolic parameters are  consistent with Grade I diastolic  dysfunction (impaired relaxation).  There is the interventricular septum is  flattened in systole and diastole, consistent with right ventricular  pressure and volume overload.   2. Right ventricular systolic function is mildly reduced. The right  ventricular size is severely enlarged. There is normal pulmonary artery  systolic pressure. The estimated right ventricular systolic pressure is  34.9 mmHg.   3. Left atrial size was severely dilated.   4. Right atrial size was massively dilated.   5. A small pericardial effusion is present. The pericardial effusion is  localized near the right atrium.   6. The mitral valve is normal in structure. Moderate to severe mitral  valve regurgitation. No evidence of mitral stenosis.   7. Tricuspid valve regurgitation is severe.   8. The aortic valve is normal in structure. Aortic valve regurgitation is  not  visualized. No aortic stenosis is present.   9. Pulmonic valve regurgitation is moderate.  10. The inferior vena cava is dilated in size with <50% respiratory  variability, suggesting right atrial pressure of 15 mmHg.   Comparison(s): No significant change from prior study. Prior images  reviewed side by side.   Patient Profile     80 y.o. female with a hx of chronic right heart failure, severe mitral regurgitation, severe tricuspid regurgitation, permanent atrial fibrillation on Coumadin, hypertension, obesity, hx of MCA CVA 2012, who is being seen 07/18/2021 for the evaluation of decompensated heart failure at the request of [name].   Assessment & Plan    Acute on chronic RV failure with severe mitral regurgitation and severe tricuspid regurgitation with permanent atrial fibrillation and hypotension - Goals of care discussion, palliative care.  I discussed further with primary team.   -Tachycardia slightly more pronounced today.  We will trial increased metoprolol.  Discussed with team.  If blood pressure does not support, we can always add amiodarone for further rate control. -Not an invasive candidate.  Previously had refused invasive procedures after discussing with structural heart team.   For questions or updates, please contact [facility] Please consult [email or web address] for contact info under        Signed, Donato Schultz, MD  07/21/2021, 9:47 AM

## 2021-07-22 DIAGNOSIS — I50813 Acute on chronic right heart failure: Secondary | ICD-10-CM | POA: Diagnosis not present

## 2021-07-22 DIAGNOSIS — Z7189 Other specified counseling: Secondary | ICD-10-CM | POA: Diagnosis not present

## 2021-07-22 LAB — CBC
HCT: 35.2 % — ABNORMAL LOW (ref 36.0–46.0)
Hemoglobin: 11.2 g/dL — ABNORMAL LOW (ref 12.0–15.0)
MCH: 32.2 pg (ref 26.0–34.0)
MCHC: 31.8 g/dL (ref 30.0–36.0)
MCV: 101.1 fL — ABNORMAL HIGH (ref 80.0–100.0)
Platelets: 198 10*3/uL (ref 150–400)
RBC: 3.48 MIL/uL — ABNORMAL LOW (ref 3.87–5.11)
RDW: 14.1 % (ref 11.5–15.5)
WBC: 5.5 10*3/uL (ref 4.0–10.5)
nRBC: 0 % (ref 0.0–0.2)

## 2021-07-22 LAB — PROTIME-INR
INR: 2 — ABNORMAL HIGH (ref 0.8–1.2)
Prothrombin Time: 22.6 seconds — ABNORMAL HIGH (ref 11.4–15.2)

## 2021-07-22 LAB — BASIC METABOLIC PANEL
Anion gap: 7 (ref 5–15)
BUN: 20 mg/dL (ref 8–23)
CO2: 29 mmol/L (ref 22–32)
Calcium: 8.9 mg/dL (ref 8.9–10.3)
Chloride: 97 mmol/L — ABNORMAL LOW (ref 98–111)
Creatinine, Ser: 0.92 mg/dL (ref 0.44–1.00)
GFR, Estimated: >60 mL/min (ref >60)
Glucose, Bld: 104 mg/dL — ABNORMAL HIGH (ref 70–99)
Potassium: 5.1 mmol/L (ref 3.5–5.1)
Sodium: 133 mmol/L — ABNORMAL LOW (ref 135–145)

## 2021-07-22 LAB — MAGNESIUM: Magnesium: 2.2 mg/dL (ref 1.7–2.4)

## 2021-07-22 MED ORDER — WARFARIN SODIUM 5 MG PO TABS
5.0000 mg | ORAL_TABLET | Freq: Once | ORAL | Status: AC
  Administered 2021-07-22: 5 mg via ORAL
  Filled 2021-07-22: qty 1

## 2021-07-22 NOTE — Progress Notes (Addendum)
HD#5 SUBJECTIVE:  Patient Summary: Joanna [name] is a 80 y.o. with a pertinent PMH of valvular atrial fibrillatin, chronic right heart failure with mitral and tricuspid regurgitation, prior CVA's, who presented with increasing shortness of breath and leg swelling and admitted for acute on chronic heart failure.   Overnight Events: Patient assessed at bedside.  She states she needs to go to the bathroom, denies shortness of breath, abdominal pain, or chest pain.     OBJECTIVE:  Vital Signs: Vitals:   07/21/21 0653 07/21/21 1127 07/21/21 1919 07/22/21 0506  BP: 132/88 129/77 135/87 116/74  Pulse: (!) 104 (!) 102 (!) 110 94  Resp: 20 18 18 18   Temp: 97.9 F (36.6 C) (!) 97.5 F (36.4 C) 98.6 F (37 C) 98.6 F (37 C)  TempSrc: Oral Oral Oral Oral  SpO2: 98% 98% 90% 100%  Weight: 71.7 kg   72 kg  Height:       Supplemental O2: Nasal Cannula SpO2: 100 % O2 Flow Rate (L/min): 2 L/min  Filed Weights   07/20/21 0611 07/21/21 0653 07/22/21 0506  Weight: 73.9 kg 71.7 kg 72 kg     Intake/Output Summary (Last 24 hours) at 07/22/2021 0542 Last data filed at 07/22/2021 0500 Gross per 24 hour  Intake 526 ml  Output 1750 ml  Net -1224 ml   Net IO Since Admission: -5,293 mL [07/22/21 0542]  Physical Exam: General: Well-developed, well-nourished, in no acute distress HEENT: NCAT, conjunctiva clear CV: 3/6 Holosystolic murmur, no rubs, or gallops, Irregularly irregular mildly tachycardic Pulmonary: CTAB, normal pulmonary effort GI: no tenderness, bowel sounds present MSK: trace pitting edema in legs bilaterally with wrinkling, chronic venous stasis changes Psych: normal mood and affect  Patient Lines/Drains/Airways Status     Active Line/Drains/Airways     Name Placement date Placement time Site Days   Peripheral IV August 14, 2021 20 G Left Antecubital 14-Aug-2021  1556  Antecubital  6   External Urinary Catheter 07/20/21  1528  --  2            Pertinent Labs: CBC Latest Ref  Rng & Units 07/22/2021 07/21/2021 07/20/2021  WBC 4.0 - 10.5 K/uL 5.5 4.8 5.1  Hemoglobin 12.0 - 15.0 g/dL 11.2(L) 10.8(L) 11.3(L)  Hematocrit 36.0 - 46.0 % 35.2(L) 33.7(L) 33.7(L)  Platelets 150 - 400 K/uL 198 178 160    CMP Latest Ref Rng & Units 07/22/2021 07/21/2021 07/20/2021  Glucose 70 - 99 mg/dL 09/19/2021) 300(P) 96  BUN 8 - 23 mg/dL 20 23 233(A)  Creatinine 0.44 - 1.00 mg/dL 07(M 2.26 3.33  Sodium 135 - 145 mmol/L 133(L) 133(L) 134(L)  Potassium 3.5 - 5.1 mmol/L 5.1 4.9 4.3  Chloride 98 - 111 mmol/L 97(L) 99 102  CO2 22 - 32 mmol/L 29 29 26   Calcium 8.9 - 10.3 mg/dL 8.9 5.45) 8.3(L)  Total Protein 6.0 - 8.5 g/dL - - -  Total Bilirubin 0.0 - 1.2 mg/dL - - -  Alkaline Phos 39 - 117 IU/L - - -  AST 0 - 40 IU/L - - -  ALT 0 - 32 IU/L - - -    No results for input(s): GLUCAP in the last 72 hours.   Pertinent Imaging: DG Chest Port 1 View   Result Date: 07/18/2021 CLINICAL DATA:  Chest pain and shortness of breath EXAM: PORTABLE CHEST 1 VIEW COMPARISON:  Radiograph 14-Aug-2021, chest CT 10/07/2018 FINDINGS: Unchanged, markedly enlarged cardiac silhouette. Unchanged mild pulmonary edema. Decreased small right pleural effusion. No visible  pneumothorax. Bones are unchanged. IMPRESSION: Unchanged marked cardiomegaly with pulmonary vascular congestion/mild edema. Decreased small right pleural effusion. Electronically Signed   By: Caprice Renshaw M.D.   On: 07/18/2021 11:06     Echocardiogram from 07/17/2021:    1. Left ventricular ejection fraction, by estimation, is 55 to 60%. The  left ventricle has normal function. The left ventricle has no regional  wall motion abnormalities. Left ventricular diastolic parameters are  consistent with Grade I diastolic  dysfunction (impaired relaxation). There is the interventricular septum is  flattened in systole and diastole, consistent with right ventricular  pressure and volume overload.   2. Right ventricular systolic function is mildly reduced. The right   ventricular size is severely enlarged. There is normal pulmonary artery  systolic pressure. The estimated right ventricular systolic pressure is  34.9 mmHg.   3. Left atrial size was severely dilated.   4. Right atrial size was massively dilated.   5. A small pericardial effusion is present. The pericardial effusion is  localized near the right atrium.   6. The mitral valve is normal in structure. Moderate to severe mitral  valve regurgitation. No evidence of mitral stenosis.   7. Tricuspid valve regurgitation is severe.   8. The aortic valve is normal in structure. Aortic valve regurgitation is  not visualized. No aortic stenosis is present.   9. Pulmonic valve regurgitation is moderate.  10. The inferior vena cava is dilated in size with <50% respiratory  variability, suggesting right atrial pressure of 15 mmHg.    ASSESSMENT/PLAN:  Assessment: Principal Problem:   Acute on chronic right heart failure (HCC) Active Problems:   Chronic atrial fibrillation   Mitral valve regurgitation   Plan: #Acute on Chronic HF exacerbation with preserved EF #Severe mitral regurgitation and tricuspid regurgitation Patient presented due to 2 weeks of increasing shortness of breath and leg swelling.  She has a known history of severe mitral and tricuspid regurgitation that she has previously refused interventions/procedures on.  Echo showed preserved EF with severe mitral and tricuspid regurgitation as seen on previous Echo.  During admission, patient has had lower blood pressures with diuresis.  This is due to preload dependence in the setting of right heart failure.  Cardiology consulted and stopped home medication of diltiazem 120 mg and changed it to digoxin.  This allowed [place] to continue with diuresis.  IV Lasix switched to p.o. Lasix.  Patient has intermittent episodes of Atrial Fibrillation with rates into the 140s.  Increased metoprolol from 50 mg to 100 mg to help with rate control.  Patient has  tolerated increased metoprolol with no further episodes of elevated pulse rates.  Patient's dry weight thought to be around 64 kg, on admission she was at 72 kg.  Today, she is at 72 kg.  Palliative was consulted to help with questions on next steps.  Patient and family wish to go home with hospice. -P.o. Lasix 40 mg once daily -home medications: Discontinued Cardizem 120 mg daily and metoprolol succinate 50 mg increased to 100 mg. -daily BMP, K>4, MG>2 -Strict I/Os -palliative following -patient wants to go home with hospice   #Valvular atrial fibrillation on coumadin Patient had intermittent runs of atrial fibrillation with rates in the 140s.  We increased metoprolol to help with rate control. TSH normal. INR 3.9 on arrival, goal 2-3. Appreciate pharmacy's assistance with warfarin dosing.  Today INR at 1.9. - Warfarin per pharmacy - Continue metoprolol succinate 100 mg daily - discontinue Cardizem 120mg     #  Macrocytic anemia#thrombocytopenia-resolved Hbg downtrending fom 11.2-->11,1->10.9-->11.3-->10.8, Platelet count of 160K-->178K.  Folate wnl, Vitamin B-12 wnl   Best Practice: Diet: Cardiac diet IVF: Fluids: none VTE: warfarin Code: Full AB: none Family Contact: [name] and Alinda Money, her daughter and son DISPO: Anticipated discharge in 1-2 days to home with hospice pending Medical stability.  Signature: Rudene Christians, D.O. Internal Medicine Resident, PGY-1 [facility]  Pager: [phone] 5:42 AM, 07/22/2021   Please contact the on call pager after 5 pm and on weekends at [phone].

## 2021-07-22 NOTE — Progress Notes (Signed)
ANTICOAGULATION CONSULT NOTE  Pharmacy Consult:  Coumadin Indication: atrial fibrillation  No Known Allergies  Patient Measurements: Height: 5\' 4"  (162.6 cm) Weight: 72 kg (158 lb 11.7 oz) IBW/kg (Calculated) : 54.7  Vital Signs: Temp: 98.9 F (37.2 C) (09/08 0816) Temp Source: Oral (09/08 0816) BP: 136/80 (09/08 0816) Pulse Rate: 99 (09/08 0816)  Labs: Recent Labs    07/20/21 0328 07/21/21 0138 07/22/21 0316  HGB 11.3* 10.8* 11.2*  HCT 33.7* 33.7* 35.2*  PLT 160 178 198  LABPROT 21.8* 22.1* 22.6*  INR 1.9* 1.9* 2.0*  CREATININE 0.93 0.84 0.92     Estimated Creatinine Clearance: 47.4 mL/min (by C-G formula based on SCr of 0.92 mg/dL).  Assessment: 80 yo F with SOB, CXR with mild pulm edema and small R pleural effusion.  Pharmacy consulted to continue Coumadin from PTA for history of Afib.  INR therapeutic today at 2; no bleeding reported.   PTA regimen: 7.5mg  daily MWF, 5 mg all other days. INR 3.9 on admit.  Goal of Therapy:  INR 2-3 Monitor platelets by anticoagulation protocol: Yes   Plan:  Coumadin 5mg  PO today Daily PT / INR F/u [facility]  Damontae Loppnow D. 96, PharmD, BCPS, BCCCP 07/22/2021, 8:54 AM

## 2021-07-22 NOTE — Progress Notes (Addendum)
[facility]) Hospital Liaison: RN note    Notified by Transition of Care Manger of patient/family request for [facility] services at home after discharge. Chart and patient information under review by [facility] physician. Hospice eligibility pending currently.    Writer spoke with daughter [name] to initiate education related to hospice philosophy, services and team approach to care.  [name] verbalized understanding of information given.   Per discussion, plan is for discharge to home by PTAR. Please send signed and completed DNR form home with patient/family. Patient will need prescriptions for discharge comfort medications.     DME needs have been discussed, patient currently has the following equipment in the home: walker.  Patient/family requests the following DME for delivery to the home: Bed, OBT, BSC, WC, Oxygen. [facility] equipment manager has been notified and will contact DME provider to arrange delivery to the home. Home address has been verified and is correct in the chart. [name] is the family member to contact to arrange time of delivery.     [facility] aware of the above. Please notify [facility] when patient is ready to leave the unit at discharge. (Call [phone] or [phone] after 5pm.) [facility] information and contact numbers given to [name].      Please call with any hospice related questions.     Thank you for this referral.     Yolande Jolly, BSN, [facility] (listed on AMION under [facility] [place])  [phone]

## 2021-07-22 NOTE — Progress Notes (Signed)
Daily Progress Note   Patient Name: Joanna [name]       Date: 07/22/2021 DOB: [birth year]  Age: 80 y.o. MRN#: [id number] Attending Physician: Tyson Alias, * Primary Care Physician: Margot Ables, MD Admit Date: August 04, 2021  Reason for Consultation/Follow-up: Establishing goals of care  Subjective: Medical records reviewed. Patient assessed at the bedside. She is in good spirits and denies pain or distress. No family present during my visit.  I then called patient's daughter [name] and left a voicemail to follow up on goals of care conversation. She was able to call me back and confirm that after family discussions, both her and her brother are in agreement with a plan to discharge patient back home with hospice. Emotional support and therapeutic listening was provided. Family is at peace and look forward to patient's return home.  Created space and opportunity for questions and concerns regarding code status. [name] shares that the family has made a decision for DNR. Discussed specifics of ACLS medications and how this is not aligned with hospice philosophy.  Questions and concerns addressed. PMT will continue to support holistically.  Length of Stay: 5  Current Medications: Scheduled Meds:   digoxin  0.125 mg Oral Daily   furosemide  40 mg Oral Daily   metoprolol succinate  100 mg Oral QPM   polyethylene glycol  17 g Oral Daily   sodium chloride flush  3 mL Intravenous Once   warfarin  5 mg Oral ONCE-1600   Warfarin - Pharmacist Dosing Inpatient   Does not apply q1600    Continuous Infusions:   PRN Meds: acetaminophen **OR** acetaminophen, ondansetron (ZOFRAN) IV, senna-docusate  Physical Exam Vitals and nursing note reviewed.  Constitutional:       General: She is not in acute distress. Cardiovascular:     Rate and Rhythm: Normal rate.  Pulmonary:     Effort: Pulmonary effort is normal.  Skin:    General: Skin is warm and dry.  Neurological:     Mental Status: She is alert.            Vital Signs: BP 136/80   Pulse 99   Temp 98.9 F (37.2 C) (Oral)   Resp 18   Ht 5\' 4"  (1.626 m)   Wt 72 kg   SpO2 100%   BMI 27.25  kg/m  SpO2: SpO2: 100 % O2 Device: O2 Device: Nasal Cannula O2 Flow Rate: O2 Flow Rate (L/min): 2 L/min  Intake/output summary:  Intake/Output Summary (Last 24 hours) at 07/22/2021 1526 Last data filed at 07/22/2021 0500 Gross per 24 hour  Intake --  Output 850 ml  Net -850 ml   LBM: Last BM Date: 07/22/21 Baseline Weight: Weight: 74.4 kg Most recent weight: Weight: 72 kg       Palliative Assessment/Data: 40-50%      Patient Active Problem List   Diagnosis Date Noted   Mitral valve regurgitation 07/17/2021   Acute on chronic right heart failure (HCC) 07/18/2021   Chronic kidney disease    Hypertension    Chronic atrial fibrillation 10/26/2015   Encounter for therapeutic drug monitoring 01/03/2014   Left ventricular dysfunction    Atrial fibrillation (HCC) 02/04/2011    Palliative Care Assessment & Plan   Patient Profile: 80 y.o. female  with past medical history of  structural heart disease, valvular atrial fibrillation, mitral regurgitation, tricuspid regurgitation, right heart dysfunction, and ischemic vascular disease with prior cerebral infarctions, permanent a-fib admitted on 08/08/2021 with SOB, chest pain and leg swelling in the setting of not taking her lasix at home. She is admitted and being treated for heart failure exacerbation with severe mitral and severe tricuspid regurgitation.  She in not a candidate nor does she choose to have further invasive tx for her heart failure. Palliative medicine consulted for  "Right Heart Failure with acute exacerbationRight Heart Failure with acute  exacerbation.  Difficulty with diuresis due to pre-load dependence.  Patient states she does not want further interventions." This is her first hospitalization for this problem since 2019  Assessment: Acute on chronic HF exacerbation Severe MR and TR Atrial fibrillation Goals of car conversation  Recommendations/Plan: DNR confirmed; gold form signed and placed on patient's hard chart for discharge. Copy to be scanned into EMR Patient's daughter confirms family has made decision for home with hospice, continue current supportive measures until discharge Psychosocial and emotional support provided Ongoing support from PMT  Goals of Care and Additional Recommendations: Limitations on Scope of Treatment: Avoid Hospitalization  Prognosis:  < 6 months  Discharge Planning: Home with Hospice  Care plan was discussed with patient, patient's daughter [name]  Total time: 15 minutes Greater than 50% of this time was spent in counseling and coordinating care related to the above assessment and plan.  Richardson Dopp, PA-C Palliative Medicine Team Team phone # [phone]  Thank you for allowing the Palliative Medicine Team to assist in the care of this patient. Please utilize secure chat with additional questions, if there is no response within 30 minutes please call the above phone number.  Palliative Medicine Team providers are available by phone from 7am to 7pm daily and can be reached through the team cell phone.  Should this patient require assistance outside of these hours, please call the patient's attending physician.

## 2021-07-22 NOTE — Progress Notes (Signed)
Progress Note  Patient Name: Joanna [name] Date of Encounter: 07/22/2021  [facility] Cardiologist: Donato Schultz, MD   Subjective   No changes. No CP  Inpatient Medications    Scheduled Meds:  digoxin  0.125 mg Oral Daily   furosemide  40 mg Oral Daily   metoprolol succinate  100 mg Oral QPM   polyethylene glycol  17 g Oral Daily   sodium chloride flush  3 mL Intravenous Once   warfarin  5 mg Oral ONCE-1600   Warfarin - Pharmacist Dosing Inpatient   Does not apply q1600   Continuous Infusions:  PRN Meds: acetaminophen **OR** acetaminophen, ondansetron (ZOFRAN) IV, senna-docusate   Vital Signs    Vitals:   07/21/21 1127 07/21/21 1919 07/22/21 0506 07/22/21 0816  BP: 129/77 135/87 116/74 136/80  Pulse: (!) 102 (!) 110 94 99  Resp: 18 18 18 18   Temp: (!) 97.5 F (36.4 C) 98.6 F (37 C) 98.6 F (37 C) 98.9 F (37.2 C)  TempSrc: Oral Oral Oral Oral  SpO2: 98% 90% 100% 100%  Weight:   72 kg   Height:        Intake/Output Summary (Last 24 hours) at 07/22/2021 1223 Last data filed at 07/22/2021 0500 Gross per 24 hour  Intake 236 ml  Output 1450 ml  Net -1214 ml   Last 3 Weights 07/22/2021 07/21/2021 07/20/2021  Weight (lbs) 158 lb 11.7 oz 158 lb 1.1 oz 162 lb 14.7 oz  Weight (kg) 72 kg 71.7 kg 73.9 kg      Telemetry    Atrial fibrillation with heart rates in the 90's currently- Personally Reviewed  ECG    EKG from 07/20/2021 shows atrial fibrillation heart rate 116- Personally Reviewed  Physical Exam   GEN: Well nourished, well developed, in no acute distress HEENT: normal Neck: no JVD, carotid bruits, or masses Cardiac: Irreg ; 2/6 HSM, no rubs, or gallops,no edema  Respiratory:  clear to auscultation bilaterally, normal work of breathing GI: soft, nontender, nondistended, + BS MS: no deformity or atrophy Skin: warm and dry, no rash Neuro:  Alert and Oriented x 3, Strength and sensation are intact Psych: euthymic mood, full affect   Labs    High  Sensitivity Troponin:   Recent Labs  Lab 08/12/2021 1619 08-12-21 1806  TROPONINIHS 17 19*      Chemistry Recent Labs  Lab 07/20/21 0328 07/21/21 0138 07/22/21 0316  NA 134* 133* 133*  K 4.3 4.9 5.1  CL 102 99 97*  CO2 26 29 29   GLUCOSE 96 107* 104*  BUN 24* 23 20  CREATININE 0.93 0.84 0.92  CALCIUM 8.3* 8.3* 8.9  GFRNONAA >60 >60 >60  ANIONGAP 6 5 7      Hematology Recent Labs  Lab 07/20/21 0328 07/21/21 0138 07/22/21 0316  WBC 5.1 4.8 5.5  RBC 3.41* 3.34* 3.48*  HGB 11.3* 10.8* 11.2*  HCT 33.7* 33.7* 35.2*  MCV 98.8 100.9* 101.1*  MCH 33.1 32.3 32.2  MCHC 33.5 32.0 31.8  RDW 14.0 14.0 14.1  PLT 160 178 198    BNP Recent Labs  Lab 08-12-2021 1623  BNP 1,307.3*     DDimer No results for input(s): DDIMER in the last 168 hours.   Radiology    No results found.  Cardiac Studies   Echocardiogram from 07/17/2021:    1. Left ventricular ejection fraction, by estimation, is 55 to 60%. The  left ventricle has normal function. The left ventricle has no regional  wall  motion abnormalities. Left ventricular diastolic parameters are  consistent with Grade I diastolic  dysfunction (impaired relaxation). There is the interventricular septum is  flattened in systole and diastole, consistent with right ventricular  pressure and volume overload.   2. Right ventricular systolic function is mildly reduced. The right  ventricular size is severely enlarged. There is normal pulmonary artery  systolic pressure. The estimated right ventricular systolic pressure is  34.9 mmHg.   3. Left atrial size was severely dilated.   4. Right atrial size was massively dilated.   5. A small pericardial effusion is present. The pericardial effusion is  localized near the right atrium.   6. The mitral valve is normal in structure. Moderate to severe mitral  valve regurgitation. No evidence of mitral stenosis.   7. Tricuspid valve regurgitation is severe.   8. The aortic valve is normal  in structure. Aortic valve regurgitation is  not visualized. No aortic stenosis is present.   9. Pulmonic valve regurgitation is moderate.  10. The inferior vena cava is dilated in size with <50% respiratory  variability, suggesting right atrial pressure of 15 mmHg.   Comparison(s): No significant change from prior study. Prior images  reviewed side by side.   Patient Profile     80 y.o. female with a hx of chronic right heart failure, severe mitral regurgitation, severe tricuspid regurgitation, permanent atrial fibrillation on Coumadin, hypertension, obesity, hx of MCA CVA 2012, who is being seen 07/18/2021 for the evaluation of decompensated heart failure at the request of [name].   Assessment & Plan    Acute on chronic RV failure with severe mitral regurgitation and severe tricuspid regurgitation with permanent atrial fibrillation and hypotension - Goals of care discussion, palliative care note reviewed -Tachycardia slightly improved today.  Increased metoprolol 07/21/21.  Discussed with team.  If blood pressure does not support, we can always add amiodarone for further rate control. -Not an invasive candidate.  Previously had refused invasive procedures after discussing with structural heart team.   For questions or updates, please contact [facility] Please consult [email or web address] for contact info under        Signed, Donato Schultz, MD  07/22/2021, 12:23 PM

## 2021-07-22 NOTE — Plan of Care (Signed)
Patient progressing 

## 2021-07-22 NOTE — TOC Progression Note (Addendum)
Transition of Care [facility]) - Progression Note    Patient Details  Name: Joanna [name] MRN: [id number] Date of Birth: 02/03/1941  Transition of Care [facility]) CM/SW Contact  Leone Haven, RN Phone Number: 07/22/2021, 3:04 PM  Clinical Narrative:    NCM received consult for home hospice and daughter is the decision maker, NCM contacted daughter , offered choice, she chose [facility].  NCM made referral to Chrislyn with [facility].  Patient will need hospital bed, bsc, bedside table, w/chair, and oxygen.  Hospice will wok on getting DME delivered to patient's home.  Patient has a walker at home.  She will need ptar transport at discharge.   The DME will be delivered tomorrow to home address in system per [facility].   Expected Discharge Plan: Skilled Nursing Facility Barriers to Discharge: Continued Medical Work up  Expected Discharge Plan and Services Expected Discharge Plan: Skilled Nursing Facility In-house Referral: Clinical Social Work     Living arrangements for the past 2 months: Single Family Home                                       Social Determinants of Health (SDOH) Interventions    Readmission Risk Interventions No flowsheet data found.

## 2021-07-23 DIAGNOSIS — I50813 Acute on chronic right heart failure: Secondary | ICD-10-CM | POA: Diagnosis not present

## 2021-07-23 LAB — BASIC METABOLIC PANEL
Anion gap: 11 (ref 5–15)
BUN: 25 mg/dL — ABNORMAL HIGH (ref 8–23)
CO2: 25 mmol/L (ref 22–32)
Calcium: 8.8 mg/dL — ABNORMAL LOW (ref 8.9–10.3)
Chloride: 97 mmol/L — ABNORMAL LOW (ref 98–111)
Creatinine, Ser: 0.98 mg/dL (ref 0.44–1.00)
GFR, Estimated: 58 mL/min — ABNORMAL LOW (ref >60)
Glucose, Bld: 96 mg/dL (ref 70–99)
Potassium: 5.1 mmol/L (ref 3.5–5.1)
Sodium: 133 mmol/L — ABNORMAL LOW (ref 135–145)

## 2021-07-23 LAB — CULTURE, BLOOD (ROUTINE X 2)
Culture: NO GROWTH
Culture: NO GROWTH

## 2021-07-23 LAB — CBC
HCT: 36.2 % (ref 36.0–46.0)
Hemoglobin: 11.6 g/dL — ABNORMAL LOW (ref 12.0–15.0)
MCH: 33 pg (ref 26.0–34.0)
MCHC: 32 g/dL (ref 30.0–36.0)
MCV: 103.1 fL — ABNORMAL HIGH (ref 80.0–100.0)
Platelets: 238 10*3/uL (ref 150–400)
RBC: 3.51 MIL/uL — ABNORMAL LOW (ref 3.87–5.11)
RDW: 14.1 % (ref 11.5–15.5)
WBC: 6.8 10*3/uL (ref 4.0–10.5)
nRBC: 0 % (ref 0.0–0.2)

## 2021-07-23 LAB — PROTIME-INR
INR: 2.8 — ABNORMAL HIGH (ref 0.8–1.2)
Prothrombin Time: 29.6 seconds — ABNORMAL HIGH (ref 11.4–15.2)

## 2021-07-23 MED ORDER — WARFARIN SODIUM 5 MG PO TABS
5.0000 mg | ORAL_TABLET | Freq: Every day | ORAL | Status: DC
  Administered 2021-07-23: 5 mg via ORAL
  Filled 2021-07-23: qty 1

## 2021-07-23 MED ORDER — GERHARDT'S BUTT CREAM
TOPICAL_CREAM | CUTANEOUS | Status: DC | PRN
  Filled 2021-07-23: qty 1

## 2021-07-23 NOTE — Progress Notes (Signed)
HD#6 SUBJECTIVE:  Patient Summary: Joanna [name] is a 80 y.o. with a pertinent PMH of valvular atrial fibrillatin, chronic right heart failure with mitral and tricuspid regurgitation, prior CVA's, who presented with increasing shortness of breath and leg swelling and admitted for acute on chronic heart failure.   Overnight Events: Patient is lying in bed.  She denies shortness of breath and abdominal pain.  Appears more tired today.  OBJECTIVE:  Vital Signs: Vitals:   07/22/21 2039 07/23/21 0450 07/23/21 0900 07/23/21 1112  BP: (!) 114/94 120/78 102/60 114/87  Pulse: (!) 111 98 90 94  Resp: 18 17  18   Temp: 99.2 F (37.3 C) 98.1 F (36.7 C)  98.1 F (36.7 C)  TempSrc: Oral Oral  Oral  SpO2: 92% 98%  100%  Weight:  71.9 kg    Height:       Supplemental O2: Nasal Cannula SpO2: 100 % O2 Flow Rate (L/min): 2 L/min  Filed Weights   07/21/21 0653 07/22/21 0506 07/23/21 0450  Weight: 71.7 kg 72 kg 71.9 kg     Intake/Output Summary (Last 24 hours) at 07/23/2021 1410 Last data filed at 07/23/2021 0904 Gross per 24 hour  Intake 240 ml  Output 150 ml  Net 90 ml   Net IO Since Admission: -5,763 mL [07/23/21 1410]  Physical Exam: Physical Exam  Patient Lines/Drains/Airways Status     Active Line/Drains/Airways     Name Placement date Placement time Site Days   Peripheral IV 07/15/2021 20 G Left Antecubital 08/02/2021  1556  Antecubital  7   External Urinary Catheter 07/22/21  1250  --  1            Pertinent Labs: CBC Latest Ref Rng & Units 07/23/2021 07/22/2021 07/21/2021  WBC 4.0 - 10.5 K/uL 6.8 5.5 4.8  Hemoglobin 12.0 - 15.0 g/dL 11.6(L) 11.2(L) 10.8(L)  Hematocrit 36.0 - 46.0 % 36.2 35.2(L) 33.7(L)  Platelets 150 - 400 K/uL 238 198 178    CMP Latest Ref Rng & Units 07/23/2021 07/22/2021 07/21/2021  Glucose 70 - 99 mg/dL 96 09/20/2021) 389(H)  BUN 8 - 23 mg/dL 734(K) 20 23  Creatinine 0.44 - 1.00 mg/dL 87(G 8.11 5.72  Sodium 135 - 145 mmol/L 133(L) 133(L) 133(L)  Potassium  3.5 - 5.1 mmol/L 5.1 5.1 4.9  Chloride 98 - 111 mmol/L 97(L) 97(L) 99  CO2 22 - 32 mmol/L 25 29 29   Calcium 8.9 - 10.3 mg/dL 6.20) 8.9 )  Total Protein 6.0 - 8.5 g/dL - - -  Total Bilirubin 0.0 - 1.2 mg/dL - - -  Alkaline Phos 39 - 117 IU/L - - -  AST 0 - 40 IU/L - - -  ALT 0 - 32 IU/L - - -    No results for input(s): GLUCAP in the last 72 hours.   Pertinent Imaging: DG Chest Port 1 View   Result Date: 07/18/2021 CLINICAL DATA:  Chest pain and shortness of breath EXAM: PORTABLE CHEST 1 VIEW COMPARISON:  Radiograph 08/10/2021, chest CT 10/07/2018 FINDINGS: Unchanged, markedly enlarged cardiac silhouette. Unchanged mild pulmonary edema. Decreased small right pleural effusion. No visible pneumothorax. Bones are unchanged. IMPRESSION: Unchanged marked cardiomegaly with pulmonary vascular congestion/mild edema. Decreased small right pleural effusion. Electronically Signed   By: 09/15/2021 M.D.   On: 07/18/2021 11:06     Echocardiogram from 07/17/2021:    1. Left ventricular ejection fraction, by estimation, is 55 to 60%. The  left ventricle has normal function. The left ventricle  has no regional  wall motion abnormalities. Left ventricular diastolic parameters are  consistent with Grade I diastolic  dysfunction (impaired relaxation). There is the interventricular septum is  flattened in systole and diastole, consistent with right ventricular  pressure and volume overload.   2. Right ventricular systolic function is mildly reduced. The right  ventricular size is severely enlarged. There is normal pulmonary artery  systolic pressure. The estimated right ventricular systolic pressure is  34.9 mmHg.   3. Left atrial size was severely dilated.   4. Right atrial size was massively dilated.   5. A small pericardial effusion is present. The pericardial effusion is  localized near the right atrium.   6. The mitral valve is normal in structure. Moderate to severe mitral  valve  regurgitation. No evidence of mitral stenosis.   7. Tricuspid valve regurgitation is severe.   8. The aortic valve is normal in structure. Aortic valve regurgitation is  not visualized. No aortic stenosis is present.   9. Pulmonic valve regurgitation is moderate.  10. The inferior vena cava is dilated in size with <50% respiratory  variability, suggesting right atrial pressure of 15 mmHg.    ASSESSMENT/PLAN:  Assessment: Principal Problem:   Acute on chronic right heart failure (HCC) Active Problems:   Goals of care, counseling/discussion   Chronic atrial fibrillation   Mitral valve regurgitation   Plan: ##Acute on Chronic HF exacerbation with preserved EF #Severe mitral regurgitation and tricuspid regurgitation Patient presented due to 2 weeks of increasing shortness of breath and leg swelling.  She has a known history of severe mitral and tricuspid regurgitation that she has previously refused interventions/procedures on.  Echo showed preserved EF with severe mitral and tricuspid regurgitation as seen on previous Echo.  During admission, patient has had lower blood pressures with diuresis.  This is due to preload dependence in the setting of right heart failure.  Cardiology consulted and stopped home medication of diltiazem 120 mg and changed it to digoxin.  This allowed [place] to continue with diuresis.  IV Lasix switched to p.o. Lasix.  Patient had intermittent episodes of Atrial Fibrillation with rates into the 140s.  Increased metoprolol from 50 mg to 100 mg to help with rate control.  Patient's dry weight thought to be around 64 kg, on admission she was at 72 kg.  Palliative was consulted to help with questions on next steps.  Patient and family wish to go home with hospice.  Daughter, [name], stated that she received the equipment through the [facility] today.  However she does not feel prepared to have her mother at home to care for.  [name] states that she would like to hire a home  health aide and request time to find one.   -P.o. Lasix 40 mg once daily -home medications: Discontinued Cardizem 120 mg daily and metoprolol succinate 50 mg increased to 100 mg. -Strict I/Os -palliative following -patient wants to go home with hospice   #Valvular atrial fibrillation on coumadin Patient had intermittent runs of atrial fibrillation with rates in the 140s.  We increased metoprolol to help with rate control. TSH normal. INR 3.9 on arrival, goal 2-3. Appreciate pharmacy's assistance with warfarin dosing.  Today INR at 1.9. - Warfarin per pharmacy - Continue metoprolol succinate 100 mg daily - discontinue Cardizem 120mg     #Macrocytic anemia#thrombocytopenia-resolved Hbg improving to 11.6 today Platelet count of 160K-->178K-->238K today.  Folate wnl, Vitamin B-12 wnl  Best Practice: Diet: Cardiac diet IVF: Fluids: none VTE: warfarin  Code: Full AB: none Family Contact: [name] and Alinda Money, her daughter and son DISPO: Anticipated discharge in 1-2 days to home with hospice pending Medical stability.    Signature: Rudene Christians, D.O. Internal Medicine Resident, PGY-1 [facility]  Pager: [phone] 2:10 PM, 07/23/2021   Please contact the on call pager after 5 pm and on weekends at [phone].

## 2021-07-23 NOTE — Plan of Care (Signed)

## 2021-07-23 NOTE — Progress Notes (Signed)
Progress Note  Patient Name: Joanna [name] Date of Encounter: 07/23/2021  [facility] Cardiologist: Donato Schultz, MD   Subjective   Tired, going home with hospice.   Inpatient Medications    Scheduled Meds:  digoxin  0.125 mg Oral Daily   furosemide  40 mg Oral Daily   metoprolol succinate  100 mg Oral QPM   polyethylene glycol  17 g Oral Daily   sodium chloride flush  3 mL Intravenous Once   warfarin  5 mg Oral q1600   Warfarin - Pharmacist Dosing Inpatient   Does not apply q1600   Continuous Infusions:  PRN Meds: acetaminophen **OR** acetaminophen, Gerhardt's butt cream, ondansetron (ZOFRAN) IV, senna-docusate   Vital Signs    Vitals:   07/22/21 1700 07/22/21 2039 07/23/21 0450 07/23/21 0900  BP: 136/76 (!) 114/94 120/78 102/60  Pulse: 100 (!) 111 98 90  Resp:  18 17   Temp:  99.2 F (37.3 C) 98.1 F (36.7 C)   TempSrc:  Oral Oral   SpO2:  92% 98%   Weight:   71.9 kg   Height:        Intake/Output Summary (Last 24 hours) at 07/23/2021 1103 Last data filed at 07/23/2021 0904 Gross per 24 hour  Intake 240 ml  Output 950 ml  Net -710 ml   Last 3 Weights 07/23/2021 07/22/2021 07/21/2021  Weight (lbs) 158 lb 8.2 oz 158 lb 11.7 oz 158 lb 1.1 oz  Weight (kg) 71.9 kg 72 kg 71.7 kg      Telemetry    Atrial fibrillation with heart rates in the 90's currently, no changes- Personally Reviewed  ECG    EKG from 07/20/2021 shows atrial fibrillation heart rate 116- Personally Reviewed  Physical Exam   Tired, 3/6 SM   Labs    High Sensitivity Troponin:   Recent Labs  Lab 08/10/2021 1619 25-Jul-2021 1806  TROPONINIHS 17 19*      Chemistry Recent Labs  Lab 07/21/21 0138 07/22/21 0316 07/23/21 0336  NA 133* 133* 133*  K 4.9 5.1 5.1  CL 99 97* 97*  CO2 29 29 25   GLUCOSE 107* 104* 96  BUN 23 20 25*  CREATININE 0.84 0.92 0.98  CALCIUM 8.3* 8.9 8.8*  GFRNONAA >60 >60 58*  ANIONGAP 5 7 11      Hematology Recent Labs  Lab 07/21/21 0138 07/22/21 0316  07/23/21 0336  WBC 4.8 5.5 6.8  RBC 3.34* 3.48* 3.51*  HGB 10.8* 11.2* 11.6*  HCT 33.7* 35.2* 36.2  MCV 100.9* 101.1* 103.1*  MCH 32.3 32.2 33.0  MCHC 32.0 31.8 32.0  RDW 14.0 14.1 14.1  PLT 178 198 238    BNP Recent Labs  Lab 08/09/2021 1623  BNP 1,307.3*     DDimer No results for input(s): DDIMER in the last 168 hours.   Radiology    No results found.  Cardiac Studies   Echocardiogram from 07/17/2021:    1. Left ventricular ejection fraction, by estimation, is 55 to 60%. The  left ventricle has normal function. The left ventricle has no regional  wall motion abnormalities. Left ventricular diastolic parameters are  consistent with Grade I diastolic  dysfunction (impaired relaxation). There is the interventricular septum is  flattened in systole and diastole, consistent with right ventricular  pressure and volume overload.   2. Right ventricular systolic function is mildly reduced. The right  ventricular size is severely enlarged. There is normal pulmonary artery  systolic pressure. The estimated right ventricular systolic pressure  is  34.9 mmHg.   3. Left atrial size was severely dilated.   4. Right atrial size was massively dilated.   5. A small pericardial effusion is present. The pericardial effusion is  localized near the right atrium.   6. The mitral valve is normal in structure. Moderate to severe mitral  valve regurgitation. No evidence of mitral stenosis.   7. Tricuspid valve regurgitation is severe.   8. The aortic valve is normal in structure. Aortic valve regurgitation is  not visualized. No aortic stenosis is present.   9. Pulmonic valve regurgitation is moderate.  10. The inferior vena cava is dilated in size with <50% respiratory  variability, suggesting right atrial pressure of 15 mmHg.   Comparison(s): No significant change from prior study. Prior images  reviewed side by side.   Patient Profile     80 y.o. female with a hx of chronic right  heart failure, severe mitral regurgitation, severe tricuspid regurgitation, permanent atrial fibrillation on Coumadin, hypertension, obesity, hx of MCA CVA 2012, who is being seen 07/18/2021 for the evaluation of decompensated heart failure at the request of [name].   Assessment & Plan    Acute on chronic RV failure with severe mitral regurgitation and severe tricuspid regurgitation with permanent atrial fibrillation and hypotension - Goals of care discussion, palliative care note reviewed -Going home with HOSPICE. Agree with compassionate care.     For questions or updates, please contact [facility] Please consult [email or web address] for contact info under        Signed, Donato Schultz, MD  07/23/2021, 11:03 AM

## 2021-07-23 NOTE — Progress Notes (Addendum)
Occupational Therapy Treatment Patient Details Name: Joanna [name] MRN: [id number] DOB: 08-15-1941 Today's Date: 07/23/2021    History of present illness Pt is an 80 y.o. female who presented 07/15/2021 with increased SOB. Pt admitted with volume overload, acute on chronic right heart failure complicated by mitral and tricuspid regurgitation, as well as atrial fibrillation. She has not been taking Lasix the past few months with noted subsequent lower extremity edema. PMH of HFpEF, severe mitral and tricuspid regurgitation, atrial fibrillation on coumadin, HTN, and prior CVAs   OT comments  Pt with inconsistent participation. Performed activities at bed level as pt declined EOB and OOB. Rolled with max assist for pericare and linen change. Pt washed her face with set up. Appears fatigued, declined eating and drinking.   Follow Up Recommendations  SNF,  24 hour care    Equipment Recommendations  Hospital bed;Wheelchair (measurements OT);Wheelchair cushion (measurements OT)    Recommendations for Other Services      Precautions / Restrictions Precautions Precautions: Fall Precaution Comments: monitor SpO2 Restrictions Weight Bearing Restrictions: No       Mobility Bed Mobility Overal bed mobility: Needs Assistance Bed Mobility: Rolling Rolling: Max assist         General bed mobility comments: rolled for pericare, pt minimally assisting and unable to maintain sidelying with help during pericare    Transfers                 General transfer comment: pt refusing EOB/OOB    Balance                                           ADL either performed or assessed with clinical judgement   ADL Overall ADL's : Needs assistance/impaired   Eating/Feeding Details (indicate cue type and reason): asking for orange juice, but then refusing to drink Grooming: Wash/dry hands;Bed level;Set up Grooming Details (indicate cue type and reason): asking to brush her  teeth, but when given toothbrush, refused and asked to wash her face                     Toileting- Clothing Manipulation and Hygiene: Total assistance;Bed level Toileting - Clothing Manipulation Details (indicate cue type and reason): pt with b/b incontinence             Vision       Perception     Praxis      Cognition Arousal/Alertness: Awake/alert Behavior During Therapy: Flat affect Overall Cognitive Status: Impaired/Different from baseline Area of Impairment: Attention;Memory;Following commands;Safety/judgement;Awareness;Problem solving                   Current Attention Level: Sustained Memory: Decreased short-term memory Following Commands: Follows one step commands with increased time;Follows one step commands inconsistently Safety/Judgement: Decreased awareness of safety;Decreased awareness of deficits Awareness: Emergent Problem Solving: Slow processing;Decreased initiation;Difficulty sequencing;Requires verbal cues;Requires tactile cues General Comments: making requests, but then states no when requests are fulfilled        Exercises     Shoulder Instructions       General Comments      Pertinent Vitals/ Pain       Pain Assessment: Faces Faces Pain Scale: No hurt  Home Living  Prior Functioning/Environment              Frequency  Min 2X/week        Progress Toward Goals  OT Goals(current goals can now be found in the care plan section)  Progress towards OT goals: Not progressing toward goals - comment  Acute Rehab OT Goals Patient Stated Goal: did not state OT Goal Formulation: With patient Time For Goal Achievement: 08/02/21 Potential to Achieve Goals: Fair  Plan Discharge plan remains   Co-evaluation                 AM-PAC OT "6 Clicks" Daily Activity     Outcome Measure   Help from another person eating meals?: A Little Help from another  person taking care of personal grooming?: A Little Help from another person toileting, which includes using toliet, bedpan, or urinal?: Total Help from another person bathing (including washing, rinsing, drying)?: Total Help from another person to put on and taking off regular upper body clothing?: A Lot Help from another person to put on and taking off regular lower body clothing?: Total 6 Click Score: 11    End of Session Equipment Utilized During Treatment: Oxygen  OT Visit Diagnosis: Muscle weakness (generalized) (M62.81);Other symptoms and signs involving cognitive function   Activity Tolerance Patient limited by fatigue   Patient Left in bed;with call bell/phone within reach;with bed alarm set   Nurse Communication          Time: [phone] OT Time Calculation (min): 16 min  Charges: OT General Charges $OT Visit: 1 Visit OT Treatments $Self Care/Home Management : 8-22 mins  Martie Round, OTR/L Acute Rehabilitation Services Pager: [phone] Office: [phone]    Evern Bio 07/23/2021, 10:34 AM

## 2021-07-23 NOTE — Plan of Care (Signed)
?  Problem: Activity: ?Goal: Capacity to carry out activities will improve ?Outcome: Progressing ?  ?

## 2021-07-23 NOTE — Progress Notes (Signed)
Pts. HR 130s-140s sustaining. Pt. Asymptomatic and resting in bed. On call for IMTS paged to make aware and at bedside. PT. HR down to low 100s when MD on the unit. RN told to monitor and contact IMTS if HR elevated.

## 2021-07-23 NOTE — Progress Notes (Signed)
ANTICOAGULATION CONSULT NOTE  Pharmacy Consult:  Coumadin Indication: atrial fibrillation  No Known Allergies  Patient Measurements: Height: 5\' 4"  (162.6 cm) Weight: 71.9 kg (158 lb 8.2 oz) IBW/kg (Calculated) : 54.7  Vital Signs: Temp: 98.1 F (36.7 C) (09/09 0450) Temp Source: Oral (09/09 0450) BP: 102/60 (09/09 0900) Pulse Rate: 90 (09/09 0900)  Labs: Recent Labs    07/21/21 0138 07/22/21 0316 07/23/21 0336  HGB 10.8* 11.2* 11.6*  HCT 33.7* 35.2* 36.2  PLT 178 198 238  LABPROT 22.1* 22.6* 29.6*  INR 1.9* 2.0* 2.8*  CREATININE 0.84 0.92 0.98     Estimated Creatinine Clearance: 44.5 mL/min (by C-G formula based on SCr of 0.98 mg/dL).  Assessment: 80 yo F with SOB, CXR with mild pulm edema and small R pleural effusion.  Pharmacy consulted to continue Coumadin from PTA for Afib HR 90-100s on Toprol 100 and Digoxin (level ok 0.4   9/7)  INR 3.9 on admit > fell to 1.9 after being held for 3 days > 2.8 today after boost this week.  Will start warfarin 5mg  daily - slightly lower than PTA dose.  Cbc stable no bleeding noted   PTA regimen: 7.5mg  daily MWF, 5 mg all other days.   Goal of Therapy:  INR 2-3 Monitor platelets by anticoagulation protocol: Yes   Plan:  Coumadin 5mg  PO daily Daily PT / INR   11/7 Pharm.D. CPP, BCPS Clinical Pharmacist [phone] 07/23/2021 9:31 AM

## 2021-07-23 NOTE — Progress Notes (Signed)
Physical Therapy Treatment Patient Details Name: Joanna [name] MRN: [id number] DOB: [birth year] Today's Date: 07/23/2021    History of Present Illness Pt is an 80 y.o. female who presented 08-15-2021 with increased SOB. Pt admitted with volume overload, acute on chronic right heart failure complicated by mitral and tricuspid regurgitation, as well as atrial fibrillation. She has not been taking Lasix the past few months with noted subsequent lower extremity edema. PMH of HFpEF, severe mitral and tricuspid regurgitation, atrial fibrillation on coumadin, HTN, and prior CVAs    PT Comments    Pt supine in bed on arrival.  Pt with B hips turned to the R in bed.  Pt is requiring increased time to mobilize and increased assistance to perform functional tasks.  Pt was able to stand x 2 attempts.  Will continue to recommend rehab at SNF to improve strength and function to decrease caregiver burden before returning home.    Follow Up Recommendations  SNF;Supervision/Assistance - 24 hour     Equipment Recommendations  3in1 (PT)    Recommendations for Other Services       Precautions / Restrictions Precautions Precautions: Fall Precaution Comments: monitor SpO2 Restrictions Weight Bearing Restrictions: No    Mobility  Bed Mobility Overal bed mobility: Needs Assistance Bed Mobility: Rolling;Sidelying to Sit Rolling: Max assist Sidelying to sit: Max assist   Sit to supine: Total assist   General bed mobility comments: Pt with poor ability and strength to roll into sidelying and move into sitting.  Pt with posterior pelvic tilt and poor sitting balance.    Transfers Overall transfer level: Needs assistance Equipment used: Rolling walker (2 wheeled) Transfers: Sit to/from Stand Sit to Stand: Mod assist;From elevated surface         General transfer comment: Pt performed x 2 reps of standing with poor ability to maintain for any length of time on 2nd trial.  Pt able to stand around 10  sec during 1st attempt.  Ambulation/Gait Ambulation/Gait assistance:  (unable to progress to gt training due to weakness and poor balance.)               Stairs             Wheelchair Mobility    Modified Rankin (Stroke Patients Only)       Balance Overall balance assessment: Needs assistance Sitting-balance support: Feet supported;Bilateral upper extremity supported Sitting balance-Leahy Scale: Poor       Standing balance-Leahy Scale: Poor Standing balance comment: Requiring RW and min-mod A with posterior lean                            Cognition Arousal/Alertness: Awake/alert Behavior During Therapy: Flat affect Overall Cognitive Status: Impaired/Different from baseline Area of Impairment: Attention;Memory;Following commands;Safety/judgement;Awareness;Problem solving                   Current Attention Level: Sustained Memory: Decreased short-term memory Following Commands: Follows one step commands with increased time;Follows one step commands inconsistently Safety/Judgement: Decreased awareness of safety;Decreased awareness of deficits Awareness: Emergent Problem Solving: Slow processing;Decreased initiation;Difficulty sequencing;Requires verbal cues;Requires tactile cues General Comments: making requests, but then states no when requests are fulfilled      Exercises      General Comments        Pertinent Vitals/Pain Pain Assessment: Faces Faces Pain Scale: Hurts little more Pain Location: generalized discomfort. Pain Intervention(s): Monitored during session;Repositioned    Home Living  Prior Function            PT Goals (current goals can now be found in the care plan section) Acute Rehab PT Goals Patient Stated Goal: per family unable to take patient home until care team is in place. Potential to Achieve Goals: Good Progress towards PT goals: Progressing toward goals     Frequency    Min 2X/week      PT Plan Current plan remains appropriate    Co-evaluation              AM-PAC PT "6 Clicks" Mobility   Outcome Measure  Help needed turning from your back to your side while in a flat bed without using bedrails?: A Lot Help needed moving from lying on your back to sitting on the side of a flat bed without using bedrails?: A Lot Help needed moving to and from a bed to a chair (including a wheelchair)?: A Lot Help needed standing up from a chair using your arms (e.g., wheelchair or bedside chair)?: A Lot Help needed to walk in hospital room?: Total Help needed climbing 3-5 steps with a railing? : Total 6 Click Score: 10    End of Session Equipment Utilized During Treatment: Gait belt;Oxygen Activity Tolerance: Treatment limited secondary to medical complications (Comment);Patient limited by fatigue;Patient limited by lethargy Patient left: with call bell/phone within reach;in bed;with bed alarm set Nurse Communication: Mobility status PT Visit Diagnosis: Unsteadiness on feet (R26.81);Other abnormalities of gait and mobility (R26.89);Muscle weakness (generalized) (M62.81);Difficulty in walking, not elsewhere classified (R26.2)     Time: 9937-1696 PT Time Calculation (min) (ACUTE ONLY): 19 min  Charges:  $Therapeutic Activity: 8-22 mins                     Bonney Leitz , PTA Acute Rehabilitation Services Pager [phone] Office [phone]    Joanna [name] 07/23/2021, 4:26 PM

## 2021-07-23 NOTE — TOC Progression Note (Addendum)
Transition of Care [facility]) - Progression Note    Patient Details  Name: MINNIE LEGROS MRN: [id number] Date of Birth: 28-May-1941  Transition of Care [facility] CM/SW Contact  Leone Haven, RN Phone Number: 07/23/2021, 2:31 PM  Clinical Narrative:    Per MD daughter is feeling over whelmed about taking patient home.  NCM spoke with daughter, she states there will be no one at the house to care foir patient.  NCM informed her that she can go to Medicare.gov site to look up private duty services and call to find out what services they provide.    NCM also informed her that patient may need to go to SNF which is what the pt eval recommendations were on 9/7 and they can add palliative services to that as well, and while she is in the facility getting rehab the family can work on getting private care set up for her.  NCM informed Symone CSW regarding this information  and MD and Wallis Bamberg with [employer].  PT and OT will see patient today.  CSW to follow up on the weekend for SNF placement.   Expected Discharge Plan: Skilled Nursing Facility Barriers to Discharge: Continued Medical Work up  Expected Discharge Plan and Services Expected Discharge Plan: Skilled Nursing Facility In-house Referral: Clinical Social Work     Living arrangements for the past 2 months: Single Family Home                                       Social Determinants of Health (SDOH) Interventions    Readmission Risk Interventions No flowsheet data found.

## 2021-07-23 NOTE — Progress Notes (Addendum)
Civil engineer, contracting ([employer])  DME has been delivered and is in the home.  Thank you, Wallis Bamberg RN, BSN, CCRN [facility] Liaison   **addendum, updated by [facility] manager that family does not feel they can care for her with her current needs and felt that hospice would be providing more care than hospice can provide. Likely, pt will go to a SNF for rehab and then try to dc home with hospice.  [employer] will continue to follow.

## 2021-07-24 DIAGNOSIS — I482 Chronic atrial fibrillation, unspecified: Secondary | ICD-10-CM | POA: Diagnosis not present

## 2021-07-24 DIAGNOSIS — I50813 Acute on chronic right heart failure: Secondary | ICD-10-CM | POA: Diagnosis not present

## 2021-07-24 DIAGNOSIS — Z7189 Other specified counseling: Secondary | ICD-10-CM | POA: Diagnosis not present

## 2021-07-24 LAB — PROTIME-INR
INR: 4 — ABNORMAL HIGH (ref 0.8–1.2)
Prothrombin Time: 38.6 seconds — ABNORMAL HIGH (ref 11.4–15.2)

## 2021-07-24 LAB — BASIC METABOLIC PANEL
Anion gap: 10 (ref 5–15)
BUN: 33 mg/dL — ABNORMAL HIGH (ref 8–23)
CO2: 30 mmol/L (ref 22–32)
Calcium: 9 mg/dL (ref 8.9–10.3)
Chloride: 93 mmol/L — ABNORMAL LOW (ref 98–111)
Creatinine, Ser: 1.07 mg/dL — ABNORMAL HIGH (ref 0.44–1.00)
GFR, Estimated: 53 mL/min — ABNORMAL LOW (ref >60)
Glucose, Bld: 111 mg/dL — ABNORMAL HIGH (ref 70–99)
Potassium: 5.1 mmol/L (ref 3.5–5.1)
Sodium: 133 mmol/L — ABNORMAL LOW (ref 135–145)

## 2021-07-24 NOTE — Plan of Care (Signed)

## 2021-07-24 NOTE — Progress Notes (Signed)
HD#7 SUBJECTIVE:  Patient Summary: Joanna [name] is a 80 y.o. with a pertinent PMH of valvular atrial fibrillatin, chronic right heart failure with mitral and tricuspid regurgitation, prior CVA's, who presented with increasing shortness of breath and leg swelling and admitted for acute on chronic heart failure.   Overnight Events: Patient states that she is rested well overnight.  She denies shortness of breath, abdominal pain, and chest pain.  She appears more tired and is conversing less than previous days.   OBJECTIVE:  Vital Signs: Vitals:   07/23/21 1112 07/23/21 1629 07/23/21 2051 07/24/21 0342  BP: 114/87 (!) 129/91 128/84 116/77  Pulse: 94 88 100 (!) 108  Resp: 18  16 17   Temp: 98.1 F (36.7 C)  98.6 F (37 C) (!) 97.5 F (36.4 C)  TempSrc: Oral  Oral Oral  SpO2: 100%  100% 100%  Weight:    66.2 kg  Height:       Supplemental O2: Nasal Cannula SpO2: 100 % O2 Flow Rate (L/min): 2 L/min  Filed Weights   07/22/21 0506 07/23/21 0450 07/24/21 0342  Weight: 72 kg 71.9 kg 66.2 kg     Intake/Output Summary (Last 24 hours) at 07/24/2021 0548 Last data filed at 07/23/2021 1630 Gross per 24 hour  Intake --  Output 400 ml  Net -400 ml   Net IO Since Admission: -6,013 mL [07/24/21 0548]  Physical Exam: Constitutional: Chronically ill pale appearing elderly woman HENT: NCAT Eyes: no Scleral icterus, conjunctiva clear CardioVascular: 3 out of 6 holosystolic murmur, no rubs, or gallops, irregularly irregular mildly tachycardic Pulmonary: Clear to auscultation bilaterally, slightly tachypneic with use of accessory muscles GI: No tenderness, bowel sounds present MSK: No pitting edema in legs bilaterally with wrinkling, chronic venous stasis changes Psych: Normal mood and affect, she is less conversant today  Patient Lines/Drains/Airways Status     Active Line/Drains/Airways     Name Placement date Placement time Site Days   Peripheral IV 30-Jul-2021 20 G Left  Antecubital 07/30/2021  1556  Antecubital  8   External Urinary Catheter 07/22/21  1250  --  2            Pertinent Labs: CBC Latest Ref Rng & Units 07/23/2021 07/22/2021 07/21/2021  WBC 4.0 - 10.5 K/uL 6.8 5.5 4.8  Hemoglobin 12.0 - 15.0 g/dL 11.6(L) 11.2(L) 10.8(L)  Hematocrit 36.0 - 46.0 % 36.2 35.2(L) 33.7(L)  Platelets 150 - 400 K/uL 238 198 178    CMP Latest Ref Rng & Units 07/24/2021 07/23/2021 07/22/2021  Glucose 70 - 99 mg/dL 09/21/2021) 96 952(W)  BUN 8 - 23 mg/dL 413(K) 44(W) 20  Creatinine 0.44 - 1.00 mg/dL 10(U) 7.25(D 6.64  Sodium 135 - 145 mmol/L 133(L) 133(L) 133(L)  Potassium 3.5 - 5.1 mmol/L 5.1 5.1 5.1  Chloride 98 - 111 mmol/L 93(L) 97(L) 97(L)  CO2 22 - 32 mmol/L 30 25 29   Calcium 8.9 - 10.3 mg/dL 9.0 4.03) 8.9  Total Protein 6.0 - 8.5 g/dL - - -  Total Bilirubin 0.0 - 1.2 mg/dL - - -  Alkaline Phos 39 - 117 IU/L - - -  AST 0 - 40 IU/L - - -  ALT 0 - 32 IU/L - - -    No results for input(s): GLUCAP in the last 72 hours.   Pertinent Imaging: DG Chest Port 1 View   Result Date: 07/18/2021 CLINICAL DATA:  Chest pain and shortness of breath EXAM: PORTABLE CHEST 1 VIEW COMPARISON:  Radiograph Jul 30, 2021, chest  CT 10/07/2018 FINDINGS: Unchanged, markedly enlarged cardiac silhouette. Unchanged mild pulmonary edema. Decreased small right pleural effusion. No visible pneumothorax. Bones are unchanged. IMPRESSION: Unchanged marked cardiomegaly with pulmonary vascular congestion/mild edema. Decreased small right pleural effusion. Electronically Signed   By: Caprice Renshaw M.D.   On: 07/18/2021 11:06     Echocardiogram from 07/17/2021:    1. Left ventricular ejection fraction, by estimation, is 55 to 60%. The  left ventricle has normal function. The left ventricle has no regional  wall motion abnormalities. Left ventricular diastolic parameters are  consistent with Grade I diastolic  dysfunction (impaired relaxation). There is the interventricular septum is  flattened in systole and  diastole, consistent with right ventricular  pressure and volume overload.   2. Right ventricular systolic function is mildly reduced. The right  ventricular size is severely enlarged. There is normal pulmonary artery  systolic pressure. The estimated right ventricular systolic pressure is  34.9 mmHg.   3. Left atrial size was severely dilated.   4. Right atrial size was massively dilated.   5. A small pericardial effusion is present. The pericardial effusion is  localized near the right atrium.   6. The mitral valve is normal in structure. Moderate to severe mitral  valve regurgitation. No evidence of mitral stenosis.   7. Tricuspid valve regurgitation is severe.   8. The aortic valve is normal in structure. Aortic valve regurgitation is  not visualized. No aortic stenosis is present.   9. Pulmonic valve regurgitation is moderate.  10. The inferior vena cava is dilated in size with <50% respiratory  variability, suggesting right atrial pressure of 15 mmHg.  ASSESSMENT/PLAN:  Assessment: Principal Problem:   Acute on chronic right heart failure (HCC) Active Problems:   Goals of care, counseling/discussion   Chronic atrial fibrillation   Mitral valve regurgitation   Plan: #Acute on Chronic HF exacerbation with preserved EF #Severe mitral regurgitation and tricuspid regurgitation Patient presented due to 2 weeks of increasing shortness of breath and leg swelling.  She has a known history of severe mitral and tricuspid regurgitation that she has previously refused interventions/procedures on.  Echo showed preserved EF with severe mitral and tricuspid regurgitation as seen on previous Echo.  During admission, patient has had lower blood pressures with diuresis.  This is due to preload dependence in the setting of right heart failure.  Cardiology consulted and stopped home medication of diltiazem 120 mg and changed it to digoxin.  This allowed [place] to continue with diuresis.  IV Lasix  switched to p.o. Lasix.  Patient had intermittent episodes of Atrial Fibrillation with rates into the 140s.  Increased metoprolol from 50 mg to 100 mg to help with rate control.  Patient's dry weight thought to be around 64 kg, on admission she was at 72 kg.  Today's weight is decreased at 66.2 kg. Palliative was consulted to help with questions on next steps.  Patient wishes to go home with hospice.  However family is concerned about providing care at home.  Patient may need to go to rehab for a short-term since the family has time to find a home health aide to help with care at home. -P.o. Lasix 40 mg once daily -home medications: Discontinued Cardizem 120 mg daily and metoprolol succinate 50 mg increased to 100 mg. -Strict I/Os -palliative following   #Valvular atrial fibrillation on coumadin Patient had intermittent runs of atrial fibrillation with rates in the 140s.  We increased metoprolol to help with rate control. TSH  normal. INR 3.9 on arrival, goal 2-3. Appreciate pharmacy's assistance with warfarin dosing.  Today INR at 1.9. - Warfarin per pharmacy - Continue metoprolol succinate 100 mg daily - discontinue Cardizem 120mg     #Macrocytic anemia#thrombocytopenia-resolved Hbg improved to 11.6, Platelet count of 160K-->178K-->238K today.  Folate wnl, Vitamin B-12 wnl    Best Practice: Diet: Cardiac diet IVF: Fluids: none VTE: warfarin Code: Full AB: none Family Contact: [name] and , her daughter and son DISPO: Anticipated discharge in 1-2 days to home with hospice vs SNF pending Medical stability.  Signature: Alinda Money, D.O. Internal Medicine Resident, PGY-1 [facility]  Pager: [phone] 5:48 AM, 07/24/2021   Please contact the on call pager after 5 pm and on weekends at [phone].

## 2021-07-24 NOTE — Progress Notes (Signed)
Daily Progress Note   Patient Name: Joanna [name]       Date: 07/24/2021 DOB: Oct 18, 1941  Age: 80 y.o. MRN#: [id number] Attending Physician: Tyson Alias, * Primary Care Physician: Margot Ables, MD Admit Date: 2021/07/26  Reason for Consultation/Follow-up: Establishing goals of care  Subjective: Medical records reviewed. Patient assessed at the bedside. She denies pain or distress. No family present during my visit.  I then called patient's daughter Joanna [name] and offered support. She notes that she is awaiting a call from [facility] to discuss options. She continues to feel overwhelmed about her decision to proceed with home hospice. Upon clarification, this is due to concerns about the level of support that home hospice can provide. Educated on the difference between visits from aides and RN's and counseled on the availability of aides a few times per week. Discussed frequency of RN visits and the availability of 24/7 help line for concerns. Emphasized that family is still the primary source of care for patients in the home environment. Joanna [name] feels this would be doable for her, although she would still like to arrange for additional private caregivers. She feels that she is equipped to take patient home to meet with an admitting hospice RN and then continue her search for support. She would prefer that her mother avoids SNF if possible.  Questions and concerns addressed. PMT will continue to support holistically.  Length of Stay: 7  Current Medications: Scheduled Meds:   digoxin  0.125 mg Oral Daily   furosemide  40 mg Oral Daily   metoprolol succinate  100 mg Oral QPM   polyethylene glycol  17 g Oral Daily   sodium chloride flush  3 mL Intravenous Once   warfarin  5 mg Oral  q1600   Warfarin - Pharmacist Dosing Inpatient   Does not apply q1600    Continuous Infusions:   PRN Meds: acetaminophen **OR** acetaminophen, Gerhardt's butt cream, ondansetron (ZOFRAN) IV, senna-docusate  Physical Exam Vitals and nursing note reviewed.  Constitutional:      General: She is not in acute distress. Cardiovascular:     Rate and Rhythm: Normal rate.  Pulmonary:     Effort: Pulmonary effort is normal.  Skin:    General: Skin is warm and dry.  Neurological:     Mental Status: She is alert.  Vital Signs: BP 137/80 (BP Location: Left Arm)   Pulse (!) 106   Temp 98 F (36.7 C) (Oral)   Resp 20   Ht 5\' 4"  (1.626 m)   Wt 66.2 kg   SpO2 100%   BMI 25.05 kg/m  SpO2: SpO2: 100 % O2 Device: O2 Device: Nasal Cannula O2 Flow Rate: O2 Flow Rate (L/min): 2 L/min  Intake/output summary:  Intake/Output Summary (Last 24 hours) at 07/24/2021 1015 Last data filed at 07/24/2021 09/23/2021 Gross per 24 hour  Intake --  Output 450 ml  Net -450 ml    LBM: Last BM Date: 07/22/21 Baseline Weight: Weight: 74.4 kg Most recent weight: Weight: 66.2 kg       Palliative Assessment/Data: 40-50%      Patient Active Problem List   Diagnosis Date Noted   Mitral valve regurgitation 07/17/2021   Acute on chronic right heart failure (HCC) 08-06-2021   Chronic kidney disease    Hypertension    Chronic atrial fibrillation 10/26/2015   Goals of care, counseling/discussion 01/03/2014   Left ventricular dysfunction    Atrial fibrillation (HCC) 02/04/2011    Palliative Care Assessment & Plan   Patient Profile: 80 y.o. female  with past medical history of  structural heart disease, valvular atrial fibrillation, mitral regurgitation, tricuspid regurgitation, right heart dysfunction, and ischemic vascular disease with prior cerebral infarctions, permanent a-fib admitted on Aug 06, 2021 with SOB, chest pain and leg swelling in the setting of not taking her lasix at home. She is  admitted and being treated for heart failure exacerbation with severe mitral and severe tricuspid regurgitation.  She in not a candidate nor does she choose to have further invasive tx for her heart failure. Palliative medicine consulted for  "Right Heart Failure with acute exacerbationRight Heart Failure with acute exacerbation.  Difficulty with diuresis due to pre-load dependence.  Patient states she does not want further interventions." This is her first hospitalization for this problem since 2019  Assessment: Acute on chronic HF exacerbation Severe MR and TR Atrial fibrillation Goals of car conversation  Recommendations/Plan: Patient's daughter prefers to avoid SNF and discharge patient home with hospice; she prefers to have a hospice RN present when patient arrives Psychosocial and emotional support provided Ongoing support from PMT  Goals of Care and Additional Recommendations: Limitations on Scope of Treatment: Avoid Hospitalization  Prognosis:  < 6 months  Discharge Planning: Home with Hospice  Care plan was discussed with patient, patient's daughter 2020  Total time: 15 minutes Greater than 50% of this time was spent in counseling and coordinating care related to the above assessment and plan.  Joanna Rhodes, PA-C Palliative Medicine Team Team phone # [phone]  Thank you for allowing the Palliative Medicine Team to assist in the care of this patient. Please utilize secure chat with additional questions, if there is no response within 30 minutes please call the above phone number.  Palliative Medicine Team providers are available by phone from 7am to 7pm daily and can be reached through the team cell phone.  Should this patient require assistance outside of these hours, please call the patient's attending physician.

## 2021-07-24 NOTE — Progress Notes (Signed)
ANTICOAGULATION CONSULT NOTE - Follow Up Consult  Pharmacy Consult for warfarin Indication: atrial fibrillation  No Known Allergies  Patient Measurements: Height: 5\' 4"  (162.6 cm) Weight: 66.2 kg (145 lb 15.1 oz) IBW/kg (Calculated) : 54.7  Vital Signs: Temp: 98 F (36.7 C) (09/10 0740) Temp Source: Oral (09/10 0740) BP: 137/80 (09/10 0738) Pulse Rate: 106 (09/10 0738)  Labs: Recent Labs    07/22/21 0316 07/23/21 0336 07/24/21 0349  HGB 11.2* 11.6*  --   HCT 35.2* 36.2  --   PLT 198 238  --   LABPROT 22.6* 29.6* 38.6*  INR 2.0* 2.8* 4.0*  CREATININE 0.92 0.98 1.07*    Estimated Creatinine Clearance: 39.3 mL/min (A) (by C-G formula based on SCr of 1.07 mg/dL (H)).   Assessment: 80 yo F with SOB, CXR with mild pulm edema and small R pleural effusion.  Pharmacy consulted to continue Coumadin from PTA for Afib HR 90-100s on Toprol 100 and Digoxin (level ok 0.4 9/7)   INR 3.9 on admit > fell to 1.9 after being held for 3 days > 2.8 today after boost this week. Started warfarin 5 mg daily. Most recent INR above goal at 4.   PTA regimen: 7.5mg  daily MWF, 5 mg all other days.   Goal of Therapy:  INR 2-3 Monitor platelets by anticoagulation protocol: Yes   Plan:  Hold warfarin dose for 9/10 Daily PT/INR Monitor for s/sx of bleeding  Thank you for including pharmacy in the care of this patient.  11/7, PharmD PGY1 Acute Care Pharmacy Resident  Phone: [phone] 07/24/2021  1:10 PM  Please check [email or web address] for unit-specific pharmacy phone numbers.

## 2021-07-25 DIAGNOSIS — I50813 Acute on chronic right heart failure: Secondary | ICD-10-CM | POA: Diagnosis not present

## 2021-07-25 LAB — CBC
HCT: 36.5 % (ref 36.0–46.0)
Hemoglobin: 11.6 g/dL — ABNORMAL LOW (ref 12.0–15.0)
MCH: 33.2 pg (ref 26.0–34.0)
MCHC: 31.8 g/dL (ref 30.0–36.0)
MCV: 104.6 fL — ABNORMAL HIGH (ref 80.0–100.0)
Platelets: 307 10*3/uL (ref 150–400)
RBC: 3.49 MIL/uL — ABNORMAL LOW (ref 3.87–5.11)
RDW: 14 % (ref 11.5–15.5)
WBC: 7.4 10*3/uL (ref 4.0–10.5)
nRBC: 0 % (ref 0.0–0.2)

## 2021-07-25 LAB — PROTIME-INR
INR: 5.3 (ref 0.8–1.2)
Prothrombin Time: 48.9 seconds — ABNORMAL HIGH (ref 11.4–15.2)

## 2021-08-14 NOTE — Discharge Summary (Addendum)
  Name: Joanna [name] MRN: [id number] DOB: 29-Jan-1941 80 y.o.  Date of Admission: 29-Jul-2021  3:54 PM Date of Death: 08-07-21 Attending Physician: Dr. Carlynn Purl  Discharge Diagnosis: Principal Problem:   Acute on chronic right heart failure [facility]) Active Problems:   Goals of care, counseling/discussion   Chronic atrial fibrillation   Mitral valve regurgitation  Cause of death: Heart failure Time of death: 14:33, Aug 07, 2021  Disposition and follow-up:   [name] was discharged from [facility] in expired condition.    Hospital Course: Patient presented due to 2 weeks of increasing shortness of breath and leg swelling.  She had a known history of severe mitral and tricuspid regurgitation that she had previously refused interventions/procedures on.  Echo showed preserved ejection fraction with severe mitral and tricuspid regurgitation as seen on previous echo.  On admission patient's weight was increased to 72 kg from her dry weight of 64 kg.  Throughout admission, diuresis was difficult due to preload dependence.  Cardiology consulted to help optimize medical management and to see if other options are available.  Due to progression of right heart failure, surgery was not deemed to be helpful at this point.  Several medication's were changed to help optimize diuresis.  This included discontinuing diltiazem to allow for more room within the blood pressure.  She also experienced several rounds of atrial fibrillation with rates in the 140s.  Metoprolol was increased from 50 mg to 100 mg to help with rate control, and patient was able to tolerate this.  Skilled therapy recommended skilled nursing facility.  However patient stated that she wanted to go home.  Medicine was consulted to help clarify next steps with patient and her family and patient elected to proceed with home hospice. Patient was ready for discharge on 9/9, but family requested a few more days in order to  properly set up equipment at home. Over the last few days of hospitalization, mentation seemed to be worsening. Patient was subsequently pronounced dead on 08/07/2021 at 14:33.  Signed: Evlyn Kanner, MD 2021-08-07, 3:01 PM

## 2021-08-14 NOTE — Progress Notes (Signed)
ANTICOAGULATION CONSULT NOTE - Follow Up Consult  Pharmacy Consult for warfarin Indication: atrial fibrillation  No Known Allergies  Patient Measurements: Height: 5\' 4"  (162.6 cm) Weight: 65.6 kg (144 lb 10 oz) IBW/kg (Calculated) : 54.7  Vital Signs: Temp: 97.3 F (36.3 C) (09/11 0743) Temp Source: Oral (09/11 0743) BP: 117/87 (09/11 0743) Pulse Rate: 85 (09/11 0743)  Labs: Recent Labs    07/23/21 0336 07/24/21 0349 2021-08-18 0225  HGB 11.6*  --  11.6*  HCT 36.2  --  36.5  PLT 238  --  307  LABPROT 29.6* 38.6* 48.9*  INR 2.8* 4.0* 5.3*  CREATININE 0.98 1.07*  --      Estimated Creatinine Clearance: 36.2 mL/min (A) (by C-G formula based on SCr of 1.07 mg/dL (H)).   Assessment: 80 yo F with SOB, CXR with mild pulm edema and small R pleural effusion.  Pharmacy consulted to continue Coumadin from PTA for Afib HR 90-100s on Toprol 100 and Digoxin (level ok 0.4 9/7)   INR 3.9 on admit > fell to 1.9 after being held for 3 days > 2.8 today after boost this week. Started warfarin 5 mg daily, but dose for 9/10 held due to elevated INR. Most recent INR above goal at 5.3. No bleeding reported by RN. Will continue to hold and monitor.  PTA regimen: 7.5mg  daily MWF, 5 mg all other days.   Goal of Therapy:  INR 2-3 Monitor platelets by anticoagulation protocol: Yes   Plan:  Hold warfarin dose for 9/11 Daily PT/INR Monitor for s/sx of bleeding  Thank you for including pharmacy in the care of this patient.  11/11, PharmD PGY1 Acute Care Pharmacy Resident  Phone: [phone] 08-18-21  8:59 AM  Please check [email or web address] for unit-specific pharmacy phone numbers.

## 2021-08-14 NOTE — Progress Notes (Signed)
INR 5.3 this am, MD notified, will continue to monitor, Thanks Lavonda Jumbo RN.

## 2021-08-14 NOTE — Plan of Care (Signed)
  Problem: Safety: Goal: Ability to remain free from injury will improve Outcome: Completed/Met   Problem: Pain Managment: Goal: General experience of comfort will improve Outcome: Completed/Met   Problem: Elimination: Goal: Will not experience complications related to urinary retention Outcome: Completed/Met   

## 2021-08-14 NOTE — Progress Notes (Signed)
HD#8 SUBJECTIVE:  Patient Summary: Joanna [name] is a 80 y.o. with a pertinent PMH of valvular atrial fibrillatin, chronic right heart failure with mitral and tricuspid regurgitation, prior CVA's, who presented with increasing shortness of breath and leg swelling and admitted for acute on chronic heart failure.     Overnight Events: Denies shortness of breath, abdominal pain, and chest pain. Resting in bed with nasal cannula in place.    OBJECTIVE:  Vital Signs: Vitals:   07/24/21 2023 08/21/21 0418 2021-08-21 0600 08-21-2021 0743  BP: 133/85 117/66  117/87  Pulse: (!) 103 78  85  Resp: 16 17  (!) 25  Temp: (!) 97.5 F (36.4 C) 97.8 F (36.6 C)  (!) 97.3 F (36.3 C)  TempSrc: Oral Oral  Oral  SpO2: 98% 100% 95% 100%  Weight:  65.6 kg    Height:       Supplemental O2: Nasal Cannula SpO2: 100 % O2 Flow Rate (L/min): 3 L/min  Filed Weights   07/23/21 0450 07/24/21 0342 Aug 21, 2021 0418  Weight: 71.9 kg 66.2 kg 65.6 kg     Intake/Output Summary (Last 24 hours) at 2021/08/21 6301 Last data filed at 08-21-2021 0600 Gross per 24 hour  Intake 200 ml  Output 1 ml  Net 199 ml   Net IO Since Admission: -6,014 mL [08/21/2021 0929]  Physical Exam: Constitutional: Chronically ill pale appearing elderly woman HENT: NCAT Eyes: no Scleral icterus, conjunctiva clear CardioVascular: 3 out of 6 holosystolic murmur, no rubs, or gallops, irregularly irregular mildly tachycardic Pulmonary: Clear to auscultation bilaterally, slightly tachypneic with use of accessory muscles GI: No tenderness, bowel sounds present MSK: No pitting edema in legs bilaterally with wrinkling, chronic venous stasis changes Psych: Normal mood and affect, she is less conversant today Patient Lines/Drains/Airways Status     Active Line/Drains/Airways     Name Placement date Placement time Site Days   Peripheral IV 07/15/2021 20 G Left Antecubital 08/03/2021  1556  Antecubital  9   External Urinary Catheter 07/22/21   1250  --  3            Pertinent Labs: CBC Latest Ref Rng & Units August 21, 2021 07/23/2021 07/22/2021  WBC 4.0 - 10.5 K/uL 7.4 6.8 5.5  Hemoglobin 12.0 - 15.0 g/dL 11.6(L) 11.6(L) 11.2(L)  Hematocrit 36.0 - 46.0 % 36.5 36.2 35.2(L)  Platelets 150 - 400 K/uL 307 238 198    CMP Latest Ref Rng & Units 07/24/2021 07/23/2021 07/22/2021  Glucose 70 - 99 mg/dL 601(U) 96 932(T)  BUN 8 - 23 mg/dL 55(D) 32(K) 20  Creatinine 0.44 - 1.00 mg/dL 0.25(K) 2.70 6.23  Sodium 135 - 145 mmol/L 133(L) 133(L) 133(L)  Potassium 3.5 - 5.1 mmol/L 5.1 5.1 5.1  Chloride 98 - 111 mmol/L 93(L) 97(L) 97(L)  CO2 22 - 32 mmol/L 30 25 29   Calcium 8.9 - 10.3 mg/dL 9.0 ) 8.9  Total Protein 6.0 - 8.5 g/dL - - -  Total Bilirubin 0.0 - 1.2 mg/dL - - -  Alkaline Phos 39 - 117 IU/L - - -  AST 0 - 40 IU/L - - -  ALT 0 - 32 IU/L - - -    No results for input(s): GLUCAP in the last 72 hours.   Pertinent Imaging: No results found.  ASSESSMENT/PLAN:  Assessment: Principal Problem:   Acute on chronic right heart failure (HCC) Active Problems:   Goals of care, counseling/discussion   Chronic atrial fibrillation   Mitral valve regurgitation   Plan: #  Acute on chronic right heart failure with preserved EF#Severe mitral regurgitation and tricuspid regurgitation Diuresis has been challenging in the setting of preload dependence with high right heart failure.  Medications were optimized to allow for diuresis and to help control episodes of atrial fibrillation with RVR.  Patient's blood pressures have been stable with those changes and no further episodes of RVR.  Pulse between 87-125.  Patient weighs 6 kg today, dry weight of 64.  Patient has elected to go home with hospice.  Her daughter is working with [facility] for equipment needs and it was delivered on Friday.  However, her daughter, [name] stated that she was not comfortable taking her mother home yet and requested time to find additional resources including hiring a  home health aide. On Saturday, [name] agreed that she would be comfortable to have the patient discharged on Monday.  On exam, Ms. Weigand appears weaker and is less talkative. -Home with hospice -home medications: Discontinued Cardizem 120 mg daily and metoprolol succinate 50 mg increased to 100 mg. -P.o. Lasix 40 mg once daily  #Valvular atrial fibrillation on coumadin INR elevated at 5.3 today, will hold -warfarin per pharmacy -Continue metoprolol succinate 100 mg daily  Best Practice: Diet: Cardiac diet IVF: None VTE: Warfarin Code: DNR Family Contact: [name] and Alinda Money, her daughter and son DISPO: Anticipated discharge tomorrow to Home pending Decreased caregiver support.  Signature: Rudene Christians, D.O. Internal Medicine Resident, PGY-1 [facility]  Pager: [phone] 9:29 AM, 07/29/21   Please contact the on call pager after 5 pm and on weekends at [phone].

## 2021-08-14 NOTE — Progress Notes (Signed)
Civil engineer, contracting [facility]) Hospital Liaison: RN note     Chart and patient information have been reviewed by [facility] physician. Hospice eligibility confirmed.  Per handoff plan is for discharge to home on Monday with an admission visit set for 7pm in the home. Plan discussed with TOC Kristi.  Please send signed and completed DNR form home with patient/family.   Please provide prescriptions for mediations at discharge to ensure comfort until patient can be admitted onto hospice services.   DME has been delivered to the home.  [facility] aware of the above. Please notify [facility] when patient is ready to leave the unit at discharge. (Call [phone] or [phone] after 5pm.)    Please do not hesitate to call with questions.   Thank you for the opportunity to participate in this patient's care.  Gillian Scarce, BSN, RN       [facility] Liaison (listed on AMION under Hospice /[phone]  [phone] (24h on call)

## 2021-08-14 DEATH — deceased
